# Patient Record
Sex: Female | Born: 1965 | Race: Black or African American | Hispanic: No | Smoking: Current every day smoker
Health system: Southern US, Community
[De-identification: ages and names within clinical notes are randomized; demographics above are authoritative.]

## PROBLEM LIST (undated history)

## (undated) DIAGNOSIS — Z8719 Personal history of other diseases of the digestive system: Secondary | ICD-10-CM

## (undated) DIAGNOSIS — F419 Anxiety disorder, unspecified: Secondary | ICD-10-CM

## (undated) DIAGNOSIS — M109 Gout, unspecified: Secondary | ICD-10-CM

## (undated) DIAGNOSIS — R7989 Other specified abnormal findings of blood chemistry: Secondary | ICD-10-CM

## (undated) DIAGNOSIS — Z8659 Personal history of other mental and behavioral disorders: Secondary | ICD-10-CM

## (undated) DIAGNOSIS — R768 Other specified abnormal immunological findings in serum: Secondary | ICD-10-CM

## (undated) DIAGNOSIS — K760 Fatty (change of) liver, not elsewhere classified: Secondary | ICD-10-CM

## (undated) DIAGNOSIS — D126 Benign neoplasm of colon, unspecified: Secondary | ICD-10-CM

## (undated) DIAGNOSIS — R945 Abnormal results of liver function studies: Secondary | ICD-10-CM

## (undated) DIAGNOSIS — I2510 Atherosclerotic heart disease of native coronary artery without angina pectoris: Secondary | ICD-10-CM

## (undated) DIAGNOSIS — K635 Polyp of colon: Secondary | ICD-10-CM

## (undated) DIAGNOSIS — R7689 Other specified abnormal immunological findings in serum: Secondary | ICD-10-CM

## (undated) DIAGNOSIS — I10 Essential (primary) hypertension: Secondary | ICD-10-CM

## (undated) DIAGNOSIS — R51 Headache: Secondary | ICD-10-CM

## (undated) DIAGNOSIS — E785 Hyperlipidemia, unspecified: Secondary | ICD-10-CM

## (undated) DIAGNOSIS — F32A Depression, unspecified: Secondary | ICD-10-CM

## (undated) DIAGNOSIS — Z720 Tobacco use: Secondary | ICD-10-CM

## (undated) DIAGNOSIS — F329 Major depressive disorder, single episode, unspecified: Secondary | ICD-10-CM

## (undated) DIAGNOSIS — T7840XA Allergy, unspecified, initial encounter: Secondary | ICD-10-CM

## (undated) DIAGNOSIS — K5790 Diverticulosis of intestine, part unspecified, without perforation or abscess without bleeding: Secondary | ICD-10-CM

## (undated) DIAGNOSIS — E041 Nontoxic single thyroid nodule: Secondary | ICD-10-CM

## (undated) DIAGNOSIS — D3500 Benign neoplasm of unspecified adrenal gland: Secondary | ICD-10-CM

## (undated) DIAGNOSIS — K219 Gastro-esophageal reflux disease without esophagitis: Secondary | ICD-10-CM

## (undated) HISTORY — DX: Atherosclerotic heart disease of native coronary artery without angina pectoris: I25.10

## (undated) HISTORY — DX: Nontoxic single thyroid nodule: E04.1

## (undated) HISTORY — DX: Polyp of colon: K63.5

## (undated) HISTORY — DX: Tobacco use: Z72.0

## (undated) HISTORY — DX: Hyperlipidemia, unspecified: E78.5

## (undated) HISTORY — DX: Depression, unspecified: F32.A

## (undated) HISTORY — DX: Allergy, unspecified, initial encounter: T78.40XA

## (undated) HISTORY — DX: Other specified abnormal findings of blood chemistry: R79.89

## (undated) HISTORY — DX: Gout, unspecified: M10.9

## (undated) HISTORY — DX: Diverticulosis of intestine, part unspecified, without perforation or abscess without bleeding: K57.90

## (undated) HISTORY — DX: Other specified abnormal immunological findings in serum: R76.89

## (undated) HISTORY — DX: Abnormal results of liver function studies: R94.5

## (undated) HISTORY — DX: Other specified abnormal immunological findings in serum: R76.8

## (undated) HISTORY — DX: Gastro-esophageal reflux disease without esophagitis: K21.9

## (undated) HISTORY — DX: Fatty (change of) liver, not elsewhere classified: K76.0

## (undated) HISTORY — DX: Benign neoplasm of colon, unspecified: D12.6

## (undated) HISTORY — DX: Benign neoplasm of unspecified adrenal gland: D35.00

## (undated) HISTORY — DX: Personal history of other mental and behavioral disorders: Z86.59

## (undated) HISTORY — PX: BREAST EXCISIONAL BIOPSY: SUR124

---
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
## 1985-01-19 HISTORY — PX: ANAL FISTULECTOMY: SHX1139

## 1986-01-19 HISTORY — PX: BREAST LUMPECTOMY: SHX2

## 1986-01-19 HISTORY — PX: WRIST GANGLION EXCISION: SUR520

## 1997-06-07 ENCOUNTER — Other Ambulatory Visit: Admission: RE | Admit: 1997-06-07 | Discharge: 1997-06-07 | Payer: Self-pay | Admitting: Internal Medicine

## 1998-01-01 ENCOUNTER — Other Ambulatory Visit: Admission: RE | Admit: 1998-01-01 | Discharge: 1998-01-01 | Payer: Self-pay | Admitting: Gynecology

## 1999-04-17 ENCOUNTER — Other Ambulatory Visit: Admission: RE | Admit: 1999-04-17 | Discharge: 1999-04-17 | Payer: Self-pay | Admitting: Internal Medicine

## 2000-02-25 ENCOUNTER — Other Ambulatory Visit: Admission: RE | Admit: 2000-02-25 | Discharge: 2000-02-25 | Payer: Self-pay | Admitting: Gynecology

## 2001-03-18 ENCOUNTER — Other Ambulatory Visit: Admission: RE | Admit: 2001-03-18 | Discharge: 2001-03-18 | Payer: Self-pay | Admitting: Gynecology

## 2002-03-03 ENCOUNTER — Other Ambulatory Visit: Admission: RE | Admit: 2002-03-03 | Discharge: 2002-03-03 | Payer: Self-pay | Admitting: Gynecology

## 2003-08-02 ENCOUNTER — Other Ambulatory Visit: Admission: RE | Admit: 2003-08-02 | Discharge: 2003-08-02 | Payer: Self-pay | Admitting: Gynecology

## 2005-04-22 ENCOUNTER — Other Ambulatory Visit: Admission: RE | Admit: 2005-04-22 | Discharge: 2005-04-22 | Payer: Self-pay | Admitting: Gynecology

## 2006-09-01 ENCOUNTER — Other Ambulatory Visit: Admission: RE | Admit: 2006-09-01 | Discharge: 2006-09-01 | Payer: Self-pay | Admitting: Gynecology

## 2007-03-30 ENCOUNTER — Encounter: Payer: Self-pay | Admitting: Endocrinology

## 2007-03-31 ENCOUNTER — Encounter: Admission: RE | Admit: 2007-03-31 | Discharge: 2007-03-31 | Payer: Self-pay | Admitting: Gynecology

## 2008-03-18 ENCOUNTER — Emergency Department (HOSPITAL_COMMUNITY): Admission: EM | Admit: 2008-03-18 | Discharge: 2008-03-18 | Payer: Self-pay | Admitting: Emergency Medicine

## 2008-09-21 ENCOUNTER — Encounter: Admission: RE | Admit: 2008-09-21 | Discharge: 2008-09-21 | Payer: Self-pay | Admitting: Gynecology

## 2008-12-24 ENCOUNTER — Encounter: Admission: RE | Admit: 2008-12-24 | Discharge: 2008-12-24 | Payer: Self-pay | Admitting: Endocrinology

## 2010-02-09 ENCOUNTER — Encounter: Payer: Self-pay | Admitting: Gynecology

## 2010-02-09 ENCOUNTER — Encounter: Payer: Self-pay | Admitting: Endocrinology

## 2010-02-10 ENCOUNTER — Encounter: Payer: Self-pay | Admitting: Gynecology

## 2010-05-06 LAB — POCT I-STAT, CHEM 8
BUN: 28 mg/dL — ABNORMAL HIGH (ref 6–23)
Calcium, Ion: 1.11 mmol/L — ABNORMAL LOW (ref 1.12–1.32)
Chloride: 104 mEq/L (ref 96–112)
Creatinine, Ser: 0.8 mg/dL (ref 0.4–1.2)

## 2010-05-06 LAB — CBC
HCT: 41.1 % (ref 36.0–46.0)
Hemoglobin: 13.8 g/dL (ref 12.0–15.0)
MCHC: 33.6 g/dL (ref 30.0–36.0)
MCV: 86.3 fL (ref 78.0–100.0)
RDW: 14.1 % (ref 11.5–15.5)

## 2010-05-06 LAB — POCT CARDIAC MARKERS: Troponin i, poc: <0.05 ng/mL (ref 0.00–0.09)

## 2010-05-06 LAB — DIFFERENTIAL
Basophils Absolute: 0.3 10*3/uL — ABNORMAL HIGH (ref 0.0–0.1)
Eosinophils Absolute: 0.2 10*3/uL (ref 0.0–0.7)
Eosinophils Relative: 2 % (ref 0–5)
Lymphocytes Relative: 36 % (ref 12–46)
Monocytes Absolute: 0.7 10*3/uL (ref 0.1–1.0)

## 2010-07-15 ENCOUNTER — Other Ambulatory Visit: Payer: Self-pay | Admitting: Gynecology

## 2010-07-15 DIAGNOSIS — Z1231 Encounter for screening mammogram for malignant neoplasm of breast: Secondary | ICD-10-CM

## 2010-08-27 ENCOUNTER — Ambulatory Visit
Admission: RE | Admit: 2010-08-27 | Discharge: 2010-08-27 | Disposition: A | Discharge status: Home / Self Care | Payer: 59 | Source: Ambulatory Visit | Attending: Gynecology | Admitting: Gynecology

## 2010-08-27 DIAGNOSIS — Z1231 Encounter for screening mammogram for malignant neoplasm of breast: Secondary | ICD-10-CM

## 2011-02-26 DIAGNOSIS — K118 Other diseases of salivary glands: Secondary | ICD-10-CM

## 2011-02-26 HISTORY — DX: Other diseases of salivary glands: K11.8

## 2011-03-24 ENCOUNTER — Encounter (HOSPITAL_BASED_OUTPATIENT_CLINIC_OR_DEPARTMENT_OTHER): Payer: Self-pay | Admitting: *Deleted

## 2011-03-25 ENCOUNTER — Encounter (HOSPITAL_BASED_OUTPATIENT_CLINIC_OR_DEPARTMENT_OTHER)
Admission: RE | Admit: 2011-03-25 | Discharge: 2011-03-25 | Disposition: A | Discharge status: Home / Self Care | Payer: BC Managed Care – PPO | Source: Ambulatory Visit | Attending: Otolaryngology | Admitting: Otolaryngology

## 2011-03-25 ENCOUNTER — Other Ambulatory Visit: Payer: Self-pay

## 2011-03-25 LAB — BASIC METABOLIC PANEL
CO2: 30 mEq/L (ref 19–32)
Glucose, Bld: 163 mg/dL — ABNORMAL HIGH (ref 70–99)
Potassium: 3.5 mEq/L (ref 3.5–5.1)
Sodium: 136 mEq/L (ref 135–145)

## 2011-03-25 NOTE — H&P (Signed)
  Stacy [name] is an 46 y.o. female.   Chief Complaint: Right parotid mass HPI: Right parotid mass for almost one year.  Past Medical History  Diagnosis Date  . Hypertension   . Diabetes mellitus   . Headache   . H/O hiatal hernia   . Enlarged thyroid     NO MEDS  . Anxiety     Past Surgical History  Procedure Date  . Breast surgery     LT LUMPECTOMY  . Wrist ganglion excision     LT    History reviewed. No pertinent family history. Social History:  reports that she has been smoking.  She does not have any smokeless tobacco history on file. She reports that she drinks alcohol. She reports that she does not use illicit drugs.  Allergies:  Allergies  Allergen Reactions  . Aspirin Rash    No current facility-administered medications on file as of .   Medications Prior to Admission  Medication Sig Dispense Refill  . metFORMIN (GLUCOPHAGE) 500 MG tablet Take 500 mg by mouth 2 (two) times daily with a meal.      . omeprazole (PRILOSEC) 10 MG capsule Take 10 mg by mouth daily.      . valsartan-hydrochlorothiazide (DIOVAN-HCT) 160-12.5 MG per tablet Take 1 tablet by mouth daily.        No results found for this or any previous visit (from the past 48 hour(s)). No results found.  ROS: otherwise negative  Height 5\' 3"  (1.6 m), weight 71.215 kg (157 lb).  PHYSICAL EXAM: Overall appearance:  Healthy appearing, in no distress Head:  Normocephalic, atraumatic. Ears: External auditory canals are clear; tympanic membranes are intact and the middle ears are free of any effusion. Nose: External nose is healthy in appearance. Internal nasal exam free of any lesions or obstruction. Oral Cavity:  There are no mucosal lesions or masses identified. Oral Pharynx/Hypopharynx/Larynx: no signs of any mucosal lesions or masses identified. Neuro:  No identifiable neurologic deficits. Neck: No palpable neck masses. Right parotid mass rubbery and approximately 2 cm.  Studies Reviewed:  none    Assessment/Plan Right parotid mass, recommend parotidectomy.  [name] 03/25/2011, 9:43 AM

## 2011-03-26 ENCOUNTER — Ambulatory Visit (INDEPENDENT_AMBULATORY_CARE_PROVIDER_SITE_OTHER): Payer: BC Managed Care – PPO | Admitting: Cardiology

## 2011-03-26 ENCOUNTER — Encounter: Payer: Self-pay | Admitting: Cardiology

## 2011-03-26 DIAGNOSIS — Z01818 Encounter for other preprocedural examination: Secondary | ICD-10-CM | POA: Insufficient documentation

## 2011-03-26 DIAGNOSIS — F17200 Nicotine dependence, unspecified, uncomplicated: Secondary | ICD-10-CM

## 2011-03-26 DIAGNOSIS — I10 Essential (primary) hypertension: Secondary | ICD-10-CM

## 2011-03-26 DIAGNOSIS — Z720 Tobacco use: Secondary | ICD-10-CM

## 2011-03-26 DIAGNOSIS — E785 Hyperlipidemia, unspecified: Secondary | ICD-10-CM | POA: Insufficient documentation

## 2011-03-26 NOTE — Patient Instructions (Signed)
The current medical regimen is effective;  continue present plan and medications.  Your physician has requested that you have an exercise tolerance test. For further information please visit [email or web address]. Please also follow instruction sheet, as given.   

## 2011-03-26 NOTE — Assessment & Plan Note (Signed)
I deferred her primary provider with a goal LDL less than 100 and HDL greater than 40.

## 2011-03-26 NOTE — Assessment & Plan Note (Signed)
The blood pressure is at target. No change in medications is indicated. We will continue with therapeutic lifestyle changes (TLC).  

## 2011-03-26 NOTE — Progress Notes (Signed)
Dr Ivin Booty reviewed ekg. Would like Dr Pollyann Kennedy notified and pt have cardiology clear for surg.  Spoke with Dr Lucky Rathke nurse Jasmine December.  Advised of situation Jasmine December will notify him in am.  Dr Pollyann Kennedy was not in office at this time.

## 2011-03-26 NOTE — Assessment & Plan Note (Signed)
The patient has a normal EKG. However, she does have some shortness of breath and occasional neck discomfort with emotional stress.  I will bring the patient back for a POET (Plain Old Exercise Test). This will allow me to screen for obstructive coronary disease, risk stratify and very importantly provide a prescription for exercise.  This I can do tomorrow to serve as preoperative screening as well.

## 2011-03-26 NOTE — Progress Notes (Signed)
HPI The patient presents for evaluation of an abnormal EKG. She is scheduled to have a parotidectomy. However, prior to this an EKG demonstrated poor anterior R wave progression and an old anteroseptal infarct could not be excluded. The patient does have significant cardiovascular risk factors. She has not had prior cardiovascular testing other than a distant exercise treadmill test for some atypical pain years ago. She does not describe symptoms such as chest discomfort, arm discomfort. She does not have PND or orthopnea. She does not have palpitations, presyncope or syncope.  She does get dyspneic with activities such as climbing a flight of stairs.  She also mentions that she has occasional right sided neck pain with emotional stress when this is at its peak.  She says she recovers quickly. She's not overly the and does not exercise routinely. She does smoke cigarettes.   Allergies  Allergen Reactions  . Aspirin Rash    Current Outpatient Prescriptions  Medication Sig Dispense Refill  . metFORMIN (GLUCOPHAGE) 500 MG tablet Take 500 mg by mouth 2 (two) times daily with a meal.      . omeprazole (PRILOSEC) 10 MG capsule Take 10 mg by mouth daily.      . valsartan-hydrochlorothiazide (DIOVAN-HCT) 320-25 MG per tablet Take 1 tablet by mouth daily.        Past Medical History  Diagnosis Date  . Hypertension   . Diabetes mellitus   . Headache   . H/O hiatal hernia   . Enlarged thyroid     NO MEDS  . Anxiety   . Hyperlipemia     Past Surgical History  Procedure Date  . Breast surgery     LT LUMPECTOMY  . Wrist ganglion excision     LT    Family History  Problem Relation Age of Onset  . Coronary artery disease Father 52  . Hypertension Mother   . Diabetes Sister     History   Social History  . Marital Status: Single    Spouse Name: N/A    Number of Children: 2  . Years of Education: N/A   Occupational History  .     Social History Main Topics  . Smoking status:  Current Everyday Smoker -- 1.0 packs/day for 28 years    Types: Cigarettes  . Smokeless tobacco: Not on file  . Alcohol Use: Yes     SOCIAL  . Drug Use: No  . Sexually Active: Yes    Birth Control/ Protection: Post-menopausal   Other Topics Concern  . Not on file   Social History Narrative  . No narrative on file   ROS:  Positive for reflux, varicose veins. Otherwise as stated in the HPI and negative for all other systems.  PHYSICAL EXAM BP 123/98  Pulse 101  Ht 5\' 3"  (1.6 m)  Wt 154 lb (69.854 kg)  BMI 27.28 kg/m2 GENERAL:  Well appearing HEENT:  Pupils equal round and reactive, fundi not visualized, oral mucosa unremarkable NECK:  No jugular venous distention, waveform within normal limits, carotid upstroke brisk and symmetric, no bruits, no thyromegaly LYMPHATICS:  No cervical, inguinal adenopathy LUNGS:  Clear to auscultation bilaterally BACK:  No CVA tenderness CHEST:  Unremarkable HEART:  PMI not displaced or sustained,S1 and S2 within normal limits, no S3, no S4, no clicks, no rubs, no murmurs ABD:  Flat, positive bowel sounds normal in frequency in pitch, no bruits, no rebound, no guarding, no midline pulsatile mass, no hepatomegaly, no splenomegaly EXT:  2  plus pulses throughout, no edema, no cyanosis no clubbing SKIN:  No rashes no nodules NEURO:  Cranial nerves II through XII grossly intact, motor grossly intact throughout PSYCH:  Cognitively intact, oriented to person place and time  EKG:  Sinus rhythm, rate 83, axis within normal limits, intervals within normal limits, no acute ST-T wave changes.  ASSESSMENT AND PLAN

## 2011-03-26 NOTE — Progress Notes (Signed)
Spoke with Dr Pollyann Kennedy this am.  Pt is having card visit, he will notify if case needs to be cancelled.

## 2011-03-26 NOTE — Assessment & Plan Note (Addendum)
We discussed this at great length.  I suggest Chantix for which she already has a prescription.  We discussed a specific strategy for tobacco cessation.  (Greater than three minutes discussing tobacco cessation.)

## 2011-03-27 ENCOUNTER — Ambulatory Visit (INDEPENDENT_AMBULATORY_CARE_PROVIDER_SITE_OTHER): Payer: BC Managed Care – PPO | Admitting: Cardiology

## 2011-03-27 DIAGNOSIS — R079 Chest pain, unspecified: Secondary | ICD-10-CM

## 2011-03-27 NOTE — Progress Notes (Signed)
Exercise Treadmill Test  Pre-Exercise Testing Evaluation Rhythm: sinus tachycardia  Rate: 114   PR:  .14 QRS:  .07  QT:  .31 QTc: .43     Test  Exercise Tolerance Test Ordering MD: Angelina Sheriff, MD  Interpreting MD:  Angelina Sheriff, MD  Unique Test No: 1  Treadmill:  1  Indication for ETT: chest pain - rule out ischemia  Contraindication to ETT: No   Stress Modality: exercise - treadmill  Cardiac Imaging Performed: non   Protocol: standard Bruce - maximal  Max BP:  151/88  Max MPHR (bpm):  174 85% MPR (bpm):  147  MPHR obtained (bpm):  173 % MPHR obtained:  88  Reached 85% MPHR (min:sec):  3:15 Total Exercise Time (min-sec):  7:30  Workload in METS:  9.3 Borg Scale: 17  Reason ETT Terminated:  desired heart rate attained    ST Segment Analysis At Rest: normal ST segments - no evidence of significant ST depression With Exercise: no evidence of significant ST depression  Other Information Arrhythmia:  No Angina during ETT:  absent (0) Quality of ETT:  diagnostic  ETT Interpretation:  normal - no evidence of ischemia by ST analysis  Comments: The patient had an moderate exercise tolerance.  There was no chest pain.  There was an appropriate level of dyspnea.  There were no arrhythmias, a normal heart rate response and normal BP response.  There were no ischemic ST T wave changes.  She did not have a normal heart rate recovery.   Recommendations: Negative adequate ETT.  No further testing is indicated.  Based on the above I gave the patient a prescription for exercise.  Based on ACC/AHA guidelines, the patient would be at acceptable risk for the planned procedure without further cardiovascular testing.

## 2011-03-30 ENCOUNTER — Encounter (HOSPITAL_BASED_OUTPATIENT_CLINIC_OR_DEPARTMENT_OTHER): Payer: Self-pay | Admitting: Anesthesiology

## 2011-03-30 ENCOUNTER — Encounter (HOSPITAL_BASED_OUTPATIENT_CLINIC_OR_DEPARTMENT_OTHER)
Admission: RE | Disposition: A | Discharge status: Home / Self Care | Payer: Self-pay | Source: Ambulatory Visit | Attending: Otolaryngology

## 2011-03-30 ENCOUNTER — Ambulatory Visit (HOSPITAL_BASED_OUTPATIENT_CLINIC_OR_DEPARTMENT_OTHER)
Admission: RE | Admit: 2011-03-30 | Discharge: 2011-03-31 | Disposition: A | Discharge status: Home / Self Care | Payer: BC Managed Care – PPO | Source: Ambulatory Visit | Attending: Otolaryngology | Admitting: Otolaryngology

## 2011-03-30 ENCOUNTER — Ambulatory Visit (HOSPITAL_BASED_OUTPATIENT_CLINIC_OR_DEPARTMENT_OTHER): Payer: BC Managed Care – PPO | Admitting: Anesthesiology

## 2011-03-30 ENCOUNTER — Encounter (HOSPITAL_BASED_OUTPATIENT_CLINIC_OR_DEPARTMENT_OTHER): Payer: Self-pay | Admitting: *Deleted

## 2011-03-30 DIAGNOSIS — F17200 Nicotine dependence, unspecified, uncomplicated: Secondary | ICD-10-CM | POA: Insufficient documentation

## 2011-03-30 DIAGNOSIS — K219 Gastro-esophageal reflux disease without esophagitis: Secondary | ICD-10-CM | POA: Insufficient documentation

## 2011-03-30 DIAGNOSIS — D119 Benign neoplasm of major salivary gland, unspecified: Secondary | ICD-10-CM | POA: Insufficient documentation

## 2011-03-30 DIAGNOSIS — F411 Generalized anxiety disorder: Secondary | ICD-10-CM | POA: Insufficient documentation

## 2011-03-30 DIAGNOSIS — E119 Type 2 diabetes mellitus without complications: Secondary | ICD-10-CM | POA: Insufficient documentation

## 2011-03-30 DIAGNOSIS — K118 Other diseases of salivary glands: Secondary | ICD-10-CM

## 2011-03-30 DIAGNOSIS — Z01812 Encounter for preprocedural laboratory examination: Secondary | ICD-10-CM | POA: Insufficient documentation

## 2011-03-30 DIAGNOSIS — R51 Headache: Secondary | ICD-10-CM | POA: Insufficient documentation

## 2011-03-30 DIAGNOSIS — I10 Essential (primary) hypertension: Secondary | ICD-10-CM | POA: Insufficient documentation

## 2011-03-30 HISTORY — DX: Personal history of other diseases of the digestive system: Z87.19

## 2011-03-30 HISTORY — DX: Anxiety disorder, unspecified: F41.9

## 2011-03-30 HISTORY — PX: PAROTIDECTOMY: SHX2163

## 2011-03-30 HISTORY — DX: Headache: R51

## 2011-03-30 HISTORY — DX: Essential (primary) hypertension: I10

## 2011-03-30 LAB — GLUCOSE, CAPILLARY: Glucose-Capillary: 157 mg/dL — ABNORMAL HIGH (ref 70–99)

## 2011-03-30 SURGERY — EXCISION, PAROTID GLAND
Anesthesia: General | Site: Face | Laterality: Right | Wound class: Clean

## 2011-03-30 MED ORDER — PROPOFOL 10 MG/ML IV EMUL
INTRAVENOUS | Status: DC | PRN
  Administered 2011-03-30: 50 mg via INTRAVENOUS
  Administered 2011-03-30: 200 mg via INTRAVENOUS

## 2011-03-30 MED ORDER — VALSARTAN-HYDROCHLOROTHIAZIDE 160-12.5 MG PO TABS: 1.0000 | ORAL_TABLET | Freq: Every day | ORAL | Status: DC

## 2011-03-30 MED ORDER — ALPRAZOLAM 0.25 MG PO TABS
0.2500 mg | ORAL_TABLET | Freq: Once | ORAL | Status: AC
  Administered 2011-03-30: 0.25 mg via ORAL

## 2011-03-30 MED ORDER — PROMETHAZINE HCL 25 MG RE SUPP: 25.0000 mg | Freq: Four times a day (QID) | RECTAL | Status: AC | PRN

## 2011-03-30 MED ORDER — PANTOPRAZOLE SODIUM 40 MG PO TBEC: 40.0000 mg | DELAYED_RELEASE_TABLET | Freq: Every day | ORAL | Status: DC

## 2011-03-30 MED ORDER — ACETAMINOPHEN-CODEINE #3 300-30 MG PO TABS
1.0000 | ORAL_TABLET | ORAL | Status: DC | PRN
  Administered 2011-03-30 – 2011-03-31 (×5): 2 via ORAL

## 2011-03-30 MED ORDER — GLYCOPYRROLATE 0.2 MG/ML IJ SOLN
INTRAMUSCULAR | Status: DC | PRN
  Administered 2011-03-30: 0.2 mg via INTRAVENOUS

## 2011-03-30 MED ORDER — DEXAMETHASONE SODIUM PHOSPHATE 4 MG/ML IJ SOLN
INTRAMUSCULAR | Status: DC | PRN
  Administered 2011-03-30: 10 mg via INTRAVENOUS

## 2011-03-30 MED ORDER — MIDAZOLAM HCL 5 MG/5ML IJ SOLN
INTRAMUSCULAR | Status: DC | PRN
  Administered 2011-03-30: 2 mg via INTRAVENOUS

## 2011-03-30 MED ORDER — CEFAZOLIN SODIUM 1-5 GM-% IV SOLN
1.0000 g | INTRAVENOUS | Status: AC
  Administered 2011-03-30: 1 g via INTRAVENOUS

## 2011-03-30 MED ORDER — LACTATED RINGERS IV SOLN: INTRAVENOUS | Status: DC

## 2011-03-30 MED ORDER — PROMETHAZINE HCL 25 MG RE SUPP: 25.0000 mg | Freq: Four times a day (QID) | RECTAL | Status: DC | PRN

## 2011-03-30 MED ORDER — VALSARTAN-HYDROCHLOROTHIAZIDE 320-25 MG PO TABS: 1.0000 | ORAL_TABLET | Freq: Every day | ORAL | Status: DC

## 2011-03-30 MED ORDER — HYDROCODONE-ACETAMINOPHEN 7.5-500 MG PO TABS: 1.0000 | ORAL_TABLET | Freq: Four times a day (QID) | ORAL | Status: AC | PRN

## 2011-03-30 MED ORDER — PROMETHAZINE HCL 25 MG PO TABS: 25.0000 mg | ORAL_TABLET | Freq: Four times a day (QID) | ORAL | Status: DC | PRN

## 2011-03-30 MED ORDER — LIDOCAINE-EPINEPHRINE 1 %-1:100000 IJ SOLN
INTRAMUSCULAR | Status: DC | PRN
  Administered 2011-03-30: 3 mL

## 2011-03-30 MED ORDER — LIDOCAINE HCL (CARDIAC) 20 MG/ML IV SOLN
INTRAVENOUS | Status: DC | PRN
  Administered 2011-03-30: 30 mg via INTRAVENOUS

## 2011-03-30 MED ORDER — ONDANSETRON HCL 4 MG/2ML IJ SOLN
INTRAMUSCULAR | Status: DC | PRN
  Administered 2011-03-30: 4 mg via INTRAVENOUS

## 2011-03-30 MED ORDER — METFORMIN HCL 500 MG PO TABS
500.0000 mg | ORAL_TABLET | Freq: Two times a day (BID) | ORAL | Status: DC
  Administered 2011-03-30: 500 mg via ORAL

## 2011-03-30 MED ORDER — HYDROMORPHONE HCL PF 1 MG/ML IJ SOLN
0.2500 mg | INTRAMUSCULAR | Status: DC | PRN
  Administered 2011-03-30 (×3): 0.25 mg via INTRAVENOUS

## 2011-03-30 MED ORDER — SODIUM CHLORIDE 0.9 % IV SOLN
INTRAVENOUS | Status: DC
  Administered 2011-03-30 – 2011-03-31 (×2): via INTRAVENOUS

## 2011-03-30 MED ORDER — FENTANYL CITRATE 0.05 MG/ML IJ SOLN
INTRAMUSCULAR | Status: DC | PRN
  Administered 2011-03-30 (×5): 25 ug via INTRAVENOUS
  Administered 2011-03-30: 100 ug via INTRAVENOUS
  Administered 2011-03-30 (×2): 25 ug via INTRAVENOUS

## 2011-03-30 MED ORDER — SUCCINYLCHOLINE CHLORIDE 20 MG/ML IJ SOLN
INTRAMUSCULAR | Status: DC | PRN
  Administered 2011-03-30: 100 mg via INTRAVENOUS

## 2011-03-30 MED ORDER — LACTATED RINGERS IV SOLN
INTRAVENOUS | Status: DC
  Administered 2011-03-30: 20 mL/h via INTRAVENOUS
  Administered 2011-03-30 (×3): via INTRAVENOUS

## 2011-03-30 SURGICAL SUPPLY — 65 items
ADH SKN CLS APL DERMABOND .7 (GAUZE/BANDAGES/DRESSINGS) ×1
APL SKNCLS STERI-STRIP NONHPOA (GAUZE/BANDAGES/DRESSINGS)
ATTRACTOMAT 16X20 MAGNETIC DRP (DRAPES) ×1 IMPLANT
BENZOIN TINCTURE PRP APPL 2/3 (GAUZE/BANDAGES/DRESSINGS) IMPLANT
BLADE SURG 15 STRL LF DISP TIS (BLADE) ×1 IMPLANT
BLADE SURG 15 STRL SS (BLADE) ×2
CANISTER SUCTION 1200CC (MISCELLANEOUS) ×2 IMPLANT
CLEANER CAUTERY TIP 5X5 PAD (MISCELLANEOUS) ×1 IMPLANT
CLOTH BEACON ORANGE TIMEOUT ST (SAFETY) ×2 IMPLANT
CORDS BIPOLAR (ELECTRODE) ×2 IMPLANT
COVER MAYO STAND STRL (DRAPES) ×2 IMPLANT
COVER TABLE BACK 60X90 (DRAPES) ×2 IMPLANT
DERMABOND ADVANCED (GAUZE/BANDAGES/DRESSINGS) ×1
DERMABOND ADVANCED .7 DNX12 (GAUZE/BANDAGES/DRESSINGS) IMPLANT
DRAIN JACKSON RD 7FR 3/32 (WOUND CARE) ×1 IMPLANT
DRAIN TLS ROUND 10FR (DRAIN) IMPLANT
DRAPE INCISE 23X17 IOBAN STRL (DRAPES) ×1
DRAPE INCISE 23X17 STRL (DRAPES) ×1 IMPLANT
DRAPE INCISE IOBAN 23X17 STRL (DRAPES) ×1 IMPLANT
DRAPE U-SHAPE 76X120 STRL (DRAPES) ×2 IMPLANT
ELECT COATED BLADE 2.86 ST (ELECTRODE) ×2 IMPLANT
ELECT REM PT RETURN 9FT ADLT (ELECTROSURGICAL) ×2
ELECTRODE REM PT RTRN 9FT ADLT (ELECTROSURGICAL) ×1 IMPLANT
EVACUATOR SILICONE 100CC (DRAIN) ×1 IMPLANT
GAUZE SPONGE 4X4 12PLY STRL LF (GAUZE/BANDAGES/DRESSINGS) IMPLANT
GAUZE SPONGE 4X4 16PLY XRAY LF (GAUZE/BANDAGES/DRESSINGS) ×1 IMPLANT
GLOVE BIO SURGEON STRL SZ 6.5 (GLOVE) ×2 IMPLANT
GLOVE BIO SURGEON STRL SZ7 (GLOVE) ×1 IMPLANT
GLOVE BIO SURGEON STRL SZ7.5 (GLOVE) IMPLANT
GLOVE BIOGEL PI IND STRL 8 (GLOVE) IMPLANT
GLOVE BIOGEL PI INDICATOR 8 (GLOVE)
GLOVE ECLIPSE 7.5 STRL STRAW (GLOVE) ×2 IMPLANT
GLOVE ECLIPSE 8.0 STRL XLNG CF (GLOVE) IMPLANT
GLOVE SS BIOGEL STRL SZ 7.5 (GLOVE) IMPLANT
GLOVE SUPERSENSE BIOGEL SZ 7.5 (GLOVE)
GOWN PREVENTION PLUS XLARGE (GOWN DISPOSABLE) ×3 IMPLANT
GOWN PREVENTION PLUS XXLARGE (GOWN DISPOSABLE) IMPLANT
LOCATOR NERVE 3 VOLT (DISPOSABLE) ×1 IMPLANT
NDL HYPO 25X1 1.5 SAFETY (NEEDLE) IMPLANT
NEEDLE 27GAX1X1/2 (NEEDLE) ×1 IMPLANT
NEEDLE HYPO 25X1 1.5 SAFETY (NEEDLE) IMPLANT
NS IRRIG 1000ML POUR BTL (IV SOLUTION) ×2 IMPLANT
PACK BASIN DAY SURGERY FS (CUSTOM PROCEDURE TRAY) ×2 IMPLANT
PAD CLEANER CAUTERY TIP 5X5 (MISCELLANEOUS) ×1
PENCIL FOOT CONTROL (ELECTRODE) ×2 IMPLANT
PIN SAFETY STERILE (MISCELLANEOUS) ×1 IMPLANT
SHEARS HARMONIC 9CM CVD (BLADE) ×2 IMPLANT
SHEET MEDIUM DRAPE 40X70 STRL (DRAPES) IMPLANT
SLEEVE SCD COMPRESS KNEE MED (MISCELLANEOUS) ×1 IMPLANT
STAPLER VISISTAT 35W (STAPLE) IMPLANT
STRIP CLOSURE SKIN 1/2X4 (GAUZE/BANDAGES/DRESSINGS) IMPLANT
SUCTION FRAZIER TIP 10 FR DISP (SUCTIONS) IMPLANT
SUT CHROMIC 3 0 PS 2 (SUTURE) ×2 IMPLANT
SUT ETHILON 3 0 PS 1 (SUTURE) ×2 IMPLANT
SUT ETHILON 5 0 P 3 18 (SUTURE)
SUT NYLON ETHILON 5-0 P-3 1X18 (SUTURE) IMPLANT
SUT PLAIN 5 0 P 3 18 (SUTURE) IMPLANT
SUT SILK 3 0 PS 1 (SUTURE) ×2 IMPLANT
SUT SILK 3 0 SH CR/8 (SUTURE) IMPLANT
SUT SILK 4 0 TIES 17X18 (SUTURE) ×2 IMPLANT
SYR BULB 3OZ (MISCELLANEOUS) ×2 IMPLANT
SYR CONTROL 10ML LL (SYRINGE) ×1 IMPLANT
TOWEL OR 17X24 6PK STRL BLUE (TOWEL DISPOSABLE) ×3 IMPLANT
TUBE CONNECTING 20X1/4 (TUBING) ×2 IMPLANT
WATER STERILE IRR 1000ML POUR (IV SOLUTION) ×1 IMPLANT

## 2011-03-30 NOTE — Transfer of Care (Signed)
Immediate Anesthesia Transfer of Care Note  Patient: Stacy [name]  Procedure(s) Performed: Procedure(s) (LRB): PAROTIDECTOMY (Right)  Patient Location: PACU  Anesthesia Type: General  Level of Consciousness: awake  Airway & Oxygen Therapy: Patient Spontanous Breathing and Patient connected to face mask oxygen  Post-op Assessment: Report given to PACU RN and Post -op Vital signs reviewed and stable  Post vital signs: Reviewed and stable  Complications: No apparent anesthesia complications

## 2011-03-30 NOTE — Anesthesia Procedure Notes (Addendum)
Performed by: Gar Gibbon   Procedure Name: Intubation Date/Time: 03/30/2011 9:25 AM Performed by: Gar Gibbon Pre-anesthesia Checklist: Patient identified, Emergency Drugs available, Suction available and Patient being monitored Patient Re-evaluated:Patient Re-evaluated prior to inductionOxygen Delivery Method: Circle system utilized Preoxygenation: Pre-oxygenation with 100% oxygen Intubation Type: IV induction and Cricoid Pressure applied Ventilation: Mask ventilation without difficulty Laryngoscope Size: Miller and 2 Grade View: Grade II Tube type: Oral Rae Tube size: 7.0 mm Number of attempts: 1 Placement Confirmation: ETT inserted through vocal cords under direct vision,  positive ETCO2 and breath sounds checked- equal and bilateral Secured at: 22 cm Tube secured with: Tape Dental Injury: Teeth and Oropharynx as per pre-operative assessment

## 2011-03-30 NOTE — Op Note (Signed)
OPERATIVE REPORT  DATE OF SURGERY: 03/30/2011  PATIENT:  Stacy [name],  46 y.o. female  PRE-OPERATIVE DIAGNOSIS:  Right parotid mass  POST-OPERATIVE DIAGNOSIS:  Right parotid mass  PROCEDURE:  Procedure(s): PAROTIDECTOMY  SURGEON:  Susy Frizzle, MD  ASSISTANTS: Aquilla Hacker, PA   ANESTHESIA:   general  EBL:  30 ml  DRAINS: (7 [place]) Jackson-Pratt drain(s) with closed bulb suction in the right parotid bed   LOCAL MEDICATIONS USED:  XYLOCAINE   SPECIMEN:  Source of Specimen:  right parotid  COUNTS:  YES  PROCEDURE DETAILS: Patient was taken to the operating room and placed on the operating table in the supine position. Following induction of general endotracheal anesthesia, the right face was prepped and draped in a standard fashion. A marking pen was used to outline the proposed incision in the preauricular crease with some extension around the back of the earlobe, mastoid tip and down anteriorly/obliquely towards the lower neck. The incision was injected with Xylocaine/epinephrine solution. A 15 scalpel was used to incise the skin. Attempts were made to try to identify the preauricular nerve but was unsuccessful. The anterior skin flap was raised in a superficial plane. The earlobe was dissected posteriorly and reflected posteriorly using a silk suture. The parotid tail was dissected anteriorly off of the upper sternocleidomastoid muscle, and then continued deeper dissection exposed the posterior belly of the digastric muscle. The main trunk of the facial nerve was identified just superior to the digastric. A McCabe dissector was used to follow the main trunk towards the pes anserinus using the harmonic forceps to divide the glandular tissue. The tumor was identified just superior to the main trunk. A complete facial nerve dissection was then accomplished using McCabe dissector and the harmonic forceps. All branches were identified from the upper and lower division. The specimen  was dissected free of the plane of the nerve and delivered from the wound. This was sent for pathologic evaluation. The specimen was partially transected using a scalpel to expose the tumor which was deep within the gland. The wound was irrigated with saline. Hemostasis was completed using bipolar cautery. A facial nerve stimulator was used on the main trunk and the upper and lower divisions and there was good stimulation present. The wound was closed in layers using 0 chromic in the subcuticular plane and Dermabond on the skin. Prior to closure, #7 [place] round J-P drain was left in the wound and exited to the inferior aspect of the incision, secured in place with a nylon suture. Patient was awakened, extubated and transferred to recovery in stable condition.   PATIENT DISPOSITION:  PACU - hemodynamically stable.

## 2011-03-30 NOTE — Anesthesia Preprocedure Evaluation (Addendum)
Anesthesia Evaluation  Patient identified by MRN, date of birth, ID band Patient awake    Reviewed: Allergy & Precautions, H&P , NPO status , Patient's Chart, lab work & pertinent test results  History of Anesthesia Complications Negative for: history of anesthetic complications  Airway Mallampati: I TM Distance: >3 FB Neck ROM: Full    Dental No notable dental hx. (+) Teeth Intact and Dental Advisory Given   Pulmonary Current Smoker,  breath sounds clear to auscultation  Pulmonary exam normal       Cardiovascular hypertension, Pt. on medications Rhythm:Regular Rate:Normal  Stress test last week (Hochrein): normal   Neuro/Psych  Headaches, Anxiety negative neurological ROS     GI/Hepatic Neg liver ROS, hiatal hernia, GERD-  Poorly Controlled,  Endo/Other  Diabetes mellitus- (glu 157 this am), Type 2, Oral Hypoglycemic Agents  Renal/GU negative Renal ROS     Musculoskeletal negative musculoskeletal ROS (+)   Abdominal (+) + obese,   Peds  Hematology negative hematology ROS (+)   Anesthesia Other Findings   Reproductive/Obstetrics                         Anesthesia Physical Anesthesia Plan  ASA: II  Anesthesia Plan: General   Post-op Pain Management:    Induction: Intravenous and Rapid sequence  Airway Management Planned: Oral ETT  Additional Equipment:   Intra-op Plan:   Post-operative Plan: Extubation in OR  Informed Consent: I have reviewed the patients History and Physical, chart, labs and discussed the procedure including the risks, benefits and alternatives for the proposed anesthesia with the patient or authorized representative who has indicated his/her understanding and acceptance.   Dental advisory given and Only emergency history available  Plan Discussed with: CRNA and Surgeon  Anesthesia Plan Comments: (Plan routine monitors, GETA)        Anesthesia Quick  Evaluation

## 2011-03-30 NOTE — Progress Notes (Signed)
Day of Surgery  Subjective: Doing well.  Tolerating po's.  Feels anxious.  Did not improve much with Xanax.  Blood sugar found to be in 300s.  Has taken her Glucophage dose and stopped drinking Pepsi.  Objective: Vital signs in last 24 hours: Temp:  [97.5 F (36.4 C)-98.8 F (37.1 C)] 97.8 F (36.6 C) (03/11 1956) Pulse Rate:  [84-117] 84  (03/11 1956) Resp:  [13-22] 16  (03/11 1956) BP: (108-138)/(65-98) 118/65 mmHg (03/11 1956) SpO2:  [89 %-100 %] 97 % (03/11 1615)    Intake/Output from previous day:   Intake/Output this shift: Total I/O In: 315 [P.O.:300; Other:15] Out: 465 [Urine:450; Drains:15]  General appearance: alert, cooperative and no distress Head: Right facial movements mildly weak.  Good eye closure. Neck: Right neck incision clean and intact.  No fluid collection.  Drain functioning.  Lab Results:   Basename 03/30/11 0817  WBC --  HGB 15.0  HCT --  PLT --   BMET No results found for this basename: NA:2,K:2,CL:2,CO2:2,GLUCOSE:2,BUN:2,CREATININE:2,CALCIUM:2 in the last 72 hours PT/INR No results found for this basename: LABPROT:2,INR:2 in the last 72 hours ABG No results found for this basename: PHART:2,PCO2:2,PO2:2,HCO3:2 in the last 72 hours  Studies/Results: No results found.  Anti-infectives: Anti-infectives     Start     Dose/Rate Route Frequency Ordered Stop   03/30/11 0800   ceFAZolin (ANCEF) IVPB 1 g/50 mL premix        1 g 100 mL/hr over 30 Minutes Intravenous 60 min pre-op 03/30/11 0749 03/30/11 0910          Assessment/Plan: s/p Procedure(s) (LRB): PAROTIDECTOMY (Right) Looks good from surgical standpoint.  Discussed transient weakness.  Will recheck blood sugar later this evening.  Continue drain.  LOS: 0 days    Fidencio Duddy 03/30/2011

## 2011-03-30 NOTE — Anesthesia Postprocedure Evaluation (Signed)
  Anesthesia Post-op Note  Patient: Stacy [name]  Procedure(s) Performed: Procedure(s) (LRB): PAROTIDECTOMY (Right)  Patient Location: PACU  Anesthesia Type: General  Level of Consciousness: awake, alert  and oriented  Airway and Oxygen Therapy: Patient Spontanous Breathing  Post-op Pain: none  Post-op Assessment: Post-op Vital signs reviewed, Patient's Cardiovascular Status Stable, Respiratory Function Stable, Patent Airway, No signs of Nausea or vomiting, Adequate PO intake and Pain level controlled  Post-op Vital Signs: Reviewed and stable  Complications: No apparent anesthesia complications

## 2011-03-30 NOTE — Progress Notes (Signed)
Patient returned to bed after voiding in bathroom. States that she still feels anxious/shakey. Blood glucose check 326 with repeat test of 332. When questioned patient states she had eaten ice cream and just finished drinking a large Pepsi. Family member to bring metforman now.

## 2011-03-30 NOTE — Interval H&P Note (Signed)
History and Physical Interval Note:  03/30/2011 8:54 AM  Stacy [name]  has presented today for surgery, with the diagnosis of right parotid mass  The various methods of treatment have been discussed with the patient and family. After consideration of risks, benefits and other options for treatment, the patient has consented to  Procedure(s) (LRB): PAROTIDECTOMY (Right) as a surgical intervention .  The patients' history has been reviewed, patient examined, no change in status, stable for surgery.  I have reviewed the patients' chart and labs.  Questions were answered to the patient's satisfaction.     Macio Kissoon

## 2011-03-31 MED ORDER — INSULIN ASPART 100 UNIT/ML ~~LOC~~ SOLN
1.0000 [IU] | Freq: Three times a day (TID) | SUBCUTANEOUS | Status: DC
  Administered 2011-03-30: 7 [IU] via SUBCUTANEOUS

## 2011-03-31 NOTE — Discharge Instructions (Signed)
You may get the incision wet with soap and water; but, do not use any oil or cream. Call office today to make appointment to see Dr Pollyann Kennedy in one week. [phone]   [facility]  [address] [place], [place] [address] [phone]

## 2011-03-31 NOTE — Discharge Summary (Signed)
  Physician Discharge Summary  Patient ID: Stacy [name] MRN: [id number] DOB/AGE: [birth year] 46 y.o.  Admit date: 03/30/2011 Discharge date: 03/31/2011  Admission Diagnoses:Parotid mass  Discharge Diagnoses:  Active Problems:  * No active hospital problems. *    Discharged Condition: good  Hospital Course: No complications  Consults: None  Significant Diagnostic Studies: none  Treatments: surgery: right superficial parotidectomy  Discharge Exam: Blood pressure 123/91, pulse 83, temperature 97.6 F (36.4 C), temperature source Oral, resp. rate 16, height 5\' 3"  (1.6 m), weight 71.215 kg (157 lb), SpO2 99.00%. PHYSICAL EXAM: Incision looks great, no swelling. Facial nerve with very minor weakness in all branches. JP removed.  Disposition: Final discharge disposition not confirmed  Discharge Orders    Future Orders Please Complete By Expires   Diet - low sodium heart healthy      Increase activity slowly        Medication List  As of 03/31/2011 10:08 AM   STOP taking these medications         valsartan-hydrochlorothiazide 160-12.5 MG per tablet         TAKE these medications         HYDROcodone-acetaminophen 7.5-500 MG per tablet   Commonly known as: LORTAB   Take 1 tablet by mouth every 6 (six) hours as needed for pain.      metFORMIN 500 MG tablet   Commonly known as: GLUCOPHAGE   Take 500 mg by mouth 2 (two) times daily with a meal.      omeprazole 10 MG capsule   Commonly known as: PRILOSEC   Take 10 mg by mouth daily.      promethazine 25 MG suppository   Commonly known as: PHENERGAN   Place 1 suppository (25 mg total) rectally every 6 (six) hours as needed for nausea.      valsartan-hydrochlorothiazide 320-25 MG per tablet   Commonly known as: DIOVAN-HCT   Take 1 tablet by mouth daily.           Follow-up Information    Follow up with Serena Colonel, MD. Call in 1 week.   Contact information:   [address], [address], [address] [place] [address] [phone]          Signed: Serena Colonel 03/31/2011, 10:08 AM

## 2011-04-01 ENCOUNTER — Encounter (HOSPITAL_BASED_OUTPATIENT_CLINIC_OR_DEPARTMENT_OTHER): Payer: Self-pay | Admitting: Otolaryngology

## 2011-05-20 ENCOUNTER — Ambulatory Visit: Payer: BC Managed Care – PPO | Admitting: *Deleted

## 2011-05-22 ENCOUNTER — Encounter: Payer: Self-pay | Admitting: *Deleted

## 2011-06-25 ENCOUNTER — Encounter: Payer: Self-pay | Admitting: *Deleted

## 2011-06-25 ENCOUNTER — Encounter: Payer: BC Managed Care – PPO | Attending: Family Medicine | Admitting: *Deleted

## 2011-06-25 DIAGNOSIS — E119 Type 2 diabetes mellitus without complications: Secondary | ICD-10-CM | POA: Insufficient documentation

## 2011-06-25 DIAGNOSIS — Z713 Dietary counseling and surveillance: Secondary | ICD-10-CM | POA: Insufficient documentation

## 2011-06-25 NOTE — Progress Notes (Signed)
  Medical Nutrition Therapy:  Appt start time: 0800 end time:  0900.   Assessment:  Primary concerns today: diabetes.   MEDICATIONS: see list   DIETARY INTAKE:  Usual eating pattern includes 2-3 meals and 1 snacks per day.  Everyday foods include fast food, restaurants, high fat meats.  Avoided foods include none.    24-hr recall:  B ( AM): normally skips; would get ham or sausage biscuit sometimes from fast food; occasionally eats yogurt; sometimes coffee. Drinks 2% milk  Snk ( AM): none  L ( PM): eats out country restaurant: meat and 2 vegetables with bread. Water; sometimes burger and fries or pizza Snk ( PM): skinny cow frozen treat D ( PM): mostly fried meats, 2 vegetables, bread; may have casserole. 2% milk Snk ( PM): none Beverages: water and 2% milk  Usual physical activity: none  Estimated energy needs: 1500 calories 170 g carbohydrates 112 g protein 42 g fat  Progress Towards Goal(s):  In progress.   Nutritional Diagnosis:  NB-1.1 Food and nutrition-related knowledge deficit related to carbohydrate-containing foods.  As evidenced by HgA1C of 8%.    Intervention:  Nutrition counseling provided.  Patient admits to confusion about diabetic diet.  Also has high cholesterol and high blood pressure.  Discussed carb counting, reading food labels, limiting saturated fats, and sodium.  Also discussed physical activity.  Mentioned effects of smoking on diabetes.  Handouts given during visit include: Carb Counting and Food Label handouts Meal Plan Card  Goals:  Follow Diabetes Meal Plan as instructed  Eat 3 meals and 2 snacks, every 3-5 hrs  Limit carbohydrate intake to 30-45 grams carbohydrate/meal (2-3 choices)  Limit carbohydrate intake to 15 grams carbohydrate/snack (1 carb choice)  Add lean protein foods to meals/snacks  Monitor glucose levels daily and record values and times  Try 1% milk  Limit fried meats.  Try more grilled, broiled, braised,  roasted  Aim for 30 mins of physical activity daily  Bring food record and glucose log to your next nutrition visit  Look for Calorie Brooke Dare book to use when eating out  Limit instant foods for sake of sodium  Limit saturated fats for sake of cholesterol  Monitoring/Evaluation:  Dietary intake, exercise, reading food labels, and body weight prn.  Will further discuss reductions in smoking and alcohol use.

## 2011-06-25 NOTE — Patient Instructions (Addendum)
Goals:  Follow Diabetes Meal Plan as instructed  Eat 3 meals and 2 snacks, every 3-5 hrs  Limit carbohydrate intake to 30-45 grams carbohydrate/meal (2-3 choices)  Limit carbohydrate intake to 15 grams carbohydrate/snack (1 carb choice)  Add lean protein foods to meals/snacks  Monitor glucose levels daily and record values and times  Try 1% milk  Limit fried meats.  Try more grilled, broiled, braised, roasted  Aim for 30 mins of physical activity daily  Bring food record and glucose log to your next nutrition visit  Look for Calorie Brooke Dare book to use when eating out  Limit instant foods for sake of sodium  Limit saturated fats for sake of cholesterol

## 2011-08-14 ENCOUNTER — Other Ambulatory Visit: Payer: Self-pay | Admitting: Family Medicine

## 2011-08-14 DIAGNOSIS — R222 Localized swelling, mass and lump, trunk: Secondary | ICD-10-CM

## 2011-08-18 ENCOUNTER — Other Ambulatory Visit: Payer: BC Managed Care – PPO

## 2012-03-02 ENCOUNTER — Other Ambulatory Visit: Payer: Self-pay | Admitting: Gynecology

## 2012-07-25 ENCOUNTER — Encounter: Payer: Self-pay | Admitting: Physician Assistant

## 2012-08-29 ENCOUNTER — Other Ambulatory Visit: Payer: Self-pay | Admitting: Family Medicine

## 2012-09-02 ENCOUNTER — Telehealth: Payer: Self-pay | Admitting: Physician Assistant

## 2012-09-02 ENCOUNTER — Encounter: Payer: Self-pay | Admitting: Family Medicine

## 2012-09-02 MED ORDER — VALSARTAN-HYDROCHLOROTHIAZIDE 320-25 MG PO TABS: 1.0000 | ORAL_TABLET | Freq: Every day | ORAL | Status: DC

## 2012-09-02 NOTE — Telephone Encounter (Signed)
Medication refill for one time only.  Patient needs to be seen.  Letter sent for patient to call and schedule 

## 2012-09-02 NOTE — Telephone Encounter (Signed)
Valsartan-HCTZ 320-25 mg tab 1 QD #30  (pt was seeing Dr. Modesto Charon, but she did not transfer)

## 2012-09-18 ENCOUNTER — Other Ambulatory Visit: Payer: Self-pay | Admitting: Family Medicine

## 2012-09-22 ENCOUNTER — Telehealth: Payer: Self-pay | Admitting: Physician Assistant

## 2012-09-22 NOTE — Telephone Encounter (Signed)
Atorvastatin 40 mg tab 1 QD #30

## 2012-09-23 ENCOUNTER — Encounter: Payer: Self-pay | Admitting: Family Medicine

## 2012-09-23 NOTE — Telephone Encounter (Signed)
Refill denied.  Pt has been already notified appt needed and no appt has been made.  Another letter sent must be seen

## 2012-10-13 ENCOUNTER — Ambulatory Visit (INDEPENDENT_AMBULATORY_CARE_PROVIDER_SITE_OTHER): Payer: BC Managed Care – PPO | Admitting: Physician Assistant

## 2012-10-13 ENCOUNTER — Encounter: Payer: Self-pay | Admitting: Physician Assistant

## 2012-10-13 VITALS — BP 106/70 | HR 100 | Temp 98.2°F | Resp 18 | Ht 62.0 in | Wt 149.0 lb

## 2012-10-13 DIAGNOSIS — F411 Generalized anxiety disorder: Secondary | ICD-10-CM

## 2012-10-13 DIAGNOSIS — F418 Other specified anxiety disorders: Secondary | ICD-10-CM | POA: Insufficient documentation

## 2012-10-13 DIAGNOSIS — K219 Gastro-esophageal reflux disease without esophagitis: Secondary | ICD-10-CM | POA: Insufficient documentation

## 2012-10-13 DIAGNOSIS — Z0000 Encounter for general adult medical examination without abnormal findings: Secondary | ICD-10-CM

## 2012-10-13 DIAGNOSIS — F17200 Nicotine dependence, unspecified, uncomplicated: Secondary | ICD-10-CM

## 2012-10-13 DIAGNOSIS — R52 Pain, unspecified: Secondary | ICD-10-CM

## 2012-10-13 DIAGNOSIS — M109 Gout, unspecified: Secondary | ICD-10-CM | POA: Insufficient documentation

## 2012-10-13 DIAGNOSIS — F419 Anxiety disorder, unspecified: Secondary | ICD-10-CM

## 2012-10-13 DIAGNOSIS — R220 Localized swelling, mass and lump, head: Secondary | ICD-10-CM

## 2012-10-13 DIAGNOSIS — E785 Hyperlipidemia, unspecified: Secondary | ICD-10-CM

## 2012-10-13 DIAGNOSIS — R221 Localized swelling, mass and lump, neck: Secondary | ICD-10-CM

## 2012-10-13 DIAGNOSIS — E119 Type 2 diabetes mellitus without complications: Secondary | ICD-10-CM

## 2012-10-13 DIAGNOSIS — I10 Essential (primary) hypertension: Secondary | ICD-10-CM

## 2012-10-13 DIAGNOSIS — Z720 Tobacco use: Secondary | ICD-10-CM

## 2012-10-13 MED ORDER — PIOGLITAZONE HCL 30 MG PO TABS: 30.0000 mg | ORAL_TABLET | Freq: Every day | ORAL | Status: DC

## 2012-10-13 NOTE — Progress Notes (Signed)
Patient ID: Stacy [name] MRN: [id number], DOB: 05-23-1965, 47 y.o. Date of Encounter: @DATE @  Chief Complaint:  Chief Complaint  Patient presents with  . Annual Exam    no PAP had at GYN    HPI: 47 y.o. year old AA female  presents for complete physical exam. As well she has a list of things to discuss today.  She had been seeing Dr. Lottie Dawson here at our office. Her first visit here at this office with him was July 2012. Her last visit here was July 2013.  She says that she has been seeing Dr. Nicholas Lose her gynecologist. Says that he is actually the one that first diagnosed and treated her diabetes. She just had a GYN exam with Dr. Nicholas Lose and April of this year. Has been having routine mammograms with him.  She just had labs done as part of her wellness exam at work. She brought and a copy of that report.  She reports that she uses her Xanax as needed for anxiety. Says that she only needs these approximately 3 times per month. Says that Dr. Nicholas Lose had been prescribing them. States that he is retiring so she may be calling here to get the refills.  She reports that she has had diarrhea ever since starting metformin. She says it healed when she has cut this down to taking just 1 tablet per day she still has diarrhea.  She has noticed that the area above her right clavicle is enlarged compared to the same area on the left. Wants this evaluated.  She is concerned because she has recently noticed pains in her feet ankles and knees bilaterally. Says that in just a couple weeks ago she developed pain and swelling on the dorsum of her left foot. States that she had had no trauma or injury and no reason for this to happen but for some reason had pain and swelling there. Then after within just a few days she had problems with her right ankle. As well for the last several weeks she has been noticing a deep pain in both of her knees. Says that when she has been sitting at work and stands she is noticing  significant pain there. She wants to have some test to check for things like lupus and other systemic illnesses. Does not give her written order for these that she can have these done through walk without charge.  Also is concerned because she says that she has significant reflux no matter what type of food she eats. In the past she is able to take an over-the-counter Prilosec and her symptoms would resolve. Now she is having to take 2 pills daily. As well sometimes food seems to get stuck in her esophagus. She is concerned that she has some aberrant underlying abnormality and would like to have further evaluation.  She also requests to have a chest x-ray. She is a smoker and has been having daily cough. Says that she realizes the cough may just be secondary to this making itself but would like to have chest x-ray to check things.   Past Medical History  Diagnosis Date  . Hypertension   . Headache(784.0)   . H/O hiatal hernia   . Enlarged thyroid     NO MEDS  . Hyperlipemia   . Tobacco abuse   . Anxiety   . Diabetes mellitus   . GERD (gastroesophageal reflux disease)   . Gout      Home Meds: See attached medication  section for current medication list. Any medications entered into computer today will not appear on this note's list. The medications listed below were entered prior to today. Current Outpatient Prescriptions on File Prior to Visit  Medication Sig Dispense Refill  . atorvastatin (LIPITOR) 40 MG tablet Take 40 mg by mouth at bedtime.       Marland Kitchen omeprazole (PRILOSEC) 10 MG capsule Take 10 mg by mouth daily.      . valsartan-hydrochlorothiazide (DIOVAN-HCT) 320-25 MG per tablet Take 1 tablet by mouth daily.  30 tablet  0   No current facility-administered medications on file prior to visit.    Allergies:  Allergies  Allergen Reactions  . Aspirin Nausea Only    History   Social History  . Marital Status: Single    Spouse Name: N/A    Number of Children: 2  . Years of  Education: N/A   Occupational History  .     Social History Main Topics  . Smoking status: Current Every Day Smoker -- 1.00 packs/day for 28 years    Types: Cigarettes  . Smokeless tobacco: Not on file  . Alcohol Use: Yes     Comment: SOCIAL  . Drug Use: No  . Sexual Activity: Yes    Birth Control/ Protection: Post-menopausal   Other Topics Concern  . Not on file   Social History Narrative   Married. 2 children.    Ages 47, 2 y/o ( entered 09/2012)      Works with [employer]    Family History  Problem Relation Age of Onset  . Coronary artery disease Father 66  . Hypertension Mother   . Diabetes Sister   . Cancer Sister 31    uterine cancer     Review of Systems:  See HPI for pertinent ROS. All other ROS negative.    Physical Exam: Blood pressure 106/70, pulse 100, temperature 98.2 F (36.8 C), temperature source Oral, resp. rate 18, height 5\' 2"  (1.575 m), weight 149 lb (67.586 kg)., Body mass index is 27.25 kg/(m^2). General: Well-nourished well-developed AA female. Appears in no acute distress. Head: Normocephalic, atraumatic, eyes without discharge, sclera non-icteric, nares are without discharge. Bilateral auditory canals clear, TM's are without perforation, pearly grey and translucent with reflective cone of light bilaterally. Oral cavity moist, posterior pharynx without exudate, erythema, peritonsillar abscess, or post nasal drip.  Neck: Supple. No thyromegaly. No lymphadenopathy. No carotid bruit. Just superior to her right supraclavicular area there is at approximate 1 inch area of fullness. It is not a firm mass. However compared to the same area on the left side, there is no symmetrical fullness on the left. Lungs: Clear bilaterally to auscultation without wheezes, rales, or rhonchi. Breathing is unlabored. Heart: RRR with S1 S2. No murmurs, rubs, or gallops. Abdomen: Soft, non-tender, non-distended with normoactive bowel sounds. No  hepatomegaly. No rebound/guarding. No obvious abdominal masses. Musculoskeletal:  Strength and tone normal for age. Extremities/Skin: Warm and dry. No clubbing or cyanosis. No edema. No rashes or suspicious lesions. Neuro: Alert and oriented X 3. Moves all extremities spontaneously. Gait is normal. CNII-XII grossly in tact. Psych:  Responds to questions appropriately with a normal affect.     ASSESSMENT AND PLAN:  47 y.o. year old female with  1. Visit for preventive health examination  A. Screening labs: She just recently had these performed at her work on 09/20/2012. She brought in a copy. We'll have them scanned in .  Total cholesterol 194, HDL  55, triglyceride 354, LDL 68.               CMET is normal except for glucose 143.  CBC is normal  Hemoglobin A1c 7.8.  Vitamin B12 normal at 1034  TSH and free T4 are normal.  Vitamin D normal at 30  B. breast exam mammogram pelvic exam Pap smears are all done by her GYN doctor [place].  2. Diabetes 09/20/12 hemoglobin A1c was 7.8. However patient tells me that during the several months prior to that lab she was not taking the metformin as directed. As well see history of present illness regarding diarrhea with metformin Stop metformin. Change to Actos 30 mg . Discussed need for regular office visit every 3 months to follow this up. - pioglitazone (ACTOS) 30 MG tablet; Take 1 tablet (30 mg total) by mouth daily.  Dispense: 30 tablet; Refill: 3  3. Hypertension At goal. Continue current medication. He met normal as above.  4. Hyperlipemia LDL at goal as above. Continue current medication. LFTs are normal.  5. Tobacco abuse She says her husband use Chantix and had adverse effects . She defers use of Chantix . She will continue to work on cessation on her own. - DG Chest 2 View; Future  6. GERD (gastroesophageal reflux disease) - Ambulatory referral to Gastroenterology  7. Anxiety Continue Xanax as needed. She says she only uses about 3  per month on average.  8. Palpable mass of neck This area is actually in the right supraclavicular area. Exam seems consistent with possible lymph node. Will obtain ultrasound to further differentiate. - US Soft Tissue Head/Neck; Future  9. Pain of multiple sites I gave her an order to have labs done through her work per her request. Will check arthritis panel, ANA, rheumatoid factor, uric acid. Results are to be faxed to me  She has not been having routine followup office visits here every 3 months. Discussed need for diabetic followup in 3 months.   Murray Hodgkins [place], [place], [facility] 10/13/2012 4:06 PM

## 2012-10-17 ENCOUNTER — Other Ambulatory Visit: Payer: BC Managed Care – PPO

## 2012-10-18 ENCOUNTER — Encounter: Payer: Self-pay | Admitting: Physician Assistant

## 2012-10-20 ENCOUNTER — Other Ambulatory Visit: Payer: Self-pay | Admitting: Physician Assistant

## 2012-10-20 NOTE — Telephone Encounter (Signed)
CPE 9/25.    BP med refilled x 6 mths.  One month refill Alprazolam called in.

## 2012-10-21 NOTE — Telephone Encounter (Signed)
Approved.  

## 2012-12-13 ENCOUNTER — Encounter: Payer: Self-pay | Admitting: Internal Medicine

## 2012-12-14 ENCOUNTER — Encounter: Payer: Self-pay | Admitting: Internal Medicine

## 2012-12-14 ENCOUNTER — Ambulatory Visit (INDEPENDENT_AMBULATORY_CARE_PROVIDER_SITE_OTHER)
Admission: RE | Admit: 2012-12-14 | Discharge: 2012-12-14 | Disposition: A | Discharge status: Home / Self Care | Payer: BC Managed Care – PPO | Source: Ambulatory Visit | Attending: Internal Medicine | Admitting: Internal Medicine

## 2012-12-14 ENCOUNTER — Ambulatory Visit (INDEPENDENT_AMBULATORY_CARE_PROVIDER_SITE_OTHER): Payer: BC Managed Care – PPO | Admitting: Internal Medicine

## 2012-12-14 VITALS — BP 130/76 | HR 88 | Ht 62.0 in | Wt 149.0 lb

## 2012-12-14 DIAGNOSIS — R079 Chest pain, unspecified: Secondary | ICD-10-CM

## 2012-12-14 DIAGNOSIS — Z1211 Encounter for screening for malignant neoplasm of colon: Secondary | ICD-10-CM

## 2012-12-14 DIAGNOSIS — R05 Cough: Secondary | ICD-10-CM

## 2012-12-14 DIAGNOSIS — K219 Gastro-esophageal reflux disease without esophagitis: Secondary | ICD-10-CM

## 2012-12-14 MED ORDER — PANTOPRAZOLE SODIUM 40 MG PO TBEC: 40.0000 mg | DELAYED_RELEASE_TABLET | Freq: Every day | ORAL | Status: DC

## 2012-12-14 MED ORDER — MOVIPREP 100 G PO SOLR: ORAL | Status: DC

## 2012-12-14 NOTE — Patient Instructions (Signed)
You have been scheduled for a colonoscopy/Endoscopy with propofol. Please follow written instructions given to you at your visit today.  Please pick up your prep kit at the pharmacy within the next 1-3 days. If you use inhalers (even only as needed), please bring them with you on the day of your procedure. Your physician has requested that you go to www.startemmi.com and enter the access code given to you at your visit today. This web site gives a general overview about your procedure. However, you should still follow specific instructions given to you by our office regarding your preparation for the procedure.  We have sent the following medications to your pharmacy for you to pick up at your convenience: Pantoprazole, take 1 capsule daily Discontinue taking Omeprazole.  Moviprep; you were given instructions today at your office visit.  Please go down to the basement to radiology for a Chest X-ray

## 2012-12-14 NOTE — Progress Notes (Signed)
Patient ID: Stacy [name], female   DOB: 1966-01-03, 47 y.o.   MRN: [id number] HPI: Stacy [name] is a 47 yo female with PMH of hypertension, diabetes, hyperlipidemia, tobacco use who is seen in consultation at the request of Frazier Richards, PA-C to evaluate worsening reflux and loose stools. She is here alone today. She reports her last year she's had trouble with acid reflux and indigestion which tends to be worsening. She also reports when she is drinking liquids she feels like something is "blocking" her swallowing. This seems to come and go. She also endorses occasional odynophagia to liquids. Solids seem to go down okay. She feels burning in her chest which at times radiates to her right shoulder. She also reports sour brash. She has noticed this worse with sleeping. She is also reporting a cough which is nonproductive. No nausea or vomiting. She takes omeprazole over-the-counter sporadically which does help but doesn't seem to last long. She also uses TUMS on occasion. Bowel movements have been irregular and very from loose to soft and formed. She occasionally sees black and dark stool. She noticed that her bowels became more loose with metformin. She started metformin about a year ago. No bright red blood per rectum. She occasionally has lower abdominal crampy pain preceding defecation which resolves afterwards  No family history of colorectal cancer, IBD or other GI tract malignancy  Past Medical History  Diagnosis Date  . Hypertension   . Headache(784.0)   . H/O hiatal hernia   . Enlarged thyroid     NO MEDS  . Hyperlipemia   . Tobacco abuse   . Anxiety   . Diabetes mellitus   . GERD (gastroesophageal reflux disease)   . Gout     Past Surgical History  Procedure Laterality Date  . Breast surgery      LT LUMPECTOMY  . Wrist ganglion excision      LT  . Parotidectomy  03/30/2011    Procedure: PAROTIDECTOMY;  Surgeon: Serena Colonel, MD;  Location: [facility];  Service:  ENT;  Laterality: Right;  . Anal fistulectomy  01/19/1985    Current Outpatient Prescriptions  Medication Sig Dispense Refill  . ALPRAZolam (XANAX) 0.5 MG tablet TAKE 1 TABLET EVERY DAY FOR ANXIETY(NO ALCOHOL)  30 tablet  0  . atorvastatin (LIPITOR) 40 MG tablet Take 40 mg by mouth at bedtime.       . colchicine (COLCRYS) 0.6 MG tablet Take 0.6 mg by mouth 2 (two) times daily as needed.      Marland Kitchen ibuprofen (ADVIL,MOTRIN) 200 MG tablet Take 800 mg by mouth 2 (two) times daily.      . metFORMIN (GLUCOPHAGE) 500 MG tablet Take 500 mg by mouth 2 (two) times daily with a meal.      . Multiple Vitamins-Minerals (ONE-A-DAY WOMENS VITACRAVES) CHEW Chew 1 each by mouth daily.      . valsartan-hydrochlorothiazide (DIOVAN-HCT) 320-25 MG per tablet TAKE 1 TABLET BY MOUTH DAILY.  30 tablet  6  . MOVIPREP 100 G SOLR Use per prep instruction  1 kit  0  . pantoprazole (PROTONIX) 40 MG tablet Take 1 tablet (40 mg total) by mouth daily.  90 tablet  3   No current facility-administered medications for this visit.    Allergies  Allergen Reactions  . Aspirin Nausea Only    Family History  Problem Relation Age of Onset  . Coronary artery disease Father 54  . Hypertension Mother   . Diabetes Sister   .  Cancer Sister 36    uterine cancer    History  Substance Use Topics  . Smoking status: Current Every Day Smoker -- 1.00 packs/day for 28 years    Types: Cigarettes  . Smokeless tobacco: Not on file  . Alcohol Use: Yes     Comment: SOCIAL    ROS: As per history of present illness, otherwise negative  BP 130/76  Pulse 88  Ht 5\' 2"  (1.575 m)  Wt 149 lb (67.586 kg)  BMI 27.25 kg/m2 Constitutional: Well-developed and well-nourished. No distress. HEENT: Normocephalic and atraumatic. Oropharynx is clear and moist. No oropharyngeal exudate. Conjunctivae are normal.  No scleral icterus. Neck: Neck supple. Trachea midline. Cardiovascular: Normal rate, regular rhythm and intact distal pulses. No  M/R/G Pulmonary/chest: Effort normal and breath sounds normal. No wheezing, rales or rhonchi. Abdominal: Soft, mild epigastric tenderness, no rebound or guarding, nondistended. Bowel sounds active throughout. There are no masses palpable.  Extremities: no clubbing, cyanosis, or edema Neurological: Alert and oriented to person place and time. Skin: Skin is warm and dry. No rashes noted. Psychiatric: Normal mood and affect. Behavior is normal.  Labs dated 09/20/2012 Comprehensive metabolic panel within normal limits except for glucose elevation 143 Hemoglobin 13.2, hematocrit 40.7, platelet count 261, Osmon count 10.3 A1c 7.8  ASSESSMENT/PLAN:  47 yo female with PMH of hypertension, diabetes, hyperlipidemia, tobacco use who is seen in consultation at the request of Frazier Richards, PA-C to evaluate worsening reflux and loose stools.  1. Heartburn/indigestion/odynophagia -- she does need diagnostic criteria for GERD however given her odynophagia and dyspepsia, I have recommended upper endoscopy. We discussed the test today including the risks and benefits and she is agreeable to proceed. I will start her on pantoprazole 40 mg daily. She was asked to take this 30 minutes before her first meal of the day. She can use over-the-counter Pepcid per box instructions at night for breakthrough symptoms as needed.  2.  CRC screening/change in bowel habits -- most likely metformin related. We will arrange for screening colonoscopy to be performed on the same day as her upper endoscopy. She agrees to the test after a thorough discussion of risks and benefits  3.  Smoking -- cessation encouraged. Cough likely related to smoking but possibly reflux related as well. PA and lateral chest x-ray today

## 2012-12-19 ENCOUNTER — Encounter: Payer: Self-pay | Admitting: Internal Medicine

## 2013-01-16 ENCOUNTER — Ambulatory Visit (AMBULATORY_SURGERY_CENTER): Payer: BC Managed Care – PPO | Admitting: Internal Medicine

## 2013-01-16 ENCOUNTER — Encounter: Payer: Self-pay | Admitting: Internal Medicine

## 2013-01-16 VITALS — BP 112/78 | HR 75 | Temp 98.3°F | Resp 19 | Ht 62.0 in | Wt 149.0 lb

## 2013-01-16 DIAGNOSIS — D126 Benign neoplasm of colon, unspecified: Secondary | ICD-10-CM

## 2013-01-16 DIAGNOSIS — K219 Gastro-esophageal reflux disease without esophagitis: Secondary | ICD-10-CM

## 2013-01-16 DIAGNOSIS — Z1211 Encounter for screening for malignant neoplasm of colon: Secondary | ICD-10-CM

## 2013-01-16 LAB — GLUCOSE, CAPILLARY: Glucose-Capillary: 144 mg/dL — ABNORMAL HIGH (ref 70–99)

## 2013-01-16 MED ORDER — SODIUM CHLORIDE 0.9 % IV SOLN: 500.0000 mL | INTRAVENOUS | Status: DC

## 2013-01-16 NOTE — Op Note (Signed)
[facility] [address] [place], [address]   ENDOSCOPY PROCEDURE REPORT  PATIENT: Stacy [name], Stacy [name]  MR#: [id number] BIRTHDATE: [birth year] , 47  yrs. old GENDER: Female ENDOSCOPIST: Beverley Fiedler, MD REFERRED BY:  Frazier Richards, PA-C PROCEDURE DATE:  01/16/2013 PROCEDURE:  EGD, diagnostic ASA CLASS:     Class II INDICATIONS:  history of GERD.   Odynophagia. MEDICATIONS: MAC sedation, administered by CRNA and propofol (Diprivan) 200mg  IV TOPICAL ANESTHETIC: none  DESCRIPTION OF PROCEDURE: After the risks benefits and alternatives of the procedure were thoroughly explained, informed consent was obtained.  The LB [id number] endoscope was introduced through the mouth and advanced to the second portion of the duodenum. Without limitations.  The instrument was slowly withdrawn as the mucosa was fully examined.     ESOPHAGUS: The mucosa of the esophagus appeared normal.   A 5 cm hiatal hernia was noted.   Possible, early, non-obstructing and widely patent Schatzki's ring.  STOMACH: The mucosa of the stomach appeared normal.  DUODENUM: The duodenal mucosa showed no abnormalities in the bulb and second portion of the duodenum.  Retroflexed views revealed a hiatal hernia.     The scope was then withdrawn from the patient and the procedure completed.  COMPLICATIONS: There were no complications.  ENDOSCOPIC IMPRESSION: 1.   The mucosa of the esophagus appeared normal 2.   5 cm hiatal hernia 3.   The mucosa of the stomach appeared normal 4.   The duodenal mucosa showed no abnormalities in the bulb and second portion of the duodenum  RECOMMENDATIONS: 1.  Continue taking your PPI (antiacid medicine) once daily.  It is best to be taken 20-30 minutes prior to breakfast meal. 2.  Office follow-up as needed  eSigned:  Beverley Fiedler, MD 01/16/2013 9:43 AM CC:The Patient and Frazier Richards MD

## 2013-01-16 NOTE — Op Note (Signed)
[facility] [address] [place], [address]   COLONOSCOPY PROCEDURE REPORT  PATIENT: Stacy, [name]  MR#: [id number] BIRTHDATE: [birth year] , 47  yrs. old GENDER: Female ENDOSCOPIST: Beverley Fiedler, MD REFERRED XB:JYNW Dionicio Stall, PA-C PROCEDURE DATE:  01/16/2013 PROCEDURE:   Colonoscopy with cold biopsy polypectomy First Screening Colonoscopy - Avg.  risk and is 50 yrs.  old or older Yes.  Prior Negative Screening - Now for repeat screening. N/A  History of Adenoma - Now for follow-up colonoscopy & has been > or = to 3 yrs.  N/A  Polyps Removed Today? Yes. ASA CLASS:   Class II INDICATIONS:average risk screening and Change in bowel habits. MEDICATIONS: MAC sedation, administered by CRNA and Propofol (Diprivan)  DESCRIPTION OF PROCEDURE:   After the risks benefits and alternatives of the procedure were thoroughly explained, informed consent was obtained.  A digital rectal exam revealed no rectal mass.   The LB [id number]  endoscope was introduced through the anus and advanced to the cecum, which was identified by both the appendix and ileocecal valve. No adverse events experienced. The quality of the prep was good, using MoviPrep  The instrument was then slowly withdrawn as the colon was fully examined.   COLON FINDINGS: Four diminutive (2-4 mm) sessile polyps were found in the transverse colon, sigmoid colon, and rectosigmoid colon (2). Polypectomy was performed with cold forceps.  All resections were complete and all polyp tissue was completely retrieved.   There was very mild scattered diverticulosis noted in the ascending colon and sigmoid colon.   The colon mucosa was otherwise normal. Retroflexed views revealed no abnormalities. The time to cecum=5 minutes 07 seconds.  Withdrawal time=12 minutes 14 seconds.  The scope was withdrawn and the procedure completed. COMPLICATIONS: There were no complications.  ENDOSCOPIC IMPRESSION: 1.   Four  diminutive sessile polyps were found in the transverse colon, sigmoid colon, and rectosigmoid colon; Polypectomy was performed with cold forceps 2.   There was mild diverticulosis noted in the ascending colon and sigmoid colon 3.   The colon mucosa was otherwise normal  RECOMMENDATIONS: 1.  Await pathology results 2.  High fiber diet 3.  Timing of repeat colonoscopy will be determined by pathology findings. 4.  You will receive a letter within 1-2 weeks with the results of your biopsy as well as final recommendations.  Please call my office if you have not received a letter after 3 weeks.   eSigned:  Beverley Fiedler, MD 01/16/2013 9:47 AM        cc: The Patient and Frazier Richards MD

## 2013-01-16 NOTE — Patient Instructions (Addendum)

## 2013-01-16 NOTE — Progress Notes (Signed)
Pt stable, suctioned well after EGD, episodes of snoring during Colonoscopy, no difficulties - LB

## 2013-01-16 NOTE — Progress Notes (Signed)
Called to room to assist during endoscopic procedure.  Patient ID and intended procedure confirmed with present staff. Received instructions for my participation in the procedure from the performing physician.  

## 2013-01-17 ENCOUNTER — Telehealth: Payer: Self-pay

## 2013-01-17 NOTE — Telephone Encounter (Signed)
No answer, left message to call [facility] if questions or concerns following procedure 01/16/2013 

## 2013-01-22 ENCOUNTER — Encounter: Payer: Self-pay | Admitting: Internal Medicine

## 2013-01-23 ENCOUNTER — Ambulatory Visit: Payer: BC Managed Care – PPO | Admitting: Physician Assistant

## 2013-07-29 ENCOUNTER — Other Ambulatory Visit: Payer: Self-pay | Admitting: *Deleted

## 2013-07-29 DIAGNOSIS — E785 Hyperlipidemia, unspecified: Secondary | ICD-10-CM

## 2013-07-29 DIAGNOSIS — I10 Essential (primary) hypertension: Secondary | ICD-10-CM

## 2013-07-29 DIAGNOSIS — E119 Type 2 diabetes mellitus without complications: Secondary | ICD-10-CM

## 2013-08-23 ENCOUNTER — Other Ambulatory Visit: Payer: Self-pay | Admitting: *Deleted

## 2013-08-23 ENCOUNTER — Ambulatory Visit (INDEPENDENT_AMBULATORY_CARE_PROVIDER_SITE_OTHER): Payer: BC Managed Care – PPO | Admitting: Internal Medicine

## 2013-08-23 ENCOUNTER — Encounter: Payer: Self-pay | Admitting: Internal Medicine

## 2013-08-23 ENCOUNTER — Telehealth: Payer: Self-pay | Admitting: *Deleted

## 2013-08-23 VITALS — BP 125/81 | HR 91 | Temp 98.1°F | Ht 63.0 in | Wt 147.0 lb

## 2013-08-23 DIAGNOSIS — D126 Benign neoplasm of colon, unspecified: Secondary | ICD-10-CM | POA: Diagnosis not present

## 2013-08-23 DIAGNOSIS — I10 Essential (primary) hypertension: Secondary | ICD-10-CM

## 2013-08-23 DIAGNOSIS — Z7189 Other specified counseling: Secondary | ICD-10-CM

## 2013-08-23 DIAGNOSIS — F329 Major depressive disorder, single episode, unspecified: Secondary | ICD-10-CM | POA: Insufficient documentation

## 2013-08-23 DIAGNOSIS — Z630 Problems in relationship with spouse or partner: Secondary | ICD-10-CM | POA: Insufficient documentation

## 2013-08-23 DIAGNOSIS — Z139 Encounter for screening, unspecified: Secondary | ICD-10-CM

## 2013-08-23 DIAGNOSIS — G43909 Migraine, unspecified, not intractable, without status migrainosus: Secondary | ICD-10-CM | POA: Insufficient documentation

## 2013-08-23 DIAGNOSIS — F4321 Adjustment disorder with depressed mood: Secondary | ICD-10-CM | POA: Diagnosis not present

## 2013-08-23 DIAGNOSIS — R51 Headache: Secondary | ICD-10-CM

## 2013-08-23 DIAGNOSIS — E785 Hyperlipidemia, unspecified: Secondary | ICD-10-CM

## 2013-08-23 DIAGNOSIS — K635 Polyp of colon: Secondary | ICD-10-CM

## 2013-08-23 MED ORDER — FLUOXETINE HCL 20 MG PO CAPS: 20.0000 mg | ORAL_CAPSULE | Freq: Every day | ORAL | Status: DC

## 2013-08-23 MED ORDER — EPINEPHRINE 0.3 MG/0.3ML IJ SOAJ: INTRAMUSCULAR | Status: DC

## 2013-08-23 MED ORDER — ALPRAZOLAM 1 MG PO TABS: ORAL_TABLET | ORAL | Status: DC

## 2013-08-23 NOTE — Patient Instructions (Signed)
Stacy [name]  [phone]  [id number]  [phone]  Dr. Margaretmary Lombard:   [phone]  See me in 6 weeks  30 mins

## 2013-08-23 NOTE — Telephone Encounter (Signed)
Sent request to Dr

## 2013-08-23 NOTE — Telephone Encounter (Signed)
Pt called in requesting refill

## 2013-08-23 NOTE — Telephone Encounter (Signed)
Odyssey was here today and requested her BP medication be called in, but it was not.  She needs valsartan-hydrochlorothiazide (DIOVAN-HCT) 320-25 MG per tablet. TAKE 1 TABLET BY MOUTH DAILY. Send to   [id number] - [place], [place] - [address]
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
## 2013-08-24 MED ORDER — VALSARTAN-HYDROCHLOROTHIAZIDE 320-25 MG PO TABS: ORAL_TABLET | ORAL | Status: DC

## 2013-09-28 ENCOUNTER — Telehealth: Payer: Self-pay

## 2013-09-28 ENCOUNTER — Other Ambulatory Visit: Payer: Self-pay | Admitting: Internal Medicine

## 2013-09-28 ENCOUNTER — Telehealth: Payer: Self-pay | Admitting: *Deleted

## 2013-09-28 ENCOUNTER — Other Ambulatory Visit: Payer: Self-pay | Admitting: *Deleted

## 2013-09-28 MED ORDER — METFORMIN HCL 500 MG PO TABS: 500.0000 mg | ORAL_TABLET | Freq: Two times a day (BID) | ORAL | Status: DC

## 2013-09-28 NOTE — Telephone Encounter (Signed)
Stacy [name] [phone] [facility] called and she needs a refill for METFORMIN HCL 500 MG PO TABS

## 2013-09-28 NOTE — Telephone Encounter (Signed)
I spoke with Stacy [name]. She is under a lot of stress and her sugars have been running high. Her husband recently passed away from a heart attack, this has added to her stress. She is out of her metformin. We refilled the metformin today. She is going to get her fasting labs tomorrow before her next appointment so we can see how her sugars have been.

## 2013-09-28 NOTE — Telephone Encounter (Signed)
error 

## 2013-09-28 NOTE — Telephone Encounter (Signed)
Stacy [name]  See [phone] note and call pt   Talk to me after you speak to pt

## 2013-09-28 NOTE — Telephone Encounter (Signed)
Refill request for Metformin.

## 2013-09-28 NOTE — Telephone Encounter (Signed)
Stacy [name] [phone]  [name] called and would like for you to call her back, her blood sugar this morning was 255, she recently lost her husband unexpectedly, and she has some other concerns she wants to talk to you about. She has an appointment 10/03/13

## 2013-09-30 ENCOUNTER — Other Ambulatory Visit: Payer: Self-pay | Admitting: Internal Medicine

## 2013-09-30 LAB — CBC
HEMATOCRIT: 39.1 % (ref 36.0–46.0)
HEMOGLOBIN: 13.5 g/dL (ref 12.0–15.0)
MCH: 29.2 pg (ref 26.0–34.0)
MCHC: 34.5 g/dL (ref 30.0–36.0)
MCV: 84.4 fL (ref 78.0–100.0)
Platelets: 456 10*3/uL — ABNORMAL HIGH (ref 150–400)
RBC: 4.63 MIL/uL (ref 3.87–5.11)
RDW: 13.7 % (ref 11.5–15.5)
WBC: 9.9 10*3/uL (ref 4.0–10.5)

## 2013-09-30 LAB — LIPID PANEL
Cholesterol: 251 mg/dL — ABNORMAL HIGH (ref 0–200)
HDL: 40 mg/dL (ref >39)
LDL Cholesterol: 160 mg/dL — ABNORMAL HIGH (ref 0–99)
TRIGLYCERIDES: 254 mg/dL — AB (ref <150)
Total CHOL/HDL Ratio: 6.3 Ratio
VLDL: 51 mg/dL — AB (ref 0–40)

## 2013-09-30 LAB — COMPREHENSIVE METABOLIC PANEL
ALT: 54 U/L — ABNORMAL HIGH (ref 0–35)
AST: 38 U/L — ABNORMAL HIGH (ref 0–37)
Albumin: 4.2 g/dL (ref 3.5–5.2)
Alkaline Phosphatase: 74 U/L (ref 39–117)
BILIRUBIN TOTAL: 0.4 mg/dL (ref 0.2–1.2)
BUN: 29 mg/dL — AB (ref 6–23)
CO2: 27 mEq/L (ref 19–32)
Calcium: 11.2 mg/dL — ABNORMAL HIGH (ref 8.4–10.5)
Chloride: 97 mEq/L (ref 96–112)
Creat: 1.03 mg/dL (ref 0.50–1.10)
GLUCOSE: 137 mg/dL — AB (ref 70–99)
POTASSIUM: 3.9 meq/L (ref 3.5–5.3)
SODIUM: 137 meq/L (ref 135–145)
Total Protein: 7.9 g/dL (ref 6.0–8.3)

## 2013-09-30 LAB — TSH: TSH: 0.463 u[IU]/mL (ref 0.350–4.500)

## 2013-10-02 LAB — VITAMIN D 25 HYDROXY (VIT D DEFICIENCY, FRACTURES): Vit D, 25-Hydroxy: 44 ng/mL (ref 30–89)

## 2013-10-03 ENCOUNTER — Encounter: Payer: Self-pay | Admitting: Internal Medicine

## 2013-10-03 ENCOUNTER — Ambulatory Visit
Admission: RE | Admit: 2013-10-03 | Discharge: 2013-10-03 | Disposition: A | Discharge status: Home / Self Care | Payer: BC Managed Care – PPO | Source: Ambulatory Visit | Attending: Internal Medicine | Admitting: Internal Medicine

## 2013-10-03 ENCOUNTER — Telehealth: Payer: Self-pay

## 2013-10-03 ENCOUNTER — Ambulatory Visit (INDEPENDENT_AMBULATORY_CARE_PROVIDER_SITE_OTHER): Payer: BC Managed Care – PPO | Admitting: Internal Medicine

## 2013-10-03 ENCOUNTER — Ambulatory Visit: Admission: RE | Admit: 2013-10-03 | Payer: BC Managed Care – PPO | Source: Ambulatory Visit

## 2013-10-03 ENCOUNTER — Ambulatory Visit (HOSPITAL_BASED_OUTPATIENT_CLINIC_OR_DEPARTMENT_OTHER)
Admission: RE | Admit: 2013-10-03 | Discharge: 2013-10-03 | Disposition: A | Discharge status: Home / Self Care | Payer: BC Managed Care – PPO | Source: Ambulatory Visit | Attending: Internal Medicine | Admitting: Internal Medicine

## 2013-10-03 ENCOUNTER — Other Ambulatory Visit: Payer: Self-pay | Admitting: Internal Medicine

## 2013-10-03 VITALS — BP 125/89 | HR 98 | Temp 97.9°F | Resp 16 | Ht 63.0 in | Wt 142.0 lb

## 2013-10-03 DIAGNOSIS — R059 Cough, unspecified: Secondary | ICD-10-CM

## 2013-10-03 DIAGNOSIS — I10 Essential (primary) hypertension: Secondary | ICD-10-CM

## 2013-10-03 DIAGNOSIS — Z634 Disappearance and death of family member: Secondary | ICD-10-CM

## 2013-10-03 DIAGNOSIS — R053 Chronic cough: Secondary | ICD-10-CM

## 2013-10-03 DIAGNOSIS — Z1231 Encounter for screening mammogram for malignant neoplasm of breast: Secondary | ICD-10-CM

## 2013-10-03 DIAGNOSIS — Z720 Tobacco use: Secondary | ICD-10-CM

## 2013-10-03 DIAGNOSIS — R7301 Impaired fasting glucose: Secondary | ICD-10-CM

## 2013-10-03 DIAGNOSIS — E785 Hyperlipidemia, unspecified: Secondary | ICD-10-CM

## 2013-10-03 DIAGNOSIS — R05 Cough: Secondary | ICD-10-CM | POA: Diagnosis not present

## 2013-10-03 DIAGNOSIS — F17200 Nicotine dependence, unspecified, uncomplicated: Secondary | ICD-10-CM

## 2013-10-03 DIAGNOSIS — Z7189 Other specified counseling: Secondary | ICD-10-CM

## 2013-10-03 DIAGNOSIS — Z719 Counseling, unspecified: Secondary | ICD-10-CM

## 2013-10-03 LAB — GLUCOSE, POCT (MANUAL RESULT ENTRY): POC GLUCOSE: 148 mg/dL — AB (ref 70–99)

## 2013-10-03 LAB — HEMOGLOBIN A1C
Hgb A1c MFr Bld: 7.9 % — ABNORMAL HIGH (ref <5.7)
MEAN PLASMA GLUCOSE: 180 mg/dL — AB (ref <117)

## 2013-10-03 MED ORDER — ALBUTEROL SULFATE (2.5 MG/3ML) 0.083% IN NEBU: INHALATION_SOLUTION | RESPIRATORY_TRACT | Status: DC

## 2013-10-03 MED ORDER — METFORMIN HCL ER (OSM) 500 MG PO TB24: ORAL_TABLET | ORAL | Status: DC

## 2013-10-03 MED ORDER — AZITHROMYCIN 250 MG PO TABS: ORAL_TABLET | ORAL | Status: DC

## 2013-10-03 NOTE — Patient Instructions (Addendum)
30 min appt on the first week of my return   To xray today    Will set up referral to [place] therapist   Will get labs at her next visit  Stay off lipitor and colchicine for now  Start Prozac

## 2013-10-03 NOTE — Progress Notes (Signed)
Subjective:    Patient ID: Stacy [name], female    DOB: 09-12-1965, 48 y.o.   MRN: [id number]  HPI  Most recent OV: weeks  Insomnia related to above. OK for Xanax 1 mg 1/2 to One tablet at HA and 1/2 to 1/4 during the day  HTN continue meds Check labs  Hpyerlipidemia conitne lipitor  H/o gout  GERD continue Protonic  H/o migriane h/a  Colon polyp recent colonosocopy  To lab today Schedule 3d mm See me in 6 weeks            Todays';s visit  Stacy [name] is here to follow up on many issues.  Since last visit her husband had sudden death believed due to CV reasons.  Pt returned home to fine spouse unresponsive  on floor.  This happened 8/31.  She has not been back to work since then.  Having trouble sleeping at night .  Lots of history of marital stress but pt states relaationship was improving somehwhat prior to his death.. Has "good days and bad days now"  Denies daily depression .  She has not been takin her Prozac regularly   See labs she is on Metformin 500 bid but pt states it does cause GI distress and loose stools   Elevated lfts:  She did report last visit that she "drinks to kill the pain"  She tells me she will consume up to 3 "hard Liquor " drinks on a daily basis .   She does not take her lipitor daily  Hyperlipidemia  Not at goal but has not been taking statin consistantly an d she states it makes her muscles ache  She does report chronic cough that has been going on "for months"  She is an active smoker.  She report she has used a ventolin inhaler in the past   .  Cough occasionally productive yellow sputum. No fever no SOB no chest pain.   See calcium:  I do not have prior results for comparison     Review of Systems    see HPI Objective:   Physical Exam  Physical Exam  Nursing note and vitals reviewed.   Teary off and on during interview Constitutional: She is oriented to person, place, and time. She appears well-developed and well-nourished.  HENT:  Head:  Normocephalic and atraumatic.  Cardiovascular: Normal rate and regular rhythm. Exam reveals no gallop and no friction rub.  No murmur heard.  Pulmonary/Chest: Breath sounds normal. She has few end exp wheezes bilateral bases. She has no rales.  Neurological: She is alert and oriented to person, place, and time.  Skin: Skin is warm and dry.  Psychiatric: She has a normal mood and affect. Her behavior is normal.        Assessment & Plan:  HTN  Continue meds  Hyperlipidemia   Advised to stay of lipitor for now. I would like to see lfts back to normal.    Will check at next viist  DM:  Will give RX for glumetza XR to try to lessen GI SE.  To take take one 500 mg  tablet in am for 7 days then 2 tabs ( 1000 mg daily)    Check AIC , urine micral next visit   Bereavement/situational depression/anxiety  Advised to resume Prozac  Will refer to therapist [facility]    ETOH use  Advised she needs to stop her ETOH consumption.  Advised 12 step program and to attend AA meetings.  Elevated lfts  Likely due to ETOH and/or statin  Will check next visit  Elevated calcium:  Etiology unclear at this point  Will recheck  Chronic cough  Will get cxr,  RX z-pak and  RX ventolin  MDI 2 inhaltions q8h   See me in 3 weeks or to call office sooner if not better.  Pt notitified I will be out of town  For 8 work days and she knows to call office if any worsening to be seen by covering provider or urgent care.  She voices understanding   I did fill out FMLA for dates 9/1 until 10/5.  Pt informed any extension will need to be determined by her therapist.   Will also get mm today

## 2013-10-03 NOTE — Telephone Encounter (Signed)
I called ane left message for Stacy [name] to call me back. Her FMLA paperwork states to give back to employee, so I called to see if shee wants me to mail or if she would like to come by and pick up.

## 2013-10-04 DIAGNOSIS — Z720 Tobacco use: Secondary | ICD-10-CM | POA: Insufficient documentation

## 2013-10-04 DIAGNOSIS — Z634 Disappearance and death of family member: Secondary | ICD-10-CM | POA: Insufficient documentation

## 2013-10-06 ENCOUNTER — Telehealth: Payer: Self-pay | Admitting: *Deleted

## 2013-10-06 NOTE — Progress Notes (Signed)
I spoke with Stacy [name] and let her know that her CXR was normal-eh

## 2013-10-06 NOTE — Telephone Encounter (Signed)
I spoke with Stacy [name] today. She said that her albuterol inhaler was sent in as a neb solution instead of an inhaler.

## 2013-10-08 ENCOUNTER — Other Ambulatory Visit: Payer: Self-pay | Admitting: Internal Medicine

## 2013-10-08 MED ORDER — ALBUTEROL SULFATE HFA 108 (90 BASE) MCG/ACT IN AERS: INHALATION_SPRAY | RESPIRATORY_TRACT | Status: DC

## 2013-10-09 NOTE — Progress Notes (Signed)
I left Stacy [name] a message letting her know we called in her inhaler.-er

## 2013-10-19 ENCOUNTER — Ambulatory Visit: Payer: BC Managed Care – PPO | Admitting: Internal Medicine

## 2013-10-19 DIAGNOSIS — Z09 Encounter for follow-up examination after completed treatment for conditions other than malignant neoplasm: Secondary | ICD-10-CM

## 2013-10-31 ENCOUNTER — Telehealth: Payer: Self-pay

## 2013-10-31 NOTE — Telephone Encounter (Signed)
I spoke with Stacy [name] and she was a little confused about her labs. She recently had her labs drawn last month. She has a follow up appointment with [place] in a couple weeks. I advised her to keep that appointment-eh

## 2013-10-31 NOTE — Telephone Encounter (Signed)
Stacy [name] [phone] (M)   Kalaya called to day and stated that she had not received her lab orders in the mail.She stated that she was supposed to redo her labs before her next appointment on 11/15/13. She would like for [place] to mail orders to her so she may go to the lab in [facility] where she works.

## 2013-11-15 ENCOUNTER — Ambulatory Visit: Payer: BC Managed Care – PPO | Admitting: Licensed Clinical Social Worker

## 2013-11-15 ENCOUNTER — Encounter: Payer: BC Managed Care – PPO | Admitting: Internal Medicine

## 2013-11-15 NOTE — Progress Notes (Signed)
Subjective:    Patient ID: Stacy [name], female    DOB: January 15, 1966, 48 y.o.   MRN: [id number]  HPI  10/03/2013 note HTN Continue meds  Hyperlipidemia Advised to stay of lipitor for now. I would like to see lfts back to normal. Will check at next viist  DM: Will give RX for glumetza XR to try to lessen GI SE. To take take one 500 mg tablet in am for 7 days then 2 tabs ( 1000 mg daily) Check AIC , urine micral next visit  Bereavement/situational depression/anxiety Advised to resume Prozac Will refer to therapist [facility]  ETOH use Advised she needs to stop her ETOH consumption. Advised 12 step program and to attend AA meetings.  Elevated lfts Likely due to ETOH and/or statin Will check next visit  Elevated calcium: Etiology unclear at this point Will recheck  Chronic cough Will get cxr, RX z-pak and RX ventolin MDI 2 inhaltions q8h  See me in 3 weeks or to call office sooner if not better. Pt notitified I will be out of town For 8 work days and she knows to call office if any worsening to be seen by covering provider or urgent care. She voices understanding  I did fill out FMLA for dates 9/1 until 10/5. Pt informed any extension will need to be determined by her therapist.  Will also get mm today  Today  Allergies  Allergen Reactions  . Almond Meal Swelling  . Aspirin Nausea Only   Past Medical History  Diagnosis Date  . Hypertension   . Headache(784.0)   . H/O hiatal hernia   . Hyperlipemia   . Tobacco abuse   . Anxiety   . Diabetes mellitus   . GERD (gastroesophageal reflux disease)   . Gout    Past Surgical History  Procedure Laterality Date  . Breast surgery      LT LUMPECTOMY  . Wrist ganglion excision      LT  . Parotidectomy  03/30/2011    Procedure: PAROTIDECTOMY;  Surgeon: Izora Gala, MD;  Location: [facility];  Service: ENT;  Laterality: Right;  . Anal fistulectomy  01/19/1985   History   Social History  . Marital Status: Widowed    Spouse  Name: N/A    Number of Children: 2  . Years of Education: N/A   Occupational History  .     Social History Main Topics  . Smoking status: Current Every Day Smoker -- 1.00 packs/day for 28 years    Types: Cigarettes  . Smokeless tobacco: Never Used  . Alcohol Use: 7.0 oz/week    14 drink(s) per week  . Drug Use: No  . Sexual Activity: Not Currently    Birth Control/ Protection: Post-menopausal   Other Topics Concern  . Not on file   Social History Narrative   Married. 2 children.    Ages 46, 36 y/o ( entered 09/2012)      Works with [employer]   Family History  Problem Relation Age of Onset  . Coronary artery disease Father 84  . Hypertension Mother   . Diabetes Sister   . Cancer Sister 83    uterine cancer   Patient Active Problem List   Diagnosis Date Noted  . Bereavement 10/04/2013  . Tobacco use 10/04/2013  . Situational depression 08/23/2013  . Colon polyp 08/23/2013  . Headache(784.0) 08/23/2013  . Marital stress 08/23/2013  . GERD (gastroesophageal reflux disease)   .  Anxiety   . Diabetes   . Gout   . Preoperative examination 03/26/2011  . Hypertension   . Hyperlipemia    Current Outpatient Prescriptions on File Prior to Visit  Medication Sig Dispense Refill  . albuterol (PROVENTIL HFA;VENTOLIN HFA) 108 (90 BASE) MCG/ACT inhaler Inhale 2 puffs into lungs every 8 hours prn wheezing  1 Inhaler  0  . ALPRAZolam (XANAX) 1 MG tablet Take 1/2 or one whole tablet 3 times a week at hs  20 tablet  1  . atorvastatin (LIPITOR) 40 MG tablet Take 40 mg by mouth at bedtime.       Marland Kitchen azithromycin (ZITHROMAX) 250 MG tablet Take as directed .  Give generic  6 each  0  . EPINEPHrine 0.3 mg/0.3 mL IJ SOAJ injection Insert into muscle once with any facial or neck swelling  Instruct in use  1 Device  1  . FLUoxetine (PROZAC) 20 MG capsule Take 1 capsule (20 mg total) by mouth daily.  60 capsule  1  . ibuprofen (ADVIL,MOTRIN) 200 MG tablet Take 800 mg by  mouth 2 (two) times daily.      . metformin (GLUMETZA) 500 MG (OSM) 24 hr tablet Take one tablet in am with food for 7 days then 2 tablets in am daily  60 tablet  2  . Multiple Vitamins-Minerals (ONE-A-DAY WOMENS VITACRAVES) CHEW Chew 1 each by mouth daily.      . pantoprazole (PROTONIX) 40 MG tablet Take 1 tablet (40 mg total) by mouth daily.  90 tablet  3  . valsartan-hydrochlorothiazide (DIOVAN-HCT) 320-25 MG per tablet Take one daily  30 tablet  6   No current facility-administered medications on file prior to visit.      Review of Systems See hPI    Objective:   Physical Exam  Physical Exam  Nursing note and vitals reviewed.  Constitutional: She is oriented to person, place, and time. She appears well-developed and well-nourished.  HENT:  Head: Normocephalic and atraumatic.  Cardiovascular: Normal rate and regular rhythm. Exam reveals no gallop and no friction rub.  No murmur heard.  Pulmonary/Chest: Breath sounds normal. She has no wheezes. She has no rales.  Neurological: She is alert and oriented to person, place, and time.  Skin: Skin is warm and dry.  Psychiatric: She has a normal mood and affect. Her behavior is normal.             Assessment & Plan:

## 2013-11-16 ENCOUNTER — Ambulatory Visit (INDEPENDENT_AMBULATORY_CARE_PROVIDER_SITE_OTHER): Payer: BC Managed Care – PPO | Admitting: Licensed Clinical Social Worker

## 2013-11-16 DIAGNOSIS — F321 Major depressive disorder, single episode, moderate: Secondary | ICD-10-CM

## 2013-11-22 ENCOUNTER — Encounter: Payer: Self-pay | Admitting: Internal Medicine

## 2013-11-22 ENCOUNTER — Ambulatory Visit (INDEPENDENT_AMBULATORY_CARE_PROVIDER_SITE_OTHER): Payer: BC Managed Care – PPO | Admitting: Internal Medicine

## 2013-11-22 ENCOUNTER — Ambulatory Visit (INDEPENDENT_AMBULATORY_CARE_PROVIDER_SITE_OTHER): Payer: BC Managed Care – PPO | Admitting: Licensed Clinical Social Worker

## 2013-11-22 DIAGNOSIS — E089 Diabetes mellitus due to underlying condition without complications: Secondary | ICD-10-CM

## 2013-11-22 DIAGNOSIS — E785 Hyperlipidemia, unspecified: Secondary | ICD-10-CM

## 2013-11-22 DIAGNOSIS — F321 Major depressive disorder, single episode, moderate: Secondary | ICD-10-CM

## 2013-11-22 DIAGNOSIS — Z720 Tobacco use: Secondary | ICD-10-CM

## 2013-11-22 DIAGNOSIS — I10 Essential (primary) hypertension: Secondary | ICD-10-CM

## 2013-11-22 DIAGNOSIS — F1029 Alcohol dependence with unspecified alcohol-induced disorder: Secondary | ICD-10-CM

## 2013-11-22 DIAGNOSIS — F1020 Alcohol dependence, uncomplicated: Secondary | ICD-10-CM | POA: Insufficient documentation

## 2013-11-22 LAB — COMPREHENSIVE METABOLIC PANEL
ALK PHOS: 79 U/L (ref 39–117)
ALT: 44 U/L — AB (ref 0–35)
AST: 52 U/L — AB (ref 0–37)
Albumin: 4 g/dL (ref 3.5–5.2)
BILIRUBIN TOTAL: 0.6 mg/dL (ref 0.2–1.2)
BUN: 33 mg/dL — AB (ref 6–23)
CO2: 26 mEq/L (ref 19–32)
CREATININE: 1.31 mg/dL — AB (ref 0.50–1.10)
Calcium: 10.2 mg/dL (ref 8.4–10.5)
Chloride: 98 mEq/L (ref 96–112)
Glucose, Bld: 136 mg/dL — ABNORMAL HIGH (ref 70–99)
Potassium: 4.2 mEq/L (ref 3.5–5.3)
Sodium: 136 mEq/L (ref 135–145)
Total Protein: 7.7 g/dL (ref 6.0–8.3)

## 2013-11-22 MED ORDER — METFORMIN HCL ER (MOD) 500 MG PO TB24: ORAL_TABLET | ORAL | Status: DC

## 2013-11-22 NOTE — Progress Notes (Signed)
Subjective:    Patient ID: Stacy [name], female    DOB: 05/21/1965, 48 y.o.   MRN: [id number]  HPI 9/15 HTN Continue meds  Hyperlipidemia Advised to stay of lipitor for now. I would like to see lfts back to normal. Will check at next viist  DM: Will give RX for glumetza XR to try to lessen GI SE. To take take one 500 mg tablet in am for 7 days then 2 tabs ( 1000 mg daily) Check AIC , urine micral next visit   Bereavement/situational depression/anxiety Advised to resume Prozac Will refer to therapist [facility]   ETOH use Advised she needs to stop her ETOH consumption. Advised 12 step program and to attend AA meetings.   Elevated lfts Likely due to ETOH and/or statin Will check next visit  Elevated calcium: Etiology unclear at this point Will recheck  Chronic cough Will get cxr, RX z-pak and RX ventolin MDI 2 inhaltions q8h   See me in 3 weeks or to call office sooner if not better. Pt notitified I will be out of town For 8 work days and she knows to call office if any worsening to be seen by covering provider or urgent care. She voices understanding   I did fill out FMLA for dates 9/1 until 10/5. Pt informed any extension will need to be determined by her therapist.   Will also get mm today  Today  Shailyn reports FBS this am was 230.  She [facility] not [facility]  She declines  Nutrition/diabetes education despite my counsel to do so  She reports insomnia  Melatonin not working   Review of Systems See HPI    Objective:   Physical Exam Physical Exam  Nursing note and vitals reviewed.  Constitutional: She is oriented to person, place, and time. She appears well-developed and well-nourished.  HENT:  Head: Normocephalic and atraumatic.  Cardiovascular: Normal rate and regular rhythm. Exam reveals no gallop and no friction rub.  No murmur heard.  Pulmonary/Chest: Breath sounds normal. She has no wheezes. She has no  rales.  Neurological: She is alert and oriented to person, place, and time.  Skin: Skin is warm and dry.  Psychiatric: She has a normal mood and affect. Her behavior is normal.              Assessment & Plan:  DM:  She is only on one 500 mg Metformin daily.  Did not get Glumetza.  Given RX for glumetza 500 mg one tablet daily for 7 days then two tabs daily.  Check fasting and bedtime glucoses and bring next visit  Advised to see nutritionist but pt declines.    Elevated calcium  Will recheck  today with PTH  Elevated lfts   Will recheck today   ETOH dependence:  She tells me she has cut down to "one drink a day"  She is seeing [employer].  Advised to try AA.  I do not think she has stopped her relationship with alcohol   Insomnia  Ok for Benadryl one or 2 at night.   I counseled  pt as long as she is still drinking I will not add a second addictive medication   Tobacco use  Advised cessation .  Pt not ready  This is contributing to her chronic cough   Hyperlipidemia  Will get diabetes under control and hesitant to put on statin as long as she is still drinking  See  me in December

## 2013-11-23 LAB — PTH, INTACT AND CALCIUM
Calcium: 10.2 mg/dL (ref 8.4–10.5)
PTH: 66 pg/mL — ABNORMAL HIGH (ref 14–64)

## 2013-11-27 ENCOUNTER — Telehealth: Payer: Self-pay | Admitting: Internal Medicine

## 2013-11-27 NOTE — Telephone Encounter (Signed)
Left message for pt to call regarding lab results.

## 2013-11-28 ENCOUNTER — Telehealth: Payer: Self-pay | Admitting: Internal Medicine

## 2013-11-28 ENCOUNTER — Telehealth: Payer: Self-pay

## 2013-11-28 NOTE — Telephone Encounter (Signed)
Lorna Few returned your call, she said you could call her at [phone]

## 2013-11-28 NOTE — Telephone Encounter (Signed)
Called pt again regarding lab results  Left message

## 2013-11-29 ENCOUNTER — Telehealth: Payer: Self-pay | Admitting: Internal Medicine

## 2013-11-29 NOTE — Telephone Encounter (Signed)
I was given a message by my staff with pts mobile number and told she would answer as pt is off work today   [employer] [phone]  And pt unavailable for the 3rd time.    Advised to call office and to be available on her phone

## 2013-11-29 NOTE — Telephone Encounter (Signed)
Spoke with pt and informed of labs.    Minimally elevated PTH,  Will need to recheck. CA level normal on recheck   lfts still abnormal.  Advised to stop ETOH  She will see me in December   Pt voices understanding

## 2013-12-08 ENCOUNTER — Ambulatory Visit: Payer: BC Managed Care – PPO | Admitting: Licensed Clinical Social Worker

## 2013-12-10 ENCOUNTER — Other Ambulatory Visit: Payer: Self-pay | Admitting: Internal Medicine

## 2013-12-11 ENCOUNTER — Telehealth: Payer: Self-pay | Admitting: *Deleted

## 2013-12-11 NOTE — Telephone Encounter (Signed)
Refill request

## 2013-12-11 NOTE — Telephone Encounter (Signed)
I phoned in [facility] Xanax.-eh

## 2014-01-04 ENCOUNTER — Ambulatory Visit: Payer: BC Managed Care – PPO | Admitting: Internal Medicine

## 2014-01-09 ENCOUNTER — Encounter: Payer: Self-pay | Admitting: Cardiology

## 2014-01-15 ENCOUNTER — Other Ambulatory Visit: Payer: Self-pay | Admitting: Internal Medicine

## 2014-02-01 ENCOUNTER — Encounter: Payer: Self-pay | Admitting: Internal Medicine

## 2014-02-01 ENCOUNTER — Ambulatory Visit (INDEPENDENT_AMBULATORY_CARE_PROVIDER_SITE_OTHER): Payer: BLUE CROSS/BLUE SHIELD | Admitting: Internal Medicine

## 2014-02-01 ENCOUNTER — Encounter (INDEPENDENT_AMBULATORY_CARE_PROVIDER_SITE_OTHER): Payer: Self-pay

## 2014-02-01 ENCOUNTER — Other Ambulatory Visit: Payer: Self-pay | Admitting: Internal Medicine

## 2014-02-01 VITALS — BP 114/77 | HR 116 | Resp 16 | Ht 63.0 in | Wt 139.0 lb

## 2014-02-01 DIAGNOSIS — I10 Essential (primary) hypertension: Secondary | ICD-10-CM

## 2014-02-01 DIAGNOSIS — E119 Type 2 diabetes mellitus without complications: Secondary | ICD-10-CM

## 2014-02-01 DIAGNOSIS — Z23 Encounter for immunization: Secondary | ICD-10-CM | POA: Diagnosis not present

## 2014-02-01 DIAGNOSIS — R748 Abnormal levels of other serum enzymes: Secondary | ICD-10-CM | POA: Diagnosis not present

## 2014-02-01 LAB — COMPLETE METABOLIC PANEL WITH GFR
ALBUMIN: 3.9 g/dL (ref 3.5–5.2)
ALT: 57 U/L — ABNORMAL HIGH (ref 0–35)
AST: 61 U/L — ABNORMAL HIGH (ref 0–37)
Alkaline Phosphatase: 76 U/L (ref 39–117)
BUN: 20 mg/dL (ref 6–23)
CALCIUM: 9.7 mg/dL (ref 8.4–10.5)
CO2: 29 meq/L (ref 19–32)
Chloride: 102 mEq/L (ref 96–112)
Creat: 0.99 mg/dL (ref 0.50–1.10)
GFR, Est African American: 77 mL/min
GFR, Est Non African American: 67 mL/min
Glucose, Bld: 150 mg/dL — ABNORMAL HIGH (ref 70–99)
Potassium: 3.9 mEq/L (ref 3.5–5.3)
SODIUM: 140 meq/L (ref 135–145)
Total Bilirubin: 0.6 mg/dL (ref 0.2–1.2)
Total Protein: 7.6 g/dL (ref 6.0–8.3)

## 2014-02-01 LAB — HEPATIC FUNCTION PANEL
ALT: 56 U/L — AB (ref 0–35)
AST: 60 U/L — ABNORMAL HIGH (ref 0–37)
Albumin: 3.9 g/dL (ref 3.5–5.2)
Alkaline Phosphatase: 77 U/L (ref 39–117)
BILIRUBIN DIRECT: 0.1 mg/dL (ref 0.0–0.3)
Indirect Bilirubin: 0.6 mg/dL (ref 0.2–1.2)
TOTAL PROTEIN: 7.5 g/dL (ref 6.0–8.3)
Total Bilirubin: 0.7 mg/dL (ref 0.2–1.2)

## 2014-02-01 LAB — HEMOGLOBIN A1C
HEMOGLOBIN A1C: 7.4 % — AB (ref <5.7)
MEAN PLASMA GLUCOSE: 166 mg/dL — AB (ref <117)

## 2014-02-01 MED ORDER — METFORMIN HCL ER (MOD) 500 MG PO TB24: ORAL_TABLET | ORAL | Status: DC

## 2014-02-01 NOTE — Progress Notes (Addendum)
Subjective:    Patient ID: Stacy [name], female    DOB: 1965/11/06, 49 y.o.   MRN: [id number]  HPI  11/2013 note DM: She is only on one 500 mg Metformin daily. Did not get Glumetza. Given RX for glumetza 500 mg one tablet daily for 7 days then two tabs daily. Check fasting and bedtime glucoses and bring next visit  Advised to see nutritionist but pt declines.   Elevated calcium Will recheck today with PTH  Elevated lfts Will recheck today   ETOH dependence: She tells me she has cut down to "one drink a day" She is seeing [employer]. Advised to try AA. I do not think she has stopped her relationship with alcohol   Insomnia Ok for Benadryl one or 2 at night. I counseled pt as long as she is still drinking I will not add a second addictive medication   Tobacco use Advised cessation . Pt not ready This is contributing to her chronic cough   Hyperlipidemia Will get diabetes under control and hesitant to put on statin as long as she is still drinking  See me in December      Stacy [name] is here for follow up  Reports very stressful holiday with loss of husband.    Coping oK  DM  Glucoses at home  160-170 on Glumetza 1000 mg in am.  NO paresthesias  Had eye exam.     Elevated calcium    PTH minimal elevation and most recent calcium normal.    ETOH dependence  She has stopped seeing counselor  Allergies  Allergen Reactions  . Almond Meal Swelling  . Aspirin Nausea Only   Past Medical History  Diagnosis Date  . Hypertension   . Headache(784.0)   . H/O hiatal hernia   . Hyperlipemia   . Tobacco abuse   . Anxiety   . Diabetes mellitus   . GERD (gastroesophageal reflux disease)   . Gout    Past Surgical History  Procedure Laterality Date  . Breast surgery      LT LUMPECTOMY  . Wrist ganglion excision      LT  . Parotidectomy  03/30/2011    Procedure: PAROTIDECTOMY;  Surgeon: Izora Gala, MD;  Location: [place];  Service: ENT;   Laterality: Right;  . Anal fistulectomy  01/19/1985   History   Social History  . Marital Status: Widowed    Spouse Name: N/A    Number of Children: 2  . Years of Education: N/A   Occupational History  .     Social History Main Topics  . Smoking status: Current Every Day Smoker -- 1.00 packs/day for 28 years    Types: Cigarettes  . Smokeless tobacco: Never Used  . Alcohol Use: 7.0 oz/week    14 drink(s) per week  . Drug Use: No  . Sexual Activity: Not Currently    Birth Control/ Protection: Post-menopausal   Other Topics Concern  . Not on file   Social History Narrative   Married. 2 children.    Ages 69, 23 y/o ( entered 09/2012)      Works with [employer]   Family History  Problem Relation Age of Onset  . Coronary artery disease Father 55  . Hypertension Mother   . Diabetes Sister   . Cancer Sister 78    uterine cancer   Patient Active Problem List   Diagnosis Date Noted  . EtOH dependence 11/22/2013  . Bereavement  10/04/2013  . Tobacco use 10/04/2013  . Situational depression 08/23/2013  . Colon polyp 08/23/2013  . Headache(784.0) 08/23/2013  . Marital stress 08/23/2013  . GERD (gastroesophageal reflux disease)   . Anxiety   . Diabetes   . Gout   . Preoperative examination 03/26/2011  . Hypertension   . Hyperlipemia    Current Outpatient Prescriptions on File Prior to Visit  Medication Sig Dispense Refill  . albuterol (PROVENTIL HFA;VENTOLIN HFA) 108 (90 BASE) MCG/ACT inhaler Inhale 2 puffs into lungs every 8 hours prn wheezing 1 Inhaler 0  . ALPRAZolam (XANAX) 1 MG tablet TAKE 1/2 TO 1 TABLET BY MOUTH 3 TIMES A WEEK AT BEDTIME 20 tablet 1  . EPINEPHrine 0.3 mg/0.3 mL IJ SOAJ injection Insert into muscle once with any facial or neck swelling  Instruct in use 1 Device 1  . FLUoxetine (PROZAC) 20 MG capsule Take 1 capsule (20 mg total) by mouth daily. 60 capsule 1  . ibuprofen (ADVIL,MOTRIN) 200 MG tablet Take 800 mg by mouth 2 (two)  times daily.    . metFORMIN (GLUMETZA) 500 MG (MOD) 24 hr tablet Give GLumetza or equivalent. One in am for 7 days then 2 in am with food 60 tablet 3  . Multiple Vitamins-Minerals (ONE-A-DAY WOMENS VITACRAVES) CHEW Chew 1 each by mouth daily.    . pantoprazole (PROTONIX) 40 MG tablet TAKE 1 TABLET EVERY DAY 90 tablet 1  . valsartan-hydrochlorothiazide (DIOVAN-HCT) 320-25 MG per tablet Take one daily 30 tablet 6   No current facility-administered medications on file prior to visit.      Review of Systems See HPI    Objective:   Physical Exam Physical Exam  Nursing note and vitals reviewed.  Constitutional: She is oriented to person, place, and time. She appears well-developed and well-nourished.  HENT:  Head: Normocephalic and atraumatic.  Cardiovascular: Normal rate and regular rhythm. Exam reveals no gallop and no friction rub.  No murmur heard.  Pulmonary/Chest: Breath sounds normal. She has no wheezes. She has no rales.  Neurological: She is alert and oriented to person, place, and time.  Skin: Skin is warm and dry.  Foot exam   Normal microfilament no rashes bil pp strong Psychiatric: She has a normal mood and affect. Her behavior is normal.          Assessment & Plan:  DM  Will increase GLumetza to 500 mg  2 in am and one at dinner    Micral today AIC today  UTD with eye exam  Nl foot exam  See me 3 months  Elevataed lfts  Will check ana and hepatitis panel today .    ETOH dependence  Gave number to [employer] counselor  Elevated calcium will check today   See me next month for CPE   Elevated Calcium will check today   1/20  Spoke with pt and informed of all labs .  She is Hep C AB positive  She is to see me next week will quantify RNA and will need referral to Hepatitis clinic

## 2014-02-02 LAB — MICROALBUMIN / CREATININE URINE RATIO
Creatinine, Urine: 207.4 mg/dL
MICROALB UR: 0.7 mg/dL (ref <2.0)
Microalb Creat Ratio: 3.4 mg/g (ref 0.0–30.0)

## 2014-02-02 LAB — ANTI-NUCLEAR AB-TITER (ANA TITER): ANA Titer 1: NEGATIVE

## 2014-02-02 LAB — ANA: ANA: POSITIVE — AB

## 2014-02-05 LAB — HEPATITIS PANEL, ACUTE
HCV AB: REACTIVE — AB
Hep A IgM: NONREACTIVE
Hep B C IgM: NONREACTIVE
Hepatitis B Surface Ag: NEGATIVE

## 2014-02-05 NOTE — Progress Notes (Signed)
I called and added the hepatitis panel. -eh

## 2014-02-06 LAB — HEPATITIS C RNA QUANTITATIVE

## 2014-02-07 ENCOUNTER — Telehealth: Payer: Self-pay | Admitting: Internal Medicine

## 2014-02-07 NOTE — Telephone Encounter (Signed)
Left message on VM at work for pt to call regarding labs  Attempted to leave message on personal mobile but mailbox full

## 2014-02-11 NOTE — Progress Notes (Signed)
Subjective:    Patient ID: Stacy [name], female    DOB: 04-Jul-1965, 49 y.o.   MRN: [id number]  HPI Valta is here to follow up on newly diagnosed Hepatitis C Ab positive found in work up of elevated transaminases  She has reported ETOH use in past and has been trying to reduce her Etoh consumption.  She tells me she has not had any ETOH in the last 48 hours    Allergies  Allergen Reactions  . Almond Meal Swelling  . Aspirin Nausea Only   Past Medical History  Diagnosis Date  . Hypertension   . Headache(784.0)   . H/O hiatal hernia   . Hyperlipemia   . Tobacco abuse   . Anxiety   . Diabetes mellitus   . GERD (gastroesophageal reflux disease)   . Gout    Past Surgical History  Procedure Laterality Date  . Breast surgery      LT LUMPECTOMY  . Wrist ganglion excision      LT  . Parotidectomy  03/30/2011    Procedure: PAROTIDECTOMY;  Surgeon: Izora Gala, MD;  Location: [place];  Service: ENT;  Laterality: Right;  . Anal fistulectomy  01/19/1985   History   Social History  . Marital Status: Widowed    Spouse Name: N/A    Number of Children: 2  . Years of Education: N/A   Occupational History  .     Social History Main Topics  . Smoking status: Current Every Day Smoker -- 1.00 packs/day for 28 years    Types: Cigarettes  . Smokeless tobacco: Never Used  . Alcohol Use: 7.0 oz/week    14 drink(s) per week  . Drug Use: No  . Sexual Activity: Not Currently    Birth Control/ Protection: Post-menopausal   Other Topics Concern  . Not on file   Social History Narrative   Married. 2 children.    Ages 51, 32 y/o ( entered 09/2012)      Works with [employer]   Family History  Problem Relation Age of Onset  . Coronary artery disease Father 58  . Hypertension Mother   . Diabetes Sister   . Cancer Sister 4    uterine cancer   Patient Active Problem List   Diagnosis Date Noted  . EtOH dependence 11/22/2013  .  Bereavement 10/04/2013  . Tobacco use 10/04/2013  . Situational depression 08/23/2013  . Colon polyp 08/23/2013  . Headache(784.0) 08/23/2013  . Marital stress 08/23/2013  . GERD (gastroesophageal reflux disease)   . Anxiety   . Diabetes   . Gout   . Preoperative examination 03/26/2011  . Hypertension   . Hyperlipemia    Current Outpatient Prescriptions on File Prior to Visit  Medication Sig Dispense Refill  . albuterol (PROVENTIL HFA;VENTOLIN HFA) 108 (90 BASE) MCG/ACT inhaler Inhale 2 puffs into lungs every 8 hours prn wheezing 1 Inhaler 0  . ALPRAZolam (XANAX) 1 MG tablet TAKE 1/2 TO 1 TABLET BY MOUTH 3 TIMES A WEEK AT BEDTIME 20 tablet 1  . EPINEPHrine 0.3 mg/0.3 mL IJ SOAJ injection Insert into muscle once with any facial or neck swelling  Instruct in use 1 Device 1  . FLUoxetine (PROZAC) 20 MG capsule Take 1 capsule (20 mg total) by mouth daily. 60 capsule 1  . ibuprofen (ADVIL,MOTRIN) 200 MG tablet Take 800 mg by mouth 2 (two) times daily.    . metFORMIN (GLUMETZA) 500 MG (MOD) 24 hr tablet Take  two tablets in am and one at dinner 270 tablet 1  . Multiple Vitamins-Minerals (ONE-A-DAY WOMENS VITACRAVES) CHEW Chew 1 each by mouth daily.    . pantoprazole (PROTONIX) 40 MG tablet TAKE 1 TABLET EVERY DAY 90 tablet 1  . valsartan-hydrochlorothiazide (DIOVAN-HCT) 320-25 MG per tablet Take one daily 30 tablet 6   No current facility-administered medications on file prior to visit.        Review of Systems See HPI    Objective:   Physical Exam Physical Exam  Nursing note and vitals reviewed.  Constitutional: She is oriented to person, place, and time. She appears well-developed and well-nourished.  HENT:  Head: Normocephalic and atraumatic.  Cardiovascular: Normal rate and regular rhythm. Exam reveals no gallop and no friction rub.  No murmur heard.  Pulmonary/Chest: Breath sounds normal. She has no wheezes. She has no rales.  ABD:  BS pos  No HSM nontender  nondistended Neurological: She is alert and oriented to person, place, and time.  Skin: Skin is warm and dry.  Psychiatric: She has a normal mood and affect. Her behavior is normal.      Assessment & Plan:  Hep C pos.  Will check quantitative RNA and INR today  Refer to Hepatitis C clinic  ETOH use  Advised again cessation  Elevated transaminases  See above

## 2014-02-12 ENCOUNTER — Ambulatory Visit (INDEPENDENT_AMBULATORY_CARE_PROVIDER_SITE_OTHER): Payer: BLUE CROSS/BLUE SHIELD | Admitting: Internal Medicine

## 2014-02-12 ENCOUNTER — Encounter: Payer: Self-pay | Admitting: Internal Medicine

## 2014-02-12 VITALS — BP 135/91 | HR 90 | Resp 16 | Ht 63.0 in | Wt 143.0 lb

## 2014-02-12 DIAGNOSIS — R748 Abnormal levels of other serum enzymes: Secondary | ICD-10-CM

## 2014-02-12 DIAGNOSIS — F419 Anxiety disorder, unspecified: Secondary | ICD-10-CM | POA: Diagnosis not present

## 2014-02-12 DIAGNOSIS — R894 Abnormal immunological findings in specimens from other organs, systems and tissues: Secondary | ICD-10-CM

## 2014-02-12 DIAGNOSIS — F1029 Alcohol dependence with unspecified alcohol-induced disorder: Secondary | ICD-10-CM | POA: Diagnosis not present

## 2014-02-12 DIAGNOSIS — R7689 Other specified abnormal immunological findings in serum: Secondary | ICD-10-CM

## 2014-02-12 DIAGNOSIS — R768 Other specified abnormal immunological findings in serum: Secondary | ICD-10-CM | POA: Insufficient documentation

## 2014-02-12 LAB — HEPATIC FUNCTION PANEL
ALT: 61 U/L — ABNORMAL HIGH (ref 0–35)
AST: 93 U/L — ABNORMAL HIGH (ref 0–37)
Albumin: 3.9 g/dL (ref 3.5–5.2)
Alkaline Phosphatase: 73 U/L (ref 39–117)
Bilirubin, Direct: 0.1 mg/dL (ref 0.0–0.3)
Indirect Bilirubin: 0.4 mg/dL (ref 0.2–1.2)
Total Bilirubin: 0.5 mg/dL (ref 0.2–1.2)
Total Protein: 7.2 g/dL (ref 6.0–8.3)

## 2014-02-12 MED ORDER — ALPRAZOLAM 1 MG PO TABS: ORAL_TABLET | ORAL | Status: DC

## 2014-02-12 MED ORDER — GLUCOSE BLOOD VI STRP: ORAL_STRIP | Status: DC

## 2014-02-12 NOTE — Patient Instructions (Signed)
Will refer to hepatitis clinic

## 2014-02-13 LAB — HEPATITIS C RNA QUANTITATIVE: HCV Quantitative: NOT DETECTED IU/mL (ref <15)

## 2014-02-13 LAB — PROTIME-INR
INR: 0.95 (ref <1.50)
Prothrombin Time: 12.7 seconds (ref 11.6–15.2)

## 2014-02-26 ENCOUNTER — Ambulatory Visit (HOSPITAL_BASED_OUTPATIENT_CLINIC_OR_DEPARTMENT_OTHER)
Admission: RE | Admit: 2014-02-26 | Discharge: 2014-02-26 | Disposition: A | Discharge status: Home / Self Care | Payer: BLUE CROSS/BLUE SHIELD | Source: Ambulatory Visit | Attending: Internal Medicine | Admitting: Internal Medicine

## 2014-02-26 ENCOUNTER — Encounter (INDEPENDENT_AMBULATORY_CARE_PROVIDER_SITE_OTHER): Payer: BLUE CROSS/BLUE SHIELD | Admitting: Internal Medicine

## 2014-02-26 ENCOUNTER — Encounter: Payer: Self-pay | Admitting: Internal Medicine

## 2014-02-26 VITALS — BP 120/77 | HR 101 | Resp 16 | Ht 63.0 in | Wt 142.0 lb

## 2014-02-26 DIAGNOSIS — E0969 Drug or chemical induced diabetes mellitus with other specified complication: Secondary | ICD-10-CM

## 2014-02-26 DIAGNOSIS — Z01419 Encounter for gynecological examination (general) (routine) without abnormal findings: Secondary | ICD-10-CM

## 2014-02-26 DIAGNOSIS — Z1211 Encounter for screening for malignant neoplasm of colon: Secondary | ICD-10-CM

## 2014-02-26 DIAGNOSIS — E041 Nontoxic single thyroid nodule: Secondary | ICD-10-CM

## 2014-02-26 DIAGNOSIS — Z124 Encounter for screening for malignant neoplasm of cervix: Secondary | ICD-10-CM | POA: Diagnosis not present

## 2014-02-26 DIAGNOSIS — Z1151 Encounter for screening for human papillomavirus (HPV): Secondary | ICD-10-CM | POA: Diagnosis not present

## 2014-02-26 DIAGNOSIS — Z0000 Encounter for general adult medical examination without abnormal findings: Secondary | ICD-10-CM

## 2014-02-26 DIAGNOSIS — R768 Other specified abnormal immunological findings in serum: Secondary | ICD-10-CM

## 2014-02-26 DIAGNOSIS — I10 Essential (primary) hypertension: Secondary | ICD-10-CM

## 2014-02-26 DIAGNOSIS — K635 Polyp of colon: Secondary | ICD-10-CM

## 2014-02-26 DIAGNOSIS — E785 Hyperlipidemia, unspecified: Secondary | ICD-10-CM

## 2014-02-26 DIAGNOSIS — R319 Hematuria, unspecified: Secondary | ICD-10-CM

## 2014-02-26 LAB — POCT URINALYSIS DIPSTICK
Bilirubin, UA: NEGATIVE
Glucose, UA: NEGATIVE
Ketones, UA: NEGATIVE
LEUKOCYTES UA: NEGATIVE
Nitrite, UA: NEGATIVE
PH UA: 6.5
Protein, UA: NEGATIVE
Spec Grav, UA: 1.01
UROBILINOGEN UA: NEGATIVE

## 2014-02-26 LAB — HEMOCCULT GUIAC POC 1CARD (OFFICE): FECAL OCCULT BLD: NEGATIVE

## 2014-02-26 NOTE — Progress Notes (Signed)
Subjective:    Patient ID: Stacy [name], female    DOB: 1965-11-28, 49 y.o.   MRN: [id number]  HPI 02/12/2014 note Hep C pos. Will check quantitative RNA and INR today Refer to Hepatitis C clinic  ETOH use Advised again cessation  Elevated transaminases See above      TODAY  Malkie is here for CPE  HM:  Pap today  .  Pt is a 15 pack-year smoker.     Declines vaccines today wishes to  check with insurance.  UTD with mm , colonoscopy   Hep C AB positive   She has upcoming appt with [facility] hepatitis [facility]  See labs  Quantitative neg  She tells me she has received that HepA/B vaccines years ago  She has a 4 mm left thyroid nodule that she has seen 2 different endocrinologist  Dr. Chalmers Cater and Dr. Posey Pronto at [facility]   ETOH use  Pt states "she is trying ot cut back"    Sister has cervical CA    Allergies  Allergen Reactions  . Almond Meal Swelling  . Aspirin Nausea Only   Past Medical History  Diagnosis Date  . Hypertension   . Headache(784.0)   . H/O hiatal hernia   . Hyperlipemia   . Tobacco abuse   . Anxiety   . Diabetes mellitus   . GERD (gastroesophageal reflux disease)   . Gout   . Hepatitis C    Past Surgical History  Procedure Laterality Date  . Breast surgery      LT LUMPECTOMY  . Wrist ganglion excision      LT  . Parotidectomy  03/30/2011    Procedure: PAROTIDECTOMY;  Surgeon: Izora Gala, MD;  Location: [place];  Service: ENT;  Laterality: Right;  . Anal fistulectomy  01/19/1985   History   Social History  . Marital Status: Widowed    Spouse Name: N/A    Number of Children: 2  . Years of Education: N/A   Occupational History  .     Social History Main Topics  . Smoking status: Current Every Day Smoker -- 1.00 packs/day for 28 years    Types: Cigarettes  . Smokeless tobacco: Never Used  . Alcohol Use: 7.0 oz/week    14 drink(s) per week  . Drug Use: No  . Sexual Activity: Not Currently    Birth Control/ Protection:  Post-menopausal   Other Topics Concern  . Not on file   Social History Narrative   Married. 2 children.    Ages 43, 67 y/o ( entered 09/2012)      Works with [employer]   Family History  Problem Relation Age of Onset  . Coronary artery disease Father 59  . Hypertension Mother   . Diabetes Sister   . Cancer Sister 50    uterine cancer   Patient Active Problem List   Diagnosis Date Noted  . Hepatitis C antibody test positive 02/12/2014  . EtOH dependence 11/22/2013  . Bereavement 10/04/2013  . Tobacco use 10/04/2013  . Situational depression 08/23/2013  . Colon polyp 08/23/2013  . Headache(784.0) 08/23/2013  . Marital stress 08/23/2013  . GERD (gastroesophageal reflux disease)   . Anxiety   . Diabetes   . Gout   . Preoperative examination 03/26/2011  . Hypertension   . Hyperlipemia    Current Outpatient Prescriptions on File Prior to Visit  Medication Sig Dispense Refill  . albuterol (PROVENTIL HFA;VENTOLIN HFA) 108 (90 BASE)  MCG/ACT inhaler Inhale 2 puffs into lungs every 8 hours prn wheezing 1 Inhaler 0  . ALPRAZolam (XANAX) 1 MG tablet TAKE 1/2 TO 1 TABLET BY MOUTH 3 TIMES A WEEK AT BEDTIME 20 tablet 0  . EPINEPHrine 0.3 mg/0.3 mL IJ SOAJ injection Insert into muscle once with any facial or neck swelling  Instruct in use 1 Device 1  . FLUoxetine (PROZAC) 20 MG capsule Take 1 capsule (20 mg total) by mouth daily. 60 capsule 1  . glucose blood test strip Use as instructed 100 each 12  . ibuprofen (ADVIL,MOTRIN) 200 MG tablet Take 800 mg by mouth 2 (two) times daily.    . metFORMIN (GLUMETZA) 500 MG (MOD) 24 hr tablet Take two tablets in am and one at dinner 270 tablet 1  . Multiple Vitamins-Minerals (ONE-A-DAY WOMENS VITACRAVES) CHEW Chew 1 each by mouth daily.    . pantoprazole (PROTONIX) 40 MG tablet TAKE 1 TABLET EVERY DAY 90 tablet 1  . valsartan-hydrochlorothiazide (DIOVAN-HCT) 320-25 MG per tablet Take one daily 30 tablet 6   No current  facility-administered medications on file prior to visit.      Review of Systems  Respiratory: Negative for cough, choking, chest tightness, shortness of breath and wheezing.   Cardiovascular: Negative for chest pain, palpitations and leg swelling.  All other systems reviewed and are negative.      Objective:   Physical Exam Physical Exam  Physical Exam  Nursing note and vitals reviewed.  Constitutional: She is oriented to person, place, and time. She appears well-developed and well-nourished.  HENT:  Head: Normocephalic and atraumatic.  Right Ear: Tympanic membrane and ear canal normal. No drainage. Tympanic membrane is not injected and not erythematous.  Left Ear: Tympanic membrane and ear canal normal. No drainage. Tympanic membrane is not injected and not erythematous.  Nose: Nose normal. Right sinus exhibits no maxillary sinus tenderness and no frontal sinus tenderness. Left sinus exhibits no maxillary sinus tenderness and no frontal sinus tenderness.  Mouth/Throat: Oropharynx is clear and moist. No oral lesions. No oropharyngeal exudate.  Eyes: Conjunctivae and EOM are normal. Pupils are equal, round, and reactive to light.  Neck: Normal range of motion. Neck supple. No JVD present. Carotid bruit is not present. No mass and no thyromegaly present.  Cardiovascular: Normal rate, regular rhythm, S1 normal, S2 normal and intact distal pulses. Exam reveals no gallop and no friction rub.  No murmur heard.  Pulses:  Carotid pulses are 2+ on the right side, and 2+ on the left side.  Dorsalis pedis pulses are 2+ on the right side, and 2+ on the left side.  No carotid bruit. No LE edema  Pulmonary/Chest: Breath sounds normal. She has no wheezes. She has no rales. She exhibits no tenderness.  Breast no discrete mass no nipple discharge no axillary adenopathy bilaterally  Abdominal: Soft. Bowel sounds are normal. She exhibits no distension and no mass. There is no hepatosplenomegaly.  There is no tenderness. There is no CVA tenderness.  Rectal no mass guaiac neg.    Musculoskeletal: Normal range of motion.  No active synovitis to joints.  Lymphadenopathy:  She has no cervical adenopathy.  She has no axillary adenopathy.  Right: No inguinal and no supraclavicular adenopathy present.  Left: No inguinal and no supraclavicular adenopathy present.  Neurological: She is alert and oriented to person, place, and time. She has normal strength and normal reflexes. She displays no tremor. No cranial nerve deficit or sensory deficit. Coordination and gait normal.  Skin: Skin is warm and dry. No rash noted. No cyanosis. Nails show no clubbing.  Psychiatric: She has a normal mood and affect. Her speech is normal and behavior is normal. Cognition and memory are normal.           Assessment & Plan:   HM:  Pap today ,  Declines vaccines financial ,  Colonoscopy done 2014  UTD with mm  15 pack year smoker   HTN  well controlled   Hyperlipidemia  Pending any meds due to liver disease  Hepatitis C AB posititve  Hep clinic appt pending  MNG  Will get repeat u/S here and pt advised to make follow up appt with Dr. Posey Pronto  Phone nunmber provided to pt   ETOH use  She has seen counselors about this and again advised cessation   15 pack year smoker  Educated lung CA screening guidelines  Advised cessation   GERD  Situational depression       Assessment & Plan:

## 2014-02-27 ENCOUNTER — Encounter: Payer: Self-pay | Admitting: Internal Medicine

## 2014-02-27 DIAGNOSIS — E041 Nontoxic single thyroid nodule: Secondary | ICD-10-CM

## 2014-02-27 HISTORY — DX: Nontoxic single thyroid nodule: E04.1

## 2014-02-27 LAB — URINE CULTURE

## 2014-02-27 LAB — URINALYSIS, MICROSCOPIC ONLY
Casts: NONE SEEN
Squamous Epithelial / LPF: NONE SEEN

## 2014-02-28 ENCOUNTER — Telehealth: Payer: Self-pay | Admitting: Internal Medicine

## 2014-02-28 DIAGNOSIS — R945 Abnormal results of liver function studies: Principal | ICD-10-CM

## 2014-02-28 DIAGNOSIS — R7989 Other specified abnormal findings of blood chemistry: Secondary | ICD-10-CM

## 2014-02-28 LAB — CYTOLOGY - PAP

## 2014-02-28 NOTE — Telephone Encounter (Signed)
Spoke with Stacy [name] and given thyroid u/S report.  Slight increase in size and now partially solid area in nodule.  I advised Stacy [name] to be sure to follow up with her endocrinologist Dr. Jeannett Senior.  Will fax results to his office  Stacy [name] states seh will call in am for appt with Dr. Posey Pronto  Will also schedule liver ultrasound  She has appt with hepatitis clinic on 3/4 Stacy [name] is aware

## 2014-02-28 NOTE — Telephone Encounter (Signed)
Left message at work number to call regarding Ultrasound results

## 2014-03-01 ENCOUNTER — Telehealth: Payer: Self-pay | Admitting: *Deleted

## 2014-03-01 NOTE — Telephone Encounter (Signed)
Stacy [name] is aware that she has an appointment for a Abd U/S on 03/07/14 @ 10:30

## 2014-03-07 ENCOUNTER — Ambulatory Visit (HOSPITAL_BASED_OUTPATIENT_CLINIC_OR_DEPARTMENT_OTHER)
Admission: RE | Admit: 2014-03-07 | Discharge: 2014-03-07 | Disposition: A | Discharge status: Home / Self Care | Payer: BLUE CROSS/BLUE SHIELD | Source: Ambulatory Visit | Attending: Internal Medicine | Admitting: Internal Medicine

## 2014-03-07 ENCOUNTER — Telehealth: Payer: Self-pay | Admitting: Internal Medicine

## 2014-03-07 ENCOUNTER — Other Ambulatory Visit: Payer: Self-pay | Admitting: Internal Medicine

## 2014-03-07 DIAGNOSIS — R7989 Other specified abnormal findings of blood chemistry: Secondary | ICD-10-CM | POA: Diagnosis present

## 2014-03-07 DIAGNOSIS — R945 Abnormal results of liver function studies: Secondary | ICD-10-CM

## 2014-03-07 DIAGNOSIS — K769 Liver disease, unspecified: Secondary | ICD-10-CM | POA: Diagnosis not present

## 2014-03-07 DIAGNOSIS — R9389 Abnormal findings on diagnostic imaging of other specified body structures: Secondary | ICD-10-CM

## 2014-03-07 NOTE — Telephone Encounter (Signed)
Spoke with pt and informed of liver ultrasound    Will set up Mri with gadolinium

## 2014-03-07 NOTE — Telephone Encounter (Signed)
I left a detailed message with the info for the MRI date and time-eh

## 2014-03-09 ENCOUNTER — Ambulatory Visit (HOSPITAL_COMMUNITY)
Admission: RE | Admit: 2014-03-09 | Discharge: 2014-03-09 | Disposition: A | Discharge status: Home / Self Care | Payer: BLUE CROSS/BLUE SHIELD | Source: Ambulatory Visit | Attending: Internal Medicine | Admitting: Internal Medicine

## 2014-03-09 DIAGNOSIS — K760 Fatty (change of) liver, not elsewhere classified: Secondary | ICD-10-CM | POA: Insufficient documentation

## 2014-03-09 DIAGNOSIS — D3501 Benign neoplasm of right adrenal gland: Secondary | ICD-10-CM | POA: Diagnosis not present

## 2014-03-09 DIAGNOSIS — K769 Liver disease, unspecified: Secondary | ICD-10-CM | POA: Insufficient documentation

## 2014-03-09 DIAGNOSIS — R7989 Other specified abnormal findings of blood chemistry: Secondary | ICD-10-CM | POA: Diagnosis not present

## 2014-03-09 DIAGNOSIS — R9389 Abnormal findings on diagnostic imaging of other specified body structures: Secondary | ICD-10-CM

## 2014-03-09 MED ORDER — GADOXETATE DISODIUM 0.25 MMOL/ML IV SOLN
6.0000 mL | Freq: Once | INTRAVENOUS | Status: AC | PRN
  Administered 2014-03-09: 10 mL via INTRAVENOUS

## 2014-03-12 ENCOUNTER — Telehealth: Payer: Self-pay | Admitting: Internal Medicine

## 2014-03-12 NOTE — Telephone Encounter (Signed)
Left message on mobile and at work for pt to call regarding MRI results

## 2014-03-13 ENCOUNTER — Telehealth: Payer: Self-pay | Admitting: Internal Medicine

## 2014-03-13 ENCOUNTER — Encounter: Payer: Self-pay | Admitting: *Deleted

## 2014-03-13 DIAGNOSIS — E278 Other specified disorders of adrenal gland: Secondary | ICD-10-CM | POA: Insufficient documentation

## 2014-03-13 NOTE — Telephone Encounter (Signed)
I have faxed over the paperwork for the referral to Dr. Posey Pronto. They will contact Stacy [name] with an appointment

## 2014-03-13 NOTE — Telephone Encounter (Signed)
Spoke with Stacy [name] and informed of MRI results  She tells me she was told from a prior CT scan that she did have a growth on her adrenal gland.  She believes she was seen by Dr. Posey Pronto 2-3 years ago for this  Will refer back to Dr. Posey Pronto and I will fax MRI report to his office

## 2014-04-06 ENCOUNTER — Telehealth: Payer: Self-pay | Admitting: Internal Medicine

## 2014-04-06 NOTE — Telephone Encounter (Signed)
[facility]   Call Dr. Serita Grit office of [facility] endocrinology and see if this pt has an upcoming appt with him.  If she has seen him, I need office note  Also call pt and tell her I want to follow up with her in my office - give her a 30 min appt in 2 weeks

## 2014-04-09 ENCOUNTER — Other Ambulatory Visit: Payer: Self-pay | Admitting: Internal Medicine

## 2014-04-09 NOTE — Telephone Encounter (Signed)
Refill request

## 2014-04-11 ENCOUNTER — Other Ambulatory Visit: Payer: Self-pay | Admitting: *Deleted

## 2014-04-11 MED ORDER — ALPRAZOLAM 1 MG PO TABS: ORAL_TABLET | ORAL | Status: DC

## 2014-04-11 NOTE — Telephone Encounter (Signed)
RX called in .

## 2014-04-11 NOTE — Telephone Encounter (Signed)
I spoke with Stacy [name]. She is going to contacts Dr. Ena Dawley office and set up an appointment. I also made her a follow up Appointment with [place] next week

## 2014-04-11 NOTE — Telephone Encounter (Signed)
Refill request

## 2014-04-16 ENCOUNTER — Encounter: Payer: Self-pay | Admitting: *Deleted

## 2014-04-17 ENCOUNTER — Encounter: Payer: BLUE CROSS/BLUE SHIELD | Admitting: Internal Medicine

## 2014-04-17 NOTE — Progress Notes (Signed)
Subjective:    Patient ID: Stacy [name], female    DOB: [birth year], 49 y.o.   MRN: [id number]  HPI 02/12/2014 note Assessment & Plan:  Hep C pos. Will check quantitative RNA and INR today Refer to Hepatitis C clinic  ETOH use Advised again cessation  Elevated transaminases See above        Stacy [name] is here for follow up.  She has been to hepatology clinic and fortunatedly with a neg quantitative RNA she will not need treatment for Hep C.  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 Almond Meal Swelling  . Aspirin Nausea Only   Past Medical History  Diagnosis Date  . Hypertension   . Headache(784.0)   . H/O hiatal hernia   . Hyperlipemia   . Tobacco abuse   . Anxiety   . Diabetes mellitus   . GERD (gastroesophageal reflux disease)   . Gout   . Hepatitis C   . Thyroid nodule  managed Dr. Posey Pronto 02/27/2014   Past Surgical History  Procedure Laterality Date  . Breast surgery      LT LUMPECTOMY  . Wrist ganglion excision      LT  . Parotidectomy  03/30/2011    Procedure: PAROTIDECTOMY;  Surgeon: Izora Gala, MD;  Location: [place];  Service: ENT;  Laterality: Right;  . Anal fistulectomy  01/19/1985   History   Social History  . Marital Status: Widowed    Spouse Name: N/A  . Number of Children: 2  . Years of Education: N/A   Occupational History  .     Social History Main Topics  . Smoking status: Current Every Day Smoker -- 1.00 packs/day for 28 years    Types: Cigarettes  . Smokeless tobacco: Never Used  . Alcohol Use: 7.0 oz/week    14 drink(s) per week  . Drug Use: No  . Sexual Activity: Not Currently    Birth Control/ Protection: Post-menopausal   Other Topics Concern  . Not on file   Social History Narrative   Married. 2 children.    Ages 51, 67 y/o ( entered 09/2012)      Works with [employer]   Family History  Problem Relation Age of Onset  . Coronary  artery disease Father 28  . Hypertension Mother   . Diabetes Sister   . Cancer Sister 63    uterine cancer   Patient Active Problem List   Diagnosis Date Noted  . Adrenal mass, right Dr Erskine Squibb 03/13/2014  . Thyroid nodule  managed Dr. Posey Pronto 02/27/2014  . Hepatitis C antibody test positive 02/12/2014  . EtOH dependence 11/22/2013  . Bereavement 10/04/2013  . Tobacco use 10/04/2013  . Situational depression 08/23/2013  . Colon polyp 08/23/2013  . Headache(784.0) 08/23/2013  . Marital stress 08/23/2013  . GERD (gastroesophageal reflux disease)   . Anxiety   . Diabetes   . Gout   . Preoperative examination 03/26/2011  . Hypertension   . Hyperlipemia    Current Outpatient Prescriptions on File Prior to Visit  Medication Sig Dispense Refill  . albuterol (PROVENTIL HFA;VENTOLIN HFA) 108 (90 BASE) MCG/ACT inhaler Inhale 2 puffs into lungs every 8 hours prn wheezing 1 Inhaler 0  . ALPRAZolam (XANAX) 1 MG tablet TAKE 1/2 TO 1 TABLET BY MOUTH 3 TIMES A WEEK AT BEDTIME 20 tablet 0  . EPINEPHrine 0.3 mg/0.3 mL IJ SOAJ injection Insert into muscle once  with any facial or neck swelling  Instruct in use 1 Device 1  . FLUoxetine (PROZAC) 20 MG capsule TAKE 1 CAPSULE (20 MG TOTAL) BY MOUTH DAILY. 60 capsule 1  . glucose blood test strip Use as instructed 100 each 12  . ibuprofen (ADVIL,MOTRIN) 200 MG tablet Take 800 mg by mouth 2 (two) times daily.    . metFORMIN (GLUMETZA) 500 MG (MOD) 24 hr tablet Take two tablets in am and one at dinner 270 tablet 1  . Multiple Vitamins-Minerals (ONE-A-DAY WOMENS VITACRAVES) CHEW Chew 1 each by mouth daily.    . pantoprazole (PROTONIX) 40 MG tablet TAKE 1 TABLET EVERY DAY 90 tablet 1  . valsartan-hydrochlorothiazide (DIOVAN-HCT) 320-25 MG per tablet Take one daily 30 tablet 6   No current facility-administered medications on file prior to visit.       Review of Systems See HPI    Objective:   Physical Exam  Physical Exam  Nursing note  and vitals reviewed.  Constitutional: She is oriented to person, place, and time. She appears well-developed and well-nourished.  HENT:  Head: Normocephalic and atraumatic.  Cardiovascular: Normal rate and regular rhythm. Exam reveals no gallop and no friction rub.  No murmur heard.  Pulmonary/Chest: Breath sounds normal. She has no wheezes. She has no rales.  Neurological: She is alert and oriented to person, place, and time.  Skin: Skin is warm and dry.  Psychiatric: She has a normal mood and affect. Her behavior is normal.             Assessment & Plan:

## 2014-05-13 NOTE — Progress Notes (Signed)
Subjective:    Patient ID: Stacy [name], female    DOB: 06-29-1965, 49 y.o.   MRN: [id number]  HPI  Prior note 01/2014   Assessment & Plan:  Hep C pos. Will check quantitative RNA and INR today Refer to Hepatitis C clinic  ETOH use Advised again cessation  Elevated transaminases See above        TODAY   [name] is here for follow up  She has seen Dr.  Claudia Pollock   At Hepatology clinic  See scanned note  Fortunately her Hep C RNA is negative  ETOH dependence  She is trying to cut back  Very tearful about this  .   She is ready to see an addiction specialist   HTN  Well controlled  DM  On Glumetza  Allergies  Allergen Reactions  . Almond Meal Swelling  . Aspirin Nausea Only   Past Medical History  Diagnosis Date  . Hypertension   . Headache(784.0)   . H/O hiatal hernia   . Hyperlipemia   . Tobacco abuse   . Anxiety   . Diabetes mellitus   . GERD (gastroesophageal reflux disease)   . Gout   . Hepatitis C   . Thyroid nodule  managed Dr. Posey Pronto 02/27/2014   Past Surgical History  Procedure Laterality Date  . Breast surgery      LT LUMPECTOMY  . Wrist ganglion excision      LT  . Parotidectomy  03/30/2011    Procedure: PAROTIDECTOMY;  Surgeon: Izora Gala, MD;  Location: [facility];  Service: ENT;  Laterality: Right;  . Anal fistulectomy  01/19/1985   History   Social History  . Marital Status: Widowed    Spouse Name: N/A  . Number of Children: 2  . Years of Education: N/A   Occupational History  .     Social History Main Topics  . Smoking status: Current Every Day Smoker -- 1.00 packs/day for 28 years    Types: Cigarettes  . Smokeless tobacco: Never Used  . Alcohol Use: 7.0 oz/week    14 drink(s) per week  . Drug Use: No  . Sexual Activity: Not Currently    Birth Control/ Protection: Post-menopausal   Other Topics Concern  . Not on file   Social History Narrative   Married. 2 children.    Ages 43, 78 y/o ( entered 09/2012)     Works with [employer]   Family History  Problem Relation Age of Onset  . Coronary artery disease Father 33  . Hypertension Mother   . Diabetes Sister   . Cancer Sister 33    uterine cancer   Patient Active Problem List   Diagnosis Date Noted  . Adrenal mass, right Dr Erskine Squibb 03/13/2014  . Thyroid nodule  managed Dr. Posey Pronto 02/27/2014  . Hepatitis C antibody test positive 02/12/2014  . EtOH dependence 11/22/2013  . Bereavement 10/04/2013  . Tobacco use 10/04/2013  . Situational depression 08/23/2013  . Colon polyp 08/23/2013  . Headache(784.0) 08/23/2013  . Marital stress 08/23/2013  . GERD (gastroesophageal reflux disease)   . Anxiety   . Diabetes   . Gout   . Preoperative examination 03/26/2011  . Hypertension   . Hyperlipemia    Current Outpatient Prescriptions on File Prior to Visit  Medication Sig Dispense Refill  . albuterol (PROVENTIL HFA;VENTOLIN HFA) 108 (90 BASE) MCG/ACT inhaler Inhale 2 puffs into lungs every 8 hours prn wheezing 1 Inhaler 0  .  ALPRAZolam (XANAX) 1 MG tablet TAKE 1/2 TO 1 TABLET BY MOUTH 3 TIMES A WEEK AT BEDTIME 20 tablet 0  . EPINEPHrine 0.3 mg/0.3 mL IJ SOAJ injection Insert into muscle once with any facial or neck swelling  Instruct in use 1 Device 1  . FLUoxetine (PROZAC) 20 MG capsule TAKE 1 CAPSULE (20 MG TOTAL) BY MOUTH DAILY. 60 capsule 1  . glucose blood test strip Use as instructed 100 each 12  . ibuprofen (ADVIL,MOTRIN) 200 MG tablet Take 800 mg by mouth 2 (two) times daily.    . metFORMIN (GLUMETZA) 500 MG (MOD) 24 hr tablet Take two tablets in am and one at dinner 270 tablet 1  . Multiple Vitamins-Minerals (ONE-A-DAY WOMENS VITACRAVES) CHEW Chew 1 each by mouth daily.    . pantoprazole (PROTONIX) 40 MG tablet TAKE 1 TABLET EVERY DAY 90 tablet 1  . valsartan-hydrochlorothiazide (DIOVAN-HCT) 320-25 MG per tablet Take one daily 30 tablet 6   No current facility-administered medications on file prior to  visit.      Review of Systems    see HPI Objective:   Physical Exam Physical Exam  Nursing note and vitals reviewed.  Constitutional: She is oriented to person, place, and time. She appears well-developed and well-nourished.  HENT:  Head: Normocephalic and atraumatic.  Cardiovascular: Normal rate and regular rhythm. Exam reveals no gallop and no friction rub.  No murmur heard.  Pulmonary/Chest: Breath sounds normal. She has no wheezes. She has no rales.  Neurological: She is alert and oriented to person, place, and time.  Skin: Skin is warm and dry.  Psychiatric: She has a normal mood and affect. Her behavior is normal.              Assessment & Plan:  ETOH dependence  Gave number to [employer] addiction counselor and advised to start with her and 12 step program.    Hepatitis C Ab pos Quant RNA neg   Managed by Dr. Claudia Pollock  Hepatology clinic   No active disease   HTN  Continue meds  Elevated lfts  Alcoholic hepatitis   See above   DM  Continue [place] of metformin due to GI SE    Pt has received letter of my departure and advised to make appt this week with new PCP of choice

## 2014-05-14 ENCOUNTER — Ambulatory Visit (INDEPENDENT_AMBULATORY_CARE_PROVIDER_SITE_OTHER): Payer: BLUE CROSS/BLUE SHIELD | Admitting: Internal Medicine

## 2014-05-14 ENCOUNTER — Encounter: Payer: Self-pay | Admitting: Internal Medicine

## 2014-05-14 ENCOUNTER — Other Ambulatory Visit: Payer: Self-pay | Admitting: Internal Medicine

## 2014-05-14 VITALS — BP 126/89 | HR 97 | Resp 16 | Ht 63.0 in | Wt 140.0 lb

## 2014-05-14 DIAGNOSIS — F1029 Alcohol dependence with unspecified alcohol-induced disorder: Secondary | ICD-10-CM | POA: Diagnosis not present

## 2014-05-14 DIAGNOSIS — R945 Abnormal results of liver function studies: Principal | ICD-10-CM

## 2014-05-14 DIAGNOSIS — R7689 Other specified abnormal immunological findings in serum: Secondary | ICD-10-CM

## 2014-05-14 DIAGNOSIS — I10 Essential (primary) hypertension: Secondary | ICD-10-CM

## 2014-05-14 DIAGNOSIS — R7989 Other specified abnormal findings of blood chemistry: Secondary | ICD-10-CM | POA: Diagnosis not present

## 2014-05-14 DIAGNOSIS — R894 Abnormal immunological findings in specimens from other organs, systems and tissues: Secondary | ICD-10-CM

## 2014-05-14 DIAGNOSIS — R768 Other specified abnormal immunological findings in serum: Secondary | ICD-10-CM

## 2014-05-14 DIAGNOSIS — E089 Diabetes mellitus due to underlying condition without complications: Secondary | ICD-10-CM

## 2014-05-14 MED ORDER — FLUOXETINE HCL 20 MG PO CAPS: ORAL_CAPSULE | ORAL | Status: DC

## 2014-05-14 MED ORDER — VALSARTAN-HYDROCHLOROTHIAZIDE 320-25 MG PO TABS: 1.0000 | ORAL_TABLET | Freq: Every day | ORAL | Status: DC

## 2014-05-14 MED ORDER — METFORMIN HCL ER (MOD) 500 MG PO TB24: ORAL_TABLET | ORAL | Status: DC

## 2014-05-14 MED ORDER — PANTOPRAZOLE SODIUM 40 MG PO TBEC: 40.0000 mg | DELAYED_RELEASE_TABLET | Freq: Every day | ORAL | Status: DC

## 2014-05-25 ENCOUNTER — Other Ambulatory Visit: Payer: Self-pay | Admitting: Internal Medicine

## 2014-05-28 NOTE — Telephone Encounter (Signed)
Refill request

## 2014-05-28 NOTE — Telephone Encounter (Signed)
RX called in .

## 2014-10-29 ENCOUNTER — Encounter (HOSPITAL_COMMUNITY): Payer: Self-pay | Admitting: Emergency Medicine

## 2014-10-29 ENCOUNTER — Emergency Department (HOSPITAL_COMMUNITY): Payer: BLUE CROSS/BLUE SHIELD

## 2014-10-29 ENCOUNTER — Emergency Department (HOSPITAL_COMMUNITY)
Admission: EM | Admit: 2014-10-29 | Discharge: 2014-10-29 | Disposition: A | Discharge status: Home / Self Care | Payer: BLUE CROSS/BLUE SHIELD | Attending: Emergency Medicine | Admitting: Emergency Medicine

## 2014-10-29 DIAGNOSIS — M109 Gout, unspecified: Secondary | ICD-10-CM | POA: Insufficient documentation

## 2014-10-29 DIAGNOSIS — R1013 Epigastric pain: Secondary | ICD-10-CM | POA: Diagnosis not present

## 2014-10-29 DIAGNOSIS — K219 Gastro-esophageal reflux disease without esophagitis: Secondary | ICD-10-CM | POA: Insufficient documentation

## 2014-10-29 DIAGNOSIS — E119 Type 2 diabetes mellitus without complications: Secondary | ICD-10-CM | POA: Insufficient documentation

## 2014-10-29 DIAGNOSIS — E785 Hyperlipidemia, unspecified: Secondary | ICD-10-CM | POA: Insufficient documentation

## 2014-10-29 DIAGNOSIS — F419 Anxiety disorder, unspecified: Secondary | ICD-10-CM | POA: Diagnosis not present

## 2014-10-29 DIAGNOSIS — I10 Essential (primary) hypertension: Secondary | ICD-10-CM | POA: Insufficient documentation

## 2014-10-29 DIAGNOSIS — R079 Chest pain, unspecified: Secondary | ICD-10-CM | POA: Diagnosis present

## 2014-10-29 DIAGNOSIS — Z79899 Other long term (current) drug therapy: Secondary | ICD-10-CM | POA: Diagnosis not present

## 2014-10-29 DIAGNOSIS — Z8619 Personal history of other infectious and parasitic diseases: Secondary | ICD-10-CM | POA: Insufficient documentation

## 2014-10-29 DIAGNOSIS — Z720 Tobacco use: Secondary | ICD-10-CM | POA: Insufficient documentation

## 2014-10-29 LAB — HEPATIC FUNCTION PANEL
ALK PHOS: 90 U/L (ref 38–126)
ALT: 124 U/L — AB (ref 14–54)
AST: 123 U/L — ABNORMAL HIGH (ref 15–41)
Albumin: 3.6 g/dL (ref 3.5–5.0)
BILIRUBIN INDIRECT: 0.5 mg/dL (ref 0.3–0.9)
Bilirubin, Direct: 0.1 mg/dL (ref 0.1–0.5)
TOTAL PROTEIN: 7.6 g/dL (ref 6.5–8.1)
Total Bilirubin: 0.6 mg/dL (ref 0.3–1.2)

## 2014-10-29 LAB — CBC
HCT: 39.9 % (ref 36.0–46.0)
Hemoglobin: 13.5 g/dL (ref 12.0–15.0)
MCH: 29.3 pg (ref 26.0–34.0)
MCHC: 33.8 g/dL (ref 30.0–36.0)
MCV: 86.6 fL (ref 78.0–100.0)
Platelets: 316 10*3/uL (ref 150–400)
RBC: 4.61 MIL/uL (ref 3.87–5.11)
RDW: 14.8 % (ref 11.5–15.5)
WBC: 10.2 10*3/uL (ref 4.0–10.5)

## 2014-10-29 LAB — LIPASE, BLOOD: Lipase: 24 U/L (ref 22–51)

## 2014-10-29 LAB — BASIC METABOLIC PANEL
Anion gap: 15 (ref 5–15)
BUN: 10 mg/dL (ref 6–20)
CHLORIDE: 95 mmol/L — AB (ref 101–111)
CO2: 29 mmol/L (ref 22–32)
Calcium: 10.5 mg/dL — ABNORMAL HIGH (ref 8.9–10.3)
Creatinine, Ser: 0.93 mg/dL (ref 0.44–1.00)
GFR calc Af Amer: >60 mL/min (ref >60)
GFR calc non Af Amer: >60 mL/min (ref >60)
Glucose, Bld: 256 mg/dL — ABNORMAL HIGH (ref 65–99)
Potassium: 3.6 mmol/L (ref 3.5–5.1)
Sodium: 139 mmol/L (ref 135–145)

## 2014-10-29 LAB — I-STAT TROPONIN, ED: Troponin i, poc: 0 ng/mL (ref 0.00–0.08)

## 2014-10-29 MED ORDER — SUCRALFATE 1 G PO TABS
1.0000 g | ORAL_TABLET | Freq: Once | ORAL | Status: AC
  Administered 2014-10-29: 1 g via ORAL
  Filled 2014-10-29: qty 1

## 2014-10-29 MED ORDER — GI COCKTAIL ~~LOC~~
30.0000 mL | Freq: Once | ORAL | Status: AC
  Administered 2014-10-29: 30 mL via ORAL
  Filled 2014-10-29: qty 30

## 2014-10-29 MED ORDER — SUCRALFATE 1 G PO TABS: 1.0000 g | ORAL_TABLET | Freq: Three times a day (TID) | ORAL | Status: DC

## 2014-10-29 NOTE — ED Notes (Signed)
Pt sts CP and epigastric pain starting this am; pt denies SOB but sts some nausea and belching

## 2014-10-29 NOTE — ED Provider Notes (Signed)
CSN: [id number]     Arrival date & time 10/29/14  1217 History   First MD Initiated Contact with Patient 10/29/14 1336     Chief Complaint  Patient presents with  . Chest Pain     (Consider location/radiation/quality/duration/timing/severity/associated sxs/prior Treatment) HPI Stacy [name] is a 49 y.o. female with history of hiatal hernia, hypertension, elevated LFTs, diabetes, GERD, presents to emergency department complaining of epigastric abdominal pain. Patient reports she is under a lot of stress. She lost her husband one year ago to MI. She states she is a daily drinker. Patient reports epigastric abdominal pain upon waking up this morning. She denies any shortness of breath, but admits to nausea and belching. She denies any pleuritic symptoms. She denies any diarrhea. Pain radiates around upper abdomen to the back. Denies any dizziness or lightheadedness. Patient does have history of GERD and is currently taking Protonix. Denies any dark or tarry stools. Denies bright red blood in her stool. Denies any urinary symptoms.  Past Medical History  Diagnosis Date  . Hypertension   . Headache(784.0)   . H/O hiatal hernia   . Hyperlipemia   . Tobacco abuse   . Anxiety   . Diabetes mellitus   . GERD (gastroesophageal reflux disease)   . Gout   . Hepatitis C   . Thyroid nodule  managed Dr. Posey Pronto 02/27/2014   Past Surgical History  Procedure Laterality Date  . Breast surgery      LT LUMPECTOMY  . Wrist ganglion excision      LT  . Parotidectomy  03/30/2011    Procedure: PAROTIDECTOMY;  Surgeon: Izora Gala, MD;  Location: [facility];  Service: ENT;  Laterality: Right;  . Anal fistulectomy  01/19/1985   Family History  Problem Relation Age of Onset  . Coronary artery disease Father 70  . Hypertension Mother   . Diabetes Sister   . Cancer Sister 4    uterine cancer   Social History  Substance Use Topics  . Smoking status: Current Every Day Smoker -- 1.00  packs/day for 28 years    Types: Cigarettes  . Smokeless tobacco: Never Used  . Alcohol Use: 7.0 oz/week    14 drink(s) per week   OB History    No data available     Review of Systems  Constitutional: Negative for fever and chills.  Respiratory: Negative for cough, chest tightness and shortness of breath.   Cardiovascular: Negative for chest pain, palpitations and leg swelling.  Gastrointestinal: Positive for nausea and abdominal pain. Negative for vomiting, diarrhea, constipation and blood in stool.  Genitourinary: Negative for dysuria, flank pain and pelvic pain.  Musculoskeletal: Negative for myalgias, arthralgias, neck pain and neck stiffness.  Skin: Negative for rash.  Neurological: Negative for dizziness, weakness and headaches.  All other systems reviewed and are negative.     Allergies  Almond meal and Aspirin  Home Medications   Prior to Admission medications   Medication Sig Start Date End Date Taking? Authorizing Provider  amitriptyline (ELAVIL) 25 MG tablet Take 25 mg by mouth at bedtime. 10/15/14  Yes Historical Provider, MD  EPINEPHrine 0.3 mg/0.3 mL IJ SOAJ injection Insert into muscle once with any facial or neck swelling  Instruct in use 08/23/13  Yes Lanice Shirts, MD  FLUoxetine (PROZAC) 20 MG capsule TAKE 1 CAPSULE (20 MG TOTAL) BY MOUTH DAILY. Patient taking differently: Take 20 mg by mouth daily.  05/14/14  Yes Lanice Shirts, MD  glucose  blood test strip Use as instructed 02/12/14  Yes Lanice Shirts, MD  ibuprofen (ADVIL,MOTRIN) 200 MG tablet Take 800 mg by mouth every 6 (six) hours as needed for moderate pain.    Yes Historical Provider, MD  metFORMIN (GLUMETZA) 500 MG (MOD) 24 hr tablet Take two tablets in am and one at dinner Patient taking differently: Take 500 mg by mouth 2 (two) times daily.  05/14/14  Yes Lanice Shirts, MD  Multiple Vitamins-Minerals (ONE-A-DAY WOMENS VITACRAVES) CHEW Chew 1 each by mouth daily.   Yes  Historical Provider, MD  pantoprazole (PROTONIX) 40 MG tablet Take 1 tablet (40 mg total) by mouth daily. 05/14/14  Yes Lanice Shirts, MD  valsartan-hydrochlorothiazide (DIOVAN-HCT) 320-25 MG per tablet Take 1 tablet by mouth daily. 05/14/14  Yes Lanice Shirts, MD  albuterol (PROVENTIL HFA;VENTOLIN HFA) 108 (90 BASE) MCG/ACT inhaler Inhale 2 puffs into lungs every 8 hours prn wheezing Patient not taking: Reported on 10/29/2014 10/08/13   Lanice Shirts, MD  ALPRAZolam Duanne Moron) 1 MG tablet TAKE 1/2 TO 1 TABLET 3 TIMES A WEEK AT BEDTIME Patient not taking: Reported on 10/29/2014 05/28/14   Lanice Shirts, MD   BP 130/74 mmHg  Pulse 98  Temp(Src) 97.7 F (36.5 C) (Oral)  Resp 21  SpO2 100% Physical Exam  Constitutional: She is oriented to person, place, and time. She appears well-developed and well-nourished. No distress.  HENT:  Head: Normocephalic.  Eyes: Conjunctivae are normal.  Neck: Neck supple.  Cardiovascular: Normal rate, regular rhythm and normal heart sounds.   Pulmonary/Chest: Effort normal and breath sounds normal. No respiratory distress. She has no wheezes. She has no rales.  Abdominal: Soft. Bowel sounds are normal. She exhibits no distension. There is tenderness. There is no rebound.  Epigastric and right upper quadrant tenderness  Musculoskeletal: She exhibits no edema.  Neurological: She is alert and oriented to person, place, and time.  Skin: Skin is warm and dry.  Psychiatric: She has a normal mood and affect. Her behavior is normal.  Nursing note and vitals reviewed.   ED Course  Procedures (including critical care time) Labs Review Labs Reviewed  BASIC METABOLIC PANEL - Abnormal; Notable for the following:    Chloride 95 (*)    Glucose, Bld 256 (*)    Calcium 10.5 (*)    All other components within normal limits  HEPATIC FUNCTION PANEL - Abnormal; Notable for the following:    AST 123 (*)    ALT 124 (*)    All other components within  normal limits  CBC  LIPASE, BLOOD  I-STAT TROPOININ, ED    Imaging Review Dg Chest 2 View  10/29/2014   CLINICAL DATA:  One day history of chest pain with nausea.  EXAM: CHEST  2 VIEW  COMPARISON:  October 03, 2013  FINDINGS: Lungs are clear. The heart size and pulmonary vascularity are normal. No adenopathy. There is a small hiatal hernia. No pneumothorax. No bone lesions.  IMPRESSION: Small hiatal hernia.  No edema or consolidation.   Electronically Signed   By: Lowella Grip III M.D.   On: 10/29/2014 13:18   I have personally reviewed and evaluated these images and lab results as part of my medical decision-making.   EKG Interpretation   Date/Time:  Monday October 29 2014 12:28:22 EDT Ventricular Rate:  111 PR Interval:  130 QRS Duration: 80 QT Interval:  344 QTC Calculation: 467 R Axis:   96 Text Interpretation:  Sinus tachycardia Rightward  axis Borderline ECG no  significant change since 2013 Confirmed by GOLDSTON  MD, SCOTT ([id number]) on  10/29/2014 1:37:55 PM      MDM   Final diagnoses:  None     patient with epigastric abdominal pain, nausea, belching. Although it says patient is having chest pain, she denies it, but states she is concerned she may be having a heart attack. Will get labs, chest x-ray, liver enzymes, lipase, troponin.  4:54 PM Ultrasound was obtained due to elevated LFTs and epigastric and right upper quadrant abdominal pain. Her troponin and chest x-ray, EKG all unremarkable. Doubt ACS. Patient had negative stress test 3 years ago. Her symptoms are atypical. She has multiple risk factors for gastritis and PUD, including taking ibuprofen, daily drinker, currently every day smoker, and under a lot of stress currently.  If ultrasound is negative, patient will be started on Carafate at home. Patient signed out at shift change.    Jeannett Senior, PA-C 10/29/14 [place], MD 10/30/14 [phone]

## 2014-10-29 NOTE — Discharge Instructions (Signed)
Abdominal Pain, Adult Follow-up with your primary care physician. Return for any chest pain or shortness of breath. Many things can cause abdominal pain. Usually, abdominal pain is not caused by a disease and will improve without treatment. It can often be observed and treated at home. Your health care provider will do a physical exam and possibly order blood tests and X-rays to help determine the seriousness of your pain. However, in many cases, more time must pass before a clear cause of the pain can be found. Before that point, your health care provider may not know if you need more testing or further treatment. HOME CARE INSTRUCTIONS Monitor your abdominal pain for any changes. The following actions may help to alleviate any discomfort you are experiencing:  Only take over-the-counter or prescription medicines as directed by your health care provider.  Do not take laxatives unless directed to do so by your health care provider.  Try a clear liquid diet (broth, tea, or water) as directed by your health care provider. Slowly move to a bland diet as tolerated. SEEK MEDICAL CARE IF:  You have unexplained abdominal pain.  You have abdominal pain associated with nausea or diarrhea.  You have pain when you urinate or have a bowel movement.  You experience abdominal pain that wakes you in the night.  You have abdominal pain that is worsened or improved by eating food.  You have abdominal pain that is worsened with eating fatty foods.  You have a fever. SEEK IMMEDIATE MEDICAL CARE IF:  Your pain does not go away within 2 hours.  You keep throwing up (vomiting).  Your pain is felt only in portions of the abdomen, such as the right side or the left lower portion of the abdomen.  You pass bloody or black tarry stools. MAKE SURE YOU:  Understand these instructions.  Will watch your condition.  Will get help right away if you are not doing well or get worse.   This information is not  intended to replace advice given to you by your health care provider. Make sure you discuss any questions you have with your health care provider.   Document Released: 10/15/2004 Document Revised: 09/26/2014 Document Reviewed: 09/14/2012 Elsevier Interactive Patient Education [employer].

## 2014-10-29 NOTE — ED Provider Notes (Signed)
  Physical Exam  BP 130/74 mmHg  Pulse 98  Temp(Src) 97.7 F (36.5 C) (Oral)  Resp 21  SpO2 100%  Physical Exam Abdomen: Epigastric tenderness to palpation. Worse with movement. ED Course  Procedures  Patient was signed out to me by Jeannett Senior, PA-C.  Pain is not presumed to be ACS or cardiac in nature.  It is most likely epigastric pain and is worse with movement. The plan is to order an US of the gallbladder and if normal, dc on carafate.  She is well-appearing and in no acute distress. Her vitals remained stable. Her labs are not concerning, although she has elevated LFTs, this is chronic. I discussed findings with the patient as well as follow-up. I gave her return precautions and she verbally agrees with the plan. Filed Vitals:   10/29/14 1437  BP: 130/74  Pulse: 98  Temp:   Resp: [address], PA-C 10/29/14 1721  Carmin Muskrat, MD 10/29/14 [phone]

## 2014-12-27 LAB — HM DIABETES EYE EXAM

## 2015-01-01 ENCOUNTER — Ambulatory Visit (INDEPENDENT_AMBULATORY_CARE_PROVIDER_SITE_OTHER): Payer: BLUE CROSS/BLUE SHIELD | Admitting: Family Medicine

## 2015-01-01 ENCOUNTER — Encounter: Payer: Self-pay | Admitting: *Deleted

## 2015-01-01 ENCOUNTER — Encounter: Payer: Self-pay | Admitting: Family Medicine

## 2015-01-01 VITALS — BP 122/70 | HR 72 | Temp 97.7°F | Resp 16 | Ht 63.0 in | Wt 148.0 lb

## 2015-01-01 DIAGNOSIS — Z720 Tobacco use: Secondary | ICD-10-CM

## 2015-01-01 DIAGNOSIS — K760 Fatty (change of) liver, not elsewhere classified: Secondary | ICD-10-CM

## 2015-01-01 DIAGNOSIS — E785 Hyperlipidemia, unspecified: Secondary | ICD-10-CM | POA: Diagnosis not present

## 2015-01-01 DIAGNOSIS — E089 Diabetes mellitus due to underlying condition without complications: Secondary | ICD-10-CM | POA: Diagnosis not present

## 2015-01-01 DIAGNOSIS — K7010 Alcoholic hepatitis without ascites: Secondary | ICD-10-CM | POA: Insufficient documentation

## 2015-01-01 DIAGNOSIS — G4700 Insomnia, unspecified: Secondary | ICD-10-CM

## 2015-01-01 DIAGNOSIS — F419 Anxiety disorder, unspecified: Secondary | ICD-10-CM | POA: Diagnosis not present

## 2015-01-01 DIAGNOSIS — Z634 Disappearance and death of family member: Secondary | ICD-10-CM | POA: Diagnosis not present

## 2015-01-01 DIAGNOSIS — I10 Essential (primary) hypertension: Secondary | ICD-10-CM

## 2015-01-01 DIAGNOSIS — F1029 Alcohol dependence with unspecified alcohol-induced disorder: Secondary | ICD-10-CM

## 2015-01-01 DIAGNOSIS — F4321 Adjustment disorder with depressed mood: Secondary | ICD-10-CM

## 2015-01-01 DIAGNOSIS — E041 Nontoxic single thyroid nodule: Secondary | ICD-10-CM | POA: Diagnosis not present

## 2015-01-01 LAB — CBC WITH DIFFERENTIAL/PLATELET
BASOS ABS: 0.1 10*3/uL (ref 0.0–0.1)
Basophils Relative: 1 % (ref 0–1)
EOS PCT: 2 % (ref 0–5)
Eosinophils Absolute: 0.2 10*3/uL (ref 0.0–0.7)
HEMATOCRIT: 44.9 % (ref 36.0–46.0)
HEMOGLOBIN: 14.5 g/dL (ref 12.0–15.0)
LYMPHS ABS: 2.8 10*3/uL (ref 0.7–4.0)
LYMPHS PCT: 29 % (ref 12–46)
MCH: 29.4 pg (ref 26.0–34.0)
MCHC: 32.3 g/dL (ref 30.0–36.0)
MCV: 91.1 fL (ref 78.0–100.0)
MPV: 12.1 fL (ref 8.6–12.4)
Monocytes Absolute: 0.5 10*3/uL (ref 0.1–1.0)
Monocytes Relative: 5 % (ref 3–12)
Neutro Abs: 6 10*3/uL (ref 1.7–7.7)
Neutrophils Relative %: 63 % (ref 43–77)
Platelets: 292 10*3/uL (ref 150–400)
RBC: 4.93 MIL/uL (ref 3.87–5.11)
RDW: 13.1 % (ref 11.5–15.5)
WBC: 9.6 10*3/uL (ref 4.0–10.5)

## 2015-01-01 MED ORDER — FLUOXETINE HCL 20 MG PO CAPS: ORAL_CAPSULE | ORAL | Status: DC

## 2015-01-01 MED ORDER — TRAZODONE HCL 50 MG PO TABS: 25.0000 mg | ORAL_TABLET | Freq: Every evening | ORAL | Status: DC | PRN

## 2015-01-01 MED ORDER — VALSARTAN-HYDROCHLOROTHIAZIDE 320-25 MG PO TABS: 1.0000 | ORAL_TABLET | Freq: Every day | ORAL | Status: DC

## 2015-01-01 MED ORDER — PANTOPRAZOLE SODIUM 40 MG PO TBEC: 40.0000 mg | DELAYED_RELEASE_TABLET | Freq: Every day | ORAL | Status: DC

## 2015-01-01 NOTE — Patient Instructions (Addendum)
Get Flu shot and pneumonia vaccine 23 Start prozac in morning Take trazodone at bedtime for sleep  Continue all other medications for now Referral to therapy [place] of neck to be scheduled  F/U 3 months

## 2015-01-01 NOTE — Assessment & Plan Note (Signed)
Discuss her heavy alcohol dependence which she is using whiskey at night. I think that she needs intensive therapy. Unfortunately her alcohol is also causing hepatitis and GERD has underlying fatty liver disease.

## 2015-01-01 NOTE — Progress Notes (Signed)
Patient ID: Stacy [name], female   DOB: 10/06/1965, 49 y.o.   MRN: [id number]   Subjective:    Patient ID: Stacy [name], female    DOB: 10/03/1965, 49 y.o.   MRN: [id number]  Patient presents for [facility]  patient here to establish care. She has a significant past medical history. She's history of diabetes mellitus diagnosed 3 years ago she's currently on extended release metformin if she does not tolerate the short acting very well. The last A1c I have on file was 7.4%. She states however that her blood sugars have been higher and that she was seen by her ophthalmologist recently who showed that she has diabetic retinopathy. She was also diagnosed with hypertension about 5 years ago.  She is history of hyperlipidemia along with fatty liver she was never started on cholesterol medication however she was noted to have extremely high liver function tests. She had a reactive hepatitis panel at one point she was seen by hepatology from [facility] but she had a negative HCV RNA they thought that this was actually due to her heavy alcohol use. Her husband passed away suddenly last year from a massive heart attack and she found him in the home. Since then she's been medicating with alcohol. She was given fluoxetine which worked well she was also given Xanax and amitriptyline at bedtime but these 2 medications did not help and she ran out of them. Her primary care provider left practice about 8 months ago and she has not had any follow-up since then.  She is history of thyromegaly and a thyroid nodule. She was seen by endocrinology in the past she was sent to an endocrinologist which she had one visit with a few months ago but did not have a follow-up. She is overdue for an ultrasound because of enlarging nodule. She is also a smoker.  Preventative screening was evaluated she is overdue for Pneumovax and flu shot. Her Pap smears up-to-date her mammogram per report is  up-to-date.    Review Of Systems:  GEN- denies fatigue, fever, weight loss,weakness, recent illness HEENT- denies eye drainage, change in vision, nasal discharge, CVS- denies chest pain, palpitations RESP- denies SOB, cough, wheeze ABD- denies N/V, change in stools, abd pain GU- denies dysuria, hematuria, dribbling, incontinence MSK- denies joint pain, muscle aches, injury Neuro- denies headache, dizziness, syncope, seizure activity       Objective:    BP 122/70 mmHg  Pulse 72  Temp(Src) 97.7 F (36.5 C) (Oral)  Resp 16  Ht 5\' 3"  (1.6 m)  Wt 148 lb (67.132 kg)  BMI 26.22 kg/m2 GEN- NAD, alert and oriented x3 HEENT- PERRL, EOMI, non injected sclera, pink conjunctiva, MMM, oropharynx clear Neck- Supple,+ thyromegaly CVS- RRR, no murmur RESP-CTAB ABD-NABS,soft,NT,ND Psych- depressed affect discussing husband, not anxious appearing, no SI EXT- No edema Pulses- Radial, DP- 2+        Assessment & Plan:      Problem List Items Addressed This Visit    None      Note: This dictation was prepared with Dragon dictation along with smaller phrase technology. Any transcriptional errors that result from this process are unintentional.

## 2015-01-01 NOTE — Assessment & Plan Note (Signed)
Obtain ultrasound of thyroid will also check thyroid function test.

## 2015-01-01 NOTE — Assessment & Plan Note (Signed)
Diabetes has not been well controlled. She also has evidence of diabetic retinopathy. She is currently on metformin 500 mg extended release twice a day. Recheck A1c today as well as renal function goals A1c less than 7%. She is also on an ARB but she is not on a statin drug. She will give flu shot and Pneumovax 23 at work

## 2015-01-01 NOTE — Assessment & Plan Note (Signed)
Blood pressure is well controlled medication medication 

## 2015-01-01 NOTE — Assessment & Plan Note (Signed)
Depression along with her bereavement. She also has insomnia and has been self-medicating with alcohol. When he get her set up with another therapist. She will restart her fluoxetine in the morning and take trazodone at night.

## 2015-01-02 ENCOUNTER — Other Ambulatory Visit: Payer: Self-pay | Admitting: *Deleted

## 2015-01-02 ENCOUNTER — Encounter: Payer: Self-pay | Admitting: Family Medicine

## 2015-01-02 ENCOUNTER — Ambulatory Visit (INDEPENDENT_AMBULATORY_CARE_PROVIDER_SITE_OTHER): Payer: BLUE CROSS/BLUE SHIELD | Admitting: Family Medicine

## 2015-01-02 VITALS — BP 132/72 | HR 82 | Temp 98.3°F | Resp 16 | Ht 63.0 in | Wt 148.0 lb

## 2015-01-02 DIAGNOSIS — IMO0001 Reserved for inherently not codable concepts without codable children: Secondary | ICD-10-CM

## 2015-01-02 DIAGNOSIS — E113299 Type 2 diabetes mellitus with mild nonproliferative diabetic retinopathy without macular edema, unspecified eye: Secondary | ICD-10-CM | POA: Diagnosis not present

## 2015-01-02 DIAGNOSIS — E1165 Type 2 diabetes mellitus with hyperglycemia: Secondary | ICD-10-CM | POA: Diagnosis not present

## 2015-01-02 LAB — COMPREHENSIVE METABOLIC PANEL
ALBUMIN: 3.9 g/dL (ref 3.6–5.1)
ALK PHOS: 107 U/L (ref 33–130)
ALT: 68 U/L — AB (ref 6–29)
AST: 51 U/L — ABNORMAL HIGH (ref 10–35)
BUN: 21 mg/dL (ref 7–25)
CALCIUM: 10.5 mg/dL — AB (ref 8.6–10.4)
CO2: 24 mmol/L (ref 20–31)
Chloride: 84 mmol/L — ABNORMAL LOW (ref 98–110)
Creat: 1.18 mg/dL — ABNORMAL HIGH (ref 0.50–1.05)
GLUCOSE: 616 mg/dL — AB (ref 70–99)
POTASSIUM: 4.2 mmol/L (ref 3.5–5.3)
Sodium: 127 mmol/L — ABNORMAL LOW (ref 135–146)
TOTAL PROTEIN: 7.7 g/dL (ref 6.1–8.1)
Total Bilirubin: 0.4 mg/dL (ref 0.2–1.2)

## 2015-01-02 LAB — LIPID PANEL
CHOLESTEROL: 341 mg/dL — AB (ref 125–200)
HDL: 32 mg/dL — ABNORMAL LOW (ref >=46)
Total CHOL/HDL Ratio: 10.7 Ratio — ABNORMAL HIGH (ref <=5.0)
Triglycerides: 1659 mg/dL — ABNORMAL HIGH (ref <150)

## 2015-01-02 LAB — MICROALBUMIN / CREATININE URINE RATIO
CREATININE, URINE: 34 mg/dL (ref 20–320)
MICROALB UR: 0.2 mg/dL
MICROALB/CREAT RATIO: 6 ug/mg{creat} (ref <30)

## 2015-01-02 LAB — T4, FREE: FREE T4: 1.24 ng/dL (ref 0.80–1.80)

## 2015-01-02 LAB — HEMOGLOBIN A1C
HEMOGLOBIN A1C: 12.4 % — AB (ref <5.7)
MEAN PLASMA GLUCOSE: 309 mg/dL — AB (ref <117)

## 2015-01-02 LAB — T3, FREE: T3 FREE: 2.7 pg/mL (ref 2.3–4.2)

## 2015-01-02 LAB — TSH: TSH: 0.492 u[IU]/mL (ref 0.350–4.500)

## 2015-01-02 MED ORDER — GLUCOSE BLOOD VI STRP: ORAL_STRIP | Status: DC

## 2015-01-02 MED ORDER — METFORMIN HCL ER (MOD) 1000 MG PO TB24: 1000.0000 mg | ORAL_TABLET | Freq: Every day | ORAL | Status: DC

## 2015-01-02 MED ORDER — INSULIN DEGLUDEC 100 UNIT/ML ~~LOC~~ SOPN: 40.0000 [IU] | PEN_INJECTOR | Freq: Every day | SUBCUTANEOUS | Status: DC

## 2015-01-02 NOTE — Progress Notes (Signed)
Patient ID: Stacy [name], female   DOB: 1965-08-13, 49 y.o.   MRN: [id number]   Subjective:    Patient ID: Stacy [name], female    DOB: 04-Jul-1965, 49 y.o.   MRN: [id number]  Patient presents for F/U Lab Rresults  here for interim follow-up. She was seen yesterday to establish care her blood glucoses resulted around 2:00 this morning at 616. Her A1c returned at 12.4%. She was not have any symptoms with the exception of change in vision which she had for the past few months and she started seeing her eye doctor for. She had not been taking her metformin on a regular basis. Triglycerides were over 1000 she had mild hyponatremia secondary to the hyperglycemia as well renal function was fairly well-preserved.   Review Of Systems:  GEN- denies fatigue, fever, weight loss,weakness, recent illness HEENT- denies eye drainage, +change in vision, nasal discharge, CVS- denies chest pain, palpitations RESP- denies SOB, cough, wheeze ABD- denies N/V, change in stools, abd pain GU- denies dysuria, hematuria, dribbling, incontinence MSK- denies joint pain, muscle aches, injury Neuro- denies headache, dizziness, syncope, seizure activity       Objective:    BP 132/72 mmHg  Pulse 82  Temp(Src) 98.3 F (36.8 C) (Oral)  Resp 16  Ht 5\' 3"  (1.6 m)  Wt 148 lb (67.132 kg)  BMI 26.22 kg/m2 GEN- NAD, alert and oriented x3  2+        Assessment & Plan:      Problem List Items Addressed This Visit    Diabetic retinopathy ([place]) - Primary   Relevant Medications   metFORMIN (GLUMETZA) 1000 MG (MOD) 24 hr tablet   Insulin Degludec (TRESIBA FLEXTOUCH) 100 UNIT/ML SOPN   Diabetes mellitus type II, uncontrolled (HCC)    Severely uncontrolled diabetes in the setting of noncompliance with medication as well as alcohol abuse. She is going to check her blood sugars 3 times a day. I will restart her on the extended release metformin as this is the only one she tolerates. I'll start her on Tresiba 10  units we've given her her first dose here today in the office. She is instructed on how to use insulin. She will increase her water intake. We will check on her with regards her blood sugar in the next 48 hours to make sure it is indeed coming down. She will follow-up in the office in 2 weeks with her glucometer      Relevant Medications   metFORMIN (GLUMETZA) 1000 MG (MOD) 24 hr tablet   Insulin Degludec (TRESIBA FLEXTOUCH) 100 UNIT/ML SOPN      Note: This dictation was prepared with Dragon dictation along with smaller phrase technology. Any transcriptional errors that result from this process are unintentional.

## 2015-01-02 NOTE — Assessment & Plan Note (Signed)
Severely uncontrolled diabetes in the setting of noncompliance with medication as well as alcohol abuse. She is going to check her blood sugars 3 times a day. I will restart her on the extended release metformin as this is the only one she tolerates. I'll start her on Tresiba 10 units we've given her her first dose here today in the office. She is instructed on how to use insulin. She will increase her water intake. We will check on her with regards her blood sugar in the next 48 hours to make sure it is indeed coming down. She will follow-up in the office in 2 weeks with her glucometer

## 2015-01-02 NOTE — Patient Instructions (Signed)
Inject 10 units at bedtime Check blood sugar three times a day  Plenty of water  Take Metformin once a day with breakfast- extended release F/U 2 weeks- bring meter

## 2015-01-03 ENCOUNTER — Other Ambulatory Visit: Payer: Self-pay | Admitting: *Deleted

## 2015-01-03 ENCOUNTER — Telehealth: Payer: Self-pay | Admitting: *Deleted

## 2015-01-03 MED ORDER — LANCETS MISC: Status: DC

## 2015-01-03 NOTE — Telephone Encounter (Signed)
Pt is scheduled for [place] at [facility] on Dec 21 at 1:15pm, lmtrc to pt for ap pt information

## 2015-01-03 NOTE — Telephone Encounter (Signed)
Received request from pharmacy for PA on [place] (metformin)  PA submitted.   Dx: E11.65- uncontrolled DM

## 2015-01-03 NOTE — Telephone Encounter (Signed)
Received determination.   PA approved.   Pharmacy made aware.

## 2015-01-04 NOTE — Telephone Encounter (Signed)
Pt aware of appt.

## 2015-01-07 ENCOUNTER — Telehealth: Payer: Self-pay | Admitting: Family Medicine

## 2015-01-07 NOTE — Telephone Encounter (Signed)
LMTRC

## 2015-01-07 NOTE — Telephone Encounter (Signed)
Call pt, on Tresiba 10 units  And MEtformin  What are blood sugars running?

## 2015-01-08 NOTE — Telephone Encounter (Signed)
Increase Tresiba to 15 units, continue Metformin

## 2015-01-08 NOTE — Telephone Encounter (Signed)
Pt called back and states that her BS has been running up and down states she didn't start taking it until Dec 17 d/t issues with her monitor and medication  Dec 17 breakfast 373              And when she took it at bedtime it read "H"  Dec 18 Breakfast [place]  Dec 19 breakfast 428 States did not take at lunch or bedtime  Dec 20 breakfast 415 Has been in meeting today and has not taking for lunch yet

## 2015-01-08 NOTE — Telephone Encounter (Signed)
[place] on both work and mobile number

## 2015-01-08 NOTE — Telephone Encounter (Signed)
Pt aware to increase Tresiba to 15u

## 2015-01-09 ENCOUNTER — Ambulatory Visit (HOSPITAL_COMMUNITY)
Admission: RE | Admit: 2015-01-09 | Discharge: 2015-01-09 | Disposition: A | Discharge status: Home / Self Care | Payer: BLUE CROSS/BLUE SHIELD | Source: Ambulatory Visit | Attending: Family Medicine | Admitting: Family Medicine

## 2015-01-09 DIAGNOSIS — E041 Nontoxic single thyroid nodule: Secondary | ICD-10-CM | POA: Insufficient documentation

## 2015-01-09 DIAGNOSIS — E010 Iodine-deficiency related diffuse (endemic) goiter: Secondary | ICD-10-CM | POA: Diagnosis present

## 2015-01-22 ENCOUNTER — Ambulatory Visit: Payer: BLUE CROSS/BLUE SHIELD | Admitting: Family Medicine

## 2015-01-29 ENCOUNTER — Encounter: Payer: Self-pay | Admitting: Family Medicine

## 2015-01-29 ENCOUNTER — Ambulatory Visit (INDEPENDENT_AMBULATORY_CARE_PROVIDER_SITE_OTHER): Payer: BLUE CROSS/BLUE SHIELD | Admitting: Family Medicine

## 2015-01-29 VITALS — BP 128/78 | HR 72 | Temp 97.7°F | Resp 16 | Ht 63.0 in | Wt 144.0 lb

## 2015-01-29 DIAGNOSIS — E1165 Type 2 diabetes mellitus with hyperglycemia: Secondary | ICD-10-CM

## 2015-01-29 DIAGNOSIS — IMO0001 Reserved for inherently not codable concepts without codable children: Secondary | ICD-10-CM

## 2015-01-29 MED ORDER — DAPAGLIFLOZIN PROPANEDIOL 5 MG PO TABS: 5.0000 mg | ORAL_TABLET | Freq: Every day | ORAL | Status: DC

## 2015-01-29 NOTE — Assessment & Plan Note (Signed)
Uncontrolled with diagnosed diabetic retinopathy CBG are slowly coming down I think she had snow ball effect by being off her meds and then with the wine consumption.  Will add Farxiga 5mg  once a day to MTF 1000mg   Continue Tresiba 15 units  F/U via phone in 2 weeks with CBG readings  Hopefully we will be able to tirate off the insulin and stay on oral meds Her weight looks good Recommend waiting another month before getting eye glasses adjusted again

## 2015-01-29 NOTE — Patient Instructions (Signed)
Start [place] once a day  Continue metformin  Continue Tresiba 15units  Call in 2 weeks with blood sugar readings Call for eye appt in Feb  F/U March - 2nd week  for Physical

## 2015-01-29 NOTE — Progress Notes (Signed)
Patient ID: Stacy [name], female   DOB: [birth year], 50 y.o.   MRN: [id number]    Subjective:    Patient ID: Stacy [name], female    DOB: [birth year], 50 y.o.   MRN: [id number]  Patient presents for F/U  Pt here for intermin f/u on diabetes mellitus, uncontrolled A1C  12.4% started on Metformin 1000mg  XR and Tresiba 10 units. CBG fasting were still 300-400 so titrated to 15 units about 10 days ago, since then fasting CBG 193-280, before lunch 200-300. 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 She is working on meal planning, often skips meals or has nabs because of her busy day. NO SE with medications Mild cough past few days, no fever, no congestion     Review Of Systems:  GEN- denies fatigue, fever, weight loss,weakness, recent illness HEENT- denies eye drainage, change in vision, nasal discharge, CVS- denies chest pain, palpitations RESP- denies SOB, +cough, wheeze ABD- denies N/V, change in stools, abd pain GU- denies dysuria, hematuria, dribbling, incontinence MSK- denies joint pain, muscle aches, injury Neuro- denies headache, dizziness, syncope, seizure activity       Objective:    BP 128/78 mmHg  Pulse 72  Temp(Src) 97.7 F (36.5 C) (Oral)  Resp 16  Ht 5\' 3"  (1.6 m)  Wt 144 lb (65.318 kg)  BMI 25.51 kg/m2 GEN- NAD, alert and oriented x3 HEENT- PERRL, EOMI, non injected sclera, pink conjunctiva, MMM, oropharynx clear Neck- Supple, no thyromegaly CVS- RRR, no murmur RESP-CTAB Pulses- Radial 2+        Assessment & Plan:      Problem List Items Addressed This Visit    Diabetes mellitus type II, uncontrolled ([place]) - Primary    Uncontrolled with diagnosed diabetic retinopathy CBG are slowly coming down I think she had snow ball  effect by being off her meds and then with the wine consumption.  Will add Farxiga 5mg  once a day to MTF 1000mg   Continue Tresiba 15 units  F/U via phone in 2 weeks with CBG readings  Hopefully we will be able to tirate off the insulin and stay on oral meds Her weight looks good Recommend waiting another month before getting eye glasses adjusted again  Common Cold- otc COUGH meds      Relevant Medications   dapagliflozin propanediol (FARXIGA) 5 MG TABS tablet      Note: This dictation was prepared with Dragon dictation along with smaller phrase technology. Any transcriptional errors that result from this process are unintentional.

## 2015-01-30 ENCOUNTER — Other Ambulatory Visit: Payer: Self-pay | Admitting: Family Medicine

## 2015-01-30 DIAGNOSIS — Z1231 Encounter for screening mammogram for malignant neoplasm of breast: Secondary | ICD-10-CM

## 2015-02-05 ENCOUNTER — Encounter: Payer: Self-pay | Admitting: *Deleted

## 2015-02-22 ENCOUNTER — Telehealth: Payer: Self-pay | Admitting: Family Medicine

## 2015-02-22 NOTE — Telephone Encounter (Signed)
-----   Message from Barry, LPN sent at [id number]  9:48 AM EST ----- Regarding: RE: F/U CBG readings Call placed to patient.   Reports Fasting CBG readings are as follows: 1/12  213 1/16  231 1/17  230 1/18  195 1/19  192 1/20  165 1/21  278 1/22  156 1/23  188 1/24  258 1/26  185 1/27  176 1/28  204 1/29  182 1/31  210 2/1  189  2/2  236 2/3  185  Patient states that she does test her FSBS every day, but she forgets to write it down at times.   MD to be made aware.  ----- Message -----    From: Alycia Rossetti, MD    Sent: 02/20/2015   4:35 PM      To: Eden Lathe Six, LPN Subject: FW: F/U CBG readings                           Check and see what CBG readings are  ----- Message -----    From: Alycia Rossetti, MD    Sent: 02/18/2015      To: Alycia Rossetti, MD Subject: F/U CBG readings

## 2015-02-22 NOTE — Telephone Encounter (Signed)
CBG are improving but still a little elevated fasting  Increase tresiba to 18 units for now. Continue MTF and the [place]

## 2015-02-22 NOTE — Telephone Encounter (Signed)
Call placed to patient. LMTRC.  

## 2015-03-11 ENCOUNTER — Other Ambulatory Visit: Payer: Self-pay | Admitting: Family Medicine

## 2015-03-11 ENCOUNTER — Encounter: Payer: Self-pay | Admitting: *Deleted

## 2015-03-11 DIAGNOSIS — Z1231 Encounter for screening mammogram for malignant neoplasm of breast: Secondary | ICD-10-CM

## 2015-03-20 ENCOUNTER — Ambulatory Visit
Admission: RE | Admit: 2015-03-20 | Discharge: 2015-03-20 | Disposition: A | Discharge status: Home / Self Care | Payer: BLUE CROSS/BLUE SHIELD | Source: Ambulatory Visit | Attending: Family Medicine | Admitting: Family Medicine

## 2015-03-20 ENCOUNTER — Telehealth: Payer: Self-pay | Admitting: Family Medicine

## 2015-03-20 DIAGNOSIS — Z1231 Encounter for screening mammogram for malignant neoplasm of breast: Secondary | ICD-10-CM

## 2015-03-20 MED ORDER — DAPAGLIFLOZIN PROPANEDIOL 5 MG PO TABS: 5.0000 mg | ORAL_TABLET | Freq: Every day | ORAL | Status: DC

## 2015-03-20 NOTE — Telephone Encounter (Signed)
Pt requests a refill of Farxiga 5 mg [facility] [address]  Pt's phone [phone]

## 2015-03-20 NOTE — Telephone Encounter (Signed)
Prescription sent to pharmacy.

## 2015-03-22 ENCOUNTER — Ambulatory Visit: Payer: BLUE CROSS/BLUE SHIELD

## 2015-04-02 ENCOUNTER — Ambulatory Visit: Payer: BLUE CROSS/BLUE SHIELD | Admitting: Family Medicine

## 2015-04-02 ENCOUNTER — Encounter: Payer: BLUE CROSS/BLUE SHIELD | Admitting: Family Medicine

## 2015-05-01 ENCOUNTER — Encounter: Payer: Self-pay | Admitting: Family Medicine

## 2015-05-01 ENCOUNTER — Ambulatory Visit (INDEPENDENT_AMBULATORY_CARE_PROVIDER_SITE_OTHER): Payer: BLUE CROSS/BLUE SHIELD | Admitting: Family Medicine

## 2015-05-01 VITALS — BP 128/74 | HR 70 | Temp 98.4°F | Resp 12 | Ht 63.0 in | Wt 140.0 lb

## 2015-05-01 DIAGNOSIS — E785 Hyperlipidemia, unspecified: Secondary | ICD-10-CM | POA: Diagnosis not present

## 2015-05-01 DIAGNOSIS — E113299 Type 2 diabetes mellitus with mild nonproliferative diabetic retinopathy without macular edema, unspecified eye: Secondary | ICD-10-CM | POA: Diagnosis not present

## 2015-05-01 DIAGNOSIS — IMO0001 Reserved for inherently not codable concepts without codable children: Secondary | ICD-10-CM

## 2015-05-01 DIAGNOSIS — Z0000 Encounter for general adult medical examination without abnormal findings: Secondary | ICD-10-CM | POA: Diagnosis not present

## 2015-05-01 DIAGNOSIS — E1165 Type 2 diabetes mellitus with hyperglycemia: Secondary | ICD-10-CM | POA: Diagnosis not present

## 2015-05-01 DIAGNOSIS — I10 Essential (primary) hypertension: Secondary | ICD-10-CM

## 2015-05-01 DIAGNOSIS — Z794 Long term (current) use of insulin: Secondary | ICD-10-CM | POA: Diagnosis not present

## 2015-05-01 LAB — LIPID PANEL
CHOL/HDL RATIO: 5.1 ratio — AB (ref <=5.0)
Cholesterol: 216 mg/dL — ABNORMAL HIGH (ref 125–200)
HDL: 42 mg/dL — ABNORMAL LOW (ref >=46)
LDL Cholesterol: 115 mg/dL (ref <130)
Triglycerides: 293 mg/dL — ABNORMAL HIGH (ref <150)
VLDL: 59 mg/dL — AB (ref <30)

## 2015-05-01 LAB — COMPREHENSIVE METABOLIC PANEL
ALBUMIN: 3.9 g/dL (ref 3.6–5.1)
ALT: 42 U/L — ABNORMAL HIGH (ref 6–29)
AST: 33 U/L (ref 10–35)
Alkaline Phosphatase: 77 U/L (ref 33–130)
BILIRUBIN TOTAL: 0.3 mg/dL (ref 0.2–1.2)
BUN: 14 mg/dL (ref 7–25)
CO2: 27 mmol/L (ref 20–31)
CREATININE: 0.84 mg/dL (ref 0.50–1.05)
Calcium: 9.4 mg/dL (ref 8.6–10.4)
Chloride: 104 mmol/L (ref 98–110)
GLUCOSE: 123 mg/dL — AB (ref 70–99)
Potassium: 3.4 mmol/L — ABNORMAL LOW (ref 3.5–5.3)
SODIUM: 141 mmol/L (ref 135–146)
Total Protein: 7.2 g/dL (ref 6.1–8.1)

## 2015-05-01 LAB — CBC WITH DIFFERENTIAL/PLATELET
Basophils Absolute: 65 cells/uL (ref 0–200)
Basophils Relative: 1 %
EOS PCT: 3 %
Eosinophils Absolute: 195 cells/uL (ref 15–500)
HCT: 40.4 % (ref 35.0–45.0)
HEMOGLOBIN: 13.5 g/dL (ref 12.0–15.0)
LYMPHS ABS: 2665 {cells}/uL (ref 850–3900)
Lymphocytes Relative: 41 %
MCH: 29.9 pg (ref 27.0–33.0)
MCHC: 33.4 g/dL (ref 32.0–36.0)
MCV: 89.4 fL (ref 80.0–100.0)
MPV: 10.1 fL (ref 7.5–12.5)
Monocytes Absolute: 325 cells/uL (ref 200–950)
Monocytes Relative: 5 %
NEUTROS PCT: 50 %
Neutro Abs: 3250 cells/uL (ref 1500–7800)
Platelets: 235 10*3/uL (ref 140–400)
RBC: 4.52 MIL/uL (ref 3.80–5.10)
RDW: 15.3 % — AB (ref 11.0–15.0)
WBC: 6.5 10*3/uL (ref 3.8–10.8)

## 2015-05-01 NOTE — Assessment & Plan Note (Signed)
Blood pressure is controlled. We have a long discussion on proper diet nutrition. 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 Discussed that she can use protein shakes for supplement in the morning she is on the go. She needs also pack healthy meals take with her to work on meal planning on the weekends to help her.

## 2015-05-01 NOTE — Assessment & Plan Note (Signed)
Recheck A1c. She is currently on ARB She is not on statin drug once again her blood sugars down first.

## 2015-05-01 NOTE — Patient Instructions (Addendum)
[facility]  We will call with lab results Pneumovax 23 at [facility] department  Continue current meds for now F/U 3 months

## 2015-05-01 NOTE — Progress Notes (Signed)
Patient ID: Stacy [name], female   DOB: 03/29/1965, 50 y.o.   MRN: [id number]    Subjective:    Patient ID: Stacy [name], female    DOB: June 21, 1965, 50 y.o.   MRN: [id number]  Patient presents for CPE Patient here for complete physical exam. Mammogram Pap smear colonoscopy are up-to-date. due for pneumonia vaccine. TDAP UTD Since her last visit she returns her ophthalmologist for diabetic retinopathy he had improved significantly. Her vision is much better. She is still having difficulty with meal planning and adjusting to having the diabetes. Her last A1c was at 12.4% when she was started on her insulin therapy. She is currently on Tarceva 15 units at bedtime metformin and Farxiga  Sugars continue to fluctuate 180-93 she often skips meals and then will eat late at night 60 she will drink milk or eat popcorn and she is still eating on the go.    Review Of Systems:  GEN- denies fatigue, fever, weight loss,weakness, recent illness HEENT- denies eye drainage, change in vision, nasal discharge, CVS- denies chest pain, palpitations RESP- denies SOB, cough, wheeze ABD- denies N/V, change in stools, abd pain GU- denies dysuria, hematuria, dribbling, incontinence MSK- denies joint pain, muscle aches, injury Neuro- denies headache, dizziness, syncope, seizure activity       Objective:    BP 128/74 mmHg  Pulse 70  Temp(Src) 98.4 F (36.9 C) (Oral)  Resp 12  Ht 5\' 3"  (1.6 m)  Wt 140 lb (63.504 kg)  BMI 24.81 kg/m2 GEN- NAD, alert and oriented x3 HEENT- PERRL, EOMI, non injected sclera, pink conjunctiva, MMM, oropharynx clear Neck- Supple, no thyromegaly CVS- RRR, no murmur RESP-CTAB ABD-NABS,soft,NT,ND EXT- No edema Pulses- Radial, DP- 2+        Assessment & Plan:      Problem List Items Addressed This Visit    Hypertension    Blood pressure is controlled. We have a long discussion on proper diet nutrition. 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 Discussed that she can use protein shakes for supplement in the morning she is on the go. She needs also pack healthy meals take with her to work on meal planning on the weekends to help her.      Hyperlipemia   Relevant Orders   Lipid panel   Diabetic retinopathy ([place])   Diabetes mellitus type II, uncontrolled ([place]) - Primary    Recheck A1c. She is currently on ARB She is not on statin drug once again her blood sugars down first.      Relevant Orders   CBC with Differential/Platelet   Comprehensive metabolic panel   Hemoglobin A1c   Lipid panel    Other Visit Diagnoses    Routine general medical examination at a health care facility        CPE done, get Pneumovax23 at work       Note: This dictation was prepared with Dragon dictation along with smaller phrase technology. Any transcriptional errors that result from this process are unintentional.

## 2015-05-02 LAB — HEMOGLOBIN A1C
Hgb A1c MFr Bld: 8.4 % — ABNORMAL HIGH (ref <5.7)
Mean Plasma Glucose: 194 mg/dL

## 2015-05-06 ENCOUNTER — Encounter: Payer: Self-pay | Admitting: *Deleted

## 2015-05-06 ENCOUNTER — Other Ambulatory Visit: Payer: Self-pay | Admitting: *Deleted

## 2015-05-06 MED ORDER — INSULIN DEGLUDEC 100 UNIT/ML ~~LOC~~ SOPN: 20.0000 [IU] | PEN_INJECTOR | Freq: Every day | SUBCUTANEOUS | Status: DC

## 2015-05-06 MED ORDER — ATORVASTATIN CALCIUM 20 MG PO TABS: 20.0000 mg | ORAL_TABLET | Freq: Every day | ORAL | Status: DC

## 2015-05-24 ENCOUNTER — Other Ambulatory Visit: Payer: Self-pay | Admitting: Family Medicine

## 2015-05-24 NOTE — Telephone Encounter (Signed)
Refill appropriate and filled per protocol. 

## 2015-06-29 ENCOUNTER — Other Ambulatory Visit: Payer: Self-pay | Admitting: Family Medicine

## 2015-07-01 NOTE — Telephone Encounter (Signed)
Refill appropriate and filled per protocol. 

## 2015-07-31 ENCOUNTER — Ambulatory Visit: Payer: BLUE CROSS/BLUE SHIELD | Admitting: Family Medicine

## 2015-08-05 ENCOUNTER — Ambulatory Visit (INDEPENDENT_AMBULATORY_CARE_PROVIDER_SITE_OTHER): Payer: BLUE CROSS/BLUE SHIELD | Admitting: Family Medicine

## 2015-08-05 ENCOUNTER — Encounter: Payer: Self-pay | Admitting: Family Medicine

## 2015-08-05 VITALS — BP 118/68 | HR 82 | Temp 98.7°F | Resp 14 | Ht 63.0 in | Wt 139.0 lb

## 2015-08-05 DIAGNOSIS — Z794 Long term (current) use of insulin: Secondary | ICD-10-CM | POA: Diagnosis not present

## 2015-08-05 DIAGNOSIS — IMO0001 Reserved for inherently not codable concepts without codable children: Secondary | ICD-10-CM

## 2015-08-05 DIAGNOSIS — F418 Other specified anxiety disorders: Secondary | ICD-10-CM

## 2015-08-05 DIAGNOSIS — E785 Hyperlipidemia, unspecified: Secondary | ICD-10-CM | POA: Diagnosis not present

## 2015-08-05 DIAGNOSIS — E1165 Type 2 diabetes mellitus with hyperglycemia: Secondary | ICD-10-CM | POA: Diagnosis not present

## 2015-08-05 DIAGNOSIS — I10 Essential (primary) hypertension: Secondary | ICD-10-CM

## 2015-08-05 MED ORDER — SUMATRIPTAN SUCCINATE 100 MG PO TABS: 100.0000 mg | ORAL_TABLET | ORAL | Status: DC | PRN

## 2015-08-05 NOTE — Assessment & Plan Note (Signed)
Continue prozac Given number for counseling pt to call

## 2015-08-05 NOTE — Patient Instructions (Addendum)
F/U 3 months Get the cholesterol done fasitng at work  [employer]- [phone]

## 2015-08-05 NOTE — Assessment & Plan Note (Signed)
Well controlled, no change to meds, non fasting labs today Cholesterol to be done fasting at her job

## 2015-08-05 NOTE — Assessment & Plan Note (Signed)
Goal A1C less than 7% Check labs today, adjust Tyler Aas as needed  On statin drug, ACEI

## 2015-08-05 NOTE — Progress Notes (Signed)
Patient ID: Stacy [name], female   DOB: [birth year], 50 y.o.   MRN: [id number]   Subjective:    Patient ID: Stacy [name], female    DOB: September 15, 1965, 50 y.o.   MRN: [id number]  Patient presents for 3 month F/U Patient here for follow-up on diabetes mellitus. Her last A1c was 8.4% she is currently on for Farxiga, metformin and Tresiba 20  Units She was started on Lipitor for elevated cholesterol triglycerides initially were 1600 at diagnosis her last visit they were down to 93 total cholesterol but also improved over 100 points LDL is 1:15 Her medications were reviewed She states her blood sugars have been okay but the past 2 weeks they have been in the 200s prior to that there about 160s fasting. 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 Period she denies any hypoglycemia symptoms. She continues to have some stress at work and goes 2 times where she feels really down with regards to the passing of her husband. She would like to proceed with some therapy as we have previously discussed. She wants to try restoration place    Review Of Systems:  GEN- denies fatigue, fever, weight loss,weakness, recent illness HEENT- denies eye drainage, change in vision, nasal discharge, CVS- denies chest pain, palpitations RESP- denies SOB, cough, wheeze ABD- denies N/V, change in stools, abd pain GU- denies dysuria, hematuria, dribbling, incontinence MSK- denies joint pain, muscle aches, injury Neuro- denies headache, dizziness, syncope, seizure activity       Objective:    BP 118/68 mmHg  Pulse 82  Temp(Src) 98.7 F (37.1 C) (Oral)  Resp 14  Ht 5\' 3"  (1.6 m)  Wt 139 lb (63.05 kg)  BMI 24.63 kg/m2 GEN- NAD, alert and oriented x3 HEENT- PERRL, EOMI, non injected sclera, pink conjunctiva, MMM, oropharynx clear Neck- Supple, no  thyromegaly CVS- RRR, no murmur RESP-CTAB Psych- normal affect and mood EXT- No edema Pulses- Radial, DP- 2+        Assessment & Plan:      Problem List Items Addressed This Visit    Hypertension    Well controlled, no change to meds, non fasting labs today Cholesterol to be done fasting at her job      Relevant Orders   CBC with Differential/Platelet   Comprehensive metabolic panel   Hyperlipemia   Relevant Orders   Lipid panel   Diabetes mellitus type II, uncontrolled ([place]) - Primary    Goal A1C less than 7% Check labs today, adjust Tyler Aas as needed  On statin drug, ACEI      Relevant Orders   CBC with Differential/Platelet   Comprehensive metabolic panel   Hemoglobin A1c   [facility] DIABETES FOOT EXAM (Completed)   Depression with anxiety    Continue prozac Given number for counseling pt to call          Note: This dictation was prepared with Dragon dictation along with smaller phrase technology. Any transcriptional errors that result from this process are unintentional.

## 2015-08-06 LAB — CBC WITH DIFFERENTIAL/PLATELET
Basophils Absolute: 0 cells/uL (ref 0–200)
Basophils Relative: 0 %
EOS PCT: 2 %
Eosinophils Absolute: 186 cells/uL (ref 15–500)
HCT: 40.9 % (ref 35.0–45.0)
HEMOGLOBIN: 13.9 g/dL (ref 12.0–15.0)
LYMPHS ABS: 2325 {cells}/uL (ref 850–3900)
Lymphocytes Relative: 25 %
MCH: 30.4 pg (ref 27.0–33.0)
MCHC: 34 g/dL (ref 32.0–36.0)
MCV: 89.5 fL (ref 80.0–100.0)
MONOS PCT: 6 %
MPV: 10 fL (ref 7.5–12.5)
Monocytes Absolute: 558 cells/uL (ref 200–950)
NEUTROS ABS: 6231 {cells}/uL (ref 1500–7800)
NEUTROS PCT: 67 %
PLATELETS: 356 10*3/uL (ref 140–400)
RBC: 4.57 MIL/uL (ref 3.80–5.10)
RDW: 14.4 % (ref 11.0–15.0)
WBC: 9.3 10*3/uL (ref 3.8–10.8)

## 2015-08-06 LAB — COMPREHENSIVE METABOLIC PANEL
ALBUMIN: 3.9 g/dL (ref 3.6–5.1)
ALT: 29 U/L (ref 6–29)
AST: 34 U/L (ref 10–35)
Alkaline Phosphatase: 77 U/L (ref 33–130)
BILIRUBIN TOTAL: 0.4 mg/dL (ref 0.2–1.2)
BUN: 27 mg/dL — ABNORMAL HIGH (ref 7–25)
CO2: 28 mmol/L (ref 20–31)
Calcium: 10.4 mg/dL (ref 8.6–10.4)
Chloride: 96 mmol/L — ABNORMAL LOW (ref 98–110)
Creat: 1.28 mg/dL — ABNORMAL HIGH (ref 0.50–1.05)
GLUCOSE: 177 mg/dL — AB (ref 70–99)
Potassium: 3.8 mmol/L (ref 3.5–5.3)
SODIUM: 137 mmol/L (ref 135–146)
Total Protein: 7.5 g/dL (ref 6.1–8.1)

## 2015-08-06 LAB — HEMOGLOBIN A1C
Hgb A1c MFr Bld: 7.6 % — ABNORMAL HIGH (ref <5.7)
MEAN PLASMA GLUCOSE: 171 mg/dL

## 2015-08-08 ENCOUNTER — Other Ambulatory Visit: Payer: Self-pay | Admitting: Family Medicine

## 2015-08-08 ENCOUNTER — Encounter: Payer: Self-pay | Admitting: Family Medicine

## 2015-09-30 ENCOUNTER — Telehealth: Payer: Self-pay | Admitting: *Deleted

## 2015-09-30 NOTE — Telephone Encounter (Signed)
Received fax from patient with recent lab results.   MD reviewed and recommendations are as follows: Renal function improved.  LFT slightly elevated D/T fatty liver disease Lipid panel not noted Keep appointment in Oct.   Call placed to patient and patient made aware.   States that she will check with lab to verify lipid panel was drawn and send results.

## 2015-10-09 ENCOUNTER — Other Ambulatory Visit: Payer: Self-pay | Admitting: Family Medicine

## 2015-10-17 ENCOUNTER — Telehealth: Payer: Self-pay | Admitting: *Deleted

## 2015-10-17 MED ORDER — GLUMETZA 1000 MG PO TB24: ORAL_TABLET | ORAL | 3 refills | Status: DC

## 2015-10-17 MED ORDER — TRAZODONE HCL 50 MG PO TABS: ORAL_TABLET | ORAL | 3 refills | Status: DC

## 2015-10-17 NOTE — Telephone Encounter (Signed)
Received fax requesting refill on trazodone and Glumetza.   Refill appropriate and filled per protocol.

## 2015-11-06 ENCOUNTER — Encounter: Payer: Self-pay | Admitting: Family Medicine

## 2015-11-06 ENCOUNTER — Ambulatory Visit (INDEPENDENT_AMBULATORY_CARE_PROVIDER_SITE_OTHER): Payer: BLUE CROSS/BLUE SHIELD | Admitting: Family Medicine

## 2015-11-06 VITALS — BP 128/64 | HR 72 | Temp 98.4°F | Resp 14 | Ht 63.0 in | Wt 142.0 lb

## 2015-11-06 DIAGNOSIS — R946 Abnormal results of thyroid function studies: Secondary | ICD-10-CM

## 2015-11-06 DIAGNOSIS — K760 Fatty (change of) liver, not elsewhere classified: Secondary | ICD-10-CM | POA: Diagnosis not present

## 2015-11-06 DIAGNOSIS — E119 Type 2 diabetes mellitus without complications: Secondary | ICD-10-CM

## 2015-11-06 DIAGNOSIS — Z794 Long term (current) use of insulin: Secondary | ICD-10-CM | POA: Diagnosis not present

## 2015-11-06 DIAGNOSIS — I10 Essential (primary) hypertension: Secondary | ICD-10-CM | POA: Diagnosis not present

## 2015-11-06 DIAGNOSIS — E7800 Pure hypercholesterolemia, unspecified: Secondary | ICD-10-CM

## 2015-11-06 DIAGNOSIS — J069 Acute upper respiratory infection, unspecified: Secondary | ICD-10-CM | POA: Diagnosis not present

## 2015-11-06 DIAGNOSIS — E113293 Type 2 diabetes mellitus with mild nonproliferative diabetic retinopathy without macular edema, bilateral: Secondary | ICD-10-CM | POA: Diagnosis not present

## 2015-11-06 DIAGNOSIS — R7989 Other specified abnormal findings of blood chemistry: Secondary | ICD-10-CM

## 2015-11-06 DIAGNOSIS — R221 Localized swelling, mass and lump, neck: Secondary | ICD-10-CM | POA: Diagnosis not present

## 2015-11-06 DIAGNOSIS — E041 Nontoxic single thyroid nodule: Secondary | ICD-10-CM

## 2015-11-06 NOTE — Assessment & Plan Note (Signed)
Diabetes is significantly improved from an acute per the same dose of medication.

## 2015-11-06 NOTE — Assessment & Plan Note (Signed)
Known thyroid nodule mildly abnormal TSH we'll repeat this along with T3 and T4 I will also get a CT scan of her neck especially with this mass adjacent I do not think it is connected to the thyroid. Differentials include  enlarged lymph node versus fatting tumor or other type of subcutaneous cyst

## 2015-11-06 NOTE — Progress Notes (Signed)
Subjective:    Patient ID: Stacy [name], female    DOB: 07/30/1965, 50 y.o.   MRN: [id number]  Patient presents for 3 month F/u (is not fasting)  Pt here for f/u on DM- last A1C  7.6% improved from 8.4%. She in on Tresiba 22units , farxiga and [place] CBG Range States they've been good she did not bring her meter with her today. She did have repeat labs done on Monday at her job for A1c returned at 7% she had mild elevation in LFT in Sept, she had hepatic steatosis on [place] in Oct 2016 she also continues to drink alcohol that she has improved on the quantity. Her repeat liver function test were still elevated with an AST of 53 ALT of 51 her renal function was preserved. Her cholesterol panel was also drawn on Monday her total cholesterol 245 triglycerides 255 LDL 144.  She has history of goiter with thyroid nodule on the left side this was being followed by endocrinology. She also has a small mass that is then on her right neck for many years but seems so grown in the past couple months it was as large as the size of an orange it only causes pain when she turned her neck. I reviewed her previous imaging there was no mention of this the ultrasound of her neck only showed the thyroid with benign nodules and cysts as well as some lymphadenopathy which looked reactive. Her TSH was mildly low at 0.429 her other labs over the past couple of years have all been around 0.45  She also had mildly low vitamin D at 26.9  She has had some sinus drainage mild sore throat cough with mild production. This is been the past couple days no fever or [place] blood cell count was mildly elevated at 11.3 she is requesting a chest x-ray as she is a smoker. She was prescribed Allegra by her nurse practitioner at her job.  He was also worried about a benign adrenal adenoma she did have an MRI of the liver and ultrasound last year which show stability and benign nature this was an incidental finding   Review Of Systems:  GEN- denies fatigue, fever, weight loss,weakness, recent illness HEENT- denies eye drainage, change in vision,+ nasal discharge, CVS- denies chest pain, palpitations RESP- denies SOB, +cough, wheeze ABD- denies N/V, change in stools, abd pain GU- denies dysuria, hematuria, dribbling, incontinence MSK- denies joint pain, muscle aches, injury Neuro- denies headache, dizziness, syncope, seizure activity       Objective:    BP 128/64 (BP Location: Left Arm, Patient Position: Sitting, Cuff Size: Normal)   Pulse 72   Temp 98.4 F (36.9 C) (Oral)   Resp 14   Ht 5\' 3"  (1.6 m)   Wt 142 lb (64.4 kg)   BMI 25.15 kg/m  GEN- NAD, alert and oriented x3 HEENT- PERRL, EOMI, non injected sclera, pink conjunctiva, MMM, oropharynx clear, TM clear bilat, no effusion, no sinus tenderness, nares clear rhinorrhea  Neck- Supple, mild thyromegaly, golf ball size swelling right side of neck/supraclavivular region, NT  CVS- RRR, no murmur RESP-CTAB EXT- No edema Pulses- Radial, DP- 2+        Assessment & Plan:      Problem List Items Addressed This Visit    Thyroid nodule  managed Dr. Posey Pronto    Known thyroid nodule mildly abnormal TSH we'll repeat this along with T3 and T4 I will also get a CT scan of her neck  especially with this mass adjacent I do not think it is connected to the thyroid. Differentials include  enlarged lymph node versus fatting tumor or other type of subcutaneous cyst      Relevant Orders   TSH   T3, free   T4, free   Hypertension    Well controlled, no changes      Relevant Orders   CBC with Differential/Platelet   Hyperlipemia   Fatty liver - Primary    Hyperlipidemia also with alcohol use and known fatty liver. Discussed complete alcohol cessation. She is already statin drug will add Fish oil back twice a day We'll check an AFP      Relevant Orders   AFP tumor marker   Diabetic retinopathy ([place])   Diabetes mellitus, type II, insulin dependent ([place])     Diabetes is significantly improved from an acute per the same dose of medication.       Other Visit Diagnoses    Mass of right side of neck       Relevant Orders   CT SOFT TISSUE NECK W CONTRAST   Acute URI       Supportive care over the counter cough syrup Coricidin, CXR as she is a smoker, but likely will be clear, I think this is viral   Relevant Orders   DG Chest 2 View   Abnormal TSH          Note: This dictation was prepared with Dragon dictation along with smaller phrase technology. Any transcriptional errors that result from this process are unintentional.

## 2015-11-06 NOTE — Assessment & Plan Note (Signed)
Well controlled, no changes 

## 2015-11-06 NOTE — Assessment & Plan Note (Addendum)
Hyperlipidemia also with alcohol use and known fatty liver. Discussed complete alcohol cessation. She is already statin drug will add Fish oil back twice a day We'll check an AFP

## 2015-11-06 NOTE — Patient Instructions (Signed)
Take [place] for cough Get the chest xray CT of neck to be scheduled We will call with labs Continue your insulin and diabetes medications Get pneumonia vaccine 23 at work and flu shot  F/U 3 months

## 2015-11-07 ENCOUNTER — Encounter: Payer: Self-pay | Admitting: Family Medicine

## 2015-11-07 LAB — CBC WITH DIFFERENTIAL/PLATELET
BASOS PCT: 0 %
Basophils Absolute: 0 cells/uL (ref 0–200)
EOS ABS: 184 {cells}/uL (ref 15–500)
Eosinophils Relative: 2 %
HEMATOCRIT: 40.7 % (ref 35.0–45.0)
HEMOGLOBIN: 13.9 g/dL (ref 12.0–15.0)
LYMPHS ABS: 2576 {cells}/uL (ref 850–3900)
Lymphocytes Relative: 28 %
MCH: 31.1 pg (ref 27.0–33.0)
MCHC: 34.2 g/dL (ref 32.0–36.0)
MCV: 91.1 fL (ref 80.0–100.0)
MONO ABS: 368 {cells}/uL (ref 200–950)
MONOS PCT: 4 %
MPV: 10.3 fL (ref 7.5–12.5)
NEUTROS ABS: 6072 {cells}/uL (ref 1500–7800)
Neutrophils Relative %: 66 %
PLATELETS: 316 10*3/uL (ref 140–400)
RBC: 4.47 MIL/uL (ref 3.80–5.10)
RDW: 14.8 % (ref 11.0–15.0)
WBC: 9.2 10*3/uL (ref 3.8–10.8)

## 2015-11-07 LAB — TSH: TSH: 0.57 mIU/L

## 2015-11-07 LAB — AFP TUMOR MARKER: AFP TUMOR MARKER: 6.7 ng/mL — AB (ref <6.1)

## 2015-11-07 LAB — T4, FREE: FREE T4: 0.9 ng/dL (ref 0.8–1.8)

## 2015-11-07 LAB — T3, FREE: T3, Free: 2.3 pg/mL (ref 2.3–4.2)

## 2015-11-11 ENCOUNTER — Other Ambulatory Visit: Payer: Self-pay | Admitting: Family Medicine

## 2015-11-11 ENCOUNTER — Other Ambulatory Visit: Payer: Self-pay | Admitting: *Deleted

## 2015-11-11 DIAGNOSIS — R7989 Other specified abnormal findings of blood chemistry: Secondary | ICD-10-CM

## 2015-11-11 DIAGNOSIS — F1010 Alcohol abuse, uncomplicated: Secondary | ICD-10-CM

## 2015-11-11 DIAGNOSIS — R772 Abnormality of alphafetoprotein: Secondary | ICD-10-CM

## 2015-11-11 DIAGNOSIS — R945 Abnormal results of liver function studies: Secondary | ICD-10-CM

## 2015-11-12 ENCOUNTER — Other Ambulatory Visit: Payer: BLUE CROSS/BLUE SHIELD

## 2015-11-13 ENCOUNTER — Encounter: Payer: Self-pay | Admitting: Family Medicine

## 2015-11-19 ENCOUNTER — Ambulatory Visit
Admission: RE | Admit: 2015-11-19 | Discharge: 2015-11-19 | Disposition: A | Discharge status: Home / Self Care | Payer: BLUE CROSS/BLUE SHIELD | Source: Ambulatory Visit | Attending: Family Medicine | Admitting: Family Medicine

## 2015-11-19 DIAGNOSIS — R221 Localized swelling, mass and lump, neck: Secondary | ICD-10-CM

## 2015-11-19 DIAGNOSIS — J069 Acute upper respiratory infection, unspecified: Secondary | ICD-10-CM

## 2015-11-19 MED ORDER — IOPAMIDOL (ISOVUE-300) INJECTION 61%
75.0000 mL | Freq: Once | INTRAVENOUS | Status: AC | PRN
  Administered 2015-11-19: 75 mL via INTRAVENOUS

## 2015-11-20 ENCOUNTER — Encounter: Payer: Self-pay | Admitting: Family Medicine

## 2015-11-21 ENCOUNTER — Encounter: Payer: Self-pay | Admitting: Family Medicine

## 2015-11-25 ENCOUNTER — Encounter: Payer: Self-pay | Admitting: Family Medicine

## 2015-11-27 ENCOUNTER — Telehealth: Payer: Self-pay | Admitting: *Deleted

## 2015-11-27 NOTE — Telephone Encounter (Signed)
Received e-mail from patient requesting FMLA forms.   Call placed to patient for more information.   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   Forms routed to provider.

## 2015-12-02 ENCOUNTER — Encounter: Payer: Self-pay | Admitting: *Deleted

## 2015-12-02 ENCOUNTER — Ambulatory Visit
Admission: RE | Admit: 2015-12-02 | Discharge: 2015-12-02 | Disposition: A | Discharge status: Home / Self Care | Payer: BLUE CROSS/BLUE SHIELD | Source: Ambulatory Visit | Attending: Family Medicine | Admitting: Family Medicine

## 2015-12-02 DIAGNOSIS — R772 Abnormality of alphafetoprotein: Secondary | ICD-10-CM

## 2015-12-02 DIAGNOSIS — R7989 Other specified abnormal findings of blood chemistry: Secondary | ICD-10-CM

## 2015-12-02 DIAGNOSIS — R945 Abnormal results of liver function studies: Secondary | ICD-10-CM

## 2015-12-02 DIAGNOSIS — F1010 Alcohol abuse, uncomplicated: Secondary | ICD-10-CM

## 2015-12-02 MED ORDER — GADOBENATE DIMEGLUMINE 529 MG/ML IV SOLN
12.0000 mL | Freq: Once | INTRAVENOUS | Status: AC | PRN
  Administered 2015-12-02: 12 mL via INTRAVENOUS

## 2015-12-02 NOTE — Telephone Encounter (Signed)
Received completed FMLA forms from provider.   Simple form fee charged per provider. Patient contacted to pay fee.   Will fax after payment made.

## 2015-12-05 ENCOUNTER — Encounter: Payer: Self-pay | Admitting: Family Medicine

## 2015-12-16 ENCOUNTER — Encounter: Payer: Self-pay | Admitting: Family Medicine

## 2015-12-23 ENCOUNTER — Ambulatory Visit: Payer: Self-pay | Admitting: Family Medicine

## 2015-12-23 ENCOUNTER — Encounter: Payer: Self-pay | Admitting: Family Medicine

## 2015-12-23 ENCOUNTER — Ambulatory Visit (INDEPENDENT_AMBULATORY_CARE_PROVIDER_SITE_OTHER): Payer: BLUE CROSS/BLUE SHIELD | Admitting: Family Medicine

## 2015-12-23 VITALS — BP 130/74 | HR 80 | Temp 98.3°F | Resp 14 | Ht 63.0 in | Wt 139.0 lb

## 2015-12-23 DIAGNOSIS — N3001 Acute cystitis with hematuria: Secondary | ICD-10-CM | POA: Diagnosis not present

## 2015-12-23 DIAGNOSIS — S8991XA Unspecified injury of right lower leg, initial encounter: Secondary | ICD-10-CM | POA: Diagnosis not present

## 2015-12-23 LAB — URINALYSIS, MICROSCOPIC ONLY
Bacteria, UA: NONE SEEN [HPF]
CASTS: NONE SEEN [LPF]
CRYSTALS: NONE SEEN [HPF]
Yeast: NONE SEEN [HPF]

## 2015-12-23 LAB — URINALYSIS, ROUTINE W REFLEX MICROSCOPIC
Bilirubin Urine: NEGATIVE
Ketones, ur: NEGATIVE
LEUKOCYTES UA: NEGATIVE
NITRITE: NEGATIVE
PH: 5.5 (ref 5.0–8.0)
Protein, ur: NEGATIVE
SPECIFIC GRAVITY, URINE: 1.01 (ref 1.001–1.035)

## 2015-12-23 NOTE — Patient Instructions (Addendum)
Change Cipro to 500mg  twice a day  ICE your knee , use the topical or naprosyn  Farxiga causes the glucose in your urine  Increase water  Go to Dr. Lorin Mercy if knee not improved

## 2015-12-23 NOTE — Progress Notes (Signed)
   Subjective:    Patient ID: Stacy [name], female    DOB: 23-Nov-1965, 50 y.o.   MRN: [id number]  Patient presents for Dysuria (x5 days- [facility] and [facility]- has been placed on cipro x 7 days)  Patient here with urinary frequency dysuria pain in her right flank radiating into her groin which started 4 days ago. She was seen at the [facility] her urinalysis showed leukocytes as well as glucose though she is on [place] so this is normal. She also had mild ketones and blood in her urine. She was started on Cipro 250 mg twice a day. She still having some urinary pressure but she is actually have the medication for 2 days. She denies any fever no nausea or vomiting. Of note she did have MRI of her abdomen 2 weeks ago there were no renal stones noted.  She had an A1c done at work today her A1c result 7.3%  This morning she was in her dressing room and she turned her knee awkwardly since then she's had swelling and pain in her right knee. She did not fall, her orthopedic is Dr. Lorin Mercy   Review Of Systems:  GEN- denies fatigue, fever, weight loss,weakness, recent illness HEENT- denies eye drainage, change in vision, nasal discharge, CVS- denies chest pain, palpitations RESP- denies SOB, cough, wheeze ABD- denies N/V, change in stools, abd pain GU- denies dysuria, hematuria, dribbling, incontinence MSK- + joint pain, muscle aches, injury Neuro- denies headache, dizziness, syncope, seizure activity       Objective:    BP 130/74 (BP Location: Left Arm, Patient Position: Sitting, Cuff Size: Normal)   Pulse 80   Temp 98.3 F (36.8 C) (Oral)   Resp 14   Ht 5\' 3"  (1.6 m)   Wt 139 lb (63 kg)   SpO2 98%   BMI 24.62 kg/m  GEN- NAD, alert and oriented x3 HEENT- PERRL, EOMI, non injected sclera, pink conjunctiva, MMM, oropharynx clear CVS- RRR, no murmur RESP-CTAB ABD-NABS,soft,mild TTP suprapubic region, mild Right CVA tenderness  MSK- Right  knee- +swelling lateral aspect, TTP over lateral meniscal region, fair ROM decrased due to pain, ligmanets in tact , antalgic gait  Pulses- Radial  2+        Assessment & Plan:      Problem List Items Addressed This Visit    None    Visit Diagnoses    Acute cystitis with hematuria    -  Primary   Doubt stone, with recent imaging unless she had extremely small one that did not show up, No fever, N/V, so if pyelonepheritis mild case, increase cipro to 500mg  BID , culture    DM- improved glucose due to medications   Relevant Orders   Urinalysis, Routine w reflex microscopic (not at [facility]) (Completed)   Urine culture   Injury of right knee, initial encounter       Just occured a few hours ago, ICE, Elevate topical NSAID or naprosyn at bedtime, possible meniscal tear based on description, if not better in 1 week to see ortho      Note: This dictation was prepared with Dragon dictation along with smaller phrase technology. Any transcriptional errors that result from this process are unintentional.

## 2015-12-25 LAB — URINE CULTURE: Organism ID, Bacteria: NO GROWTH

## 2016-01-07 ENCOUNTER — Encounter: Payer: Self-pay | Admitting: Family Medicine

## 2016-01-10 IMAGING — MG MM SCREENING BREAST TOMO BILATERAL
8 series · 8 of 24 positions shown · non-contrast
Comparison: Previous exam(s).

CLINICAL DATA: Screening.

EXAM:
DIGITAL SCREENING BILATERAL MAMMOGRAM WITH 3D TOMO WITH CAD
DIGITAL BREAST TOMOSYNTHESIS
Digital breast tomosynthesis images are acquired in two projections.
These images are reviewed in combination with the digital mammogram,
confirming the findings below.

[R CC]
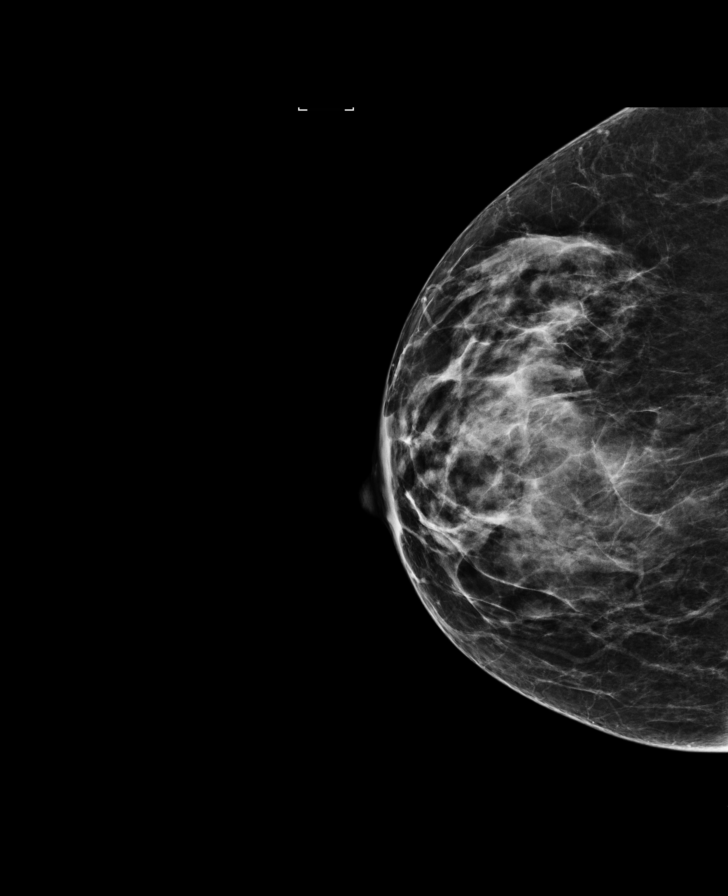

[L CC]
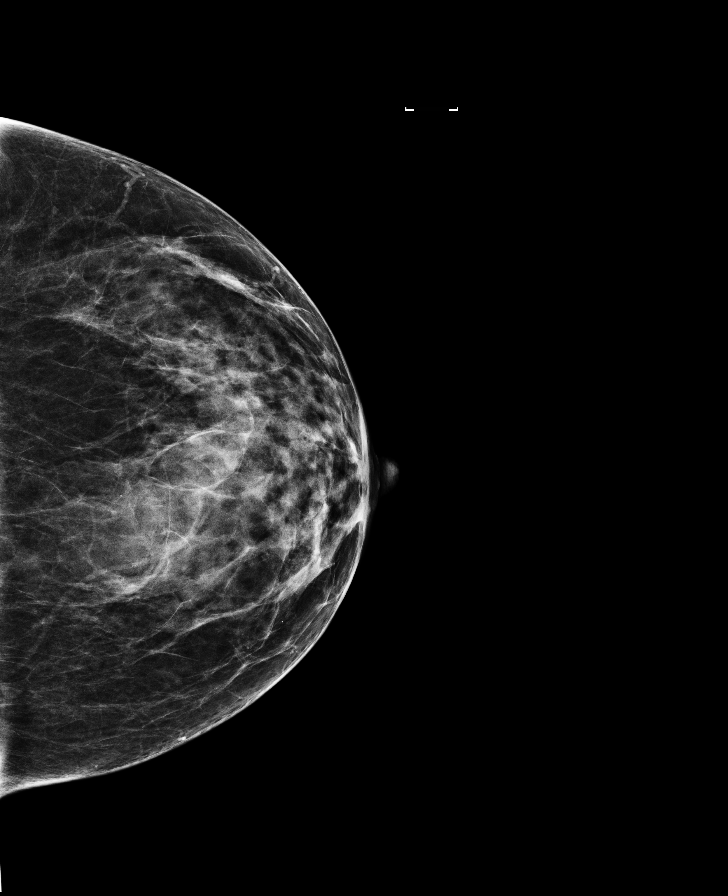

[R MLO]
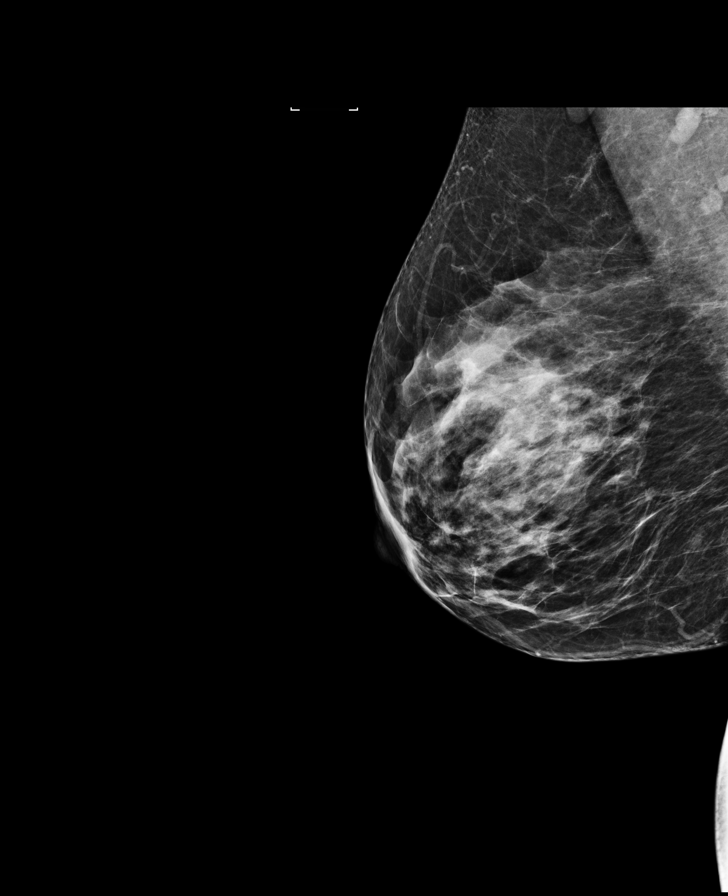

[L MLO]
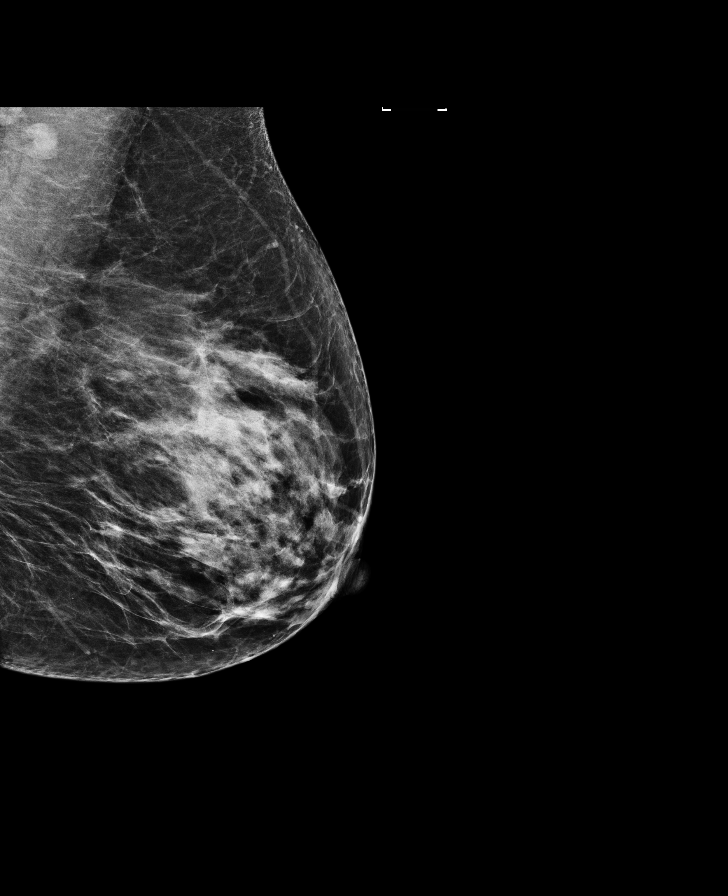

[R MLO tomo · tomo slice 31/61.0]
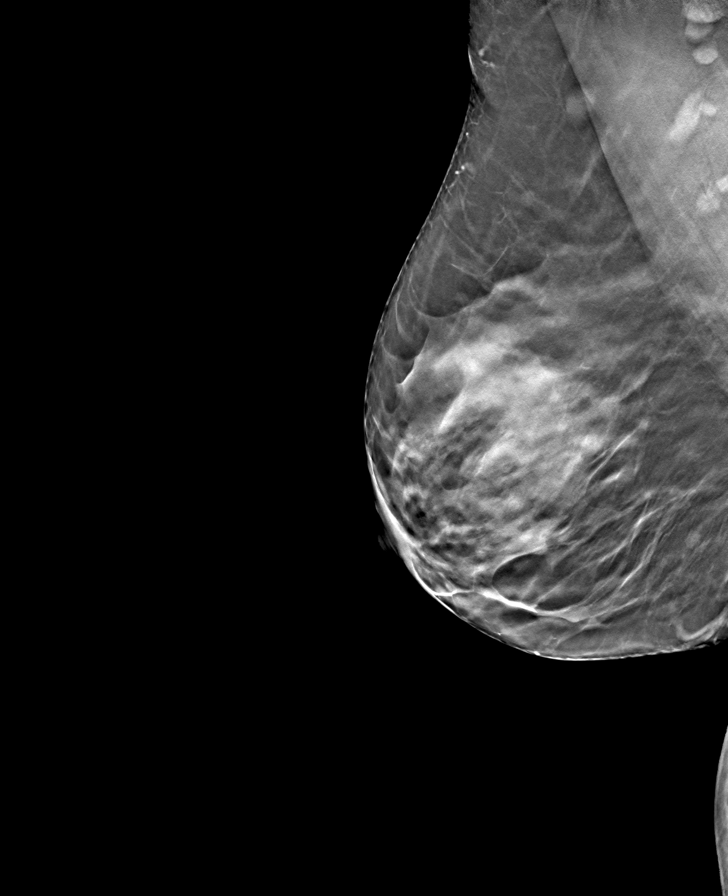

[L CC tomo · tomo slice 29/56.0]
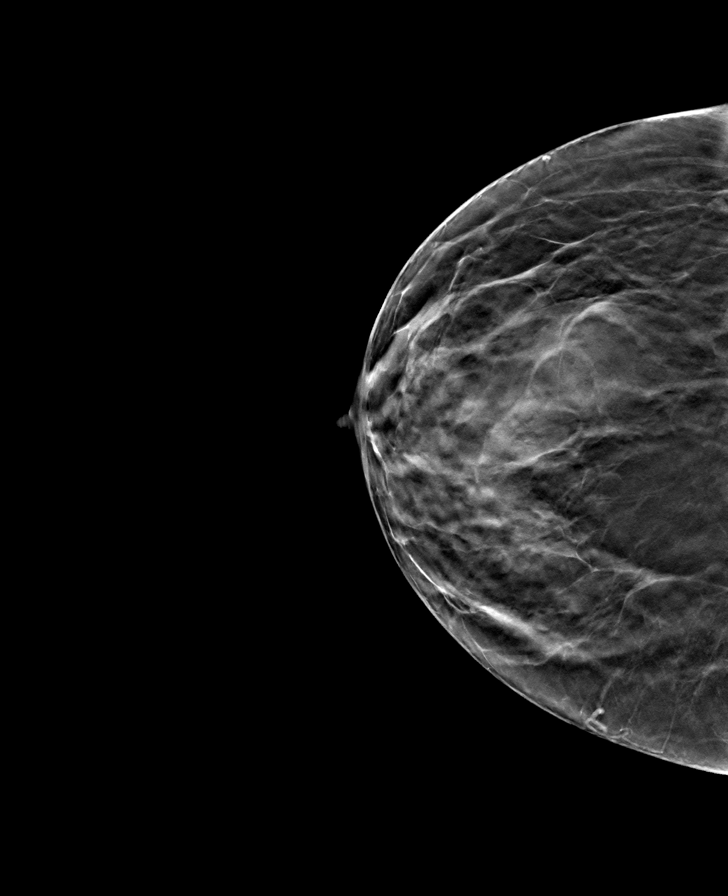

[R CC tomo · tomo slice 27/53.0]
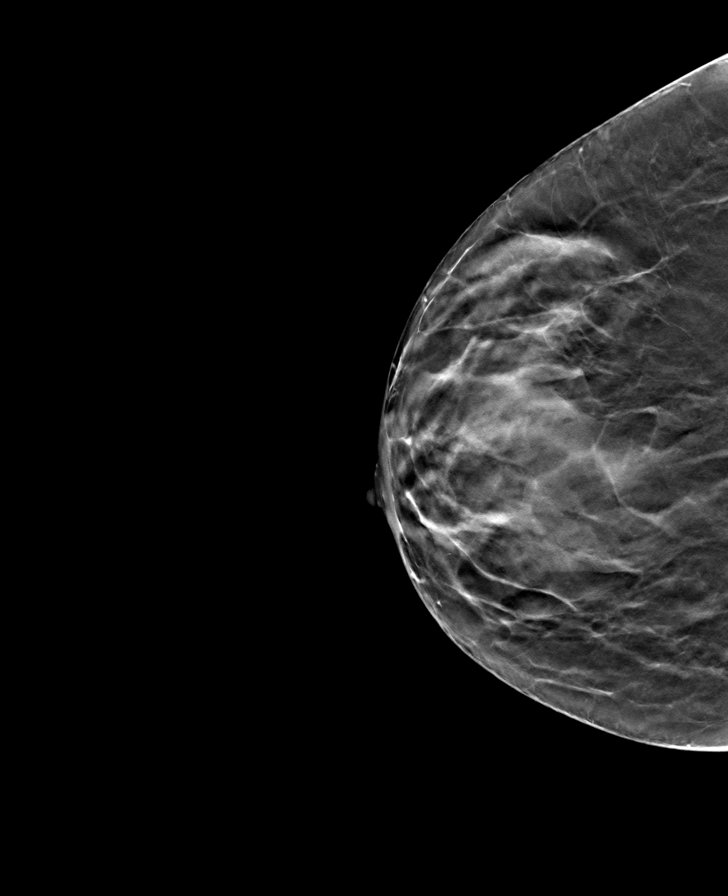

[L MLO tomo · tomo slice 33/64.0]
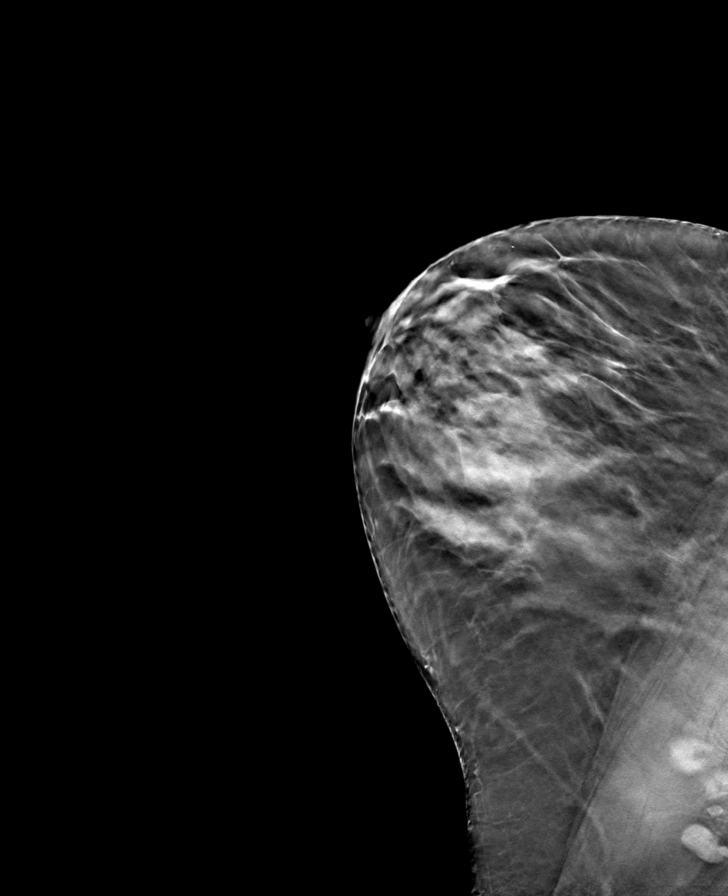

[8 of 24 positions shown; findings below may reference images not displayed]

ACR Breast Density Category c: The breast tissue is heterogeneously
dense, which may obscure small masses.
FINDINGS: There are no findings suspicious for malignancy. Images were
processed with CAD.
IMPRESSION: No mammographic evidence of malignancy. A result letter of this
screening mammogram will be mailed directly to the patient.

RECOMMENDATION:
Screening mammogram in one year. (Code:[id number])

BI-RADS CATEGORY  1: Negative.

## 2016-02-11 ENCOUNTER — Other Ambulatory Visit: Payer: Self-pay | Admitting: Family Medicine

## 2016-02-18 ENCOUNTER — Other Ambulatory Visit: Payer: Self-pay | Admitting: Family Medicine

## 2016-02-22 LAB — BASIC METABOLIC PANEL: GLUCOSE: 117 mg/dL

## 2016-05-01 ENCOUNTER — Other Ambulatory Visit: Payer: Self-pay | Admitting: *Deleted

## 2016-05-01 MED ORDER — DAPAGLIFLOZIN PROPANEDIOL 5 MG PO TABS: 5.0000 mg | ORAL_TABLET | Freq: Every day | ORAL | 2 refills | Status: DC

## 2016-06-10 LAB — HM DIABETES EYE EXAM

## 2016-06-12 ENCOUNTER — Encounter: Payer: Self-pay | Admitting: Family Medicine

## 2016-06-17 ENCOUNTER — Other Ambulatory Visit: Payer: Self-pay | Admitting: *Deleted

## 2016-06-17 MED ORDER — METFORMIN HCL ER (MOD) 1000 MG PO TB24: ORAL_TABLET | ORAL | 3 refills | Status: DC

## 2016-06-17 NOTE — Telephone Encounter (Signed)
Received fax from pharmacy.   States that Name Stacy [name] (MTF osmotic release) is not available. Requested to change to generic.   MD made aware and agreed to change.   Prescription sent to pharmacy.

## 2016-07-14 ENCOUNTER — Other Ambulatory Visit: Payer: Self-pay | Admitting: Family Medicine

## 2016-08-03 ENCOUNTER — Encounter: Payer: Self-pay | Admitting: Family Medicine

## 2016-08-03 MED ORDER — METFORMIN HCL ER (MOD) 1000 MG PO TB24: ORAL_TABLET | ORAL | 3 refills | Status: DC

## 2016-08-05 ENCOUNTER — Encounter: Payer: Self-pay | Admitting: Family Medicine

## 2016-08-06 ENCOUNTER — Encounter: Payer: Self-pay | Admitting: Family Medicine

## 2016-08-06 ENCOUNTER — Telehealth: Payer: Self-pay | Admitting: *Deleted

## 2016-08-06 NOTE — Telephone Encounter (Signed)
Received request from pharmacy for PA on [place] (MOD).  PA submitted.   Dx: E11.65- DM.  Patient noted to be non-compliant with BID dosing. Patient has increased GI upset with generic Fortamet QD (OSM).

## 2016-08-06 NOTE — Telephone Encounter (Signed)
Patient has new insurance and requires prior authorization for [employer]. PA has been submitted.   Patient has not had medication for a few days. MD please advise.

## 2016-08-06 NOTE — Telephone Encounter (Signed)
[facility] is reviewing your PA request. Typically an electronic response will be received within 72 hours. To check for an update later, open this request from your dashboard. 

## 2016-08-13 ENCOUNTER — Telehealth: Payer: Self-pay | Admitting: *Deleted

## 2016-08-13 NOTE — Telephone Encounter (Signed)
Received PA determination.   Glumetza denied.   Appeal faxed.   Per your denial, Metformin Modified Release ER requires one of the following: Trial of Metformin ER (Glucophage XR) >12 weeks AND documented history of inadequate response or intolerance, AND trial of metformin immediate release >12weeks AND documented history of inadequate response or intolerance.   The patient has tried and failed Metformin ER (Glucophage). She has increased GI upset that was unable to be resolved with attempts to minimize the adverse effects with dose reduction. The patient has tried and failed Metformin IR as evidenced by elevated A1C. The patient has been on Metformin Modified Release ER (Glumetza) for >5 years with no adverse reaction and control of DM as evidenced by A1C.

## 2016-08-13 NOTE — Telephone Encounter (Signed)
Received request from pharmacy for PA on Tresiba.  PA submitted.   Dx: E11.65- DM.  

## 2016-08-13 NOTE — Telephone Encounter (Signed)
[facility] is reviewing your PA request. Typically an electronic response will be received within 72 hours. To check for an update later, open this request from your dashboard. 

## 2016-08-14 MED ORDER — INSULIN DEGLUDEC 100 UNIT/ML ~~LOC~~ SOPN: 20.0000 [IU] | PEN_INJECTOR | Freq: Every day | SUBCUTANEOUS | 2 refills | Status: DC

## 2016-08-14 NOTE — Telephone Encounter (Signed)
Received PA determination.   PA approved 08/13/2016- 08/13/2017.  PA- [id number].  Pharmacy made aware.

## 2016-08-17 ENCOUNTER — Ambulatory Visit: Payer: Self-pay | Admitting: Family Medicine

## 2016-08-18 NOTE — Telephone Encounter (Signed)
Received determination.   Appeal approved 08/18/2016- 08/18/2017.  Pharmacy made aware.

## 2016-08-21 ENCOUNTER — Encounter: Payer: Self-pay | Admitting: Family Medicine

## 2016-09-02 ENCOUNTER — Ambulatory Visit: Payer: Self-pay | Admitting: Family Medicine

## 2016-09-02 NOTE — Progress Notes (Deleted)
   Subjective:    Patient ID: Stacy [name], female    DOB: [birth year], 51 y.o.   MRN: [id number]  Patient presents for No chief complaint on file.  Patient had a follow-up chronic medical problems specifically diabetes mellitus she is not followed up since October for her diabetes. States with her jaw she had to cancel a few appointments. She did get fasting labs done a few weeks ago at her job. She's currently on [place] , Tresiba  20 units      A1C from  7/20 7.5%  Hyperlipidemia- Lipitor , mild chronic elevated LFT  AST 52/ ALT 45 has fatty liver and alcoholic hepatitis history, plt normal  , Cholesterol TC 234, TG 250 , LDL 140 , her previous cholesterol from October 2017 is about the same her total cholesterol was 255 at that time triglycerides She did have elevated AFP in November of last year she had MRI of the liver done which does not show any evidence of any cancer or tumor but fatty liver.    HTN- valsartan HCTZ , BP have been  , needs change due to recall    Review Of Systems:  GEN- denies fatigue, fever, weight loss,weakness, recent illness HEENT- denies eye drainage, change in vision, nasal discharge, CVS- denies chest pain, palpitations RESP- denies SOB, cough, wheeze ABD- denies N/V, change in stools, abd pain GU- denies dysuria, hematuria, dribbling, incontinence MSK- denies joint pain, muscle aches, injury Neuro- denies headache, dizziness, syncope, seizure activity       Objective:    There were no vitals taken for this visit. GEN- NAD, alert and oriented x3 HEENT- PERRL, EOMI, non injected sclera, pink conjunctiva, MMM, oropharynx clear Neck- Supple, no thyromegaly CVS- RRR, no murmur RESP-CTAB ABD-NABS,soft,NT,ND EXT- No edema Pulses- Radial, DP- 2+        Assessment & Plan:      Problem List Items Addressed This Visit    None      Note: This dictation was prepared with Dragon dictation along with smaller phrase technology. Any  transcriptional errors that result from this process are unintentional.

## 2016-09-11 ENCOUNTER — Telehealth: Payer: Self-pay | Admitting: *Deleted

## 2016-09-11 NOTE — Telephone Encounter (Signed)
Received request from pharmacy for PA on Farxiga  PA submitted.   Dx: E11.65- DM 

## 2016-09-11 NOTE — Telephone Encounter (Signed)
[facility] is reviewing your PA request. Typically an electronic response will be received within 72 hours.

## 2016-09-14 NOTE — Telephone Encounter (Signed)
Received PA determination.   Stacy [name] is not covered by insurance plan.   MD please advise.

## 2016-09-15 NOTE — Telephone Encounter (Signed)
Pt needs OV, we also need updated insurance info on file Try Jardiance 25mg  daily

## 2016-09-15 NOTE — Telephone Encounter (Signed)
Call placed to patient. LMTRC.  

## 2016-09-16 MED ORDER — EMPAGLIFLOZIN 25 MG PO TABS: 25.0000 mg | ORAL_TABLET | Freq: Every day | ORAL | 0 refills | Status: DC

## 2016-09-16 NOTE — Telephone Encounter (Signed)
Call placed to patient. Vayas.   Prescription sent to pharmacy.

## 2016-11-28 LAB — HEMOGLOBIN A1C: HEMOGLOBIN A1C: 7.9

## 2016-12-03 ENCOUNTER — Other Ambulatory Visit: Payer: Self-pay | Admitting: Family Medicine

## 2016-12-04 ENCOUNTER — Other Ambulatory Visit: Payer: Self-pay | Admitting: Family Medicine

## 2016-12-04 NOTE — Telephone Encounter (Signed)
Medication refill for one time only.  Patient needs to be seen.  Letter sent for patient to call and schedule 

## 2016-12-25 ENCOUNTER — Other Ambulatory Visit: Payer: Self-pay | Admitting: Family Medicine

## 2017-01-25 ENCOUNTER — Other Ambulatory Visit: Payer: Self-pay

## 2017-01-25 ENCOUNTER — Ambulatory Visit (INDEPENDENT_AMBULATORY_CARE_PROVIDER_SITE_OTHER): Payer: Commercial Managed Care - PPO | Admitting: Family Medicine

## 2017-01-25 ENCOUNTER — Encounter: Payer: Self-pay | Admitting: Family Medicine

## 2017-01-25 VITALS — BP 132/78 | HR 84 | Temp 98.2°F | Resp 14 | Ht 63.0 in | Wt 140.0 lb

## 2017-01-25 DIAGNOSIS — I10 Essential (primary) hypertension: Secondary | ICD-10-CM

## 2017-01-25 DIAGNOSIS — Z794 Long term (current) use of insulin: Secondary | ICD-10-CM

## 2017-01-25 DIAGNOSIS — E119 Type 2 diabetes mellitus without complications: Secondary | ICD-10-CM | POA: Diagnosis not present

## 2017-01-25 DIAGNOSIS — K760 Fatty (change of) liver, not elsewhere classified: Secondary | ICD-10-CM | POA: Diagnosis not present

## 2017-01-25 DIAGNOSIS — K219 Gastro-esophageal reflux disease without esophagitis: Secondary | ICD-10-CM

## 2017-01-25 DIAGNOSIS — E7800 Pure hypercholesterolemia, unspecified: Secondary | ICD-10-CM | POA: Diagnosis not present

## 2017-01-25 DIAGNOSIS — R194 Change in bowel habit: Secondary | ICD-10-CM

## 2017-01-25 DIAGNOSIS — F418 Other specified anxiety disorders: Secondary | ICD-10-CM | POA: Diagnosis not present

## 2017-01-25 DIAGNOSIS — Z720 Tobacco use: Secondary | ICD-10-CM

## 2017-01-25 DIAGNOSIS — J209 Acute bronchitis, unspecified: Secondary | ICD-10-CM

## 2017-01-25 MED ORDER — ATORVASTATIN CALCIUM 20 MG PO TABS: 20.0000 mg | ORAL_TABLET | Freq: Every day | ORAL | 3 refills | Status: DC

## 2017-01-25 MED ORDER — METFORMIN HCL ER (MOD) 1000 MG PO TB24: ORAL_TABLET | ORAL | 3 refills | Status: DC

## 2017-01-25 MED ORDER — PREDNISONE 10 MG PO TABS: ORAL_TABLET | ORAL | 0 refills | Status: DC

## 2017-01-25 MED ORDER — EPINEPHRINE 0.3 MG/0.3ML IJ SOAJ: INTRAMUSCULAR | 1 refills | Status: AC

## 2017-01-25 MED ORDER — INSULIN DEGLUDEC 100 UNIT/ML ~~LOC~~ SOPN: 20.0000 [IU] | PEN_INJECTOR | Freq: Every day | SUBCUTANEOUS | 2 refills | Status: DC

## 2017-01-25 MED ORDER — ALBUTEROL SULFATE HFA 108 (90 BASE) MCG/ACT IN AERS
2.0000 | INHALATION_SPRAY | RESPIRATORY_TRACT | 0 refills | Status: DC | PRN
Start: 2017-01-25 — End: 2019-08-02

## 2017-01-25 MED ORDER — PANTOPRAZOLE SODIUM 40 MG PO TBEC: DELAYED_RELEASE_TABLET | ORAL | 1 refills | Status: DC

## 2017-01-25 MED ORDER — DAPAGLIFLOZIN PROPANEDIOL 5 MG PO TABS: 5.0000 mg | ORAL_TABLET | Freq: Every day | ORAL | 3 refills | Status: DC

## 2017-01-25 MED ORDER — VALSARTAN-HYDROCHLOROTHIAZIDE 320-25 MG PO TABS: 1.0000 | ORAL_TABLET | Freq: Every day | ORAL | 0 refills | Status: DC

## 2017-01-25 MED ORDER — AZITHROMYCIN 250 MG PO TABS: ORAL_TABLET | ORAL | 0 refills | Status: DC

## 2017-01-25 MED ORDER — FLUOXETINE HCL 40 MG PO CAPS: ORAL_CAPSULE | ORAL | 3 refills | Status: DC

## 2017-01-25 NOTE — Patient Instructions (Addendum)
We will call with lab results CXR - at [facility], STEROIDS, USE INHALER  F/u 3 MONTHS

## 2017-01-25 NOTE — Assessment & Plan Note (Signed)
She had lipids checked at work as she is nonfasting today.  She is on statin drug.  We will also to recheck her liver function as well as her AFP in the setting of her fatty liver disease   Regards to the change in her stools with eating we will check to make sure it is no sign of any pancreatitis we will check amylase and lipase first.

## 2017-01-25 NOTE — Assessment & Plan Note (Signed)
Uncontrolled diabetes mellitus.  Recheck her A1c.  Discussed the importance of following up regularly here in the office.  Goal is an A1c less than 7%.  Continue metformin , [place]

## 2017-01-25 NOTE — Assessment & Plan Note (Addendum)
Persistent smoker in the setting of her ongoing bronchitis which will not completely resolved.  We will start her on prednisone albuterol and give her azithromycin.  Chest x-ray to be done.  There are changes concerning for COPD will obtain pulmonary function test  Advised her that she can go up a unit on her insulin if her blood sugars are greater than 200 on the steroids

## 2017-01-25 NOTE — Assessment & Plan Note (Addendum)
She has had some increased stressors states that her mother was diagnosed with  Guyon -Barre syndrome she is now on a ventilator was previously fairly healthy.  She has had some difficulty dealing with that.  She would like to increase her Prozac we will increase to 40 mg daily.

## 2017-01-25 NOTE — Progress Notes (Signed)
Subjective:    Patient ID: Stacy [name], female    DOB: 08-27-1965, 52 y.o.   MRN: [id number]  Patient presents for Follow-up (is not fasting) and Cough (x weeks- productive cough with cleaar mucus- has had fever with cough but has resoved)   Pt here to f/u chronic medical problems, last seen 1 year ago.   Bronchitis on and off since Oct, has had cough with production, continues to smoke 8-10 cig a day. GTaking OTC medications. No antibipotics, has been wheezing    DM- uncontrolled last A1C done at work 7.9% in Nov 2018, CBG fasting run 130's  Injecting 22 units Tresiba, Farxiga , Taking Glumetaza      Hyperlipidemia- last Cholesterol panel June 2018  TC 234 TG 250 LDL 140, als ohas FLD with chronic LFT elevation.  She also states that she has noted some oily stools in the past few months.  Denies any blood in the stool.   HTN- taking valsartan HCTZ   Migraines- uses imitrex as needed   [place]- stress- taking proozac  20mg  mother has been recently sick diagnosed with Guyon Barr syndrome  GERD- taking Protonix   Flu shot done  Mammogram appointment scheduled   Review Of Systems:  GEN- denies fatigue, fever, weight loss,weakness, recent illness HEENT- denies eye drainage, change in vision, nasal discharge, CVS- denies chest pain, palpitations RESP- denies SOB, +cough, +wheeze ABD- denies N/V, change in stools, abd pain GU- denies dysuria, hematuria, dribbling, incontinence MSK- denies joint pain, muscle aches, injury Neuro- denies headache, dizziness, syncope, seizure activity       Objective:    BP 132/78   Pulse 84   Temp 98.2 F (36.8 C) (Oral)   Resp 14   Ht 5\' 3"  (1.6 m)   Wt 140 lb (63.5 kg)   SpO2 97%   BMI 24.80 kg/m  GEN- NAD, alert and oriented x3 HEENT- PERRL, EOMI, non injected sclera, pink conjunctiva, MMM, oropharynx clear Neck- Supple, no thyromegaly, no LAD CVS- RRR, no murmur RESP- bilat wheeze, upper aiway congestion, normal WOB,  bronchitc cough  ABD-NABS,soft,NT,ND EXT- No edema Pulses- Radial  2+        Assessment & Plan:      Problem List Items Addressed This Visit      Unprioritized   GERD (gastroesophageal reflux disease)   Relevant Medications   pantoprazole (PROTONIX) 40 MG tablet   Hypertension   Relevant Medications   valsartan-hydrochlorothiazide (DIOVAN-HCT) 320-25 MG tablet   EPINEPHrine 0.3 mg/0.3 mL IJ SOAJ injection   atorvastatin (LIPITOR) 20 MG tablet   Tobacco use    Persistent smoker in the setting of her ongoing bronchitis which will not completely resolved.  We will start her on prednisone albuterol and give her azithromycin.  Chest x-ray to be done.  There are changes concerning for COPD will obtain pulmonary function test  Advised her that she can go up a unit on her insulin if her blood sugars are greater than 200 on the steroids      Hyperlipemia    She had lipids checked at work as she is nonfasting today.  She is on statin drug.  We will also to recheck her liver function as well as her AFP in the setting of her fatty liver disease   Regards to the change in her stools with eating we will check to make sure it is no sign of any pancreatitis we will check amylase and lipase first.  Relevant Medications   valsartan-hydrochlorothiazide (DIOVAN-HCT) 320-25 MG tablet   EPINEPHrine 0.3 mg/0.3 mL IJ SOAJ injection   atorvastatin (LIPITOR) 20 MG tablet   Diabetes mellitus, type II, insulin dependent ([place]) - Primary    Uncontrolled diabetes mellitus.  Recheck her A1c.  Discussed the importance of following up regularly here in the office.  Goal is an A1c less than 7%.  Continue metformin , Farxiga and Tresiba      Relevant Medications   dapagliflozin propanediol (FARXIGA) 5 MG TABS tablet   valsartan-hydrochlorothiazide (DIOVAN-HCT) 320-25 MG tablet   metFORMIN (GLUMETZA) 1000 MG (MOD) 24 hr tablet   atorvastatin (LIPITOR) 20 MG tablet   insulin degludec (TRESIBA  FLEXTOUCH) 100 UNIT/ML SOPN FlexTouch Pen   Other Relevant Orders   CBC with Differential/Platelet   Comprehensive metabolic panel   Hemoglobin A1c   Depression with anxiety    She has had some increased stressors states that her mother was diagnosed with  Guyon -Barre syndrome she is now on a ventilator was previously fairly healthy.  She has had some difficulty dealing with that.  She would like to increase her Prozac we will increase to 40 mg daily.      Relevant Medications   FLUoxetine (PROZAC) 40 MG capsule    Other Visit Diagnoses    Acute bronchitis, unspecified organism       Relevant Orders   DG Chest 2 View   NAFLD (nonalcoholic fatty liver disease)       Relevant Orders   AFP tumor marker   Bowel habit changes       Relevant Orders   Lipase   Amylase      Note: This dictation was prepared with Dragon dictation along with smaller phrase technology. Any transcriptional errors that result from this process are unintentional.

## 2017-01-26 LAB — CBC WITH DIFFERENTIAL/PLATELET
BASOS PCT: 0.5 %
Basophils Absolute: 46 cells/uL (ref 0–200)
Eosinophils Absolute: 164 cells/uL (ref 15–500)
Eosinophils Relative: 1.8 %
HCT: 37.2 % (ref 35.0–45.0)
Hemoglobin: 13.3 g/dL (ref 11.7–15.5)
Lymphs Abs: 2994 cells/uL (ref 850–3900)
MCH: 30.4 pg (ref 27.0–33.0)
MCHC: 35.8 g/dL (ref 32.0–36.0)
MCV: 84.9 fL (ref 80.0–100.0)
MONOS PCT: 4.9 %
MPV: 10.3 fL (ref 7.5–12.5)
Neutro Abs: 5451 cells/uL (ref 1500–7800)
Neutrophils Relative %: 59.9 %
PLATELETS: 309 10*3/uL (ref 140–400)
RBC: 4.38 10*6/uL (ref 3.80–5.10)
RDW: 14.4 % (ref 11.0–15.0)
TOTAL LYMPHOCYTE: 32.9 %
WBC mixed population: 446 cells/uL (ref 200–950)
WBC: 9.1 10*3/uL (ref 3.8–10.8)

## 2017-01-26 LAB — COMPREHENSIVE METABOLIC PANEL
AG Ratio: 1.2 (calc) (ref 1.0–2.5)
ALBUMIN MSPROF: 3.8 g/dL (ref 3.6–5.1)
ALT: 53 U/L — ABNORMAL HIGH (ref 6–29)
AST: 77 U/L — AB (ref 10–35)
Alkaline phosphatase (APISO): 89 U/L (ref 33–130)
BUN: 13 mg/dL (ref 7–25)
CHLORIDE: 100 mmol/L (ref 98–110)
CO2: 30 mmol/L (ref 20–32)
CREATININE: 0.7 mg/dL (ref 0.50–1.05)
Calcium: 9.3 mg/dL (ref 8.6–10.4)
GLOBULIN: 3.3 g/dL (ref 1.9–3.7)
GLUCOSE: 133 mg/dL — AB (ref 65–99)
Potassium: 3.8 mmol/L (ref 3.5–5.3)
Sodium: 139 mmol/L (ref 135–146)
TOTAL PROTEIN: 7.1 g/dL (ref 6.1–8.1)
Total Bilirubin: 0.4 mg/dL (ref 0.2–1.2)

## 2017-01-26 LAB — AMYLASE: AMYLASE: 76 U/L (ref 21–101)

## 2017-01-26 LAB — HEMOGLOBIN A1C
Hgb A1c MFr Bld: 7.6 % of total Hgb — ABNORMAL HIGH (ref <5.7)
MEAN PLASMA GLUCOSE: 171 (calc)
eAG (mmol/L): 9.5 (calc)

## 2017-01-26 LAB — AFP TUMOR MARKER: AFP-Tumor Marker: 6.7 ng/mL — ABNORMAL HIGH

## 2017-01-26 LAB — LIPASE: LIPASE: 33 U/L (ref 7–60)

## 2017-01-27 ENCOUNTER — Other Ambulatory Visit: Payer: Self-pay | Admitting: *Deleted

## 2017-01-27 ENCOUNTER — Encounter: Payer: Self-pay | Admitting: Family Medicine

## 2017-01-27 DIAGNOSIS — K760 Fatty (change of) liver, not elsewhere classified: Secondary | ICD-10-CM

## 2017-01-27 DIAGNOSIS — R772 Abnormality of alphafetoprotein: Secondary | ICD-10-CM

## 2017-01-27 MED ORDER — INSULIN DEGLUDEC 100 UNIT/ML ~~LOC~~ SOPN: 24.0000 [IU] | PEN_INJECTOR | Freq: Every day | SUBCUTANEOUS | 2 refills | Status: DC

## 2017-02-03 ENCOUNTER — Other Ambulatory Visit: Payer: Self-pay | Admitting: *Deleted

## 2017-02-03 DIAGNOSIS — R945 Abnormal results of liver function studies: Secondary | ICD-10-CM

## 2017-02-03 DIAGNOSIS — K760 Fatty (change of) liver, not elsewhere classified: Secondary | ICD-10-CM

## 2017-02-03 DIAGNOSIS — R772 Abnormality of alphafetoprotein: Secondary | ICD-10-CM

## 2017-02-03 DIAGNOSIS — R7989 Other specified abnormal findings of blood chemistry: Secondary | ICD-10-CM

## 2017-02-04 ENCOUNTER — Telehealth: Payer: Self-pay | Admitting: *Deleted

## 2017-02-04 NOTE — Telephone Encounter (Signed)
[facility] is reviewing your PA request. Typically an electronic response will be received within 72 hours. To check for an update later, open this request from your dashboard.    You may close this dialog and return to your dashboard to perform other tasks.

## 2017-02-04 NOTE — Telephone Encounter (Signed)
Received request from pharmacy for [place] on Farxiga..   PA submitted.   Dx: E11.65- DM.  Patient has tried and failed: Metformin Jardiance

## 2017-02-05 ENCOUNTER — Ambulatory Visit (HOSPITAL_COMMUNITY)
Admission: RE | Admit: 2017-02-05 | Discharge: 2017-02-05 | Disposition: A | Discharge status: Home / Self Care | Payer: Commercial Managed Care - PPO | Source: Ambulatory Visit | Attending: Family Medicine | Admitting: Family Medicine

## 2017-02-05 DIAGNOSIS — F17200 Nicotine dependence, unspecified, uncomplicated: Secondary | ICD-10-CM | POA: Insufficient documentation

## 2017-02-05 DIAGNOSIS — J209 Acute bronchitis, unspecified: Secondary | ICD-10-CM

## 2017-02-11 NOTE — Telephone Encounter (Signed)
Received PA determination.   Stacy [name] is not covered by plan. Formulary alternatives are as follows: [name]  MD please advise.

## 2017-02-12 ENCOUNTER — Encounter: Payer: Self-pay | Admitting: Family Medicine

## 2017-02-12 MED ORDER — EMPAGLIFLOZIN 25 MG PO TABS: 25.0000 mg | ORAL_TABLET | Freq: Every day | ORAL | 3 refills | Status: DC

## 2017-02-12 NOTE — Telephone Encounter (Signed)
I dont recall her being on Jardiance, you can check with her but I dont see anything in her notes Jardiance 25mg  once a day

## 2017-02-12 NOTE — Telephone Encounter (Signed)
Medication was noted under history.   Patient reports that she thinks she may have tried medication but is not positive. Patient is agreeable to medication change.   Prescription sent to pharmacy.

## 2017-02-18 ENCOUNTER — Encounter: Payer: Self-pay | Admitting: *Deleted

## 2017-02-19 ENCOUNTER — Other Ambulatory Visit: Payer: Self-pay | Admitting: *Deleted

## 2017-02-19 MED ORDER — FLUOXETINE HCL 40 MG PO CAPS: ORAL_CAPSULE | ORAL | 3 refills | Status: DC

## 2017-02-19 MED ORDER — METFORMIN HCL ER (MOD) 1000 MG PO TB24: ORAL_TABLET | ORAL | 3 refills | Status: DC

## 2017-02-22 ENCOUNTER — Telehealth: Payer: Self-pay | Admitting: *Deleted

## 2017-02-22 ENCOUNTER — Other Ambulatory Visit: Payer: Self-pay | Admitting: Family Medicine

## 2017-02-22 MED ORDER — FLUOXETINE HCL 40 MG PO CAPS: ORAL_CAPSULE | ORAL | 3 refills | Status: DC

## 2017-02-22 MED ORDER — METFORMIN HCL ER (OSM) 1000 MG PO TB24: 1000.0000 mg | ORAL_TABLET | Freq: Every day | ORAL | 3 refills | Status: DC

## 2017-02-22 NOTE — Telephone Encounter (Signed)
Received request from pharmacy for PA on Metformin ER MOD (Glumetza).   PA submitted.   Dx: E11.65- DM.  The patient has tried and failed Metformin ER (Glucophage). She has increased GI upset that was unable to be resolved with attempts to minimize the adverse effects with dose reduction. The patient has tried and failed Metformin IR as evidenced by elevated A1C. The patient has been on Metformin Modified Release ER (Glumetza) for >5 years with no adverse reaction and control of DM as evidenced by A1C.

## 2017-02-22 NOTE — Telephone Encounter (Signed)
Try sending in Metformin 1000mg  ER not specifically Glumetza If this does not work, will need to take Metformin 850 mg BID Immediate release

## 2017-02-22 NOTE — Telephone Encounter (Signed)
Received immediate determination.   PA denied.   The requested medication is not covered by the plan.  MD please advise.

## 2017-02-22 NOTE — Telephone Encounter (Signed)
Metformin ER is either MOD (Glumetza) or OSM (Fortamet).   Prescription sent to pharmacy.

## 2017-02-24 ENCOUNTER — Encounter: Payer: Self-pay | Admitting: Internal Medicine

## 2017-02-24 ENCOUNTER — Ambulatory Visit (INDEPENDENT_AMBULATORY_CARE_PROVIDER_SITE_OTHER): Payer: Commercial Managed Care - PPO | Admitting: Internal Medicine

## 2017-02-24 ENCOUNTER — Other Ambulatory Visit (INDEPENDENT_AMBULATORY_CARE_PROVIDER_SITE_OTHER): Payer: Commercial Managed Care - PPO

## 2017-02-24 VITALS — BP 136/82 | HR 100 | Ht 63.0 in | Wt 140.0 lb

## 2017-02-24 DIAGNOSIS — Z7289 Other problems related to lifestyle: Secondary | ICD-10-CM

## 2017-02-24 DIAGNOSIS — R945 Abnormal results of liver function studies: Secondary | ICD-10-CM

## 2017-02-24 DIAGNOSIS — K760 Fatty (change of) liver, not elsewhere classified: Secondary | ICD-10-CM

## 2017-02-24 DIAGNOSIS — Z86010 Personal history of colonic polyps: Secondary | ICD-10-CM | POA: Diagnosis not present

## 2017-02-24 DIAGNOSIS — R7989 Other specified abnormal findings of blood chemistry: Secondary | ICD-10-CM

## 2017-02-24 DIAGNOSIS — Z789 Other specified health status: Secondary | ICD-10-CM

## 2017-02-24 DIAGNOSIS — F1090 Alcohol use, unspecified, uncomplicated: Secondary | ICD-10-CM

## 2017-02-24 DIAGNOSIS — R195 Other fecal abnormalities: Secondary | ICD-10-CM | POA: Diagnosis not present

## 2017-02-24 LAB — PROTIME-INR
INR: 1 ratio (ref 0.8–1.0)
Prothrombin Time: 10.5 s (ref 9.6–13.1)

## 2017-02-24 LAB — HEPATIC FUNCTION PANEL
ALK PHOS: 104 U/L (ref 39–117)
ALT: 48 U/L — AB (ref 0–35)
AST: 46 U/L — ABNORMAL HIGH (ref 0–37)
Albumin: 4 g/dL (ref 3.5–5.2)
Bilirubin, Direct: 0.2 mg/dL (ref 0.0–0.3)
TOTAL PROTEIN: 7.8 g/dL (ref 6.0–8.3)
Total Bilirubin: 0.8 mg/dL (ref 0.2–1.2)

## 2017-02-24 NOTE — Progress Notes (Signed)
Patient ID: Stacy [name], female   DOB: 05-20-1965, 52 y.o.   MRN: [id number] HPI: Stacy [name] is a 52 year old female with a past medical history of adenomatous colon polyps, GERD with large hiatal hernia, colonic diverticulosis, hypertension, hyperlipidemia, diabetes who is seen in consultation at the request of Dr. Buelah Manis to evaluate elevated liver enzymes.  She also has a history of a positive hepatitis C antibody with negative RNA and a history of alcohol use/abuse.  She is here alone today.  I saw her a little over 4 years ago to evaluate reflux and loose stools.  This led to an upper endoscopy and colonoscopy performed in December 2014.  Upper endoscopy showed a 5 cm hiatal hernia and was otherwise no colonoscopy showed 4 small polyps in the transverse, sigmoid and rectosigmoid colon.  1 of these was tubular adenoma.  There was mild diverticulosis in the ascending and sigmoid colon.  She reports that her liver enzymes have remain elevated and she was told that she has a fatty liver.  She has had a previous MRI of the abdomen confirming fatty liver disease.  She admits that she is drinking "more than I should" and has been doing so since the death of her husband in 64.  Her husband died unexpectedly from heart disease.  She is also recently had stress with her mother who developed Guillain-Barr syndrome after a viral infection.  She is now in a skilled nursing facility with a trach and ventilator.  This is in [place] and also caused discontent with her siblings.  She is drinking liquor, usually [employer], 2 drinks each night.  She states it takes her about 1 week to go through 1/5 of liquor.  She states she does this in the evenings as an "escape" and also to help with her sleep.  She states that she does have feelings of loneliness and depressed mood.  She denies SI and HI.  When she is out socially or around family she does not drink alcohol.  She was previously seeing a therapist which she  found to be a very positive and helpful relationship but has not done so recently due to financial constraints.  Dr. Buelah Manis increased her Prozac from 20-40 mg daily about 2-3 weeks ago.  To this point she is not been able to tell much difference.  She denies jaundice, itching, ascites and lower extremity edema.  No bleeding.  No confusion.  No abdominal pain.  She does have some lower back pain which she describes as "annoying".  Notices most when she is sitting and while driving.  No recent trauma though she does have a history of L5/S1 disc disease but states this pain feels somewhat different.  Bowel movements remain fairly frequent as often as 3 times per day and can be loose.  Denies blood in her stool and melena.  She does take long-acting metformin and will be transitioning to Jardiance because the long-acting metformin was not covered by her insurance.  Family history of cirrhosis felt secondary to heart disease in her maternal aunt.  She was seen at [facility] liver care and 2016 for her elevated liver enzymes.  At that point her enzymes were felt secondary to alcohol.  Her hepatitis C antibody was positive but followed up with a negative RNA.   Past Medical History:  Diagnosis Date  . Adrenal adenoma    as per MRI 03/09/14  . Anxiety   . Diabetes mellitus   . Diverticulosis   .  Elevated LFTs    elevated since 09/2013  . Fatty liver    moderate- as per 03/06/17 MRI liver  . GERD (gastroesophageal reflux disease)   . Gout   . H/O hiatal hernia   . Headache(784.0)   . History of depression   . Hyperlipemia   . Hyperplastic colon polyp   . Hypertension   . Positive hepatitis C antibody test    RNA NEGATIVE  . Thyroid nodule  managed Dr. Posey Pronto 02/27/2014  . Tobacco abuse   . Tubular adenoma of colon     Past Surgical History:  Procedure Laterality Date  . ANAL FISTULECTOMY  01/19/1985  . BREAST LUMPECTOMY Left 1988  . PAROTIDECTOMY  03/30/2011   Procedure: PAROTIDECTOMY;  Surgeon:  Izora Gala, MD;  Location: [place];  Service: ENT;  Laterality: Right;  . WRIST GANGLION EXCISION Left 1988    Outpatient Medications Prior to Visit  Medication Sig Dispense Refill  . albuterol (PROVENTIL HFA;VENTOLIN HFA) 108 (90 Base) MCG/ACT inhaler Inhale 2 puffs into the lungs every 4 (four) hours as needed for wheezing or shortness of breath. 1 Inhaler 0  . atorvastatin (LIPITOR) 20 MG tablet Take 1 tablet (20 mg total) by mouth daily. 90 tablet 3  . empagliflozin (JARDIANCE) 25 MG TABS tablet Take 25 mg by mouth daily. 90 tablet 3  . EPINEPHrine 0.3 mg/0.3 mL IJ SOAJ injection Insert into muscle once with any facial or neck swelling  Instruct in use 1 Device 1  . FLUoxetine (PROZAC) 40 MG capsule TAKE 1 CAPSULE (20 MG TOTAL) BY MOUTH DAILY. 30 capsule 3  . glucose blood test strip Use as instructed to monitor FSBS 3x daily. Dx: E11.65 200 each 12  . insulin degludec (TRESIBA FLEXTOUCH) 100 UNIT/ML SOPN FlexTouch Pen Inject 0.24 mLs (24 Units total) into the skin at bedtime. 15 pen 2  . Lancets MISC Use as instructed to monitor FSBS 3x daily. Dx: E11.65 100 each 11  . metformin (FORTAMET) 1000 MG (OSM) 24 hr tablet Take 1 tablet (1,000 mg total) by mouth daily with breakfast. 90 tablet 3  . Multiple Vitamins-Minerals (ONE-A-DAY WOMENS VITACRAVES) CHEW Chew 1 each by mouth daily.    . pantoprazole (PROTONIX) 40 MG tablet TAKE 1 TABLET (40 MG TOTAL) BY MOUTH DAILY. 90 tablet 1  . SUMAtriptan (IMITREX) 100 MG tablet Take 1 tablet (100 mg total) by mouth every 2 (two) hours as needed for migraine. May repeat in 2 hours if headache persists or recurs. 9 tablet 0  . valsartan-hydrochlorothiazide (DIOVAN-HCT) 320-25 MG tablet Take 1 tablet by mouth daily. 30 tablet 0  . azithromycin (ZITHROMAX) 250 MG tablet Take 2 tablets x 1 day, then 1 tab daily for 4 days 6 tablet 0  . predniSONE (DELTASONE) 10 MG tablet Take 40mg  x 2 days,20mg  x 2 days, 10mg  x 2 days 14 tablet 0   No  facility-administered medications prior to visit.     Allergies  Allergen Reactions  . Almond Meal Anaphylaxis and Swelling  . Aspirin Nausea Only    Family History  Problem Relation Age of Onset  . Coronary artery disease Father 78  . Hypertension Mother   . Diabetes Sister   . Cancer Sister 48       uterine cancer    Social History   Tobacco Use  . Smoking status: Current Every Day Smoker    Packs/day: 1.00    Years: 28.00    Pack years: 28.00  Types: Cigarettes  . Smokeless tobacco: Never Used  Substance Use Topics  . Alcohol use: Yes    Alcohol/week: 7.0 oz    Types: 14 Standard drinks or equivalent per week  . Drug use: No    ROS: As per history of present illness, otherwise negative  BP 136/82   Pulse 100   Ht 5\' 3"  (1.6 m)   Wt 140 lb (63.5 kg)   BMI 24.80 kg/m  Constitutional: Well-developed and well-nourished. No distress. HEENT: Normocephalic and atraumatic. Oropharynx is clear and moist. Conjunctivae are normal.  No scleral icterus. Neck: Neck supple. Trachea midline. Cardiovascular: Normal rate, regular rhythm and intact distal pulses. No M/R/G Pulmonary/chest: Effort normal and breath sounds normal. No wheezing, rales or rhonchi. Abdominal: Soft, nontender, nondistended. Bowel sounds active throughout. There are no masses palpable. No hepatosplenomegaly. Extremities: no clubbing, cyanosis, or edema Neurological: Alert and oriented to person place and time. Skin: Skin is warm and dry.  Psychiatric: Normal mood and affect. Behavior is normal.  RELEVANT LABS AND IMAGING: CBC    Component Value Date/Time   WBC 9.1 01/25/2017 1441   RBC 4.38 01/25/2017 1441   HGB 13.3 01/25/2017 1441   HCT 37.2 01/25/2017 1441   PLT 309 01/25/2017 1441   MCV 84.9 01/25/2017 1441   MCH 30.4 01/25/2017 1441   MCHC 35.8 01/25/2017 1441   RDW 14.4 01/25/2017 1441   LYMPHSABS 2,994 01/25/2017 1441   MONOABS 368 11/06/2015 1501   EOSABS 164 01/25/2017 1441    BASOSABS 46 01/25/2017 1441    CMP     Component Value Date/Time   NA 139 01/25/2017 1441   K 3.8 01/25/2017 1441   CL 100 01/25/2017 1441   CO2 30 01/25/2017 1441   GLUCOSE 133 (H) 01/25/2017 1441   BUN 13 01/25/2017 1441   CREATININE 0.70 01/25/2017 1441   CALCIUM 9.3 01/25/2017 1441   PROT 7.1 01/25/2017 1441   ALBUMIN 3.9 08/05/2015 1430   AST 77 (H) 01/25/2017 1441   ALT 53 (H) 01/25/2017 1441   ALKPHOS 77 08/05/2015 1430   BILITOT 0.4 01/25/2017 1441   GFRNONAA >60 10/29/2014 1300   GFRNONAA 67 02/01/2014 1401   GFRAA >60 10/29/2014 1300   GFRAA 77 02/01/2014 1401   CLINICAL DATA:  Elevated liver function studies. History of alcohol abuse.   EXAM: MRI ABDOMEN WITHOUT AND WITH CONTRAST   TECHNIQUE: Multiplanar multisequence MR imaging of the abdomen was performed both before and after the administration of intravenous contrast.   CONTRAST:  41mL MULTIHANCE GADOBENATE DIMEGLUMINE 529 MG/ML IV SOLN   COMPARISON:  MRI 03/09/2014   FINDINGS: Lower chest: The lung bases are grossly clear. No obvious pulmonary lesions. No pleural or pericardial effusion. Moderate-sized hiatal hernia.   Hepatobiliary: Hepatomegaly. The liver measures 19 x 18 x 15 cm. Geographic fatty infiltration of the liver. No focal hepatic lesions or intrahepatic biliary dilatation. I do not see any definite morphologic findings for cirrhosis. No early arterial phase enhancing lesions to suggest dysplastic nodules or HCC. The portal and hepatic veins are normal. The gallbladder is normal. No common bile duct dilatation.   Pancreas:  No mass, inflammation or ductal dilatation.   Spleen:  Normal size.  No focal lesions.   Adrenals/Urinary Tract: Stable right adrenal gland lesion which demonstrates significant signal loss on the out of phase gradient echo T1 weighted sequences consistent with benign adenoma. The left adrenal gland is normal. Both kidneys are normal. Stable small renal cysts.  No hydronephrosis.  Stomach/Bowel: The stomach, duodenum, visualized small bowel and visualize colon are unremarkable.   Vascular/Lymphatic: The aorta and branch vessels are normal. The major venous structures are patent. No mesenteric or retroperitoneal mass or adenopathy.   Other:  No ascites or abdominal wall hernia.   Musculoskeletal: No significant bony findings.   IMPRESSION: 1. Hepatomegaly. 2. Geographic fatty infiltration of the liver. 3. No worrisome hepatic lesions or definite morphologic findings of cirrhosis. No intra or extrahepatic biliary dilatation. 4. No acute abdominal findings, mass lesions or adenopathy. 5. Moderate to large hiatal hernia.     Electronically Signed   By: Marijo Sanes M.D.   On: 12/02/2015 09:55  ASSESSMENT/PLAN: 52 year old female with a past medical history of adenomatous colon polyps, GERD with large hiatal hernia, colonic diverticulosis, hypertension, hyperlipidemia, diabetes who is seen in consultation at the request of Dr. Buelah Manis to evaluate elevated liver enzymes.  1. Elevated LFTs/fatty liver/alcohol abuse/depression--her fatty liver is most likely secondary to alcohol use/abuse but she also has risk factors for nonalcoholic fatty liver disease (specifically diabetes, hypertension and hyperlipidemia).  We had a long discussion today regarding her alcohol use and abuse and clearly she is using this for her depression and loneliness.  She does have great insight as to this reasoning and is open to reducing her alcohol intake.  This will likely only be successful if she establishes a better outlet for her depression and insomnia.  Prozac was recently increased and likely not enough time to see the benefit of the higher dose.  I also encouraged her to contact her mental health therapist and reestablish regular follow-up.  She is open to this. --Recommended she reduce her alcohol gradually with a goal of no alcohol going forward.  This should  help her fatty liver disease and liver enzyme elevation. --No overt evidence of cirrhosis or portal hypertension --I am checking an ultrasound with elastography to evaluate level of fibrosis --I am also going to rule out autoimmune liver disease with additional lab testing  2.  Loose stools --a long-standing issue for her and likely somewhat related to her alcohol intake.  Reducing alcohol as above should help with this symptom.  I am adding Benefiber 1 tablespoon daily to try to help bulk the stools.  She will be due repeat colonoscopy later this year for polyp surveillance.  We can assess this condition more at follow-up  3.  History of adenomatous colon polyp --  surveillance colonoscopy recommended later this year  4.  GERD with hiatal hernia --on daily pantoprazole with no significant heartburn.  No dysphagia or odynophagia.  We will continue current dose of this medication.    AV:[id number], [place], [place] [id number]

## 2017-02-24 NOTE — Patient Instructions (Addendum)
Your physician has requested that you go to the basement for lab work before leaving today.  Please purchase the following medications over the counter and take as directed: Benefiber 1 tablespoon daily.  Please reduce and eventually discontinue all together alcohol.  Call as soon as possible to get back in with your therapist.  You have been scheduled for an abdominal ultrasound at [facility] (1st floor of hospital) on Monday 03/01/17 at 9:00 am. Please arrive 15 minutes prior to your appointment for registration. Make certain not to have anything to eat or drink after midnight on the night before your appointment. Should you need to reschedule your appointment, please contact radiology at [phone]. This test typically takes about 30 minutes to perform.  Please follow up with Dr Hilarie Fredrickson on Wednesday, 05/19/17 at 2:00 pm.  If you are age 73 or older, your body mass index should be between 23-30. Your Body mass index is 24.8 kg/m. If this is out of the aforementioned range listed, please consider follow up with your Primary Care Provider.  If you are age 7 or younger, your body mass index should be between 19-25. Your Body mass index is 24.8 kg/m. If this is out of the aformentioned range listed, please consider follow up with your Primary Care Provider.

## 2017-02-25 MED ORDER — METFORMIN HCL 850 MG PO TABS: 850.0000 mg | ORAL_TABLET | Freq: Two times a day (BID) | ORAL | 3 refills | Status: DC

## 2017-02-25 NOTE — Telephone Encounter (Signed)
Received PA request for Metformin ER OSM.  Per prior note from MD, medication changed to Metformin 850 mg BID Immediate release.

## 2017-02-25 NOTE — Addendum Note (Signed)
Addended by: Sheral Flow on: 02/25/2017 01:02 PM   Modules accepted: Orders

## 2017-02-27 LAB — ANA: Anti Nuclear Antibody(ANA): POSITIVE — AB

## 2017-02-27 LAB — HCV RNA,QUANTITATIVE REAL TIME PCR
HCV QUANT LOG: NOT DETECTED {Log_IU}/mL
HCV RNA, PCR, QN: <15 IU/mL

## 2017-02-27 LAB — CERULOPLASMIN: Ceruloplasmin: 28 mg/dL (ref 18–53)

## 2017-02-27 LAB — HEPATITIS C ANTIBODY
Hepatitis C Ab: REACTIVE — AB
SIGNAL TO CUT-OFF: 1.93 — ABNORMAL HIGH (ref <1.00)

## 2017-02-27 LAB — ANTI-NUCLEAR AB-TITER (ANA TITER): ANA Titer 1: 1:80 {titer} — ABNORMAL HIGH

## 2017-02-27 LAB — HEPATITIS B CORE ANTIBODY, TOTAL: HEP B C TOTAL AB: NONREACTIVE

## 2017-02-27 LAB — ANTI-SMOOTH MUSCLE ANTIBODY, IGG: Actin (Smooth Muscle) Antibody (IGG): <20 U (ref <20)

## 2017-02-27 LAB — HEPATITIS B SURFACE ANTIGEN: HEP B S AG: NONREACTIVE

## 2017-02-27 LAB — HEPATITIS A ANTIBODY, TOTAL: HEPATITIS A AB,TOTAL: BORDERLINE — AB

## 2017-02-27 LAB — HEPATITIS B SURFACE ANTIBODY,QUALITATIVE: Hep B S Ab: REACTIVE — AB

## 2017-02-27 LAB — MITOCHONDRIAL ANTIBODIES

## 2017-02-27 LAB — IGG: IGG (IMMUNOGLOBIN G), SERUM: 1476 mg/dL (ref 694–1618)

## 2017-03-01 ENCOUNTER — Ambulatory Visit (HOSPITAL_COMMUNITY)
Admission: RE | Admit: 2017-03-01 | Discharge: 2017-03-01 | Disposition: A | Discharge status: Home / Self Care | Payer: Commercial Managed Care - PPO | Source: Ambulatory Visit | Attending: Internal Medicine | Admitting: Internal Medicine

## 2017-03-01 DIAGNOSIS — Z789 Other specified health status: Secondary | ICD-10-CM | POA: Diagnosis present

## 2017-03-01 DIAGNOSIS — K760 Fatty (change of) liver, not elsewhere classified: Secondary | ICD-10-CM | POA: Diagnosis present

## 2017-03-01 DIAGNOSIS — D3501 Benign neoplasm of right adrenal gland: Secondary | ICD-10-CM | POA: Insufficient documentation

## 2017-03-01 DIAGNOSIS — N281 Cyst of kidney, acquired: Secondary | ICD-10-CM | POA: Insufficient documentation

## 2017-03-01 DIAGNOSIS — R945 Abnormal results of liver function studies: Secondary | ICD-10-CM | POA: Diagnosis not present

## 2017-03-01 DIAGNOSIS — R7989 Other specified abnormal findings of blood chemistry: Secondary | ICD-10-CM

## 2017-03-01 DIAGNOSIS — Z7289 Other problems related to lifestyle: Secondary | ICD-10-CM

## 2017-03-11 ENCOUNTER — Telehealth: Payer: Self-pay

## 2017-03-11 NOTE — Telephone Encounter (Signed)
Call was placed to patient to discuss lab results patient had done at [place]

## 2017-03-15 ENCOUNTER — Other Ambulatory Visit: Payer: Self-pay | Admitting: *Deleted

## 2017-03-15 MED ORDER — FLUOXETINE HCL 40 MG PO CAPS: ORAL_CAPSULE | ORAL | 3 refills | Status: DC

## 2017-03-19 NOTE — Telephone Encounter (Signed)
Lab results sent via my chart

## 2017-03-31 ENCOUNTER — Other Ambulatory Visit: Payer: Self-pay | Admitting: Family Medicine

## 2017-03-31 DIAGNOSIS — Z1231 Encounter for screening mammogram for malignant neoplasm of breast: Secondary | ICD-10-CM

## 2017-04-06 ENCOUNTER — Ambulatory Visit
Admission: RE | Admit: 2017-04-06 | Discharge: 2017-04-06 | Disposition: A | Discharge status: Home / Self Care | Payer: Commercial Managed Care - PPO | Source: Ambulatory Visit | Attending: Family Medicine | Admitting: Family Medicine

## 2017-04-06 ENCOUNTER — Encounter: Payer: Self-pay | Admitting: Endocrinology

## 2017-04-06 DIAGNOSIS — Z1231 Encounter for screening mammogram for malignant neoplasm of breast: Secondary | ICD-10-CM

## 2017-04-14 ENCOUNTER — Ambulatory Visit (INDEPENDENT_AMBULATORY_CARE_PROVIDER_SITE_OTHER): Payer: Commercial Managed Care - PPO

## 2017-04-14 ENCOUNTER — Ambulatory Visit (INDEPENDENT_AMBULATORY_CARE_PROVIDER_SITE_OTHER): Payer: Self-pay

## 2017-04-14 ENCOUNTER — Ambulatory Visit (INDEPENDENT_AMBULATORY_CARE_PROVIDER_SITE_OTHER): Payer: Commercial Managed Care - PPO | Admitting: Orthopaedic Surgery

## 2017-04-14 ENCOUNTER — Encounter (INDEPENDENT_AMBULATORY_CARE_PROVIDER_SITE_OTHER): Payer: Self-pay | Admitting: Orthopaedic Surgery

## 2017-04-14 VITALS — BP 164/104 | HR 83

## 2017-04-14 DIAGNOSIS — I10 Essential (primary) hypertension: Secondary | ICD-10-CM | POA: Diagnosis not present

## 2017-04-14 DIAGNOSIS — M25561 Pain in right knee: Secondary | ICD-10-CM

## 2017-04-14 DIAGNOSIS — G8929 Other chronic pain: Secondary | ICD-10-CM

## 2017-04-14 DIAGNOSIS — M25562 Pain in left knee: Secondary | ICD-10-CM

## 2017-04-14 LAB — URIC ACID: Uric Acid, Serum: 9.9 mg/dL — ABNORMAL HIGH (ref 2.5–7.0)

## 2017-04-14 NOTE — Progress Notes (Deleted)
   Procedure Note  Patient: Stacy [name]             Date of Birth: 1965-02-23           MRN: [id number]             Visit Date: 04/14/2017  Procedures: Visit Diagnoses: Chronic pain of both knees - Plan: XR KNEE 3 VIEW LEFT, XR KNEE 3 VIEW RIGHT, XR Knee 1-2 Views Left, XR Knee 1-2 Views Right  No procedures performed

## 2017-04-16 ENCOUNTER — Telehealth (INDEPENDENT_AMBULATORY_CARE_PROVIDER_SITE_OTHER): Payer: Self-pay | Admitting: Radiology

## 2017-04-16 ENCOUNTER — Ambulatory Visit: Payer: Commercial Managed Care - PPO | Admitting: Internal Medicine

## 2017-04-16 MED ORDER — ALLOPURINOL 100 MG PO TABS: ORAL_TABLET | ORAL | 0 refills | Status: DC

## 2017-04-16 NOTE — Telephone Encounter (Signed)
Dr. Lorin Mercy has been calling patient to discuss lab work. Verbal order to call in Allopurinol 100mg  1 po qd #30 to start on 04/28/2017 given. Rx entered and sent to patient's pharmacy.

## 2017-04-19 ENCOUNTER — Other Ambulatory Visit: Payer: Self-pay | Admitting: Family Medicine

## 2017-04-25 NOTE — Progress Notes (Signed)
Office Visit Note   Patient: Stacy [name]           Date of Birth: 18-Jul-1965           MRN: [id number] Visit Date: 04/14/2017              Requested by: Alycia Rossetti, MD [address] [place],  [id number] PCP: Alycia Rossetti, MD   Assessment & Plan: Visit Diagnoses:  1. Chronic pain of both knees   2. Essential hypertension     Plan: We will obtain a uric acid.  She likely is having some subclinical gout needs to take her Naprosyn regularly.  If uric acid is elevated she would be a good candidate to start on allopurinol.  Follow-Up Instructions: No follow-ups on file.   Orders:  Orders Placed This Encounter  Procedures  . XR KNEE 3 VIEW LEFT  . XR KNEE 3 VIEW RIGHT  . XR Knee 1-2 Views Left  . XR Knee 1-2 Views Right  . Uric acid   Meds ordered this encounter  Medications  . allopurinol (ZYLOPRIM) 100 MG tablet    Sig: Take one tablet by mouth daily.  Start on 04/28/2017.    Dispense:  30 tablet    Refill:  0      Procedures: No procedures performed   Clinical Data: No additional findings.   Subjective: Chief Complaint  Patient presents with  . Right Knee - Pain  . Left Knee - Pain    HPI 52 year old female seen with right greater than left knee pain.  Pain does not radiate she has some cracking soreness stiffness.  She does have a history of gout and has taken colchicine and also Naprosyn but not taking the Naprosyn regularly.  She has not had previous injections has not used a brace.  Her symptoms have been off and on for 2 years is gradually getting worse.  While working she does some sitting and standing.  Pain bothers her worse when she gets home.  Blood pressure was elevated today which was discussed 164/104.  Review of Systems positive for depression related to loss of her spouse with unexpected sudden death.  History of gout colonic polyps, type 2 diabetes, alcohol dependence.  Hyperlipidemia hypertension.  Otherwise negative as it  pertains to HPI.   Objective: Vital Signs: BP (!) 164/104   Pulse 83   Physical Exam  Constitutional: She is oriented to person, place, and time. She appears well-developed.  HENT:  Head: Normocephalic.  Right Ear: External ear normal.  Left Ear: External ear normal.  Eyes: Pupils are equal, round, and reactive to light.  Neck: No tracheal deviation present. No thyromegaly present.  Cardiovascular: Normal rate.  Pulmonary/Chest: Effort normal.  Abdominal: Soft.  Neurological: She is alert and oriented to person, place, and time.  Skin: Skin is warm and dry.  Psychiatric: She has a normal mood and affect. Her behavior is normal.    Ortho Exam patient has intact distal pulses.  No pain with hip range of motion.  Slight knee effusion noted right and left with some tenderness medial lateral joint line.  ACL PCL exam is normal.  No erythema of the knees.  Good quad strength.  Comfort with patellofemoral loading.  Specialty Comments:  No specialty comments available.  Imaging: Xr Knee 3 View Left  Result Date: 04/25/2017 Standing AP both knees and lateral left knee obtained and reviewed.  Acute changes.  Trace effusion noted.  Impression: Negative for acute changes.  Xr Knee 3 View Right  Result Date: 04/25/2017 Standing AP both knees lateral right knee obtained and reviewed.  This shows trace effusion negative for acute changes.  Minimal joint narrowing. Impression: Negative for acute changes.  Slight knee effusion noted.    PMFS History: Patient Active Problem List   Diagnosis Date Noted  . Insomnia 01/01/2015  . Alcoholic hepatitis 62/83/1517  . Fatty liver 01/01/2015  . Adrenal mass, right Dr Erskine Squibb 03/13/2014  . Thyroid nodule  managed Dr. Posey Pronto 02/27/2014  . EtOH dependence ([place]) 11/22/2013  . Bereavement 10/04/2013  . Tobacco use 10/04/2013  . Situational depression 08/23/2013  . Colon polyp 08/23/2013  . Migraines 08/23/2013  . GERD (gastroesophageal  reflux disease)   . Depression with anxiety   . Diabetes mellitus, type II, insulin dependent ([place])   . Gout   . Hypertension   . Hyperlipemia    Past Medical History:  Diagnosis Date  . Adrenal adenoma    as per MRI 03/09/14  . Anxiety   . Diabetes mellitus   . Diverticulosis   . Elevated LFTs    elevated since 09/2013  . Fatty liver    moderate- as per 03/06/17 MRI liver  . GERD (gastroesophageal reflux disease)   . Gout   . H/O hiatal hernia   . Headache(784.0)   . History of depression   . Hyperlipemia   . Hyperplastic colon polyp   . Hypertension   . Positive hepatitis C antibody test    RNA NEGATIVE  . Thyroid nodule  managed Dr. Posey Pronto 02/27/2014  . Tobacco abuse   . Tubular adenoma of colon     Family History  Problem Relation Age of Onset  . Coronary artery disease Father 69  . Hypertension Mother   . Diabetes Sister   . Cancer Sister 54       uterine cancer  . Breast cancer Neg Hx     Past Surgical History:  Procedure Laterality Date  . ANAL FISTULECTOMY  01/19/1985  . BREAST EXCISIONAL BIOPSY Left   . BREAST LUMPECTOMY Left 1988  . PAROTIDECTOMY  03/30/2011   Procedure: PAROTIDECTOMY;  Surgeon: Izora Gala, MD;  Location: [facility];  Service: ENT;  Laterality: Right;  . WRIST GANGLION EXCISION Left 1988   Social History   Occupational History    Employer: [employer]  Tobacco Use  . Smoking status: Current Every Day Smoker    Packs/day: 1.00    Years: 28.00    Pack years: 28.00    Types: Cigarettes  . Smokeless tobacco: Never Used  Substance and Sexual Activity  . Alcohol use: Yes    Alcohol/week: 7.0 oz    Types: 14 Standard drinks or equivalent per week  . Drug use: No  . Sexual activity: Not Currently    Birth control/protection: Post-menopausal

## 2017-04-26 ENCOUNTER — Encounter: Payer: Self-pay | Admitting: Family Medicine

## 2017-04-26 ENCOUNTER — Ambulatory Visit (INDEPENDENT_AMBULATORY_CARE_PROVIDER_SITE_OTHER): Payer: Commercial Managed Care - PPO | Admitting: Family Medicine

## 2017-04-26 ENCOUNTER — Other Ambulatory Visit: Payer: Self-pay

## 2017-04-26 VITALS — BP 138/72 | HR 90 | Temp 98.5°F | Resp 16 | Ht 63.0 in | Wt 144.0 lb

## 2017-04-26 DIAGNOSIS — I10 Essential (primary) hypertension: Secondary | ICD-10-CM

## 2017-04-26 DIAGNOSIS — Z720 Tobacco use: Secondary | ICD-10-CM

## 2017-04-26 DIAGNOSIS — L700 Acne vulgaris: Secondary | ICD-10-CM

## 2017-04-26 DIAGNOSIS — E119 Type 2 diabetes mellitus without complications: Secondary | ICD-10-CM | POA: Diagnosis not present

## 2017-04-26 DIAGNOSIS — K760 Fatty (change of) liver, not elsewhere classified: Secondary | ICD-10-CM

## 2017-04-26 DIAGNOSIS — E7800 Pure hypercholesterolemia, unspecified: Secondary | ICD-10-CM

## 2017-04-26 DIAGNOSIS — Z794 Long term (current) use of insulin: Secondary | ICD-10-CM

## 2017-04-26 DIAGNOSIS — F418 Other specified anxiety disorders: Secondary | ICD-10-CM | POA: Diagnosis not present

## 2017-04-26 MED ORDER — BUPROPION HCL 100 MG PO TABS: 100.0000 mg | ORAL_TABLET | Freq: Two times a day (BID) | ORAL | 3 refills | Status: DC

## 2017-04-26 MED ORDER — MUPIROCIN CALCIUM 2 % EX CREA: 1.0000 "application " | TOPICAL_CREAM | Freq: Two times a day (BID) | CUTANEOUS | 0 refills | Status: DC

## 2017-04-26 NOTE — Patient Instructions (Addendum)
Wellbutrin twice a day for smoking and mood Continue current medication Use antibiotic oiintment F/U 3 months

## 2017-04-26 NOTE — Progress Notes (Signed)
   Subjective:    Patient ID: Stacy [name], female    DOB: 21-Jul-1965, 52 y.o.   MRN: [id number]  Patient presents for Follow-up (is not fasting)  Pt here to f/u chronic medical problems DM- had labs done at [facility] last A1C 7.2% 3 weeks ago, taking Metformin and Tresiba 24 units, has not started jardiance yet CBG are good per report   Alcohol abuse- she is contemplating rehab, has been [facility] for information  FLD- mixed alcoholic and Hyperlipidemia/DM- followed by GI , has appt next month   Has nodules that pop on her ears, draining at times, left one is very uncomfortable, has had for years  Seen by Orthopedics- give allopurinol, uric acid was high with bilat knee pain, has not stated yet  HTN- taking valsartan HCTZ  Depression with anxiety- taking prozac but does not feel much different on the 40mg , does not sleep well, often wakes up, also smoking more since her mother became sick back in Jan, up to 1ppd now.   Review Of Systems:  GEN- denies fatigue, fever, weight loss,weakness, recent illness HEENT- denies eye drainage, change in vision, nasal discharge, CVS- denies chest pain, palpitations RESP- denies SOB, cough, wheeze ABD- denies N/V, change in stools, abd pain GU- denies dysuria, hematuria, dribbling, incontinence MSK- denies joint pain, muscle aches, injury Neuro- denies headache, dizziness, syncope, seizure activity       Objective:    BP 138/72   Pulse 90   Temp 98.5 F (36.9 C) (Oral)   Resp 16   Ht 5\' 3"  (1.6 m)   Wt 144 lb (65.3 kg)   SpO2 97%   BMI 25.51 kg/m  GEN- NAD, alert and oriented x3 HEENT- PERRL, EOMI, non injected sclera, pink conjunctiva, MMM, oropharynx clear Neck- Supple, no thyromegaly CVS- RRR, no murmur RESP-CTAB ABD-NABS,soft,NT,ND Psych- normal affect and mood  Skin- bilat ears on pinna Musick heads, left open with scab, mild erythema, TTP EXT- No edema Pulses- Radial, DP- 2+        Assessment & Plan:       Problem List Items Addressed This Visit      Unprioritized   Fatty liver   Tobacco use    Add wellbutrin       Hypertension    Controlled no changes       Hyperlipemia    TG still elevated, but LDL has improved Has FLD/ mixed with alcoholic hepatitis changes       Diabetes mellitus, type II, insulin dependent (HCC) - Primary    A1C has improved, no changes to [place] , metformin Kidney function is stable      Depression with anxiety    Add wellbutrin to her current prozac  Discussed rehab, I think this was beneficial for her       Relevant Medications   buPROPion (WELLBUTRIN) 100 MG tablet    Other Visit Diagnoses    [place]       clean, apply antibacterial to open one in ear mild erythema on pinna       Note: This dictation was prepared with Dragon dictation along with smaller phrase technology. Any transcriptional errors that result from this process are unintentional.

## 2017-04-27 ENCOUNTER — Encounter: Payer: Self-pay | Admitting: Family Medicine

## 2017-04-27 NOTE — Assessment & Plan Note (Signed)
Controlled no changes 

## 2017-04-27 NOTE — Assessment & Plan Note (Signed)
TG still elevated, but LDL has improved Has FLD/ mixed with alcoholic hepatitis changes

## 2017-04-27 NOTE — Assessment & Plan Note (Signed)
A1C has improved, no changes to [place] , metformin Kidney function is stable

## 2017-04-27 NOTE — Assessment & Plan Note (Signed)
Add wellbutrin to her current prozac  Discussed rehab, I think this was beneficial for her

## 2017-04-27 NOTE — Assessment & Plan Note (Signed)
Add wellbutrin

## 2017-05-19 ENCOUNTER — Other Ambulatory Visit: Payer: Self-pay | Admitting: Family Medicine

## 2017-05-19 ENCOUNTER — Other Ambulatory Visit (INDEPENDENT_AMBULATORY_CARE_PROVIDER_SITE_OTHER): Payer: Commercial Managed Care - PPO

## 2017-05-19 ENCOUNTER — Other Ambulatory Visit (INDEPENDENT_AMBULATORY_CARE_PROVIDER_SITE_OTHER): Payer: Self-pay | Admitting: Orthopaedic Surgery

## 2017-05-19 ENCOUNTER — Ambulatory Visit (INDEPENDENT_AMBULATORY_CARE_PROVIDER_SITE_OTHER): Payer: Commercial Managed Care - PPO | Admitting: Internal Medicine

## 2017-05-19 ENCOUNTER — Encounter: Payer: Self-pay | Admitting: Internal Medicine

## 2017-05-19 VITALS — BP 124/76 | HR 80 | Ht 63.0 in | Wt 142.0 lb

## 2017-05-19 DIAGNOSIS — K760 Fatty (change of) liver, not elsewhere classified: Secondary | ICD-10-CM

## 2017-05-19 DIAGNOSIS — G4700 Insomnia, unspecified: Secondary | ICD-10-CM | POA: Diagnosis not present

## 2017-05-19 DIAGNOSIS — K219 Gastro-esophageal reflux disease without esophagitis: Secondary | ICD-10-CM | POA: Diagnosis not present

## 2017-05-19 DIAGNOSIS — Z86010 Personal history of colonic polyps: Secondary | ICD-10-CM

## 2017-05-19 DIAGNOSIS — F1010 Alcohol abuse, uncomplicated: Secondary | ICD-10-CM

## 2017-05-19 DIAGNOSIS — R7989 Other specified abnormal findings of blood chemistry: Secondary | ICD-10-CM

## 2017-05-19 DIAGNOSIS — R600 Localized edema: Secondary | ICD-10-CM | POA: Diagnosis not present

## 2017-05-19 DIAGNOSIS — R945 Abnormal results of liver function studies: Secondary | ICD-10-CM

## 2017-05-19 LAB — CBC WITH DIFFERENTIAL/PLATELET
BASOS ABS: 0.1 10*3/uL (ref 0.0–0.1)
BASOS PCT: 0.8 % (ref 0.0–3.0)
EOS ABS: 0.2 10*3/uL (ref 0.0–0.7)
Eosinophils Relative: 2.1 % (ref 0.0–5.0)
HCT: 38.8 % (ref 36.0–46.0)
HEMOGLOBIN: 13.3 g/dL (ref 12.0–15.0)
Lymphocytes Relative: 37.1 % (ref 12.0–46.0)
Lymphs Abs: 3 10*3/uL (ref 0.7–4.0)
MCHC: 34.4 g/dL (ref 30.0–36.0)
MCV: 88.5 fl (ref 78.0–100.0)
MONO ABS: 0.5 10*3/uL (ref 0.1–1.0)
Monocytes Relative: 5.6 % (ref 3.0–12.0)
Neutro Abs: 4.4 10*3/uL (ref 1.4–7.7)
Neutrophils Relative %: 54.4 % (ref 43.0–77.0)
Platelets: 347 10*3/uL (ref 150.0–400.0)
RBC: 4.39 Mil/uL (ref 3.87–5.11)
RDW: 13.8 % (ref 11.5–15.5)
WBC: 8.1 10*3/uL (ref 4.0–10.5)

## 2017-05-19 LAB — COMPREHENSIVE METABOLIC PANEL
ALBUMIN: 3.8 g/dL (ref 3.5–5.2)
ALT: 26 U/L (ref 0–35)
AST: 21 U/L (ref 0–37)
Alkaline Phosphatase: 89 U/L (ref 39–117)
BILIRUBIN TOTAL: 0.2 mg/dL (ref 0.2–1.2)
BUN: 14 mg/dL (ref 6–23)
CO2: 31 mEq/L (ref 19–32)
CREATININE: 0.76 mg/dL (ref 0.40–1.20)
Calcium: 9.5 mg/dL (ref 8.4–10.5)
Chloride: 99 mEq/L (ref 96–112)
GFR: 102.51 mL/min (ref >60.00)
Glucose, Bld: 167 mg/dL — ABNORMAL HIGH (ref 70–99)
Potassium: 3.2 mEq/L — ABNORMAL LOW (ref 3.5–5.1)
SODIUM: 139 meq/L (ref 135–145)
TOTAL PROTEIN: 7.3 g/dL (ref 6.0–8.3)

## 2017-05-19 LAB — PROTIME-INR
INR: 1 ratio (ref 0.8–1.0)
Prothrombin Time: 12 s (ref 9.6–13.1)

## 2017-05-19 MED ORDER — ZOLPIDEM TARTRATE 10 MG PO TABS: 10.0000 mg | ORAL_TABLET | Freq: Every evening | ORAL | 0 refills | Status: DC | PRN

## 2017-05-19 MED ORDER — FUROSEMIDE 20 MG PO TABS: 20.0000 mg | ORAL_TABLET | Freq: Every day | ORAL | 0 refills | Status: DC

## 2017-05-19 NOTE — Telephone Encounter (Signed)
Ok for refill? 

## 2017-05-19 NOTE — Patient Instructions (Signed)
Your provider has requested that you go to the basement level for lab work before leaving today. Press "B" on the elevator. The lab is located at the first door on the left as you exit the elevator.  We have sent the following medications to your pharmacy for you to pick up at your convenience:  Ambien, Lasix  Take the Lasix for a few days and if you are on it for longer than a week, come back by the lab to have some blood drawn.  Continue to take your PPI and benefiber.  Please follow up in 3 months

## 2017-05-20 ENCOUNTER — Other Ambulatory Visit: Payer: Self-pay

## 2017-05-20 ENCOUNTER — Encounter: Payer: Self-pay | Admitting: Internal Medicine

## 2017-05-20 MED ORDER — POTASSIUM CHLORIDE CRYS ER 20 MEQ PO TBCR: 40.0000 meq | EXTENDED_RELEASE_TABLET | Freq: Every day | ORAL | 0 refills | Status: DC

## 2017-05-20 NOTE — Progress Notes (Signed)
Subjective:    Patient ID: Stacy [name], female    DOB: [birth year], 52 y.o.   MRN: [id number]  HPI Stacy [name] is a 52 year old female with a history of adenomatous polyps, GERD with large hiatal hernia, colonic diverticulosis, elevated liver enzymes with alcohol abuse, hypertension and hyperlipidemia, diabetes who is here for follow-up.  She was last seen on 02/24/2017 to evaluate elevated liver enzymes.  At that time she admitted to drinking alcohol heavily.  We did an ultrasound with elastography which showed fatty liver but also F2 plus F3 fibrosis.  We recommended that she reduce her alcohol intake and follow-up which she is doing today.  She reports that she is cut back on her alcohol significantly.  She is now drinking on average 1 drink every 2 days.  She actually stopped by [facility] to ask them about alcohol rehabilitation.  It sounds like they require a 30-day inpatient stay before qualifying for outpatient treatment.  They did give her a list of AA meetings locally but she has not gone to a meeting yet.    Physically she is feeling well but she is having trouble with lower extremity feet and ankle edema late in the day.  Edema is gone by morning.  She is also having issues with insomnia.  This is been an ongoing issue for her through the years.  She has tried melatonin with no relief. Benadryl actually seems to be stimulating to her rather than sedating.. She used Ambien in the past with success but over 10 years ago.  She is using Benefiber which is been very helpful for her loose stools.  She is now having stool once per day which can still be loose but no diarrhea.  No blood in her stool or melena.  No jaundice or itching.  No increasing abdominal girth or swelling.  Review of Systems As per HPI, otherwise negative  Current Medications, Allergies, Past Medical History, Past Surgical History, Family History and Social History were reviewed in [employer] record.     Objective:   Physical Exam BP 124/76   Pulse 80   Ht 5\' 3"  (1.6 m)   Wt 142 lb (64.4 kg)   BMI 25.15 kg/m  Constitutional: Well-developed and well-nourished. No distress. HEENT: Normocephalic and atraumatic. Oropharynx is clear and moist. Conjunctivae are normal.  No scleral icterus. Neck: Neck supple. Trachea midline. Cardiovascular: Normal rate, regular rhythm and intact distal pulses. No M/R/G Pulmonary/chest: Effort normal and breath sounds normal. No wheezing, rales or rhonchi. Abdominal: Soft, nontender, nondistended. Bowel sounds active throughout. There are no masses palpable. No hepatosplenomegaly. Extremities: no clubbing, cyanosis, 1+ pitting edema top of b/l feet and on lower shins b/l Neurological: Alert and oriented to person place and time.  No asterixis Skin: Skin is warm and dry. Psychiatric: Normal mood and affect. Behavior is normal.  ULTRASOUND ABDOMEN   ULTRASOUND HEPATIC ELASTOGRAPHY   TECHNIQUE: Sonography of the upper abdomen was performed. In addition, ultrasound elastography evaluation of the liver was performed. A region of interest was placed within the right lobe of the liver. Following application of a compressive sonographic pulse, shear waves were detected in the adjacent hepatic tissue and the shear wave velocity was calculated. Multiple assessments were performed at the selected site. Median shear wave velocity is correlated to a Metavir fibrosis score.   COMPARISON:  12/02/2015   FINDINGS: ULTRASOUND ABDOMEN   Gallbladder: No gallstones or wall thickening visualized. No sonographic Murphy sign noted  by sonographer.   Common bile duct: Diameter: 3 mm   Liver: No focal lesion identified. Coarse echogenic liver with poor sonic penetration compatible with diffuse hepatic steatosis. Portal vein is patent on color Doppler imaging with normal direction of blood flow towards the liver.   IVC: No abnormality  visualized.   Pancreas: Visualized portion unremarkable.   Spleen: Size and appearance within normal limits.   Right Kidney: Length: 11.2 cm. Echogenicity within normal limits. No mass or hydronephrosis visualized. 1.0 cm cyst observed in the right mid kidney, as shown on prior MRI.   Left Kidney: Length: 11.2 cm. Echogenicity within normal limits. No mass or hydronephrosis visualized.   Abdominal aorta: No aneurysm visualized.   Other findings: Right adrenal mass 3.5 by 2.3 by 3.2 cm, previously demonstrating significant signal dropout on out of phase images on the prior MRI indicating adrenal adenoma. On prior MRI this measured 3.5 by 2.6 by 3.3 cm.   ULTRASOUND HEPATIC ELASTOGRAPHY   Device: Siemens Helix VTQ   Patient position: Left lateral decubitus   Transducer 6C1   Number of measurements: 10   Hepatic segment:  8   Median velocity:   1.66 m/sec   IQR: 0.10   IQR/Median velocity ratio: 0.06   Corresponding Metavir fibrosis score:  F2 + some F3   Risk of fibrosis: Moderate   Limitations of exam: None   Please note that abnormal shear wave velocities may also be identified in clinical settings other than with hepatic fibrosis, such as: acute hepatitis, elevated right heart and central venous pressures including use of beta blockers, veno-occlusive disease (Budd-Chiari), infiltrative processes such as mastocytosis/amyloidosis/infiltrative tumor, extrahepatic cholestasis, in the post-prandial state, and liver transplantation. Correlation with patient history, laboratory data, and clinical condition recommended.   IMPRESSION: ULTRASOUND ABDOMEN:   1. Stable right adrenal adenoma.  Stable right mid kidney cyst. 2. Coarse echogenic liver with poor sonic penetration compatible with diffuse hepatic steatosis.   ULTRASOUND HEPATIC ELASTOGRAPHY:   Median hepatic shear wave velocity is calculated at 1.66 m/sec.   Corresponding Metavir fibrosis score is F2 +  some F3.   Risk of fibrosis is moderate.   Follow-up: Additional testing appropriate.     Electronically Signed   By: Van Clines M.D.   On: 03/01/2017 10:44      Assessment & Plan:  52 year old female with a history of adenomatous polyps, GERD with large hiatal hernia, colonic diverticulosis, elevated liver enzymes with alcohol abuse, hypertension and hyperlipidemia, diabetes who is here for follow-up.  1. Elevated LFTs, fatty liver, ETOH abuse --we had a long discussion today regarding her alcohol abuse and I congratulated her on being able to reduce her alcohol intake significantly.  She is also taken the initiative to seek out alcohol abuse counseling.  She plans to attend an [facility] meeting soon.  We discussed the results of her ultrasound and elastography.  She does have some mild to moderate liver fibrosis which will progress if she continues to drink.   I encouraged her to stop alcohol entirely and she will work to this end.  She may have some element of metabolic fatty liver disease but until she is off alcohol we cannot know how much alcohol is contributing to her fatty liver disease.  I am going to repeat CBC, CMP and INR today.  2.  Lower extremity edema --new.  Lasix 20 mg daily for 3 days, may need longer but needs to address this with primary care.  Low-sodium diet recommended.  I do not think her lower extremity edema is related to liver disease or portal hypertension.  3.  Loose stools --improved with Benefiber and likely alcohol reduction.  4.  History of colon polyps --due surveillance colonoscopy later this year  5.  GERD --well-controlled on pantoprazole.  Continue current therapy  6.  Insomnia --we will try her on Ambien 10 mg nightly as needed.  She is advised to discuss this issue with primary care for longer-term management.

## 2017-05-28 ENCOUNTER — Ambulatory Visit (INDEPENDENT_AMBULATORY_CARE_PROVIDER_SITE_OTHER): Payer: Commercial Managed Care - PPO | Admitting: Orthopaedic Surgery

## 2017-05-28 ENCOUNTER — Encounter (INDEPENDENT_AMBULATORY_CARE_PROVIDER_SITE_OTHER): Payer: Self-pay | Admitting: Orthopaedic Surgery

## 2017-05-28 VITALS — BP 139/95 | HR 98 | Ht 63.0 in | Wt 142.0 lb

## 2017-05-28 DIAGNOSIS — M10062 Idiopathic gout, left knee: Secondary | ICD-10-CM | POA: Diagnosis not present

## 2017-05-28 MED ORDER — LIDOCAINE HCL 1 % IJ SOLN
0.5000 mL | INTRAMUSCULAR | Status: AC | PRN
  Administered 2017-05-28: .5 mL

## 2017-05-28 MED ORDER — COLCHICINE 0.6 MG PO TABS: 0.6000 mg | ORAL_TABLET | Freq: Every day | ORAL | 3 refills | Status: DC

## 2017-05-28 MED ORDER — NAPROXEN 500 MG PO TABS: 500.0000 mg | ORAL_TABLET | Freq: Two times a day (BID) | ORAL | 3 refills | Status: DC

## 2017-05-28 NOTE — Progress Notes (Signed)
Office Visit Note   Patient: Stacy [name]           Date of Birth: [birth year]           MRN: [id number] Visit Date: 05/28/2017              Requested by: Lanice Shirts, MD [address] [place], [place] [address] PCP: Lanice Shirts, MD   Assessment & Plan: Visit Diagnoses:  1. Idiopathic gout of left knee, unspecified chronicity     Plan: Knee aspiration yellow slightly cloudy consistent with gout history.  Naprosyn 500 mg p.o. twice daily colchicine 1 to 2 days.  Continue allopurinol 100 mg daily for 1 to 2 weeks then increase to 200 mg daily.  Recheck 4 weeks.  Follow-Up Instructions: No follow-ups on file.   Orders:  No orders of the defined types were placed in this encounter.  Meds ordered this encounter  Medications  . naproxen (NAPROSYN) 500 MG tablet    Sig: Take 1 tablet (500 mg total) by mouth 2 (two) times daily with a meal.    Dispense:  60 tablet    Refill:  3  . colchicine 0.6 MG tablet    Sig: Take 1 tablet (0.6 mg total) by mouth daily.    Dispense:  30 tablet    Refill:  3      Procedures: Large Joint Inj: L knee on 05/28/2017 2:53 PM Indications: joint swelling and pain Details: 22 G 1.5 in needle, superolateral approach  Arthrogram: No  Medications: 0.5 mL lidocaine 1 % Aspirate: 35 mL cloudy Outcome: tolerated well, no immediate complications Procedure, treatment alternatives, risks and benefits explained, specific risks discussed. Consent was given by the patient. Immediately prior to procedure a time out was called to verify the correct patient, procedure, equipment, support staff and site/side marked as required. Patient was prepped and draped in the usual sterile fashion.       Clinical Data: No additional findings.   Subjective: Chief Complaint  Patient presents with  . Left Knee - Pain, Edema    HPI 52 year old female returns with acute swelling left knee history of gout last uric acid 9.9 with  significant pain swelling stiffness difficulty walking.  She requested knee aspiration.  40 cc yellow cloudy fluid aspirated.  She has diabetes and cortisone tends to make her sugars be difficult to control.  She is currently on allopurinol 100 mg daily but was only taking 1 500 mg Naprosyn daily instead of twice daily.  She has some colchicine she just started taking and requested a refill of both Naprosyn and colchicine.  Review of Systems reviewed updated and unchanged.  Positive for diabetes depression hyperlipidemia    Objective: Vital Signs: BP (!) 139/95 (BP Location: Right Arm, Patient Position: Sitting, Cuff Size: Normal)   Pulse 98   Ht 5\' 3"  (1.6 m)   Wt 142 lb (64.4 kg)   BMI 25.15 kg/m   Physical Exam  Constitutional: She is oriented to person, place, and time. She appears well-developed.  HENT:  Head: Normocephalic.  Right Ear: External ear normal.  Left Ear: External ear normal.  Eyes: Pupils are equal, round, and reactive to light.  Neck: No tracheal deviation present. No thyromegaly present.  Cardiovascular: Normal rate.  Pulmonary/Chest: Effort normal.  Abdominal: Soft.  Neurological: She is alert and oriented to person, place, and time.  Skin: Skin is warm and dry.  Psychiatric: She has a normal mood and  affect. Her behavior is normal.    Ortho Exam  4 plus effusion left knee.  No other joint synovitis ankles knees elbows wrists fingers.  Specialty Comments:  No specialty comments available.  Imaging: No results found.   PMFS History: Patient Active Problem List   Diagnosis Date Noted  . Insomnia 01/01/2015  . Fatty liver 01/01/2015  . Adrenal mass, right Dr Erskine Squibb 03/13/2014  . Thyroid nodule  managed Dr. Posey Pronto 02/27/2014  . EtOH dependence ([place]) 11/22/2013  . Bereavement 10/04/2013  . Tobacco use 10/04/2013  . Situational depression 08/23/2013  . Colon polyp 08/23/2013  . Migraines 08/23/2013  . GERD (gastroesophageal reflux disease)    . Depression with anxiety   . Diabetes mellitus, type II, insulin dependent ([place])   . Gout   . Hypertension   . Hyperlipemia    Past Medical History:  Diagnosis Date  . Adrenal adenoma    as per MRI 03/09/14  . Anxiety   . Diabetes mellitus   . Diverticulosis   . Elevated LFTs    elevated since 09/2013  . Fatty liver    moderate- as per 03/06/17 MRI liver  . GERD (gastroesophageal reflux disease)   . Gout   . H/O hiatal hernia   . Headache(784.0)   . History of depression   . Hyperlipemia   . Hyperplastic colon polyp   . Hypertension   . Positive hepatitis C antibody test    RNA NEGATIVE  . Thyroid nodule  managed Dr. Posey Pronto 02/27/2014  . Tobacco abuse   . Tubular adenoma of colon     Family History  Problem Relation Age of Onset  . Coronary artery disease Father 30  . Hypertension Mother   . Diabetes Sister   . Cancer Sister 27       uterine cancer  . Breast cancer Neg Hx     Past Surgical History:  Procedure Laterality Date  . ANAL FISTULECTOMY  01/19/1985  . BREAST EXCISIONAL BIOPSY Left   . BREAST LUMPECTOMY Left 1988  . PAROTIDECTOMY  03/30/2011   Procedure: PAROTIDECTOMY;  Surgeon: Izora Gala, MD;  Location: [facility];  Service: ENT;  Laterality: Right;  . WRIST GANGLION EXCISION Left 1988   Social History   Occupational History    Employer: [employer]  Tobacco Use  . Smoking status: Current Every Day Smoker    Packs/day: 1.00    Years: 28.00    Pack years: 28.00    Types: Cigarettes  . Smokeless tobacco: Never Used  Substance and Sexual Activity  . Alcohol use: Yes    Alcohol/week: 7.0 oz    Types: 14 Standard drinks or equivalent per week  . Drug use: No  . Sexual activity: Not Currently    Birth control/protection: Post-menopausal

## 2017-06-03 ENCOUNTER — Other Ambulatory Visit: Payer: Self-pay | Admitting: Internal Medicine

## 2017-06-04 ENCOUNTER — Other Ambulatory Visit: Payer: Self-pay | Admitting: Internal Medicine

## 2017-06-04 ENCOUNTER — Ambulatory Visit: Payer: Commercial Managed Care - PPO | Admitting: Endocrinology

## 2017-06-10 ENCOUNTER — Other Ambulatory Visit: Payer: Self-pay | Admitting: Internal Medicine

## 2017-06-29 ENCOUNTER — Ambulatory Visit (INDEPENDENT_AMBULATORY_CARE_PROVIDER_SITE_OTHER): Payer: Commercial Managed Care - PPO | Admitting: Orthopaedic Surgery

## 2017-07-06 ENCOUNTER — Encounter: Payer: Self-pay | Admitting: Family Medicine

## 2017-07-06 DIAGNOSIS — R772 Abnormality of alphafetoprotein: Secondary | ICD-10-CM

## 2017-07-06 DIAGNOSIS — K760 Fatty (change of) liver, not elsewhere classified: Secondary | ICD-10-CM

## 2017-07-06 MED ORDER — ZOLPIDEM TARTRATE 10 MG PO TABS: 10.0000 mg | ORAL_TABLET | Freq: Every evening | ORAL | 2 refills | Status: DC | PRN

## 2017-07-06 MED ORDER — ATORVASTATIN CALCIUM 20 MG PO TABS: 20.0000 mg | ORAL_TABLET | Freq: Every day | ORAL | 3 refills | Status: DC

## 2017-07-06 MED ORDER — VALSARTAN-HYDROCHLOROTHIAZIDE 320-25 MG PO TABS: 1.0000 | ORAL_TABLET | Freq: Every day | ORAL | 0 refills | Status: DC

## 2017-07-06 MED ORDER — PANTOPRAZOLE SODIUM 40 MG PO TBEC: DELAYED_RELEASE_TABLET | ORAL | 1 refills | Status: DC

## 2017-07-06 MED ORDER — INSULIN DEGLUDEC 100 UNIT/ML ~~LOC~~ SOPN
24.0000 [IU] | PEN_INJECTOR | Freq: Every day | SUBCUTANEOUS | 2 refills | Status: DC
Start: 2017-07-06 — End: 2017-09-06

## 2017-07-06 MED ORDER — FLUOXETINE HCL 40 MG PO CAPS: ORAL_CAPSULE | ORAL | 3 refills | Status: DC

## 2017-07-06 MED ORDER — EMPAGLIFLOZIN 25 MG PO TABS: 25.0000 mg | ORAL_TABLET | Freq: Every day | ORAL | 3 refills | Status: DC

## 2017-07-06 NOTE — Telephone Encounter (Signed)
Ok to refill??  Last office visit 04/26/2017.  Last refill 05/19/2017 from Dr. Hilarie Fredrickson.

## 2017-07-09 ENCOUNTER — Encounter: Payer: Self-pay | Admitting: Family Medicine

## 2017-07-13 ENCOUNTER — Encounter: Payer: Self-pay | Admitting: Family Medicine

## 2017-07-13 NOTE — Telephone Encounter (Signed)
Call placed to patient. States that she woke up this morning and felt "awful." Reports that fasting BS noted at 491 this AM. She then took her morning medications and is pushing fluids. States that she has been taking Jardiance 25mg  and Tresiba 24U.   Reports that she was confused about medication changes due to insurance issues. Patient has not been taking Metformin 850mg  BID as directed since Metformin 1000mg  was not covered. Re-educated on medications.   Patient is to re-check her blood sugar and call with results.

## 2017-07-13 NOTE — Telephone Encounter (Signed)
We may need to get her meter checked because none of her meds should drop her sugar that fast, so I suspect she is near goal for her morning sugars.  Can she check her meter at the pharmacy - or do a side by side with her meter and the meter at work, and lmk if we need to get her a new meter Rx'd.  Or if her sugars truly are elevated (fasting blood sugar routinely >180 in the morning) then we need to titrate up her basal insuline dose.

## 2017-07-13 NOTE — Telephone Encounter (Signed)
Patient re-checked her FSBS and noted the results to be 103.  MD please advise.

## 2017-07-15 MED ORDER — METFORMIN HCL 850 MG PO TABS: 850.0000 mg | ORAL_TABLET | Freq: Two times a day (BID) | ORAL | 3 refills | Status: DC

## 2017-08-02 ENCOUNTER — Telehealth: Payer: Self-pay | Admitting: *Deleted

## 2017-08-02 NOTE — Telephone Encounter (Signed)
[facility] is reviewing your PA request. Typically an electronic response will be received within 72 hours.

## 2017-08-02 NOTE — Telephone Encounter (Signed)
Received request from pharmacy for [place] on Tresiba.  PA submitted.   Dx: E11.65- DM.

## 2017-08-03 ENCOUNTER — Other Ambulatory Visit: Payer: Self-pay | Admitting: *Deleted

## 2017-08-03 ENCOUNTER — Encounter: Payer: Self-pay | Admitting: *Deleted

## 2017-08-03 MED ORDER — FLUOXETINE HCL 40 MG PO CAPS: ORAL_CAPSULE | ORAL | 3 refills | Status: DC

## 2017-08-03 NOTE — Telephone Encounter (Signed)
Patient aware per MyChart.   

## 2017-08-03 NOTE — Telephone Encounter (Signed)
Received PA determination: Request Reference Number: [id number]. TRESIBA FLEX INJ 100UNIT is denied due to Plan Exclusion.  MD please advise.

## 2017-08-03 NOTE — Telephone Encounter (Signed)
Call pt see if she is actually now with a Dr. Pershing Cox at [facility] as her new PCP?  Based on referrals, this doctor referred her to endocrinology, so she has appt on 7/19 If that is the case, if she has enough insulin, I am not changing her medications and the endocrinologist can change it since the insurance doesn't cover Tyler Aas

## 2017-08-06 ENCOUNTER — Ambulatory Visit (INDEPENDENT_AMBULATORY_CARE_PROVIDER_SITE_OTHER): Payer: Commercial Managed Care - PPO | Admitting: Endocrinology

## 2017-08-06 ENCOUNTER — Encounter: Payer: Self-pay | Admitting: Endocrinology

## 2017-08-06 VITALS — BP 106/68 | HR 84 | Ht 63.0 in | Wt 136.4 lb

## 2017-08-06 DIAGNOSIS — E119 Type 2 diabetes mellitus without complications: Secondary | ICD-10-CM | POA: Diagnosis not present

## 2017-08-06 DIAGNOSIS — I10 Essential (primary) hypertension: Secondary | ICD-10-CM | POA: Diagnosis not present

## 2017-08-06 DIAGNOSIS — E1165 Type 2 diabetes mellitus with hyperglycemia: Secondary | ICD-10-CM

## 2017-08-06 DIAGNOSIS — Z794 Long term (current) use of insulin: Secondary | ICD-10-CM | POA: Diagnosis not present

## 2017-08-06 LAB — POCT GLYCOSYLATED HEMOGLOBIN (HGB A1C): Hemoglobin A1C: 7.3 % — AB (ref 4.0–5.6)

## 2017-08-06 LAB — GLUCOSE, POCT (MANUAL RESULT ENTRY): POC GLUCOSE: 90 mg/dL (ref 70–99)

## 2017-08-06 MED ORDER — INSULIN GLARGINE 300 UNIT/ML ~~LOC~~ SOPN: 26.0000 [IU] | PEN_INJECTOR | Freq: Every day | SUBCUTANEOUS | 1 refills | Status: DC

## 2017-08-06 MED ORDER — FREESTYLE LIBRE 14 DAY SENSOR MISC: 1.0000 [IU] | 4 refills | Status: DC

## 2017-08-06 MED ORDER — FREESTYLE LIBRE 14 DAY READER DEVI: 1.0000 | Freq: Once | 0 refills | Status: AC

## 2017-08-06 NOTE — Patient Instructions (Addendum)
Check blood sugars on waking up  3/7 days  Also check blood sugars about 2 hours after a meal and do this after different meals by rotation  Recommended blood sugar levels on waking up is 90-130 and about 2 hours after meal is 130-160  Please bring your blood sugar monitor to each visit, thank you Accu-Chek guide me otc  No Pepsi  Exercise

## 2017-08-06 NOTE — Progress Notes (Addendum)
Patient ID: Stacy [name], female   DOB: [birth year], 52 y.o.   MRN: [id number]          Reason for Appointment: Consultation for Type 2 Diabetes  Referring physician: Emi Belfast     History of Present Illness:          Date of diagnosis of type 2 diabetes mellitus: 2014        Background history:    She apparently was asymptomatic at the time of diagnosis She was started on metformin but no details of initial management or A1c are available She thinks about 3 years ago she was started on Farxiga also with improved control and no side effects with this She has mostly been followed by a PCP but may have seen an endocrinologist also About 1-1/2-2 years ago she was also given basal insulin with Tyler Aas presumably for higher sugars but not clear what her A1c was at that time  Recent history:   INSULIN regimen is:  [place] U-100 24 units daily     Non-insulin hypoglycemic drugs the patient is taking are: Jardiance 25 mg daily, metformin 850 mg twice daily  Current management, blood sugar patterns and problems identified:  She does not have a functioning glucose meter and does not know which brand she was using  She think most of her blood sugars in the mornings have been about 130 but she does not monitor after meals  She has questions about her diet and meal planning since she likes to eat fruit  She also drinks a can of regular soft drink once a day  Sometimes will have fast food breakfast  Her largest meal is at lunchtime and this is away from home frequently, sometimes fast food or pizza  She tends to have milk with every meal and also likes to have potato chips for snacks  She thinks she has difficulty gaining weight which she wants to is sometimes afraid to eat because of her sugar going up or not knowing what effect it will have  She also has concern about possible side effect of extremity amputation with Jardiance          Side effects from medications have  been: Diarrhea from metformin  Compliance with the medical regimen:  Fair Hypoglycemia: none   Glucose monitoring:  done?  1 times a day         Glucometer: none      Blood Glucose readings by recall   PREMEAL Breakfast Lunch Dinner Bedtime  Overall   Glucose range: 130      Median:        POST-MEAL PC Breakfast PC Lunch PC Dinner  Glucose range:  ? ?  Median:      Self-care: The diet that the patient has been following is: tries to limit fried food.       Typical meal intake: Breakfast is occasionally fast food or Glucerna, sometimes none               Dietician visit, most recent: None               Exercise: none, does some walking at work although low programmed exercise  Weight history:  Wt Readings from Last 3 Encounters:  08/06/17 136 lb 6.4 oz (61.9 kg)  05/28/17 142 lb (64.4 kg)  05/19/17 142 lb (64.4 kg)    Glycemic control:   Lab Results  Component Value Date   HGBA1C 7.3 (A) 08/06/2017   HGBA1C  7.6 (H) 01/25/2017   HGBA1C 7.9 11/28/2016   Lab Results  Component Value Date   MICROALBUR 0.2 01/01/2015   LDLCALC 115 05/01/2015   CREATININE 0.76 05/19/2017   Lab Results  Component Value Date   MICRALBCREAT 6 01/01/2015    No results found for: FRUCTOSAMINE    Allergies as of 08/06/2017      Reactions   Almond Meal Anaphylaxis, Swelling   Aspirin Nausea Only      Medication List        Accurate as of 08/06/17  2:31 PM. Always use your most recent med list.          albuterol 108 (90 Base) MCG/ACT inhaler Commonly known as:  PROVENTIL HFA;VENTOLIN HFA Inhale 2 puffs into the lungs every 4 (four) hours as needed for wheezing or shortness of breath.   allopurinol 100 MG tablet Commonly known as:  ZYLOPRIM TAKE ONE TABLET BY MOUTH DAILY. START ON 04/28/2017.   atorvastatin 20 MG tablet Commonly known as:  LIPITOR Take 1 tablet (20 mg total) by mouth daily.   buPROPion 100 MG tablet Commonly known as:  WELLBUTRIN TAKE 1 TABLET BY  MOUTH TWICE A DAY   colchicine 0.6 MG tablet Take 1 tablet (0.6 mg total) by mouth daily.   empagliflozin 25 MG Tabs tablet Commonly known as:  JARDIANCE Take 25 mg by mouth daily.   EPINEPHrine 0.3 mg/0.3 mL Soaj injection Commonly known as:  EPI-PEN Insert into muscle once with any facial or neck swelling  Instruct in use   FLUoxetine 40 MG capsule Commonly known as:  PROZAC TAKE 1 CAPSULE BY MOUTH DAILY.   FREESTYLE LIBRE 14 DAY READER Devi 1 Device by Does not apply route once for 1 dose.   FREESTYLE LIBRE 14 DAY SENSOR Misc 1 Units by Does not apply route every 14 (fourteen) days.   glucose blood test strip Use as instructed to monitor FSBS 3x daily. Dx: E11.65   insulin degludec 100 UNIT/ML Sopn FlexTouch Pen Commonly known as:  TRESIBA FLEXTOUCH Inject 0.24 mLs (24 Units total) into the skin at bedtime.   Insulin Glargine 300 UNIT/ML Sopn Commonly known as:  TOUJEO SOLOSTAR Inject 26 Units into the skin daily.   Lancets Misc Use as instructed to monitor FSBS 3x daily. Dx: E11.65   metFORMIN 850 MG tablet Commonly known as:  GLUCOPHAGE Take 1 tablet (850 mg total) by mouth 2 (two) times daily with a meal.   ONE-A-DAY WOMENS VITACRAVES Chew Chew 1 each by mouth daily.   pantoprazole 40 MG tablet Commonly known as:  PROTONIX TAKE 1 TABLET (40 MG TOTAL) BY MOUTH DAILY.   SUMAtriptan 100 MG tablet Commonly known as:  IMITREX Take 1 tablet (100 mg total) by mouth every 2 (two) hours as needed for migraine. May repeat in 2 hours if headache persists or recurs.   valsartan-hydrochlorothiazide 320-25 MG tablet Commonly known as:  DIOVAN-HCT Take 1 tablet by mouth daily.   zolpidem 10 MG tablet Commonly known as:  AMBIEN Take 1 tablet (10 mg total) by mouth at bedtime as needed for sleep.       Allergies:  Allergies  Allergen Reactions  . Almond Meal Anaphylaxis and Swelling  . Aspirin Nausea Only    Past Medical History:  Diagnosis Date  . Adrenal  adenoma    as per MRI 03/09/14  . Anxiety   . Diabetes mellitus   . Diverticulosis   . Elevated LFTs    elevated since 09/2013  .  Fatty liver    moderate- as per 03/06/17 MRI liver  . GERD (gastroesophageal reflux disease)   . Gout   . H/O hiatal hernia   . Headache(784.0)   . History of depression   . Hyperlipemia   . Hyperplastic colon polyp   . Hypertension   . Positive hepatitis C antibody test    RNA NEGATIVE  . Thyroid nodule  managed Dr. Posey Pronto 02/27/2014  . Tobacco abuse   . Tubular adenoma of colon     Past Surgical History:  Procedure Laterality Date  . ANAL FISTULECTOMY  01/19/1985  . BREAST EXCISIONAL BIOPSY Left   . BREAST LUMPECTOMY Left 1988  . PAROTIDECTOMY  03/30/2011   Procedure: PAROTIDECTOMY;  Surgeon: Izora Gala, MD;  Location: [facility];  Service: ENT;  Laterality: Right;  . WRIST GANGLION EXCISION Left 1988    Family History  Problem Relation Age of Onset  . Coronary artery disease Father 33  . Hypertension Mother   . Diabetes Sister   . Cancer Sister 28       uterine cancer  . Breast cancer Neg Hx     Social History:  reports that she has been smoking cigarettes.  She has a 28.00 pack-year smoking history. She has never used smokeless tobacco. She reports that she drinks about 8.4 oz of alcohol per week. She reports that she does not use drugs.   Review of Systems  Constitutional: Positive for reduced appetite.  HENT: Negative for trouble swallowing.        Has history of migraines  Eyes: Negative for blurred vision.  Respiratory: Negative for shortness of breath.   Cardiovascular: Negative for chest pain and leg swelling.  Gastrointestinal: Negative for diarrhea.  Genitourinary: Negative for frequency.  Musculoskeletal: Positive for joint pain.       She has pain in her lower extremity joints especially in the morning  Neurological: Negative for numbness and tingling.  Psychiatric/Behavioral: Positive for insomnia.        Depression controlled with Prozac   Has had a right adrenal adenoma diagnosed initially in 02/2014 and stable on further imaging subsequently  Lipid history: Currently managed by PCP with atorvastatin 20 mg Most recent LDL 44 done in 03/2017    Lab Results  Component Value Date   CHOL 216 (H) 05/01/2015   HDL 42 (L) 05/01/2015   LDLCALC 115 05/01/2015   TRIG 293 (H) 05/01/2015   CHOLHDL 5.1 (H) 05/01/2015           Hypertension: This has been present for several years, medications include valsartan HCT  BP Readings from Last 3 Encounters:  08/06/17 106/68  05/28/17 (!) 139/95  05/19/17 124/76    Most recent eye exam was in 2018  Most recent foot exam:7/19    LABS:  Office Visit on 08/06/2017  Component Date Value Ref Range Status  . Hemoglobin A1C 08/06/2017 7.3* 4.0 - 5.6 % Final  . POC Glucose 08/06/2017 90  70 - 99 mg/dl Final    Physical Examination:  BP 106/68 (BP Location: Left Arm, Patient Position: Sitting, Cuff Size: Normal)   Pulse 84   Ht 5\' 3"  (1.6 m)   Wt 136 lb 6.4 oz (61.9 kg)   SpO2 96%   BMI 24.16 kg/m   GENERAL:  She is averagely built and nourished  HEENT:         Eye exam shows normal external appearance.  Fundus exam shows no retinopathy.  Oral exam shows  normal mucosa .  NECK:   There is no lymphadenopathy  Thyroid is mildly enlarged especially in the isthmus, smooth and no nodules felt.  Carotids are normal to palpation and no bruit heard  LUNGS:         Chest is symmetrical. Lungs are clear to auscultation.Marland Kitchen   HEART:         Heart sounds:  S1 and S2 are normal. No murmur or click heard., no S3 or S4.   ABDOMEN:   There is no distention present. Liver and spleen are not palpable. No other mass or tenderness present.    NEUROLOGICAL:   Ankle jerks are absent bilaterally.    Diabetic Foot Exam - Simple   Simple Foot Form Diabetic Foot exam was performed with the following findings:  Yes 08/06/2017  2:31 PM  Visual Inspection No  deformities, no ulcerations, no other skin breakdown bilaterally:  Yes Sensation Testing Intact to touch and monofilament testing bilaterally:  Yes Pulse Check Posterior Tibialis and Dorsalis pulse intact bilaterally:  Yes Comments            Vibration sense is mild to moderately reduced in distal first toes. MUSCULOSKELETAL:  There is no swelling or deformity of the peripheral joints.     EXTREMITIES:     There is no edema.  SKIN:       No rash or lesions of concern.        ASSESSMENT:  Diabetes type 2, on basal insulin, metformin and Jardiance  See history of present illness for detailed discussion of current diabetes management, blood sugar patterns and problems identified  Currently patient has persistently high A1c over 7% She has been on basal insulin for some time but since her fasting glucose is usually around 130 at home by history she likely has postprandial hyperglycemia This is partly related to inconsistent diet, use of regular soft drinks, likely relative insulin deficiency at mealtimes She does not do any formal exercise She does need more diabetes education especially with meal planning and knowledge about blood sugar patterns, differences between basal and bolus insulin Discussed likelihood of progression of her diabetes over time  At this time she does not have a glucose monitor and not clear if she has postprandial hyperglycemia  History of arthralgia: Discussed that this is likely unrelated to her diabetes  Complications of diabetes: None evident but needs evaluation of urine microalbumin  History of hypertension, hyperlipidemia, depression, alcohol abuse, abnormal liver functions and hyperuricemia followed by PCP  PLAN:    Restart monitoring blood sugar  She can either use the freestyle libre system or Accu-Chek guide me meter which ever her insurance covers better, prescription sent for freestyle libre  Discussed use of the freestyle glucose sensor and  how this will be used and would help especially with evaluating postprandial patterns at various times  Discussed checking blood sugar by rotation at different times of the day especially after meals  She needs consultation with the dietitian for meal planning and answering her questions about various food items  Encourage her to start walking for exercise  She has consistent postprandial hyperglycemia would consider adding mealtime insulin at her main meal  Reassured her about the safety of Jardiance and no evidence for extremity amputations in the medication studies  Since she has relative intolerance to regular metformin she may be also  benefit from using Synjardy XR on her next prescription  Also since her insurance apparently is not going to cover her  TRESIBA she will likely need to switch to Toujeo  Discussed differences between [place] and likelihood of needing 2 to 4 units more of Toujeo, this would be adjusted on a weekly basis based on her fasting blood sugars  Discussed blood sugar targets both fasting and postprandial  Check fructosamine, urine microalbumin on the next visit    Patient Instructions  Check blood sugars on waking up  3/7 days  Also check blood sugars about 2 hours after a meal and do this after different meals by rotation  Recommended blood sugar levels on waking up is 90-130 and about 2 hours after meal is 130-160  Please bring your blood sugar monitor to each visit, thank you Accu-Chek guide me otc  No Pepsi  Exercise   Counseling time on subjects discussed in assessment and plan sections is over 50% of today's 60 minute visit   Consultation note has been sent to the referring physician  Elayne Snare 08/06/2017, 2:31 PM   Note: This office note was prepared with Dragon voice recognition system technology. Any transcriptional errors that result from this process are unintentional.

## 2017-08-23 ENCOUNTER — Emergency Department (HOSPITAL_COMMUNITY): Payer: Commercial Managed Care - PPO

## 2017-08-23 ENCOUNTER — Encounter (HOSPITAL_COMMUNITY): Payer: Self-pay | Admitting: Emergency Medicine

## 2017-08-23 ENCOUNTER — Other Ambulatory Visit: Payer: Self-pay

## 2017-08-23 ENCOUNTER — Emergency Department (HOSPITAL_COMMUNITY)
Admission: EM | Admit: 2017-08-23 | Discharge: 2017-08-23 | Disposition: A | Discharge status: Home / Self Care | Payer: Commercial Managed Care - PPO | Attending: Emergency Medicine | Admitting: Emergency Medicine

## 2017-08-23 DIAGNOSIS — Z794 Long term (current) use of insulin: Secondary | ICD-10-CM | POA: Insufficient documentation

## 2017-08-23 DIAGNOSIS — R05 Cough: Secondary | ICD-10-CM | POA: Diagnosis present

## 2017-08-23 DIAGNOSIS — F17210 Nicotine dependence, cigarettes, uncomplicated: Secondary | ICD-10-CM | POA: Insufficient documentation

## 2017-08-23 DIAGNOSIS — I10 Essential (primary) hypertension: Secondary | ICD-10-CM | POA: Insufficient documentation

## 2017-08-23 DIAGNOSIS — E119 Type 2 diabetes mellitus without complications: Secondary | ICD-10-CM | POA: Insufficient documentation

## 2017-08-23 DIAGNOSIS — Z79899 Other long term (current) drug therapy: Secondary | ICD-10-CM | POA: Insufficient documentation

## 2017-08-23 DIAGNOSIS — J40 Bronchitis, not specified as acute or chronic: Secondary | ICD-10-CM | POA: Insufficient documentation

## 2017-08-23 MED ORDER — PREDNISONE 20 MG PO TABS
60.0000 mg | ORAL_TABLET | Freq: Once | ORAL | Status: DC
  Filled 2017-08-23: qty 3

## 2017-08-23 MED ORDER — ALBUTEROL SULFATE (2.5 MG/3ML) 0.083% IN NEBU
5.0000 mg | INHALATION_SOLUTION | Freq: Once | RESPIRATORY_TRACT | Status: AC
  Administered 2017-08-23: 5 mg via RESPIRATORY_TRACT
  Filled 2017-08-23: qty 6

## 2017-08-23 MED ORDER — ACETAMINOPHEN 325 MG PO TABS
650.0000 mg | ORAL_TABLET | Freq: Once | ORAL | Status: AC
  Administered 2017-08-23: 650 mg via ORAL
  Filled 2017-08-23: qty 2

## 2017-08-23 MED ORDER — AZITHROMYCIN 250 MG PO TABS
500.0000 mg | ORAL_TABLET | Freq: Once | ORAL | Status: AC
  Administered 2017-08-23: 500 mg via ORAL
  Filled 2017-08-23: qty 2

## 2017-08-23 MED ORDER — BENZONATATE 100 MG PO CAPS: 100.0000 mg | ORAL_CAPSULE | Freq: Three times a day (TID) | ORAL | 0 refills | Status: DC

## 2017-08-23 MED ORDER — IPRATROPIUM BROMIDE 0.02 % IN SOLN
0.5000 mg | Freq: Once | RESPIRATORY_TRACT | Status: AC
  Administered 2017-08-23: 0.5 mg via RESPIRATORY_TRACT
  Filled 2017-08-23: qty 2.5

## 2017-08-23 MED ORDER — BENZONATATE 100 MG PO CAPS
100.0000 mg | ORAL_CAPSULE | Freq: Once | ORAL | Status: AC
  Administered 2017-08-23: 100 mg via ORAL
  Filled 2017-08-23: qty 1

## 2017-08-23 MED ORDER — PREDNISONE 20 MG PO TABS: ORAL_TABLET | ORAL | 0 refills | Status: DC

## 2017-08-23 MED ORDER — ALBUTEROL SULFATE HFA 108 (90 BASE) MCG/ACT IN AERS
2.0000 | INHALATION_SPRAY | RESPIRATORY_TRACT | Status: DC | PRN
  Administered 2017-08-23: 2 via RESPIRATORY_TRACT
  Filled 2017-08-23: qty 6.7

## 2017-08-23 MED ORDER — AZITHROMYCIN 250 MG PO TABS: ORAL_TABLET | ORAL | 0 refills | Status: DC

## 2017-08-23 NOTE — ED Triage Notes (Signed)
Pt. Stated, I think I have the flu. Im having nasal congestion, cough and might be a sinus infection Im having facial pain.

## 2017-08-23 NOTE — ED Provider Notes (Signed)
[facility] EMERGENCY DEPARTMENT Provider Note   CSN: [id number] Arrival date & time: 08/23/17  4098     History   Chief Complaint Chief Complaint  Patient presents with  . Nasal Congestion  . Facial Pain  . Cough    HPI Stacy [name] is a 52 y.o. female.  The history is provided by the patient. No language interpreter was used.  Cough      52 year old female presenting with cold symptoms.  For the past 3 days she has had subjective fever, chills, cough productive with [place] phlegm, wheezing, headache, sinus pain, nasal discharge.  Symptoms persistent not improved with over-the-counter medication.  No associated nausea vomiting diarrhea, ear pain, sore throat.  She has had a flu shot.  She denies any recent travel.  She denies any history of asthma but states that she did use an inhaler in the past to help with her bronchitis.     Past Medical History:  Diagnosis Date  . Adrenal adenoma    as per MRI 03/09/14  . Anxiety   . Diabetes mellitus   . Diverticulosis   . Elevated LFTs    elevated since 09/2013  . Fatty liver    moderate- as per 03/06/17 MRI liver  . GERD (gastroesophageal reflux disease)   . Gout   . H/O hiatal hernia   . Headache(784.0)   . History of depression   . Hyperlipemia   . Hyperplastic colon polyp   . Hypertension   . Positive hepatitis C antibody test    RNA NEGATIVE  . Thyroid nodule  managed Dr. Posey Pronto 02/27/2014  . Tobacco abuse   . Tubular adenoma of colon     Patient Active Problem List   Diagnosis Date Noted  . Insomnia 01/01/2015  . Fatty liver 01/01/2015  . Adrenal mass, right Dr Erskine Squibb 03/13/2014  . Thyroid nodule  managed Dr. Posey Pronto 02/27/2014  . EtOH dependence ([employer]) 11/22/2013  . Bereavement 10/04/2013  . Tobacco use 10/04/2013  . Situational depression 08/23/2013  . Colon polyp 08/23/2013  . Migraines 08/23/2013  . GERD (gastroesophageal reflux disease)   . Depression with anxiety   .  Diabetes mellitus, type II, insulin dependent ([place])   . Gout   . Hypertension   . Hyperlipemia     Past Surgical History:  Procedure Laterality Date  . ANAL FISTULECTOMY  01/19/1985  . BREAST EXCISIONAL BIOPSY Left   . BREAST LUMPECTOMY Left 1988  . PAROTIDECTOMY  03/30/2011   Procedure: PAROTIDECTOMY;  Surgeon: Izora Gala, MD;  Location: [facility];  Service: ENT;  Laterality: Right;  . WRIST GANGLION EXCISION Left 1988     OB History   None      Home Medications    Prior to Admission medications   Medication Sig Start Date End Date Taking? Authorizing Provider  albuterol (PROVENTIL HFA;VENTOLIN HFA) 108 (90 Base) MCG/ACT inhaler Inhale 2 puffs into the lungs every 4 (four) hours as needed for wheezing or shortness of breath. 01/25/17   Alycia Rossetti, MD  allopurinol (ZYLOPRIM) 100 MG tablet TAKE ONE TABLET BY MOUTH DAILY. START ON 04/28/2017. 05/19/17   Marybelle Killings, MD  atorvastatin (LIPITOR) 20 MG tablet Take 1 tablet (20 mg total) by mouth daily. 07/06/17   Alycia Rossetti, MD  buPROPion [facility]) 100 MG tablet TAKE 1 TABLET BY MOUTH TWICE A DAY 05/19/17   Alycia Rossetti, MD  colchicine 0.6 MG tablet Take 1  tablet (0.6 mg total) by mouth daily. 05/28/17   Marybelle Killings, MD  Continuous Blood Gluc Sensor (FREESTYLE LIBRE 14 DAY SENSOR) MISC 1 Units by Does not apply route every 14 (fourteen) days. 08/06/17   Elayne Snare, MD  empagliflozin (JARDIANCE) 25 MG TABS tablet Take 25 mg by mouth daily. 07/06/17   Alycia Rossetti, MD  EPINEPHrine 0.3 mg/0.3 mL IJ SOAJ injection Insert into muscle once with any facial or neck swelling  Instruct in use 01/25/17   [place], Modena Nunnery, MD  FLUoxetine (PROZAC) 40 MG capsule TAKE 1 CAPSULE BY MOUTH DAILY. 08/03/17   [place], Modena Nunnery, MD  glucose blood test strip Use as instructed to monitor FSBS 3x daily. Dx: E11.65 01/02/15   Alycia Rossetti, MD  insulin degludec (TRESIBA FLEXTOUCH) 100 UNIT/ML SOPN FlexTouch Pen Inject 0.24  mLs (24 Units total) into the skin at bedtime. 07/06/17   [place], Modena Nunnery, MD  Insulin Glargine (TOUJEO SOLOSTAR) 300 UNIT/ML SOPN Inject 26 Units into the skin daily. 08/06/17   Elayne Snare, MD  Lancets MISC Use as instructed to monitor FSBS 3x daily. Dx: E11.65 01/03/15   Alycia Rossetti, MD  metFORMIN (GLUCOPHAGE) 850 MG tablet Take 1 tablet (850 mg total) by mouth 2 (two) times daily with a meal. 07/15/17   [place], Modena Nunnery, MD  Multiple Vitamins-Minerals (ONE-A-DAY WOMENS VITACRAVES) CHEW Chew 1 each by mouth daily.    [provider]  pantoprazole (PROTONIX) 40 MG tablet TAKE 1 TABLET (40 MG TOTAL) BY MOUTH DAILY. 07/06/17   [place], Modena Nunnery, MD  SUMAtriptan (IMITREX) 100 MG tablet Take 1 tablet (100 mg total) by mouth every 2 (two) hours as needed for migraine. May repeat in 2 hours if headache persists or recurs. 08/05/15   [place], Modena Nunnery, MD  valsartan-hydrochlorothiazide (DIOVAN-HCT) 320-25 MG tablet Take 1 tablet by mouth daily. 07/06/17   [place], Modena Nunnery, MD  zolpidem (AMBIEN) 10 MG tablet Take 1 tablet (10 mg total) by mouth at bedtime as needed for sleep. 07/06/17 08/05/17  Alycia Rossetti, MD    Family History Family History  Problem Relation Age of Onset  . Coronary artery disease Father 98  . Hypertension Mother   . Diabetes Sister   . Cancer Sister 64       uterine cancer  . Breast cancer Neg Hx     Social History Social History   Tobacco Use  . Smoking status: Current Every Day Smoker    Packs/day: 1.00    Years: 28.00    Pack years: 28.00    Types: Cigarettes  . Smokeless tobacco: Never Used  Substance Use Topics  . Alcohol use: Yes    Alcohol/week: 8.4 oz    Types: 14 Standard drinks or equivalent per week  . Drug use: No     Allergies   Almond meal and Aspirin   Review of Systems Review of Systems  Respiratory: Positive for cough.   All other systems reviewed and are negative.    Physical Exam Updated Vital Signs BP (!) 166/96  (BP Location: Right Arm)   Pulse (!) 101   Temp 99.7 F (37.6 C) (Oral)   Resp 16   Ht 5\' 3"  (1.6 m)   Wt 62.6 kg (138 lb)   SpO2 96%   BMI 24.45 kg/m   Physical Exam  Constitutional: She is oriented to person, place, and time. She appears well-developed and well-nourished. No distress.  HENT:  Head: Atraumatic.  Ears:  TMs normal bilaterally Nose: Normal nares Throat: Uvula midline no tonsillar enlargement or exudates, no trismus Tenderness to maxillary sinus on percussion.  Eyes: Conjunctivae are normal.  Neck: Neck supple.  Cardiovascular: Normal rate and regular rhythm.  Pulmonary/Chest: She has wheezes (Decreased breath sounds with both inspiratory and expiratory wheezes no rales or rhonchi).  Abdominal: Soft. She exhibits no distension. There is no tenderness.  Neurological: She is alert and oriented to person, place, and time.  Skin: No rash noted.  Psychiatric: She has a normal mood and affect.  Nursing note and vitals reviewed.    ED Treatments / Results  Labs (all labs ordered are listed, but only abnormal results are displayed) Labs Reviewed - No data to display  EKG None  Radiology Dg Chest 2 View  Result Date: 08/23/2017 CLINICAL DATA:  Flu-like symptoms, history of tobacco use EXAM: CHEST - 2 VIEW COMPARISON:  10/06/2017 FINDINGS: Cardiac shadows within normal limits. Small hiatal hernia is again noted. The lungs are well aerated bilaterally. No focal infiltrate or sizable effusion is seen. No bony abnormality is noted. IMPRESSION: No active cardiopulmonary disease. Electronically Signed   By: Inez Catalina M.D.   On: 08/23/2017 09:18    Procedures Procedures (including critical care time)  Medications Ordered in ED Medications  albuterol (PROVENTIL HFA;VENTOLIN HFA) 108 (90 Base) MCG/ACT inhaler 2 puff (2 puffs Inhalation Given 08/23/17 1049)  predniSONE (DELTASONE) tablet 60 mg (60 mg Oral Not Given 08/23/17 1049)  albuterol (PROVENTIL) (2.5 MG/3ML) 0.083%  nebulizer solution 5 mg (5 mg Nebulization Given 08/23/17 0933)  ipratropium (ATROVENT) nebulizer solution 0.5 mg (0.5 mg Nebulization Given 08/23/17 0933)  benzonatate (TESSALON) capsule 100 mg (100 mg Oral Given 08/23/17 0933)  acetaminophen (TYLENOL) tablet 650 mg (650 mg Oral Given 08/23/17 1049)  azithromycin (ZITHROMAX) tablet 500 mg (500 mg Oral Given 08/23/17 1049)     Initial Impression / Assessment and Plan / ED Course  I have reviewed the triage vital signs and the nursing notes.  Pertinent labs & imaging results that were available during my care of the patient were reviewed by me and considered in my medical decision making (see chart for details).     BP (!) 148/79   Pulse 99   Temp 99.7 F (37.6 C) (Oral)   Resp 18   Ht 5\' 3"  (1.6 m)   Wt 62.6 kg (138 lb)   SpO2 96%   BMI 24.45 kg/m    Final Clinical Impressions(s) / ED Diagnoses   Final diagnoses:  Bronchitis    ED Discharge Orders        Ordered    predniSONE (DELTASONE) 20 MG tablet     08/23/17 1111    benzonatate (TESSALON) 100 MG capsule  Every 8 hours     08/23/17 1111    azithromycin (ZITHROMAX Z-PAK) 250 MG tablet     08/23/17 1111     9:05 AM Patient here with cold symptoms.  She also has decreased breath sounds with inspiratory and expiratory wheezes suggestive of potential bronchitis.  Chest x-ray ordered to rule out pneumonia.  We will give her a DuoNeb breathing treatment as well as cough medication.  9:22 AM CXR unremarkable.  Patient received breathing treatment, felt a bit better.  Ambulate without hypoxia.  She does voice concern for potential infection and states that her mom has horrible complication with pneumonia causing her to be on ventilator.  Given his symptoms of both sinus and respiratory problem, patient will be given  a Z-Pak to cover for potential atypical pneumonia.  Patient will be treated for bronchitis with steroid, cough medication and albuterol inhaler.  Return precautions  discussed.  Patient has history of diabetes, will need to monitor blood sugar closely while being on prednisone.   Domenic Moras, PA-C 08/23/17 1114    Quintella Reichert, MD 08/24/17 [phone]

## 2017-08-23 NOTE — ED Notes (Signed)
Pt. Ambulated. PO stayed between 93-96. No complaint of dizziness or trouble breathing.

## 2017-08-26 ENCOUNTER — Encounter: Payer: Commercial Managed Care - PPO | Admitting: Dietician

## 2017-09-05 ENCOUNTER — Other Ambulatory Visit: Payer: Self-pay | Admitting: Family Medicine

## 2017-09-10 LAB — HM PAP SMEAR

## 2017-09-28 ENCOUNTER — Ambulatory Visit: Payer: Commercial Managed Care - PPO | Admitting: Endocrinology

## 2017-09-29 ENCOUNTER — Other Ambulatory Visit: Payer: Self-pay | Admitting: Family Medicine

## 2017-10-12 ENCOUNTER — Other Ambulatory Visit: Payer: Self-pay

## 2017-10-12 MED ORDER — FREESTYLE LIBRE 14 DAY SENSOR MISC: 1.0000 [IU] | 4 refills | Status: DC

## 2017-11-01 ENCOUNTER — Telehealth: Payer: Self-pay | Admitting: Endocrinology

## 2017-11-01 NOTE — Telephone Encounter (Signed)
Dr. Emi Belfast at [facility] is requesting a call regarding patient. Can reach her at [phone]

## 2017-11-02 LAB — HEMOGLOBIN A1C: HEMOGLOBIN A1C: 6.7 % — AB (ref 4.0–5.6)

## 2017-11-04 NOTE — Telephone Encounter (Signed)
Called Dr. Frederich Balding she states she spoke with Dr. [name] already and we need to try to work her in soon. I do not see a note per this conversation she discussed. I have left a message for her to call back to do this for her

## 2017-11-25 ENCOUNTER — Telehealth: Payer: Self-pay | Admitting: Endocrinology

## 2017-11-25 NOTE — Telephone Encounter (Signed)
Pt due for surveillance colon in Dec 2019 This can be scheduled now and once scheduled the recall can be removed Thanks JMP

## 2017-11-25 NOTE — Telephone Encounter (Signed)
Patient also requests Free Style Libre 14 day sensor.   She is out of both insulin injector and sensor.

## 2017-11-25 NOTE — Telephone Encounter (Signed)
Per [facility] "Caller states that she needs a prescription for her insulin injector. She doesn't know the of the medication." Additional "Please call into [facility]. Phone number [phone].'

## 2017-11-26 ENCOUNTER — Other Ambulatory Visit: Payer: Self-pay

## 2017-11-26 MED ORDER — INSULIN DEGLUDEC 100 UNIT/ML ~~LOC~~ SOPN: PEN_INJECTOR | SUBCUTANEOUS | 2 refills | Status: DC

## 2017-11-26 MED ORDER — FREESTYLE LIBRE 14 DAY SENSOR MISC: 1.0000 [IU] | 4 refills | Status: DC

## 2017-11-26 NOTE — Telephone Encounter (Signed)
Sent Tyler Aas and sensors

## 2017-12-04 ENCOUNTER — Telehealth: Payer: Self-pay | Admitting: *Deleted

## 2017-12-04 NOTE — Telephone Encounter (Signed)
Received prior authorization request for [place].   Advised pharmacy to send to Dr. Elayne Snare, endocrinology.

## 2017-12-10 ENCOUNTER — Other Ambulatory Visit (INDEPENDENT_AMBULATORY_CARE_PROVIDER_SITE_OTHER): Payer: Commercial Managed Care - PPO

## 2017-12-10 DIAGNOSIS — E119 Type 2 diabetes mellitus without complications: Secondary | ICD-10-CM | POA: Diagnosis not present

## 2017-12-10 DIAGNOSIS — Z794 Long term (current) use of insulin: Secondary | ICD-10-CM

## 2017-12-10 LAB — MICROALBUMIN / CREATININE URINE RATIO
Creatinine,U: 381.4 mg/dL
MICROALB UR: 24.8 mg/dL — AB (ref 0.0–1.9)
MICROALB/CREAT RATIO: 6.5 mg/g (ref 0.0–30.0)

## 2017-12-10 LAB — BASIC METABOLIC PANEL
BUN: 16 mg/dL (ref 6–23)
CALCIUM: 10 mg/dL (ref 8.4–10.5)
CO2: 31 mEq/L (ref 19–32)
Chloride: 100 mEq/L (ref 96–112)
Creatinine, Ser: 0.72 mg/dL (ref 0.40–1.20)
GFR: 108.88 mL/min (ref >60.00)
Glucose, Bld: 143 mg/dL — ABNORMAL HIGH (ref 70–99)
Potassium: 3 mEq/L — ABNORMAL LOW (ref 3.5–5.1)
SODIUM: 140 meq/L (ref 135–145)

## 2017-12-10 NOTE — Addendum Note (Signed)
Addended by: Kaylyn Lim I on: 12/10/2017 02:04 PM   Modules accepted: Orders

## 2017-12-11 LAB — FRUCTOSAMINE: Fructosamine: 239 umol/L (ref 0–285)

## 2017-12-11 LAB — BASIC METABOLIC PANEL
BUN/Creatinine Ratio: 22 (ref 9–23)
BUN: 15 mg/dL (ref 6–24)
CO2: 26 mmol/L (ref 20–29)
CREATININE: 0.68 mg/dL (ref 0.57–1.00)
Calcium: 9.9 mg/dL (ref 8.7–10.2)
Chloride: 97 mmol/L (ref 96–106)
GFR, EST AFRICAN AMERICAN: 115 mL/min/{1.73_m2} (ref >59)
GFR, EST NON AFRICAN AMERICAN: 100 mL/min/{1.73_m2} (ref >59)
Glucose: 134 mg/dL — ABNORMAL HIGH (ref 65–99)
POTASSIUM: 3.4 mmol/L — AB (ref 3.5–5.2)
SODIUM: 140 mmol/L (ref 134–144)

## 2017-12-14 ENCOUNTER — Ambulatory Visit (INDEPENDENT_AMBULATORY_CARE_PROVIDER_SITE_OTHER): Payer: Commercial Managed Care - PPO | Admitting: Endocrinology

## 2017-12-14 ENCOUNTER — Encounter: Payer: Self-pay | Admitting: Endocrinology

## 2017-12-14 VITALS — BP 150/88 | HR 88 | Ht 63.0 in | Wt 138.8 lb

## 2017-12-14 DIAGNOSIS — E876 Hypokalemia: Secondary | ICD-10-CM | POA: Diagnosis not present

## 2017-12-14 DIAGNOSIS — I10 Essential (primary) hypertension: Secondary | ICD-10-CM

## 2017-12-14 DIAGNOSIS — Z794 Long term (current) use of insulin: Secondary | ICD-10-CM

## 2017-12-14 DIAGNOSIS — E119 Type 2 diabetes mellitus without complications: Secondary | ICD-10-CM

## 2017-12-14 LAB — GLUCOSE, POCT (MANUAL RESULT ENTRY): POC GLUCOSE: 137 mg/dL — AB (ref 70–99)

## 2017-12-14 MED ORDER — SPIRONOLACTONE 25 MG PO TABS: 25.0000 mg | ORAL_TABLET | Freq: Every day | ORAL | 0 refills | Status: DC

## 2017-12-14 NOTE — Patient Instructions (Signed)
Check blood sugars on waking up 3 days a week  Also check blood sugars about 2 hours after meals and do this after different meals by rotation  Recommended blood sugar levels on waking up are 90-130 and about 2 hours after meal is 130-160  Please bring your blood sugar monitor to each visit, thank you   

## 2017-12-14 NOTE — Progress Notes (Signed)
Patient ID: Stacy [name], female   DOB: 01-Feb-1965, 52 y.o.   MRN: [id number]          Reason for Appointment: for Type 2 Diabetes  Referring physician: Emi Belfast     History of Present Illness:          Date of diagnosis of type 2 diabetes mellitus: 2014        Background history:    She apparently was asymptomatic at the time of diagnosis She was started on metformin but no details of initial management or A1c are available She thinks about 3 years ago she was started on Farxiga also with improved control and no side effects with this She has mostly been followed by a PCP but may have seen an endocrinologist also About 1-1/2-2 years ago she was also given basal insulin with Tyler Aas presumably for higher sugars but not clear what her A1c was at that time  Recent history:   INSULIN regimen is:  None, previously on Tresiba  Non-insulin hypoglycemic drugs the patient is taking are: Jardiance 25 mg daily, metformin 850 mg twice daily  Current management, blood sugar patterns and problems identified:  She did not follow-up after initial consultation in July  Since she ran out of her insulin at least a month ago he did not refill it because of cost, was recommended Toujeo but she thinks this was too expensive also  Surprisingly her blood sugars are still not going up without insulin over the last month  However did not bring any device for downloading her glucose records  She did try to get the freestyle libre but stopped using it because of the cost  Also on her last visit she was given the Accu-Chek meter but she is not using this consistently also  On her last visit was told to improve her diet with cutting back on soft drinks, fast food, milk and high calorie foods  She thinks she is having some support for diabetes management at work  Also she tries to walk twice a day at work about 15 minutes  Currently no side effects from [place] or metformin  Also  recent renal function is normal        Side effects from medications have been: Diarrhea from metformin  Compliance with the medical regimen:  Fair Hypoglycemia: none   Glucose monitoring:  done  1 times a day         Glucometer: none      Blood Glucose readings by recall   PREMEAL Breakfast Lunch Dinner Bedtime  Overall   Glucose range: 118-124      Median:        POST-MEAL PC Breakfast PC Lunch PC Dinner  Glucose range:  130 140  Median:      Self-care: The diet that the patient has been following is: tries to limit fried food.       Typical meal intake: Breakfast is occasionally fast food or Glucerna, sometimes none               Dietician visit, most recent: None               Exercise: none, does some walking at work although low programmed exercise  Weight history:  Wt Readings from Last 3 Encounters:  12/14/17 138 lb 12.8 oz (63 kg)  08/23/17 138 lb (62.6 kg)  08/06/17 136 lb 6.4 oz (61.9 kg)    Glycemic control:   Lab Results  Component  Value Date   HGBA1C 7.3 (A) 08/06/2017   HGBA1C 7.6 (H) 01/25/2017   HGBA1C 7.9 11/28/2016   Lab Results  Component Value Date   MICROALBUR 24.8 (H) 12/10/2017   LDLCALC 115 05/01/2015   CREATININE 0.72 12/10/2017   Lab Results  Component Value Date   MICRALBCREAT 6.5 12/10/2017    Lab Results  Component Value Date   FRUCTOSAMINE 239 12/10/2017      Allergies as of 12/14/2017      Reactions   Almond Meal Anaphylaxis, Swelling   Aspirin Nausea Only      Medication List        Accurate as of 12/14/17  9:32 AM. Always use your most recent med list.          albuterol 108 (90 Base) MCG/ACT inhaler Commonly known as:  PROVENTIL HFA;VENTOLIN HFA Inhale 2 puffs into the lungs every 4 (four) hours as needed for wheezing or shortness of breath.   allopurinol 100 MG tablet Commonly known as:  ZYLOPRIM TAKE ONE TABLET BY MOUTH DAILY. START ON 04/28/2017.   colchicine 0.6 MG tablet Take 1 tablet (0.6 mg  total) by mouth daily.   empagliflozin 25 MG Tabs tablet Commonly known as:  JARDIANCE Take 25 mg by mouth daily.   EPINEPHrine 0.3 mg/0.3 mL Soaj injection Commonly known as:  EPI-PEN Insert into muscle once with any facial or neck swelling  Instruct in use   FLUoxetine 40 MG capsule Commonly known as:  PROZAC TAKE 1 CAPSULE BY MOUTH DAILY.   FREESTYLE LIBRE 14 DAY SENSOR Misc 1 Units by Does not apply route every 14 (fourteen) days.   glucose blood test strip Use as instructed to monitor FSBS 3x daily. Dx: E11.65   Lancets Misc Use as instructed to monitor FSBS 3x daily. Dx: E11.65   metFORMIN 850 MG tablet Commonly known as:  GLUCOPHAGE Take 1 tablet (850 mg total) by mouth 2 (two) times daily with a meal.   ONE-A-DAY WOMENS VITACRAVES Chew Chew 1 each by mouth daily.   pantoprazole 40 MG tablet Commonly known as:  PROTONIX TAKE 1 TABLET (40 MG TOTAL) BY MOUTH DAILY.   SUMAtriptan 100 MG tablet Commonly known as:  IMITREX Take 1 tablet (100 mg total) by mouth every 2 (two) hours as needed for migraine. May repeat in 2 hours if headache persists or recurs.   valsartan-hydrochlorothiazide 320-25 MG tablet Commonly known as:  DIOVAN-HCT TAKE 1 TABLET BY MOUTH EVERY DAY   zolpidem 10 MG tablet Commonly known as:  AMBIEN Take 1 tablet (10 mg total) by mouth at bedtime as needed for sleep.       Allergies:  Allergies  Allergen Reactions  . Almond Meal Anaphylaxis and Swelling  . Aspirin Nausea Only    Past Medical History:  Diagnosis Date  . Adrenal adenoma    as per MRI 03/09/14  . Anxiety   . Diabetes mellitus   . Diverticulosis   . Elevated LFTs    elevated since 09/2013  . Fatty liver    moderate- as per 03/06/17 MRI liver  . GERD (gastroesophageal reflux disease)   . Gout   . H/O hiatal hernia   . Headache(784.0)   . History of depression   . Hyperlipemia   . Hyperplastic colon polyp   . Hypertension   . Positive hepatitis C antibody test     RNA NEGATIVE  . Thyroid nodule  managed Dr. Posey Pronto 02/27/2014  . Tobacco abuse   . Tubular  adenoma of colon     Past Surgical History:  Procedure Laterality Date  . ANAL FISTULECTOMY  01/19/1985  . BREAST EXCISIONAL BIOPSY Left   . BREAST LUMPECTOMY Left 1988  . PAROTIDECTOMY  03/30/2011   Procedure: PAROTIDECTOMY;  Surgeon: Izora Gala, MD;  Location: [facility];  Service: ENT;  Laterality: Right;  . WRIST GANGLION EXCISION Left 1988    Family History  Problem Relation Age of Onset  . Coronary artery disease Father 69  . Hypertension Mother   . Diabetes Sister   . Cancer Sister 101       uterine cancer  . Breast cancer Neg Hx     Social History:  reports that she has been smoking cigarettes. She has a 28.00 pack-year smoking history. She has never used smokeless tobacco. She reports that she drinks about 14.0 standard drinks of alcohol per week. She reports that she does not use drugs.   Review of Systems  Has had a right adrenal adenoma diagnosed initially in 02/2014 and stable on further imaging subsequently  Lipid history: Managed by PCP with atorvastatin 20 mg Most recent LDL 44 done in 03/2017    Lab Results  Component Value Date   CHOL 216 (H) 05/01/2015   HDL 42 (L) 05/01/2015   LDLCALC 115 05/01/2015   TRIG 293 (H) 05/01/2015   CHOLHDL 5.1 (H) 05/01/2015           Hypertension: This has been present for several years, medications include valsartan HCT Has been managed by PCP Does not have an upcoming visit until February  Also now her potassium is low not clear if this has been present before  BP Readings from Last 3 Encounters:  12/14/17 (!) 150/88  08/23/17 (!) 148/86  08/06/17 106/68   Lab Results  Component Value Date   K 3.0 (L) 12/10/2017    Most recent eye exam was in 2018  Most recent foot exam:7/19    LABS:  Office Visit on 12/14/2017  Component Date Value Ref Range Status  . POC Glucose 12/14/2017 137* 70 - 99 mg/dl  Final  Lab on 12/10/2017  Component Date Value Ref Range Status  . Microalb, Ur 12/10/2017 24.8* 0.0 - 1.9 mg/dL Final  . Creatinine,U 12/10/2017 381.4  mg/dL Final  . Microalb Creat Ratio 12/10/2017 6.5  0.0 - 30.0 mg/g Final  . Fructosamine 12/10/2017 239  0 - 285 umol/L Final   Comment: Published reference interval for apparently healthy subjects between age 22 and 43 is 58 - 285 umol/L and in a poorly controlled diabetic population is 228 - 563 umol/L with a mean of 396 umol/L.   Marland Kitchen Sodium 12/10/2017 140  135 - 145 mEq/L Final  . Potassium 12/10/2017 3.0* 3.5 - 5.1 mEq/L Final  . Chloride 12/10/2017 100  96 - 112 mEq/L Final  . CO2 12/10/2017 31  19 - 32 mEq/L Final  . Glucose, Bld 12/10/2017 143* 70 - 99 mg/dL Final  . BUN 12/10/2017 16  6 - 23 mg/dL Final  . Creatinine, Ser 12/10/2017 0.72  0.40 - 1.20 mg/dL Final  . Calcium 12/10/2017 10.0  8.4 - 10.5 mg/dL Final  . GFR 12/10/2017 108.88  >60.00 mL/min Final  . Glucose 12/10/2017 134* 65 - 99 mg/dL Final  . BUN 12/10/2017 15  6 - 24 mg/dL Final  . Creatinine, Ser 12/10/2017 0.68  0.57 - 1.00 mg/dL Final  . GFR calc non Af Amer 12/10/2017 100  >59 mL/min/1.73 Final  .  GFR calc Af Amer 12/10/2017 115  >59 mL/min/1.73 Final  . BUN/Creatinine Ratio 12/10/2017 22  9 - 23 Final  . Sodium 12/10/2017 140  134 - 144 mmol/L Final  . Potassium 12/10/2017 3.4* 3.5 - 5.2 mmol/L Final  . Chloride 12/10/2017 97  96 - 106 mmol/L Final  . CO2 12/10/2017 26  20 - 29 mmol/L Final  . Calcium 12/10/2017 9.9  8.7 - 10.2 mg/dL Final    Physical Examination:  BP (!) 150/88 (BP Location: Left Arm, Patient Position: Sitting, Cuff Size: Normal)   Pulse 88   Ht 5\' 3"  (1.6 m)   Wt 138 lb 12.8 oz (63 kg)   SpO2 98%   BMI 24.59 kg/m        ASSESSMENT:  Diabetes type 2, on basal insulin, metformin and Jardiance  See history of present illness for detailed discussion of current diabetes management, blood sugar patterns and problems  identified  Currently patient has an A1c of 6.7 done at work although previously had persistently high A1c over 7%  Even though she has run out of insulin her fasting blood sugars are not any higher and overall control is better This likely to be from overall improvement in her diet compared to the last time and walking regularly She appears to be doing fairly well with only metformin and Jardiance However currently not monitoring blood sugar much   History of hypertension, adrenal adenoma, hyperlipidemia, depression, alcohol abuse, abnormal liver functions and hyperuricemia  HYPERTENSION: Blood pressure is high and she needs to follow-up with her PCP Since she has a low potassium from HCTZ will add spironolactone 25 mg daily and have her see her PCP within the next month  Urine microalbumin is normal  PLAN:    Restart monitoring blood sugar regularly with the Accu-Chek or freestyle libre which ever she can afford  Discussed blood sugar monitoring especially after meals  She will try to be consistent with diet and continue walking for exercise  Follow-up A1c on the next visit   If her potassium and blood pressure continue to be a problem may consider evaluation for her adrenal adenoma, currently however only on 2 blood pressure medicines and her blood pressure has not been consistently high in the past  There are no Patient Instructions on file for this visit.  Total visit time for evaluation and management of multiple problems and counseling =25 minutes   Elayne Snare 12/14/2017, 9:32 AM   Note: This office note was prepared with Dragon voice recognition system technology. Any transcriptional errors that result from this process are unintentional.

## 2017-12-28 ENCOUNTER — Other Ambulatory Visit: Payer: Self-pay | Admitting: Family Medicine

## 2017-12-29 ENCOUNTER — Other Ambulatory Visit: Payer: Self-pay | Admitting: Family Medicine

## 2018-01-04 ENCOUNTER — Ambulatory Visit
Admission: RE | Admit: 2018-01-04 | Discharge: 2018-01-04 | Disposition: A | Discharge status: Home / Self Care | Payer: Commercial Managed Care - PPO | Source: Ambulatory Visit | Attending: Internal Medicine | Admitting: Internal Medicine

## 2018-01-04 ENCOUNTER — Other Ambulatory Visit: Payer: Self-pay

## 2018-01-04 ENCOUNTER — Other Ambulatory Visit: Payer: Self-pay | Admitting: Internal Medicine

## 2018-01-04 ENCOUNTER — Telehealth: Payer: Self-pay | Admitting: Endocrinology

## 2018-01-04 DIAGNOSIS — Z720 Tobacco use: Secondary | ICD-10-CM

## 2018-01-04 DIAGNOSIS — R053 Chronic cough: Secondary | ICD-10-CM

## 2018-01-04 DIAGNOSIS — R05 Cough: Secondary | ICD-10-CM

## 2018-01-04 MED ORDER — FREESTYLE LIBRE 14 DAY SENSOR MISC: 1.0000 [IU] | 4 refills | Status: DC

## 2018-01-04 NOTE — Telephone Encounter (Signed)
MEDICATION: Continuous Blood Gluc Sensor (FREESTYLE LIBRE 14 DAY SENSOR) MISC  PHARMACY:  [id number] [place] [place] - [address]  IS THIS A 90 DAY SUPPLY :   IS PATIENT OUT OF MEDICATION:  IF NOT; HOW MUCH IS LEFT:  1 day  LAST APPOINTMENT DATE: @11 /26/2019  NEXT APPOINTMENT DATE:@2 /19/2020  DO WE HAVE YOUR PERMISSION TO LEAVE A DETAILED MESSAGE:  OTHER COMMENTS:    **Let patient know to contact pharmacy at the end of the day to make sure medication is ready. **  ** Please notify patient to allow 48-72 hours to process**  **Encourage patient to contact the pharmacy for refills or they can request refills through [facility]**

## 2018-01-04 NOTE — Telephone Encounter (Signed)
rx sent

## 2018-01-05 ENCOUNTER — Other Ambulatory Visit: Payer: Self-pay | Admitting: Internal Medicine

## 2018-01-05 DIAGNOSIS — Z720 Tobacco use: Secondary | ICD-10-CM

## 2018-01-12 ENCOUNTER — Other Ambulatory Visit: Payer: Self-pay | Admitting: Endocrinology

## 2018-01-13 ENCOUNTER — Telehealth: Payer: Self-pay | Admitting: Endocrinology

## 2018-01-13 NOTE — Telephone Encounter (Signed)
Spoke with Stacy [name] and went over the patients med list-spironolactone was added by Dr. [name] on 12/14/17- this has been resolved

## 2018-01-13 NOTE — Telephone Encounter (Signed)
Per [facility] [place] ph# [phone] called "Caller has a question about a prescription. Caller states the doctor gave the patient a prescription for a fluid pill and patient already has a blood pressure medication that is a fluid pill."

## 2018-01-13 NOTE — Telephone Encounter (Signed)
Spironolactone is to improve her blood pressure and reduce potassium loss from the other fluid pill

## 2018-01-13 NOTE — Telephone Encounter (Signed)
[facility] to discuss [facility] that was sent to them for the patient   spironolactone (ALDACTONE) 25 MG tablet   [phone]

## 2018-01-13 NOTE — Telephone Encounter (Signed)
Pharmacy Called and questioned whether the pt should be taking Spironolactone in addition to her Diovan HCT. Please advise.

## 2018-01-13 NOTE — Telephone Encounter (Signed)
Called pharmacy and provided clarification.

## 2018-01-24 ENCOUNTER — Encounter: Payer: Self-pay | Admitting: Endocrinology

## 2018-01-24 LAB — LIPID PANEL
Cholesterol: 201 — AB (ref 0–200)
HDL: 59 (ref 35–70)
LDL Cholesterol: 114
Triglycerides: 138 (ref 40–160)

## 2018-01-24 LAB — TSH: TSH: 0.7 (ref 0.41–5.90)

## 2018-01-24 LAB — VITAMIN B12: Vitamin B-12: 898

## 2018-01-24 LAB — HEMOGLOBIN A1C: Hemoglobin A1C: 7.2

## 2018-01-24 LAB — CBC AND DIFFERENTIAL: HEMOGLOBIN: 14.2 (ref 12.0–16.0)

## 2018-01-24 LAB — BASIC METABOLIC PANEL: CREATININE: 1 (ref 0.5–1.1)

## 2018-01-26 ENCOUNTER — Other Ambulatory Visit: Payer: Self-pay

## 2018-01-29 ENCOUNTER — Other Ambulatory Visit: Payer: Self-pay | Admitting: Endocrinology

## 2018-01-31 ENCOUNTER — Other Ambulatory Visit: Payer: Self-pay

## 2018-01-31 MED ORDER — SPIRONOLACTONE 25 MG PO TABS: 25.0000 mg | ORAL_TABLET | Freq: Every day | ORAL | 4 refills | Status: DC

## 2018-02-10 ENCOUNTER — Other Ambulatory Visit: Payer: Self-pay | Admitting: Internal Medicine

## 2018-02-10 DIAGNOSIS — Z720 Tobacco use: Secondary | ICD-10-CM

## 2018-02-10 DIAGNOSIS — R634 Abnormal weight loss: Secondary | ICD-10-CM

## 2018-02-16 ENCOUNTER — Ambulatory Visit
Admission: RE | Admit: 2018-02-16 | Discharge: 2018-02-16 | Disposition: A | Discharge status: Home / Self Care | Payer: Commercial Managed Care - PPO | Source: Ambulatory Visit | Attending: Internal Medicine | Admitting: Internal Medicine

## 2018-02-16 DIAGNOSIS — Z720 Tobacco use: Secondary | ICD-10-CM

## 2018-02-16 DIAGNOSIS — R634 Abnormal weight loss: Secondary | ICD-10-CM

## 2018-02-16 MED ORDER — IOPAMIDOL (ISOVUE-300) INJECTION 61%
100.0000 mL | Freq: Once | INTRAVENOUS | Status: AC | PRN
  Administered 2018-02-16: 100 mL via INTRAVENOUS

## 2018-02-28 ENCOUNTER — Telehealth (INDEPENDENT_AMBULATORY_CARE_PROVIDER_SITE_OTHER): Payer: Self-pay | Admitting: Radiology

## 2018-02-28 NOTE — Telephone Encounter (Signed)
Per Dr. Lorin Mercy, complete out of work note per patient request. Per patient, out Feb 5, 6, 7 for flu like symptoms. Out again on Feb 10 for gout flare. OK to resume work Feb 11. Patient would like emailed to [email or web address].  Note emailed.

## 2018-03-06 ENCOUNTER — Other Ambulatory Visit: Payer: Self-pay | Admitting: Endocrinology

## 2018-03-06 DIAGNOSIS — Z794 Long term (current) use of insulin: Principal | ICD-10-CM

## 2018-03-06 DIAGNOSIS — E119 Type 2 diabetes mellitus without complications: Secondary | ICD-10-CM

## 2018-03-08 ENCOUNTER — Encounter: Payer: Self-pay | Admitting: Internal Medicine

## 2018-03-09 ENCOUNTER — Other Ambulatory Visit: Payer: Commercial Managed Care - PPO

## 2018-03-11 ENCOUNTER — Other Ambulatory Visit (INDEPENDENT_AMBULATORY_CARE_PROVIDER_SITE_OTHER): Payer: Commercial Managed Care - PPO

## 2018-03-11 DIAGNOSIS — E119 Type 2 diabetes mellitus without complications: Secondary | ICD-10-CM | POA: Diagnosis not present

## 2018-03-11 DIAGNOSIS — Z794 Long term (current) use of insulin: Secondary | ICD-10-CM

## 2018-03-11 LAB — COMPREHENSIVE METABOLIC PANEL
ALT: 48 U/L — ABNORMAL HIGH (ref 0–35)
AST: 38 U/L — ABNORMAL HIGH (ref 0–37)
Albumin: 4.4 g/dL (ref 3.5–5.2)
Alkaline Phosphatase: 81 U/L (ref 39–117)
BUN: 17 mg/dL (ref 6–23)
CO2: 28 mEq/L (ref 19–32)
Calcium: 10.1 mg/dL (ref 8.4–10.5)
Chloride: 99 mEq/L (ref 96–112)
Creatinine, Ser: 0.76 mg/dL (ref 0.40–1.20)
GFR: 96.15 mL/min (ref >60.00)
Glucose, Bld: 176 mg/dL — ABNORMAL HIGH (ref 70–99)
POTASSIUM: 3.6 meq/L (ref 3.5–5.1)
Sodium: 137 mEq/L (ref 135–145)
Total Bilirubin: 0.3 mg/dL (ref 0.2–1.2)
Total Protein: 7.8 g/dL (ref 6.0–8.3)

## 2018-03-12 LAB — FRUCTOSAMINE: Fructosamine: 248 umol/L (ref 0–285)

## 2018-03-16 ENCOUNTER — Encounter: Payer: Self-pay | Admitting: Endocrinology

## 2018-03-16 ENCOUNTER — Ambulatory Visit: Payer: Commercial Managed Care - PPO | Admitting: Endocrinology

## 2018-03-31 ENCOUNTER — Other Ambulatory Visit: Payer: Self-pay

## 2018-03-31 ENCOUNTER — Ambulatory Visit (INDEPENDENT_AMBULATORY_CARE_PROVIDER_SITE_OTHER): Payer: Commercial Managed Care - PPO | Admitting: Endocrinology

## 2018-03-31 ENCOUNTER — Encounter: Payer: Self-pay | Admitting: Endocrinology

## 2018-03-31 VITALS — BP 140/90 | HR 87 | Ht 63.0 in | Wt 134.6 lb

## 2018-03-31 DIAGNOSIS — E876 Hypokalemia: Secondary | ICD-10-CM

## 2018-03-31 DIAGNOSIS — R748 Abnormal levels of other serum enzymes: Secondary | ICD-10-CM

## 2018-03-31 DIAGNOSIS — E1165 Type 2 diabetes mellitus with hyperglycemia: Secondary | ICD-10-CM | POA: Diagnosis not present

## 2018-03-31 MED ORDER — SPIRONOLACTONE 50 MG PO TABS: 50.0000 mg | ORAL_TABLET | Freq: Every day | ORAL | 2 refills | Status: DC

## 2018-03-31 MED ORDER — FREESTYLE LIBRE 14 DAY SENSOR MISC: 1.0000 [IU] | 4 refills | Status: DC

## 2018-03-31 NOTE — Patient Instructions (Addendum)
Check blood sugars on waking up days a week  Also check blood sugars about 2 hours after meals and do this after different meals by rotation  Recommended blood sugar levels on waking up are 90-130 and about 2 hours after meal is 130-160  Please bring your blood sugar monitor to each visit, thank you  Take 50 Spironolactone

## 2018-03-31 NOTE — Progress Notes (Signed)
Patient ID: Stacy [name], female   DOB: 03/23/1965, 53 y.o.   MRN: [id number]          Reason for Appointment: Follow-up for Type 2 Diabetes  Referring physician: Emi Belfast     History of Present Illness:          Date of diagnosis of type 2 diabetes mellitus: 2014        Background history:    She apparently was asymptomatic at the time of diagnosis She was started on metformin but no details of initial management or A1c are available She thinks about 3 years ago she was started on Farxiga also with improved control and no side effects with this She has mostly been followed by a PCP but may have seen an endocrinologist also About 1-1/2-2 years ago she was also given basal insulin with Tyler Aas presumably for higher sugars but not clear what her A1c was at that time  Recent history:    Her A1c is 7.2 in January compared to 6.7 previously Fructosamine is 249  Non-insulin hypoglycemic drugs the patient is taking are: Jardiance 25 mg daily, metformin 850 mg twice daily  Current management, blood sugar patterns and problems identified:  She has not checked her sugars much and did not bring her monitor  She said that she prefers a freestyle libre but she stopped using it because of a local reaction in the last time she tried it  Although she does not think her blood sugars are consistently high after meals not clear how often she monitors these  Postprandial reading in the lab was 176  Also she thinks her fasting readings are relatively higher at 140  She is still not finding the time or motivation to exercise  Her weight is slightly lower        Side effects from medications have been: Diarrhea from metformin  Compliance with the medical regimen:  Fair Hypoglycemia: none   Glucose monitoring:  done  1 times a day         Glucometer:  Accu-Chek      Blood Glucose readings by recall  About 140 fasting and 170 pc  Self-care: The diet that the patient has been  following is: tries to limit fried food.       Typical meal intake: Breakfast is occasionally fast food or Glucerna, sometimes none               Dietician visit, most recent: None               Exercise: none, does some walking at work although low programmed exercise  Weight history:  Wt Readings from Last 3 Encounters:  03/31/18 134 lb 9.6 oz (61.1 kg)  12/14/17 138 lb 12.8 oz (63 kg)  08/23/17 138 lb (62.6 kg)    Glycemic control:   Lab Results  Component Value Date   HGBA1C 7.2 01/24/2018   HGBA1C 6.7 (A) 11/02/2017   HGBA1C 7.3 (A) 08/06/2017   Lab Results  Component Value Date   MICROALBUR 24.8 (H) 12/10/2017   LDLCALC 114 01/24/2018   CREATININE 0.76 03/11/2018   Lab Results  Component Value Date   MICRALBCREAT 6.5 12/10/2017    Lab Results  Component Value Date   FRUCTOSAMINE 248 03/11/2018   FRUCTOSAMINE 239 12/10/2017      Allergies as of 03/31/2018      Reactions   Almond Meal Anaphylaxis, Swelling   Aspirin Nausea Only  Medication List       Accurate as of March 31, 2018 11:59 PM. Always use your most recent med list.        albuterol 108 (90 Base) MCG/ACT inhaler Commonly known as:  PROVENTIL HFA;VENTOLIN HFA Inhale 2 puffs into the lungs every 4 (four) hours as needed for wheezing or shortness of breath.   allopurinol 100 MG tablet Commonly known as:  ZYLOPRIM TAKE ONE TABLET BY MOUTH DAILY. START ON 04/28/2017.   colchicine 0.6 MG tablet Take 1 tablet (0.6 mg total) by mouth daily.   empagliflozin 25 MG Tabs tablet Commonly known as:  JARDIANCE Take 25 mg by mouth daily.   EPINEPHrine 0.3 mg/0.3 mL Soaj injection Commonly known as:  EPI-PEN Insert into muscle once with any facial or neck swelling  Instruct in use   FLUoxetine 40 MG capsule Commonly known as:  PROZAC TAKE 1 CAPSULE BY MOUTH DAILY.   FreeStyle Libre 14 Day Sensor Misc 1 Units by Does not apply route every 14 (fourteen) days.   glucose blood test strip  Use as instructed to monitor FSBS 3x daily. Dx: E11.65   Lancets Misc Use as instructed to monitor FSBS 3x daily. Dx: E11.65   metFORMIN 850 MG tablet Commonly known as:  Glucophage Take 1 tablet (850 mg total) by mouth 2 (two) times daily with a meal.   One-A-Day [employer]om[employer] 1 each by mouth daily.   pantoprazole 40 MG tablet Commonly known as:  PROTONIX TAKE 1 TABLET BY MOUTH EVERY DAY   spironolactone 50 MG tablet Commonly known as:  ALDACTONE Take 1 tablet (50 mg total) by mouth daily.   SUMAtriptan 100 MG tablet Commonly known as:  Imitrex Take 1 tablet (100 mg total) by mouth every 2 (two) hours as needed for migraine. May repeat in 2 hours if headache persists or recurs.   valsartan-hydrochlorothiazide 320-25 MG tablet Commonly known as:  DIOVAN-HCT TAKE 1 TABLET BY MOUTH EVERY DAY   zolpidem 10 MG tablet Commonly known as:  Ambien Take 1 tablet (10 mg total) by mouth at bedtime as needed for sleep.       Allergies:  Allergies  Allergen Reactions  . Almond Meal Anaphylaxis and Swelling  . Aspirin Nausea Only    Past Medical History:  Diagnosis Date  . Adrenal adenoma    as per MRI 03/09/14  . Anxiety   . Diabetes mellitus   . Diverticulosis   . Elevated LFTs    elevated since 09/2013  . Fatty liver    moderate- as per 03/06/17 MRI liver  . GERD (gastroesophageal reflux disease)   . Gout   . H/O hiatal hernia   . Headache(784.0)   . History of depression   . Hyperlipemia   . Hyperplastic colon polyp   . Hypertension   . Positive hepatitis C antibody test    RNA NEGATIVE  . Thyroid nodule  managed Dr. Posey Pronto 02/27/2014  . Tobacco abuse   . Tubular adenoma of colon     Past Surgical History:  Procedure Laterality Date  . ANAL FISTULECTOMY  01/19/1985  . BREAST EXCISIONAL BIOPSY Left   . BREAST LUMPECTOMY Left 1988  . PAROTIDECTOMY  03/30/2011   Procedure: PAROTIDECTOMY;  Surgeon: Izora Gala, MD;  Location: [place];  Service: ENT;  Laterality: Right;  . WRIST GANGLION EXCISION Left 1988    Family History  Problem Relation Age of Onset  . Coronary artery disease Father 10  .  Hypertension Mother   . Diabetes Sister   . Cancer Sister 59       uterine cancer  . Breast cancer Neg Hx     Social History:  reports that she has been smoking cigarettes. She has a 28.00 pack-year smoking history. She has never used smokeless tobacco. She reports current alcohol use of about 14.0 standard drinks of alcohol per week. She reports that she does not use drugs.   Review of Systems  Has had a right adrenal adenoma diagnosed initially in 02/2014 and stable on further imaging subsequently  Lipid history: Managed by PCP with atorvastatin 20 mg recently has been out of her medication    Lab Results  Component Value Date   CHOL 201 (A) 01/24/2018   HDL 59 01/24/2018   LDLCALC 114 01/24/2018   TRIG 138 01/24/2018   CHOLHDL 5.1 (H) 05/01/2015           Hypertension: This has been present for several years, medications include valsartan HCT Has been managed by PCP She thinks her blood pressure at work is about 140/85 average  Because of her hypokalemia she was told to start Spironolactone 25 daily and with this her potassium is back to normal  BP Readings from Last 3 Encounters:  03/31/18 140/90  12/14/17 (!) 150/88  08/23/17 (!) 148/86   Lab Results  Component Value Date   K 3.6 03/11/2018    Reportedly has fatty liver  Lab Results  Component Value Date   ALT 48 (H) 03/11/2018     Most recent foot exam:7/19    LABS:  No visits with results within 1 Week(s) from this visit.  Latest known visit with results is:  Lab on 03/11/2018  Component Date Value Ref Range Status  . Fructosamine 03/11/2018 248  0 - 285 umol/L Final   Comment: Published reference interval for apparently healthy subjects between age 54 and 12 is 61 - 285 umol/L and in a poorly controlled diabetic population  is 228 - 563 umol/L with a mean of 396 umol/L.   Marland Kitchen Sodium 03/11/2018 137  135 - 145 mEq/L Final  . Potassium 03/11/2018 3.6  3.5 - 5.1 mEq/L Final  . Chloride 03/11/2018 99  96 - 112 mEq/L Final  . CO2 03/11/2018 28  19 - 32 mEq/L Final  . Glucose, Bld 03/11/2018 176* 70 - 99 mg/dL Final  . BUN 03/11/2018 17  6 - 23 mg/dL Final  . Creatinine, Ser 03/11/2018 0.76  0.40 - 1.20 mg/dL Final  . Total Bilirubin 03/11/2018 0.3  0.2 - 1.2 mg/dL Final  . Alkaline Phosphatase 03/11/2018 81  39 - 117 U/L Final  . AST 03/11/2018 38* 0 - 37 U/L Final  . ALT 03/11/2018 48* 0 - 35 U/L Final  . Total Protein 03/11/2018 7.8  6.0 - 8.3 g/dL Final  . Albumin 03/11/2018 4.4  3.5 - 5.2 g/dL Final  . Calcium 03/11/2018 10.1  8.4 - 10.5 mg/dL Final  . GFR 03/11/2018 96.15  >60.00 mL/min Final    Physical Examination:  BP 140/90 (BP Location: Left Arm, Patient Position: Sitting, Cuff Size: Normal)   Pulse 87   Ht 5\' 3"  (1.6 m)   Wt 134 lb 9.6 oz (61.1 kg)   SpO2 96%   BMI 23.84 kg/m        ASSESSMENT:  Diabetes type 2, on  metformin and Jardiance  See history of present illness for detailed discussion of current diabetes management, blood sugar patterns and  problems identified  Currently patient has an A1c of 6.7 done at work although previously had persistently high A1c over 7%  Even though she has run out of insulin her fasting blood sugars are not any higher and overall control is better This likely to be from overall improvement in her diet compared to the last time and walking regularly She appears to be doing fairly well with only metformin and Jardiance However currently not monitoring blood sugar much  She needs to be checking her blood sugars more consistently and she can try to use the freestyle libre again Discussed blood sugar targets at various times Although she is reluctant to take insulin she would benefit from a medication that will help postprandial readings also For now  since blood sugars are only mildly increased she can benefit from Amaryl 1 mg at dinnertime Otherwise is a candidate for a GLP-1 drug such as Rybelsus  History of hypertension, adrenal adenoma, hyperlipidemia, depression, alcohol abuse, abnormal liver functions and hyperuricemia  HYPERTENSION: Blood pressure is still relatively high May be somewhat better from adding spironolactone and her potassium is back to normal She will benefit from increasing her spironolactone to 50 mg  LIVER function abnormality: She will discuss with PCP, reportedly has fatty liver and the enzymes are relatively higher now  PLAN:   Restart monitoring blood See above New prescriptions for Amaryl, Aldactone and freestyle libre sensor sent Check A1c in about 2 months  Total visit time for evaluation and management of multiple problems and counseling =25 minutes  Patient Instructions  Check blood sugars on waking up days a week  Also check blood sugars about 2 hours after meals and do this after different meals by rotation  Recommended blood sugar levels on waking up are 90-130 and about 2 hours after meal is 130-160  Please bring your blood sugar monitor to each visit, thank you  Take 50 Spironolactone       Evgenia Merriman 04/01/2018, 10:29 AM   Note: This office note was prepared with Dragon voice recognition system technology. Any transcriptional errors that result from this process are unintentional.

## 2018-04-01 MED ORDER — GLIMEPIRIDE 1 MG PO TABS: 1.0000 mg | ORAL_TABLET | Freq: Every day | ORAL | 2 refills | Status: DC

## 2018-04-08 ENCOUNTER — Telehealth: Payer: Self-pay | Admitting: *Deleted

## 2018-04-08 NOTE — Telephone Encounter (Signed)
Left message to call back regarding appointment with Dr Oval Linsey 3/24

## 2018-04-12 ENCOUNTER — Other Ambulatory Visit: Payer: Self-pay

## 2018-04-12 ENCOUNTER — Telehealth (INDEPENDENT_AMBULATORY_CARE_PROVIDER_SITE_OTHER): Payer: Commercial Managed Care - PPO | Admitting: Cardiovascular Disease

## 2018-04-12 ENCOUNTER — Encounter: Payer: Self-pay | Admitting: Cardiovascular Disease

## 2018-04-12 VITALS — BP 145/80

## 2018-04-12 DIAGNOSIS — I10 Essential (primary) hypertension: Secondary | ICD-10-CM | POA: Diagnosis not present

## 2018-04-12 DIAGNOSIS — Z5181 Encounter for therapeutic drug level monitoring: Secondary | ICD-10-CM | POA: Diagnosis not present

## 2018-04-12 DIAGNOSIS — I2510 Atherosclerotic heart disease of native coronary artery without angina pectoris: Secondary | ICD-10-CM | POA: Diagnosis not present

## 2018-04-12 DIAGNOSIS — F17210 Nicotine dependence, cigarettes, uncomplicated: Secondary | ICD-10-CM

## 2018-04-12 HISTORY — DX: Atherosclerotic heart disease of native coronary artery without angina pectoris: I25.10

## 2018-04-12 MED ORDER — VARENICLINE TARTRATE 0.5 MG X 11 & 1 MG X 42 PO MISC: ORAL | 0 refills | Status: DC

## 2018-04-12 MED ORDER — HYDROCHLOROTHIAZIDE 25 MG PO TABS: 25.0000 mg | ORAL_TABLET | Freq: Every day | ORAL | 5 refills | Status: DC

## 2018-04-12 MED ORDER — VARENICLINE TARTRATE 1 MG PO TABS: 1.0000 mg | ORAL_TABLET | Freq: Two times a day (BID) | ORAL | 2 refills | Status: DC

## 2018-04-12 NOTE — Patient Instructions (Addendum)
Medication Instructions:  INCREASE YOUR HYDROCHLOROTHIAZIDE TO 25 MG DAILY   START CHANTIX Take one 0.5 mg tablet by mouth once daily for 3 days, then increase to one 0.5 mg tablet twice daily for 4 days, then increase to one 1 mg tablet twice daily.  If you need a refill on your cardiac medications before your next appointment, please call your pharmacy.   Lab work: BMET IN 1 WEEK   If you have labs (blood work) drawn today and your tests are completely normal, you will receive your results only by: Marland Kitchen MyChart Message (if you have MyChart) OR . A paper copy in the mail If you have any lab test that is abnormal or we need to change your treatment, we will call you to review the results.  Testing/Procedures: NONE  Follow-Up: Your physician recommends that you schedule a follow-up appointment in: [place] physician wants you to follow-up in: [place]  You will receive a reminder letter in the mail two months in advance. If you don't receive a letter, please call our office to schedule the follow-up appointment.  Any Other Special Instructions Will Be Listed Below (If Applicable). TRY TO EXERCISE 150 MINUTES A WEEK

## 2018-04-12 NOTE — Progress Notes (Signed)
Tele-Health Visit     Evaluation Performed:  Follow-up visit  This visit type was conducted due to national recommendations for restrictions regarding the COVID-19 Pandemic (e.g. social distancing).  This format is felt to be most appropriate for this patient at this time.  All issues noted in this document were discussed and addressed.  No physical exam was performed (except for noted visual exam findings with Telehealth visits).  See MyChart message from today for the patient's consent to telehealth for [facility].  Date:  04/12/2018   ID:  Stacy [name], DOB 1965/01/23, MRN [id number]  Patient Location:  [place] [address]   Provider location:   [facility]  PCP:  Lanice Shirts, MD  Cardiologist:  Skeet Latch, MD    Chief Complaint:  Atherosclerosis on CT  History of Present Illness:    Stacy [name] is a 53 y.o. female with hypertension, diabetes, fatty liver disease, elevated LFTs, and tobacco abuse who presents via audio/video conferencing for a telehealth visit today.  Stacy [name] saw Dr. Coralyn Mark and was referred for a CT of the chest, abdomen, and pelvis for unintentional weight loss and tobacco abuse.  On that CT she was noted to have mild to moderate calcified plaque in the thoracic aorta and left anterior descending coronary artery calcification.  Therefore she was referred to cardiology for further evaluation.  [name] has been feeling well.  She does not get much formal exercise but is active.  She does a lot of walking at work and walks up and down the stairs.  She has no exertional pain or shortness of breath.  She denies any lower extremity edema, orthopnea, or PND.  She reports that she does not exercise due to "laziness."  She has been feeling very stressed at work.  She works for the [employer] and they are responding to the COVID-19 outbreak.  She has not been eating well.  Her husband  died unexpectedly of a heart attack a few years ago.  He was the main cook.  She has limited appetite and does not enjoy cooking so she typically eats 1 large meal daily.  She attributes her weight loss to this decrease in food intake.   Two years ago her mother was diagnosed Guillian-Barr syndrome and has been on a ventilator since that time. [name] has been smoking for 25 years and smokes less than a pack of cigarettes daily.  She does want to quit but knows that her stress test to be better controlled for her to be successful with smoking cessation.  She checks her blood pressure at work and it typically is in the 140s over 80s.  Today it was 145/80.     The patient does not symptoms concerning for COVID-19 infection (fever, chills, cough, or new SHORTNESS OF BREATH).    Prior CV studies:   The following studies were reviewed today: N/a  Past Medical History:  Diagnosis Date   Adrenal adenoma    as per MRI 03/09/14   Anxiety    Coronary artery calcification seen on CAT scan 04/12/2018   Diabetes mellitus    Diverticulosis    Elevated LFTs    elevated since 09/2013   Fatty liver    moderate- as per 03/06/17 MRI liver   GERD (gastroesophageal reflux disease)    Gout    H/O hiatal hernia    Headache(784.0)    History of depression  Hyperlipemia    Hyperplastic colon polyp    Hypertension    Positive hepatitis C antibody test    RNA NEGATIVE   Thyroid nodule  managed Dr. Posey Pronto 02/27/2014   Tobacco abuse    Tubular adenoma of colon    Past Surgical History:  Procedure Laterality Date   ANAL FISTULECTOMY  01/19/1985   BREAST EXCISIONAL BIOPSY Left    BREAST LUMPECTOMY Left 1988   PAROTIDECTOMY  03/30/2011   Procedure: PAROTIDECTOMY;  Surgeon: Izora Gala, MD;  Location: [place];  Service: ENT;  Laterality: Right;   WRIST GANGLION EXCISION Left 1988     Current Meds  Medication Sig   albuterol (PROVENTIL HFA;VENTOLIN HFA) 108 (90  Base) MCG/ACT inhaler Inhale 2 puffs into the lungs every 4 (four) hours as needed for wheezing or shortness of breath.   atorvastatin (LIPITOR) 20 MG tablet Take 20 mg by mouth daily.   Continuous Blood Gluc Sensor (FREESTYLE LIBRE 14 DAY SENSOR) MISC 1 Units by Does not apply route every 14 (fourteen) days.   FLUoxetine (PROZAC) 40 MG capsule TAKE 1 CAPSULE BY MOUTH DAILY.   hydrochlorothiazide (HYDRODIURIL) 25 MG tablet Take 25 mg by mouth daily.   pantoprazole (PROTONIX) 40 MG tablet TAKE 1 TABLET BY MOUTH EVERY DAY   spironolactone (ALDACTONE) 25 MG tablet Take 25 mg by mouth daily.     Allergies:   Almond meal and Aspirin   Social History   Tobacco Use   Smoking status: Current Every Day Smoker    Packs/day: 1.00    Years: 28.00    Pack years: 28.00    Types: Cigarettes   Smokeless tobacco: Never Used  Substance Use Topics   Alcohol use: Yes    Alcohol/week: 14.0 standard drinks    Types: 14 Standard drinks or equivalent per week   Drug use: No     Family Hx: The patient's family history includes Aneurysm in her sister; Blindness in her paternal grandmother; Cancer (age of onset: 67) in her sister; Coronary artery disease (age of onset: 60) in her father; Diabetes in her sister; Heart attack in her maternal grandmother; Hypertension in her mother; Stroke in her mother. There is no history of Breast cancer.  ROS:   Please see the history of present illness.     All other systems reviewed and are negative.   Labs/Other Tests and Data Reviewed:    Recent Labs: 05/19/2017: Platelets 347.0 01/24/2018: Hemoglobin 14.2; TSH 0.70 03/11/2018: ALT 48; BUN 17; Creatinine, Ser 0.76; Potassium 3.6; Sodium 137   Recent Lipid Panel Lab Results  Component Value Date/Time   CHOL 201 (A) 01/24/2018   TRIG 138 01/24/2018   HDL 59 01/24/2018   CHOLHDL 5.1 (H) 05/01/2015 10:09 AM   LDLCALC 114 01/24/2018    Wt Readings from Last 3 Encounters:  03/31/18 134 lb 9.6 oz (61.1  kg)  12/14/17 138 lb 12.8 oz (63 kg)  08/23/17 138 lb (62.6 kg)     Exam:    Vital Signs:  BP (!) 145/80    Well nourished, well developed female in no acute distress. VS:  BP (!) 145/80  , BMI There is no height or weight on file to calculate BMI. GENERAL:  Well appearing.  Occasional cough. HEENT: Pupils equal round.  Oral mucosa unremarkable NECK:  No jugular venous distention.  No obvious thyromegaly EXT:  No edema, no cyanosis no clubbing SKIN:  No rashes no nodules NEURO:  Cranial nerves II through XII  grossly intact.  Moves all four extremities freely PSYCH:  Cognitively intact, oriented to person place and time  ASSESSMENT & PLAN:    #Coronary and thoracic aorta #Pure hypercholesterolemia: Ms. Pigman has calcification of the thoracic aorta and her LAD.  She has no symptoms concerning for angina, making it very unlikely that she has obstructive coronary disease.  Therefore, we will focus on prevention.  She is not exercising, her diet is poor, and she is smoking.  She will work on increasing her exercise to 150 minutes per week.  She will also work on her diet to reduce her milk intake.  Her LDL was 114 on 01/2018.  The goal is for it to be <70.  She will work on diet, exercise, and smoking cessation.     # Hypertension:  BP above goal.  We will increase HCTZ to 25mg  daily. Check BMP at work in 1 week. Work on diet and exercise as above.   # Tobacco abuse: Ms. Throckmorton is ready to try and quit smoking.  We will prescribe Chantix.  We discussed smoking cessation for 5 minutes.  COVID-19 Education: The signs and symptoms of COVID-19 were discussed with the patient and how to seek care for testing (follow up with PCP or arrange E-visit).  The importance of social distancing was discussed today.  Patient Risk:   After full review of this patients clinical status, I feel that they are at least moderate risk at this time.  Time:   Today, I have spent 26 minutes with the patient with  telehealth technology discussing abnormal CT findings, hypertension.     Medication Adjustments/Labs and Tests Ordered: Current medicines are reviewed at length with the patient today.  Concerns regarding medicines are outlined above.  Tests Ordered: Orders Placed This Encounter  Procedures   Basic metabolic panel   Medication Changes: Meds ordered this encounter  Medications   varenicline (CHANTIX STARTING MONTH PAK) 0.5 MG X 11 & 1 MG X 42 tablet    Sig: One 0.5 mg tablet by mouth once a day for 3 days, then one 0.5 mg tablet twice a day for 4 days, then  one 1 mg tablet twice a day    Dispense:  53 tablet    Refill:  0   varenicline (CHANTIX CONTINUING MONTH PAK) 1 MG tablet    Sig: Take 1 tablet (1 mg total) by mouth 2 (two) times daily. when you finish your starter pack    Dispense:  60 tablet    Refill:  2    When she finishes her starter pack    Disposition:  in 3 month(s)  Signed, Skeet Latch, MD  04/12/2018 3:04 PM    [facility] [place], [place], [place]  [address] Phone: [phone]; Fax: [phone]

## 2018-04-12 NOTE — Telephone Encounter (Signed)
Patient had video visit with Dr Oval Linsey today. Left message to call back to discuss AVS and Chantix

## 2018-05-25 NOTE — Telephone Encounter (Signed)
Yes 81 mg 

## 2018-05-25 NOTE — Telephone Encounter (Signed)
Called patient to follow up on messages and lab work. She will look for her lab order form and get labs. Advised if she does not find order to let me know so another can be maile  Patient wanted to know if she should be taking an ASA daily   Will forward to Dr Oval Linsey for review

## 2018-05-26 NOTE — Telephone Encounter (Signed)
Left message to call back  

## 2018-05-27 ENCOUNTER — Encounter: Payer: Self-pay | Admitting: Cardiovascular Disease

## 2018-05-27 NOTE — Telephone Encounter (Signed)
Patient is returning call to [facility]

## 2018-05-27 NOTE — Telephone Encounter (Signed)
Advised patient, verbalized understanding  

## 2018-05-27 NOTE — Telephone Encounter (Signed)
This encounter was created in error - please disregard.

## 2018-05-31 ENCOUNTER — Other Ambulatory Visit (INDEPENDENT_AMBULATORY_CARE_PROVIDER_SITE_OTHER): Payer: Commercial Managed Care - PPO

## 2018-05-31 ENCOUNTER — Other Ambulatory Visit: Payer: Self-pay

## 2018-05-31 DIAGNOSIS — E1165 Type 2 diabetes mellitus with hyperglycemia: Secondary | ICD-10-CM

## 2018-06-01 LAB — COMPREHENSIVE METABOLIC PANEL
ALT: 26 U/L (ref 0–35)
AST: 28 U/L (ref 0–37)
Albumin: 4.2 g/dL (ref 3.5–5.2)
Alkaline Phosphatase: 91 U/L (ref 39–117)
BUN: 25 mg/dL — ABNORMAL HIGH (ref 6–23)
CO2: 29 mEq/L (ref 19–32)
Calcium: 9.8 mg/dL (ref 8.4–10.5)
Chloride: 94 mEq/L — ABNORMAL LOW (ref 96–112)
Creatinine, Ser: 1.25 mg/dL — ABNORMAL HIGH (ref 0.40–1.20)
GFR: 54.1 mL/min — ABNORMAL LOW (ref >60.00)
Glucose, Bld: 148 mg/dL — ABNORMAL HIGH (ref 70–99)
Potassium: 3.8 mEq/L (ref 3.5–5.1)
Sodium: 135 mEq/L (ref 135–145)
Total Bilirubin: 0.3 mg/dL (ref 0.2–1.2)
Total Protein: 7.7 g/dL (ref 6.0–8.3)

## 2018-06-01 LAB — HEMOGLOBIN A1C: Hgb A1c MFr Bld: 7.9 % — ABNORMAL HIGH (ref 4.6–6.5)

## 2018-06-02 ENCOUNTER — Encounter: Payer: Self-pay | Admitting: Endocrinology

## 2018-06-02 ENCOUNTER — Encounter: Payer: Self-pay | Admitting: *Deleted

## 2018-06-02 ENCOUNTER — Other Ambulatory Visit: Payer: Self-pay | Admitting: *Deleted

## 2018-06-02 ENCOUNTER — Other Ambulatory Visit: Payer: Self-pay

## 2018-06-02 ENCOUNTER — Ambulatory Visit (INDEPENDENT_AMBULATORY_CARE_PROVIDER_SITE_OTHER): Payer: Commercial Managed Care - PPO | Admitting: Endocrinology

## 2018-06-02 DIAGNOSIS — E1165 Type 2 diabetes mellitus with hyperglycemia: Secondary | ICD-10-CM | POA: Diagnosis not present

## 2018-06-02 DIAGNOSIS — E876 Hypokalemia: Secondary | ICD-10-CM

## 2018-06-02 NOTE — Progress Notes (Signed)
Patient ID: Stacy [name], female   DOB: 05-Sep-1965, 53 y.o.   MRN: [id number]          Today's office visit was provided via telemedicine using video technique Explained to the patient and the the limitations of evaluation and management by telemedicine and the availability of in person appointments.  The patient understood the limitations and agreed to proceed. Patient also understood that the telehealth visit is billable. . Location of the patient: Home . Location of the provider: Office Only the patient and myself were participating in the encounter   Reason for Appointment: Follow-up for Type 2 Diabetes  Referring physician: Emi Belfast     History of Present Illness:          Date of diagnosis of type 2 diabetes mellitus: 2014        Background history:    She apparently was asymptomatic at the time of diagnosis She was started on metformin but no details of initial management or A1c are available She thinks about 3 years ago she was started on Farxiga also with improved control and no side effects with this She has mostly been followed by a PCP but may have seen an endocrinologist also About 1-1/2-2 years ago she was also given basal insulin with Tyler Aas presumably for higher sugars but not clear what her A1c was at that time  Recent history:    Her A1c is higher at 7.9, previously 7.2 in January   Non-insulin hypoglycemic drugs the patient is taking are: Jardiance 25 mg daily, metformin 850 mg twice daily, Amaryl 1 mg at dinner  Current management, blood sugar patterns and problems identified:  She has not taken her Amaryl prescription until about 2 days ago  She states that she had difficulty with insurance and not clear why she did not call about not getting this medication  As before her blood sugars are not consistently controlled  Currently checking blood sugars mostly in the mornings and not clear what her average blood sugar  On her last visit she  had not brought her blood sugar  She claims that she is losing weight and recent weight was 134 no apparent reason  She is however trying to walk a couple of times a day, about 15 minutes during the workday  She does not think she has changed her diet        Side effects from medications have been: Diarrhea from metformin  Compliance with the medical regimen:  Fair Hypoglycemia: none   Glucose monitoring:  done  1 times a day         Glucometer:  Accu-Chek      Blood Glucose readings by patient reporting  Fasting blood sugar range 147-201 for the last 7 readings   Self-care: The diet that the patient has been following is: tries to limit fried food.       Typical meal intake: Breakfast is occasionally fast food or Glucerna, sometimes none               Dietician visit, most recent: None  Weight history:  Wt Readings from Last 3 Encounters:  03/31/18 134 lb 9.6 oz (61.1 kg)  12/14/17 138 lb 12.8 oz (63 kg)  08/23/17 138 lb (62.6 kg)    Glycemic control:   Lab Results  Component Value Date   HGBA1C 7.9 (H) 05/31/2018   HGBA1C 7.2 01/24/2018   HGBA1C 6.7 (A) 11/02/2017   Lab Results  Component Value Date  MICROALBUR 24.8 (H) 12/10/2017   LDLCALC 114 01/24/2018   CREATININE 1.25 (H) 05/31/2018   Lab Results  Component Value Date   MICRALBCREAT 6.5 12/10/2017    Lab Results  Component Value Date   FRUCTOSAMINE 248 03/11/2018   FRUCTOSAMINE 239 12/10/2017      Allergies as of 06/02/2018      Reactions   Almond Meal Anaphylaxis, Swelling   Aspirin Nausea Only      Medication List       Accurate as of Jun 02, 2018 12:49 PM. If you have any questions, ask your nurse or doctor.        STOP taking these medications   aspirin EC 81 MG tablet Stopped by:  Elayne Snare, MD   FreeStyle Libre 14 Day Sensor Misc Stopped by:  Elayne Snare, MD   varenicline 0.5 MG X 11 & 1 MG X 42 tablet Commonly known as:  Chantix Starting Month Pak Stopped by:  Elayne Snare, MD   varenicline 1 MG tablet Commonly known as:  Chantix Continuing Month Pak Stopped by:  Elayne Snare, MD     TAKE these medications   albuterol 108 (90 Base) MCG/ACT inhaler Commonly known as:  VENTOLIN HFA Inhale 2 puffs into the lungs every 4 (four) hours as needed for wheezing or shortness of breath.   allopurinol 100 MG tablet Commonly known as:  ZYLOPRIM TAKE ONE TABLET BY MOUTH DAILY. START ON 04/28/2017.   atorvastatin 20 MG tablet Commonly known as:  LIPITOR Take 20 mg by mouth daily.   colchicine 0.6 MG tablet Take 1 tablet (0.6 mg total) by mouth daily.   empagliflozin 25 MG Tabs tablet Commonly known as:  JARDIANCE Take 25 mg by mouth daily.   EPINEPHrine 0.3 mg/0.3 mL Soaj injection Commonly known as:  EPI-PEN Insert into muscle once with any facial or neck swelling  Instruct in use   FLUoxetine 40 MG capsule Commonly known as:  PROZAC TAKE 1 CAPSULE BY MOUTH DAILY.   glimepiride 1 MG tablet Commonly known as:  Amaryl Take 1 tablet (1 mg total) by mouth daily before supper.   glucose blood test strip Use as instructed to monitor FSBS 3x daily. Dx: E11.65   hydrochlorothiazide 25 MG tablet Commonly known as:  HYDRODIURIL Take 1 tablet (25 mg total) by mouth daily.   Lancets Misc Use as instructed to monitor FSBS 3x daily. Dx: E11.65   metFORMIN 850 MG tablet Commonly known as:  Glucophage Take 1 tablet (850 mg total) by mouth 2 (two) times daily with a meal.   One-A-Day [employer] 1 each by mouth daily.   pantoprazole 40 MG tablet Commonly known as:  PROTONIX TAKE 1 TABLET BY MOUTH EVERY DAY   spironolactone 25 MG tablet Commonly known as:  ALDACTONE Take 25 mg by mouth daily.   SUMAtriptan 100 MG tablet Commonly known as:  Imitrex Take 1 tablet (100 mg total) by mouth every 2 (two) hours as needed for migraine. May repeat in 2 hours if headache persists or recurs.   zolpidem 10 MG tablet Commonly known as:   Ambien Take 1 tablet (10 mg total) by mouth at bedtime as needed for sleep.       Allergies:  Allergies  Allergen Reactions  . Almond Meal Anaphylaxis and Swelling  . Aspirin Nausea Only    Past Medical History:  Diagnosis Date  . Adrenal adenoma    as per MRI 03/09/14  . Anxiety   .  Coronary artery calcification seen on CAT scan 04/12/2018  . Diabetes mellitus   . Diverticulosis   . Elevated LFTs    elevated since 09/2013  . Fatty liver    moderate- as per 03/06/17 MRI liver  . GERD (gastroesophageal reflux disease)   . Gout   . H/O hiatal hernia   . Headache(784.0)   . History of depression   . Hyperlipemia   . Hyperplastic colon polyp   . Hypertension   . Positive hepatitis C antibody test    RNA NEGATIVE  . Thyroid nodule  managed Dr. Posey Pronto 02/27/2014  . Tobacco abuse   . Tubular adenoma of colon     Past Surgical History:  Procedure Laterality Date  . ANAL FISTULECTOMY  01/19/1985  . BREAST EXCISIONAL BIOPSY Left   . BREAST LUMPECTOMY Left 1988  . PAROTIDECTOMY  03/30/2011   Procedure: PAROTIDECTOMY;  Surgeon: Izora Gala, MD;  Location: [facility];  Service: ENT;  Laterality: Right;  . WRIST GANGLION EXCISION Left 1988    Family History  Problem Relation Age of Onset  . Coronary artery disease Father 32       CAD,CABG  . Hypertension Mother   . Stroke Mother   . Diabetes Sister        uncontrolled  . Cancer Sister 38       uterine cancer  . Aneurysm Sister        brain  . Heart attack Maternal Grandmother   . Blindness Paternal Grandmother   . Breast cancer Neg Hx     Social History:  reports that she has been smoking cigarettes. She has a 28.00 pack-year smoking history. She has never used smokeless tobacco. She reports current alcohol use of about 14.0 standard drinks of alcohol per week. She reports that she does not use drugs.   Review of Systems  Has had a right adrenal adenoma diagnosed initially in 02/2014 and stable on further  imaging subsequently  Lipid history: Managed by PCP with atorvastatin 20 mg     Lab Results  Component Value Date   CHOL 201 (A) 01/24/2018   HDL 59 01/24/2018   LDLCALC 114 01/24/2018   TRIG 138 01/24/2018   CHOLHDL 5.1 (H) 05/01/2015           Hypertension: This has been present for several years, medications include valsartan HCT Has been managed by PCP She has not monitored blood pressure regularly and does not remember her last reading, usually checking at work  Because of her hypokalemia she was told to start Spironolactone 50 daily and with this her potassium is back to normal  BP Readings from Last 3 Encounters:  04/12/18 (!) 145/80  03/31/18 140/90  12/14/17 (!) 150/88   Lab Results  Component Value Date   K 3.8 05/31/2018   Lab Results  Component Value Date   CREATININE 1.25 (H) 05/31/2018   CREATININE 0.76 03/11/2018   CREATININE 1.0 01/24/2018    Reportedly has fatty liver, last ALT normal  Lab Results  Component Value Date   ALT 26 05/31/2018     Most recent foot exam:7/19    LABS:  Lab on 05/31/2018  Component Date Value Ref Range Status  . Sodium 05/31/2018 135  135 - 145 mEq/L Final  . Potassium 05/31/2018 3.8  3.5 - 5.1 mEq/L Final  . Chloride 05/31/2018 94* 96 - 112 mEq/L Final  . CO2 05/31/2018 29  19 - 32 mEq/L Final  . Glucose, Bld  05/31/2018 148* 70 - 99 mg/dL Final  . BUN 05/31/2018 25* 6 - 23 mg/dL Final  . Creatinine, Ser 05/31/2018 1.25* 0.40 - 1.20 mg/dL Final  . Total Bilirubin 05/31/2018 0.3  0.2 - 1.2 mg/dL Final  . Alkaline Phosphatase 05/31/2018 91  39 - 117 U/L Final  . AST 05/31/2018 28  0 - 37 U/L Final  . ALT 05/31/2018 26  0 - 35 U/L Final  . Total Protein 05/31/2018 7.7  6.0 - 8.3 g/dL Final  . Albumin 05/31/2018 4.2  3.5 - 5.2 g/dL Final  . Calcium 05/31/2018 9.8  8.4 - 10.5 mg/dL Final  . GFR 05/31/2018 54.10* >60.00 mL/min Final  . Hgb A1c MFr Bld 05/31/2018 7.9* 4.6 - 6.5 % Final   Glycemic Control  Guidelines for People with Diabetes:Non Diabetic:  <6%Goal of Therapy: <7%Additional Action Suggested:  >8%     Physical Examination:  There were no vitals taken for this visit.       ASSESSMENT:  Diabetes type 2, on  metformin and Jardiance  See history of present illness for detailed discussion of current diabetes management, blood sugar patterns and problems identified  Her blood sugars are still relatively high and A1c has gone up to 7.9 She may be having some progression of her diabetes Previously had been on insulin and not clear if she would need to go back on this The last couple of days with Amaryl her blood sugars do not appear to be improving using 1 mg dosage Not clear also if her blood sugars go up after dinner which she has not monitored  HYPERTENSION: Blood pressure needs to be checked However her potassium is back to normal with 50 mg Aldactone She is does have a mild increase in creatinine but this is borderline and likely to be from being on diuretics and Jardiance Usually blood pressure is not difficult to control, does not have an adrenal adenoma on imaging  LIVER function abnormality: Back to normal  PLAN:     She will try 2 mg of Amaryl at dinnertime Does need to check her sugars consistently at all times especially after meals She will call if blood sugars are not better in the next 2 weeks Continue walking regularly She is reluctant to start insulin However may also be a candidate for Rybelsus although she is concerned about weight loss Alternatively may try Actos  Needs follow-up of blood pressure and monitoring of renal function  There are no Patient Instructions on file for this visit.      Elayne Snare 06/02/2018, 12:49 PM   Note: This office note was prepared with Dragon voice recognition system technology. Any transcriptional errors that result from this process are unintentional.

## 2018-06-08 ENCOUNTER — Telehealth: Payer: Self-pay | Admitting: *Deleted

## 2018-06-08 DIAGNOSIS — I10 Essential (primary) hypertension: Secondary | ICD-10-CM

## 2018-06-08 DIAGNOSIS — Z5181 Encounter for therapeutic drug level monitoring: Secondary | ICD-10-CM

## 2018-06-08 NOTE — Telephone Encounter (Signed)
Left message to call back  

## 2018-06-08 NOTE — Telephone Encounter (Signed)
-----   Message from Skeet Latch, MD sent at 06/04/2018 12:28 PM EDT ----- Thank you.  Her kidney function is worse.  She should go back to 12.5mg  of HCTZ and add amlodipine 5mg  instead.  Repeat BMP in one week.  ~TCR ----- Message ----- From: Earvin Hansen, LPN Sent: 04/04/4097   4:19 PM EDT To: Skeet Latch, MD  Hey Ms Mccary did not have her bmet after having her HCTZ increased but Dr [name] did labs recently [facility]

## 2018-06-17 NOTE — Telephone Encounter (Signed)
Called patient to discuss. She was on a conference call and ask that I call her back, will call her around 12 when over

## 2018-06-17 NOTE — Telephone Encounter (Addendum)
Patient has already been taking Spironolactone for almost 2 weeks. She will get bmet next week  Will have lab orders faxed to Fax # [phone]

## 2018-06-17 NOTE — Telephone Encounter (Signed)
Spoke with patient and Dr [name] decreased her HCTZ to 25 mg 1/2 tablet daily and added Aldactone 25 mg daily. The medication list was not changed and unsure if Dr Oval Linsey aware of medication changes.  Patient has not been checking her blood pressure at home. Will forward to Pharm D and B Bhagat PA for review

## 2018-06-17 NOTE — Telephone Encounter (Signed)
Aldactone can affect kidney function as well. If choose to continue this rather than starting amlodipine would recommend repeat BMET 1-2 weeks after starting Aldactone.

## 2018-06-26 ENCOUNTER — Other Ambulatory Visit: Payer: Self-pay | Admitting: Endocrinology

## 2018-07-07 ENCOUNTER — Telehealth: Payer: Self-pay | Admitting: Adult Health

## 2018-07-08 ENCOUNTER — Telehealth: Payer: Self-pay | Admitting: Adult Health

## 2018-07-08 NOTE — Telephone Encounter (Signed)
called x3 for pre reg /LVM, sent message to my chart and email

## 2018-07-13 ENCOUNTER — Telehealth: Payer: Commercial Managed Care - PPO | Admitting: Adult Health

## 2018-07-13 ENCOUNTER — Telehealth: Payer: Self-pay

## 2018-07-13 NOTE — Progress Notes (Deleted)
{Choose 1 Note Type (Telehealth Visit or Telephone Visit):[phone]}   Date:  07/13/2018   ID:  Stacy [name], DOB 03-23-1965, MRN [id number]  {Patient [phone]::"Home"} {Provider [phone]::"Home"}  PCP:  Stacy Shirts, MD  Cardiologist:  Stacy Latch, MD  Electrophysiologist:  None   Evaluation Performed:  {Choose Visit [phone]::"Follow-Up Visit"}  Chief Complaint:  ***  History of Present Illness:    Stacy [name] is a 53 y.o. female we are following for ongoing assessment and management of hypertension, with other history to include fatty liver disease, diabetes, chronically elevated LFTs, history of tobacco abuse.  The patient was last seen in the office on 04/12/2018 at the request of Dr. Coralyn [name] and had been referred for CT of the chest abdomen and pelvis due to unintentional weight loss and tobacco abuse.  She was found to have moderate calcified plaque in the thoracic aorta and left anterior descending artery.  The patient denied any chest pain but had had generalized fatigue and therefore she was not very active.  She was not eating and drinking well.  She continues to smoke less than half a pack of cigarettes a day, is under a lot of family stress taking care of her mother who is at home on the ventilator.  Upon assessment by Dr. Oval [name] she did not feel that the patient's symptoms were as result of obstructive coronary artery disease, and prevention was to be focused upon at that time.  She was advised to increase her activity and exercise of 250 minutes a week, reduce her milk intake and begin a low-cholesterol diet.  Blood pressure was elevated and therefore HCTZ was increased to 25 mg daily.  She was advised on smoking cessation and was given a prescription for Chantix.  Follow-up labs revealed worsening kidney function and therefore HCTZ was cut back to 12.5 mg daily and amlodipine was started at 5 mg daily with follow-up labs.  She  was temporarily placed on Aldactone but this was discontinued due to renal function, as this was started by PCP. At the time of this office visit, labs are not available. Creatinine on 05/2018 was 1.25.   The patient {does/does not:200015} have symptoms concerning for COVID-19 infection (fever, chills, cough, or new shortness of breath).    Past Medical History:  Diagnosis Date  . Adrenal adenoma    as per MRI 03/09/14  . Anxiety   . Coronary artery calcification seen on CAT scan 04/12/2018  . Diabetes mellitus   . Diverticulosis   . Elevated LFTs    elevated since 09/2013  . Fatty liver    moderate- as per 03/06/17 MRI liver  . GERD (gastroesophageal reflux disease)   . Gout   . H/O hiatal hernia   . Headache(784.0)   . History of depression   . Hyperlipemia   . Hyperplastic colon polyp   . Hypertension   . Positive hepatitis C antibody test    RNA NEGATIVE  . Thyroid nodule  managed Stacy [name] 02/27/2014  . Tobacco abuse   . Tubular adenoma of colon    Past Surgical History:  Procedure Laterality Date  . ANAL FISTULECTOMY  01/19/1985  . BREAST EXCISIONAL BIOPSY Left   . BREAST LUMPECTOMY Left 1988  . PAROTIDECTOMY  03/30/2011   Procedure: PAROTIDECTOMY;  Surgeon: Stacy Gala, MD;  Location: [place];  Service: ENT;  Laterality: Right;  . WRIST GANGLION EXCISION Left 1988     No outpatient medications have been marked as  taking for the 07/13/18 encounter (Appointment) with Stacy Colonel, NP.     Allergies:   Almond meal and Aspirin   Social History   Tobacco Use  . Smoking status: Current Every Day Smoker    Packs/day: 1.00    Years: 28.00    Pack years: 28.00    Types: Cigarettes  . Smokeless tobacco: Never Used  Substance Use Topics  . Alcohol use: Yes    Alcohol/week: 14.0 standard drinks    Types: 14 Standard drinks or equivalent per week  . Drug use: No     Family Hx: The patient's family history includes Aneurysm in her sister;  Blindness in her paternal grandmother; Cancer (age of onset: 40) in her sister; Coronary artery disease (age of onset: 57) in her father; Diabetes in her sister; Heart attack in her maternal grandmother; Hypertension in her mother; Stroke in her mother. There is no history of Breast cancer.  ROS:   Please see the history of present illness.    *** All other systems reviewed and are negative.   Prior CV studies:   The following studies were reviewed today:  ***  Labs/Other Tests and Data Reviewed:    EKG:  {EKG/Telemetry Strips [phone]}  Recent Labs: 01/24/2018: Hemoglobin 14.2; TSH 0.70 05/31/2018: ALT 26; BUN 25; Creatinine, Ser 1.25; Potassium 3.8; Sodium 135   Recent Lipid Panel Lab Results  Component Value Date/Time   CHOL 201 (A) 01/24/2018   TRIG 138 01/24/2018   HDL 59 01/24/2018   CHOLHDL 5.1 (H) 05/01/2015 10:09 AM   LDLCALC 114 01/24/2018    Wt Readings from Last 3 Encounters:  03/31/18 134 lb 9.6 oz (61.1 kg)  12/14/17 138 lb 12.8 oz (63 kg)  08/23/17 138 lb (62.6 kg)     Objective:    Vital Signs:  There were no vitals taken for this visit.   {[facility] Virtual Exam (Optional):[phone]::"VITAL SIGNS:  reviewed"}  ASSESSMENT & PLAN:    1. ***  COVID-19 Education: The signs and symptoms of COVID-19 were discussed with the patient and how to seek care for testing (follow up with PCP or arrange E-visit).  ***The importance of social distancing was discussed today.  Time:   Today, I have spent *** minutes with the patient with telehealth technology discussing the above problems.     Medication Adjustments/Labs and Tests Ordered: Current medicines are reviewed at length with the patient today.  Concerns regarding medicines are outlined above.   Tests Ordered: No orders of the defined types were placed in this encounter.   Medication Changes: No orders of the defined types were placed in this encounter.   Disposition:  Follow up {follow  up:15908}  Signed, Stacy [name]. Stacy [name], ANP, AACC  07/13/2018 7:16 AM    [facility]

## 2018-07-13 NOTE — Telephone Encounter (Signed)
I attempted the call 4xs for appointment. I lvm for pt to return call

## 2018-07-21 NOTE — Telephone Encounter (Signed)
Open n error °

## 2018-08-02 ENCOUNTER — Encounter: Payer: Self-pay | Admitting: Endocrinology

## 2018-08-02 ENCOUNTER — Other Ambulatory Visit: Payer: Self-pay

## 2018-08-02 ENCOUNTER — Other Ambulatory Visit (INDEPENDENT_AMBULATORY_CARE_PROVIDER_SITE_OTHER): Payer: Commercial Managed Care - PPO

## 2018-08-02 DIAGNOSIS — E1165 Type 2 diabetes mellitus with hyperglycemia: Secondary | ICD-10-CM | POA: Diagnosis not present

## 2018-08-02 LAB — COMPREHENSIVE METABOLIC PANEL
ALT: 32 U/L (ref 0–35)
AST: 31 U/L (ref 0–37)
Albumin: 4.5 g/dL (ref 3.5–5.2)
Alkaline Phosphatase: 89 U/L (ref 39–117)
BUN: 17 mg/dL (ref 6–23)
CO2: 31 mEq/L (ref 19–32)
Calcium: 10.4 mg/dL (ref 8.4–10.5)
Chloride: 97 mEq/L (ref 96–112)
Creatinine, Ser: 0.87 mg/dL (ref 0.40–1.20)
GFR: 82.14 mL/min (ref >60.00)
Glucose, Bld: 132 mg/dL — ABNORMAL HIGH (ref 70–99)
Potassium: 3.6 mEq/L (ref 3.5–5.1)
Sodium: 138 mEq/L (ref 135–145)
Total Bilirubin: 0.6 mg/dL (ref 0.2–1.2)
Total Protein: 8.3 g/dL (ref 6.0–8.3)

## 2018-08-03 LAB — FRUCTOSAMINE: Fructosamine: 244 umol/L (ref 0–285)

## 2018-08-04 ENCOUNTER — Encounter: Payer: Self-pay | Admitting: Endocrinology

## 2018-08-04 ENCOUNTER — Ambulatory Visit (INDEPENDENT_AMBULATORY_CARE_PROVIDER_SITE_OTHER): Payer: Commercial Managed Care - PPO | Admitting: Endocrinology

## 2018-08-04 ENCOUNTER — Other Ambulatory Visit: Payer: Self-pay

## 2018-08-04 DIAGNOSIS — I10 Essential (primary) hypertension: Secondary | ICD-10-CM

## 2018-08-04 DIAGNOSIS — E1165 Type 2 diabetes mellitus with hyperglycemia: Secondary | ICD-10-CM | POA: Diagnosis not present

## 2018-08-04 DIAGNOSIS — E782 Mixed hyperlipidemia: Secondary | ICD-10-CM | POA: Diagnosis not present

## 2018-08-04 MED ORDER — PIOGLITAZONE HCL 15 MG PO TABS: 15.0000 mg | ORAL_TABLET | Freq: Every day | ORAL | 2 refills | Status: DC

## 2018-08-04 NOTE — Progress Notes (Signed)
Patient ID: Stacy [name], female   DOB: [birth year], 53 y.o.   MRN: [id number]          Today's office visit was provided via telemedicine using video technique Explained to the patient and the the limitations of evaluation and management by telemedicine and the availability of in person appointments.  The patient understood the limitations and agreed to proceed. Patient also understood that the telehealth visit is billable. . Location of the patient: Home . Location of the provider: Office Only the patient and myself were participating in the encounter   Reason for Appointment: Follow-up for Type 2 Diabetes  Referring physician: Emi Belfast     History of Present Illness:          Date of diagnosis of type 2 diabetes mellitus: 2014        Background history:    She apparently was asymptomatic at the time of diagnosis She was started on metformin but no details of initial management or A1c are available She thinks about 3 years ago she was started on Farxiga also with improved control and no side effects with this She has mostly been followed by a PCP but may have seen an endocrinologist also About 1-1/2-2 years ago she was also given basal insulin with Tyler Aas presumably for higher sugars but not clear what her A1c was at that time  Recent history:    Her A1c was last higher at 7.9, previously 7.2 in January Fructosamine is now 244, slightly improved  Non-insulin hypoglycemic drugs the patient is taking are: Jardiance 25 mg daily, metformin 850 mg twice daily, Amaryl 2mg  at dinner  Current management, blood sugar patterns and problems identified:  She has not increased her Amaryl from 1 mg to 2 mg as directed  Previously had difficulties with getting her prescription on time also  She has only a few readings in the mornings and they appear to be relatively better but not clear if her average sugar is better at home; previous blood sugars have been as high as 201   Lab glucose was 132 fasting  She forgets to check her sugars after meals  She thinks her largest meal is lunch  However she thinks her appetite is decreased overall and she is losing weight again which she is concerned about  Because of various family issues she has not done much exercise lately although has been usually trying to walk a couple of times a week        Side effects from medications have been: Diarrhea from metformin  Compliance with the medical regimen:  Fair Hypoglycemia: none   Glucose monitoring:  done  1 times a day         Glucometer:  Accu-Chek      Blood Glucose readings by patient reporting  Morning blood sugar range 132-178 for the last 7 readings   Self-care: The diet that the patient has been following is: tries to limit fried food.            Dietician visit, most recent: None  Weight history:  Wt Readings from Last 3 Encounters:  03/31/18 134 lb 9.6 oz (61.1 kg)  12/14/17 138 lb 12.8 oz (63 kg)  08/23/17 138 lb (62.6 kg)    Glycemic control:   Lab Results  Component Value Date   HGBA1C 7.9 (H) 05/31/2018   HGBA1C 7.2 01/24/2018   HGBA1C 6.7 (A) 11/02/2017   Lab Results  Component Value Date   MICROALBUR 24.8 (  H) 12/10/2017   LDLCALC 114 01/24/2018   CREATININE 0.87 08/02/2018   Lab Results  Component Value Date   MICRALBCREAT 6.5 12/10/2017    Lab Results  Component Value Date   FRUCTOSAMINE 244 08/02/2018   FRUCTOSAMINE 248 03/11/2018   FRUCTOSAMINE 239 12/10/2017      Allergies as of 08/04/2018      Reactions   Almond Meal Anaphylaxis, Swelling   Aspirin Nausea Only      Medication List       Accurate as of August 04, 2018 12:59 PM. If you have any questions, ask your nurse or doctor.        albuterol 108 (90 Base) MCG/ACT inhaler Commonly known as: VENTOLIN HFA Inhale 2 puffs into the lungs every 4 (four) hours as needed for wheezing or shortness of breath.   allopurinol 100 MG tablet Commonly known as:  ZYLOPRIM TAKE ONE TABLET BY MOUTH DAILY. START ON 04/28/2017.   atorvastatin 20 MG tablet Commonly known as: LIPITOR Take 20 mg by mouth daily.   colchicine 0.6 MG tablet Take 1 tablet (0.6 mg total) by mouth daily.   empagliflozin 25 MG Tabs tablet Commonly known as: JARDIANCE Take 25 mg by mouth daily.   EPINEPHrine 0.3 mg/0.3 mL Soaj injection Commonly known as: EPI-PEN Insert into muscle once with any facial or neck swelling  Instruct in use   FLUoxetine 40 MG capsule Commonly known as: PROZAC TAKE 1 CAPSULE BY MOUTH DAILY.   glimepiride 1 MG tablet Commonly known as: AMARYL TAKE 1 TABLET BY MOUTH DAILY BEFORE SUPPER.   glucose blood test strip Use as instructed to monitor FSBS 3x daily. Dx: E11.65   hydrochlorothiazide 25 MG tablet Commonly known as: HYDRODIURIL Take 25 mg by mouth as directed. Take 1/2 tablet daily   Lancets Misc Use as instructed to monitor FSBS 3x daily. Dx: E11.65   metFORMIN 850 MG tablet Commonly known as: Glucophage Take 1 tablet (850 mg total) by mouth 2 (two) times daily with a meal.   One-A-Day Womens [employer] 1 each by mouth daily.   pantoprazole 40 MG tablet Commonly known as: PROTONIX TAKE 1 TABLET BY MOUTH EVERY DAY   spironolactone 25 MG tablet Commonly known as: ALDACTONE Take 25 mg by mouth daily.   spironolactone 50 MG tablet Commonly known as: ALDACTONE TAKE 1 TABLET BY MOUTH EVERY DAY   SUMAtriptan 100 MG tablet Commonly known as: Imitrex Take 1 tablet (100 mg total) by mouth every 2 (two) hours as needed for migraine. May repeat in 2 hours if headache persists or recurs.   zolpidem 10 MG tablet Commonly known as: Ambien Take 1 tablet (10 mg total) by mouth at bedtime as needed for sleep.       Allergies:  Allergies  Allergen Reactions  . Almond Meal Anaphylaxis and Swelling  . Aspirin Nausea Only    Past Medical History:  Diagnosis Date  . Adrenal adenoma    as per MRI 03/09/14  . Anxiety    . Coronary artery calcification seen on CAT scan 04/12/2018  . Diabetes mellitus   . Diverticulosis   . Elevated LFTs    elevated since 09/2013  . Fatty liver    moderate- as per 03/06/17 MRI liver  . GERD (gastroesophageal reflux disease)   . Gout   . H/O hiatal hernia   . Headache(784.0)   . History of depression   . Hyperlipemia   . Hyperplastic colon polyp   . Hypertension   .  Positive hepatitis C antibody test    RNA NEGATIVE  . Thyroid nodule  managed Dr. Posey Pronto 02/27/2014  . Tobacco abuse   . Tubular adenoma of colon     Past Surgical History:  Procedure Laterality Date  . ANAL FISTULECTOMY  01/19/1985  . BREAST EXCISIONAL BIOPSY Left   . BREAST LUMPECTOMY Left 1988  . PAROTIDECTOMY  03/30/2011   Procedure: PAROTIDECTOMY;  Surgeon: Izora Gala, MD;  Location: [facility];  Service: ENT;  Laterality: Right;  . WRIST GANGLION EXCISION Left 1988    Family History  Problem Relation Age of Onset  . Coronary artery disease Father 55       CAD,CABG  . Hypertension Mother   . Stroke Mother   . Diabetes Sister        uncontrolled  . Cancer Sister 35       uterine cancer  . Aneurysm Sister        brain  . Heart attack Maternal Grandmother   . Blindness Paternal Grandmother   . Breast cancer Neg Hx     Social History:  reports that she has been smoking cigarettes. She has a 28.00 pack-year smoking history. She has never used smokeless tobacco. She reports current alcohol use of about 14.0 standard drinks of alcohol per week. She reports that she does not use drugs.   Review of Systems  Has had a right adrenal adenoma diagnosed initially in 02/2014 and stable on further imaging subsequently  Lipid history: Managed by PCP with atorvastatin 20 mg     Lab Results  Component Value Date   CHOL 201 (A) 01/24/2018   HDL 59 01/24/2018   LDLCALC 114 01/24/2018   TRIG 138 01/24/2018   CHOLHDL 5.1 (H) 05/01/2015           Hypertension: This has been present  for several years Currently on half HCTZ, Aldactone but does not have any ARB drug on her list currently, previously on valsartan  Has been managed by PCP She has not monitored blood pressure periodically at work and does not remember her last reading, usually blood pressure is reportedly normal  Because of her hypokalemia she has been on Spironolactone 50 daily and with this her potassium is again normal  BP Readings from Last 3 Encounters:  04/12/18 (!) 145/80  03/31/18 140/90  12/14/17 (!) 150/88   Lab Results  Component Value Date   K 3.6 08/02/2018   Lab Results  Component Value Date   CREATININE 0.87 08/02/2018   CREATININE 1.25 (H) 05/31/2018   CREATININE 0.76 03/11/2018    Reportedly has fatty liver, last ALT normal  Lab Results  Component Value Date   ALT 32 08/02/2018     Most recent foot exam:7/19  She is asking about decreased appetite and weight loss and has had a lot of stress, currently does not have a PCP  LABS:  Lab on 08/02/2018  Component Date Value Ref Range Status  . Fructosamine 08/02/2018 244  0 - 285 umol/L Final   Comment: Published reference interval for apparently healthy subjects between age 68 and 65 is 59 - 285 umol/L and in a poorly controlled diabetic population is 228 - 563 umol/L with a mean of 396 umol/L.   Marland Kitchen Sodium 08/02/2018 138  135 - 145 mEq/L Final  . Potassium 08/02/2018 3.6  3.5 - 5.1 mEq/L Final  . Chloride 08/02/2018 97  96 - 112 mEq/L Final  . CO2 08/02/2018 31  19 -  32 mEq/L Final  . Glucose, Bld 08/02/2018 132* 70 - 99 mg/dL Final  . BUN 08/02/2018 17  6 - 23 mg/dL Final  . Creatinine, Ser 08/02/2018 0.87  0.40 - 1.20 mg/dL Final  . Total Bilirubin 08/02/2018 0.6  0.2 - 1.2 mg/dL Final  . Alkaline Phosphatase 08/02/2018 89  39 - 117 U/L Final  . AST 08/02/2018 31  0 - 37 U/L Final  . ALT 08/02/2018 32  0 - 35 U/L Final  . Total Protein 08/02/2018 8.3  6.0 - 8.3 g/dL Final  . Albumin 08/02/2018 4.5  3.5 - 5.2  g/dL Final  . Calcium 08/02/2018 10.4  8.4 - 10.5 mg/dL Final  . GFR 08/02/2018 82.14  >60.00 mL/min Final    Physical Examination:  There were no vitals taken for this visit.       ASSESSMENT:  Diabetes type 2, on  metformin, Amaryl and Jardiance  See history of present illness for detailed discussion of current diabetes management, blood sugar patterns and problems identified  Although her A1c was 7.9 her fructosamine is 244  Fasting readings are mildly increased but she did not increase her Amaryl as directed on the last visit also She is again concerned about weight loss and decreased appetite which may be stress related Her diet has been inconsistent However not clear if her sugars are higher after lunch which is her main meal   HYPERTENSION: Blood pressure needs to be checked regularly and she is not clear about her blood pressure readings are Continue HCTZ half tablet and also Aldactone  Weight loss and decreased appetite: There may be a problem with depression which she had in 2015 also Given her list of PCPs in our practice who she can see for general care and further evaluation  PLAN:     Start 15 mg Actos, discussed benefits of this medication, unlikely that she will get edema because of already being on Aldactone and HCTZ To check blood sugars at least half the time after lunch and some in the evenings also Discussed blood sugar targets at various times In the meantime we will continue the same dose of Amaryl Reduce Jardiance to half tablet to reduce tendency to weight loss Regular exercise Balanced meals at all times Follow-up in 8 weeks again    There are no Patient Instructions on file for this visit.  Patient instructions on my chart:  Please check half of your readings about 2 hours after lunch Reduce Jardiance to half a tablet Start new medication pioglitazone 15 mg daily at breakfast Please call to schedule follow-up in about 2 months When you  come for labs please bring your meter for download  Total visit time for evaluation and management of multiple problems and counseling =25 minutes  Elayne Snare 08/04/2018, 12:59 PM   Note: This office note was prepared with Dragon voice recognition system technology. Any transcriptional errors that result from this process are unintentional.

## 2018-09-05 ENCOUNTER — Other Ambulatory Visit: Payer: Self-pay | Admitting: Family Medicine

## 2018-09-14 ENCOUNTER — Other Ambulatory Visit: Payer: Self-pay | Admitting: Orthopaedic Surgery

## 2018-09-14 MED ORDER — ZOLPIDEM TARTRATE 10 MG PO TABS: 10.0000 mg | ORAL_TABLET | Freq: Every evening | ORAL | 0 refills | Status: DC | PRN

## 2018-09-14 NOTE — Progress Notes (Signed)
PCP retired. ambien 10mg    # 10 tabs only , one po q HS prn sleep sent in. No refills.

## 2018-09-23 ENCOUNTER — Encounter: Payer: Self-pay | Admitting: Internal Medicine

## 2018-09-28 ENCOUNTER — Other Ambulatory Visit: Payer: Self-pay | Admitting: Family Medicine

## 2018-09-30 ENCOUNTER — Other Ambulatory Visit: Payer: Self-pay

## 2018-10-03 ENCOUNTER — Ambulatory Visit (INDEPENDENT_AMBULATORY_CARE_PROVIDER_SITE_OTHER): Payer: Commercial Managed Care - PPO | Admitting: Family Medicine

## 2018-10-03 ENCOUNTER — Other Ambulatory Visit: Payer: Self-pay

## 2018-10-03 ENCOUNTER — Encounter: Payer: Self-pay | Admitting: Family Medicine

## 2018-10-03 VITALS — BP 138/76 | HR 100 | Temp 98.4°F | Resp 14 | Ht 63.0 in | Wt 136.0 lb

## 2018-10-03 DIAGNOSIS — R109 Unspecified abdominal pain: Secondary | ICD-10-CM | POA: Diagnosis not present

## 2018-10-03 DIAGNOSIS — F331 Major depressive disorder, recurrent, moderate: Secondary | ICD-10-CM

## 2018-10-03 DIAGNOSIS — N760 Acute vaginitis: Secondary | ICD-10-CM | POA: Diagnosis not present

## 2018-10-03 DIAGNOSIS — G4700 Insomnia, unspecified: Secondary | ICD-10-CM

## 2018-10-03 DIAGNOSIS — I10 Essential (primary) hypertension: Secondary | ICD-10-CM | POA: Diagnosis not present

## 2018-10-03 DIAGNOSIS — E119 Type 2 diabetes mellitus without complications: Secondary | ICD-10-CM

## 2018-10-03 DIAGNOSIS — E7800 Pure hypercholesterolemia, unspecified: Secondary | ICD-10-CM

## 2018-10-03 DIAGNOSIS — F1029 Alcohol dependence with unspecified alcohol-induced disorder: Secondary | ICD-10-CM

## 2018-10-03 DIAGNOSIS — Z794 Long term (current) use of insulin: Secondary | ICD-10-CM

## 2018-10-03 LAB — URINALYSIS, ROUTINE W REFLEX MICROSCOPIC
Bilirubin Urine: NEGATIVE
Hgb urine dipstick: NEGATIVE
Ketones, ur: NEGATIVE
Leukocytes,Ua: NEGATIVE
Nitrite: NEGATIVE
Protein, ur: NEGATIVE
Specific Gravity, Urine: 1.02 (ref 1.001–1.03)
pH: 5.5 (ref 5.0–8.0)

## 2018-10-03 LAB — WET PREP FOR TRICH, YEAST, CLUE

## 2018-10-03 MED ORDER — FLUCONAZOLE 150 MG PO TABS: 150.0000 mg | ORAL_TABLET | Freq: Once | ORAL | 1 refills | Status: AC

## 2018-10-03 MED ORDER — FLUOXETINE HCL 40 MG PO CAPS: ORAL_CAPSULE | ORAL | 2 refills | Status: DC

## 2018-10-03 MED ORDER — FLUOXETINE HCL 20 MG PO TABS: ORAL_TABLET | ORAL | 0 refills | Status: DC

## 2018-10-03 MED ORDER — ZOLPIDEM TARTRATE 10 MG PO TABS: 10.0000 mg | ORAL_TABLET | Freq: Every evening | ORAL | 2 refills | Status: DC | PRN

## 2018-10-03 MED ORDER — CLOTRIMAZOLE 1 % EX CREA: 1.0000 "application " | TOPICAL_CREAM | Freq: Two times a day (BID) | CUTANEOUS | 1 refills | Status: DC

## 2018-10-03 MED ORDER — METFORMIN HCL 850 MG PO TABS: 850.0000 mg | ORAL_TABLET | Freq: Two times a day (BID) | ORAL | 3 refills | Status: DC

## 2018-10-03 NOTE — Patient Instructions (Addendum)
Get pneumonia vaccine 23 at work  Flu shot at work  [employer] prozac 20mg  for 2 weeks then go up 40mg   ambien for sleep   take diflucan and use clotrimazole cream externally  Vitamin D 1000IU  F/U 2 months for medications    [email or web address]

## 2018-10-03 NOTE — Progress Notes (Signed)
Subjective:    Patient ID: Stacy [name], female    DOB: 02-03-1965, 53 y.o.   MRN: [id number]  Patient presents for Medication Review/ Refills (is not fasting)   Pt here to f/u chronic medical problems  DM- last A1C 7.5% she did this at work , now on [employer], Metformin,actos She does need medication refills.  Recently her mother passed and then step mother 3 weeks later, she is not sleeping well, she has been self-medicating with alcohol more in the evening time.  She is out of her Prozac she is also out of her Ambien which she used to help her sleep.  She has to change a different PCP but that person has now left which is why she is back at my office.  She did reach out to a therapist and psychiatrist but was told that she had to go to a couple different steps before she could see his psychiatrist sounds like she was said to have an intake appointment and then a therapy appointment and then see the psychiatrist advised her that this was typical.  Multiple other concerns including elevated Dickens blood cell count on her recent labs from work she did show me on her phone mildly elevated 11.7%.  She has had vaginal itching for about a month she used Monistat a month ago still using the cream externally for itching.  She has minimal vaginal discharge no bleeding.  She also has urinary frequency but no burning with urination.  She is also had episodes of flank pain that wraps around.  Denies any low back pain no change in her bowels.  And note this was brought up at the end of the visit after we reviewed her medications and everything else above. That will to see gastroenterology next week for repeat colonoscopy    She bruises easily she is not on any blood thinners reviewed her labs her platelets were normal    Vaginal itching for month, used monistat a month ago , still using cream exernally     Dr. Lorin Mercy gave her 10 tablets last month , she had been breaking in half    She has been  out of prozac for > for past months     Hyperipideia- LDL  86  , TC 177  ( now back on lipitor she had ran out of this medication recently)         Review Of Systems: Per above  GEN- denies fatigue, fever, weight loss,weakness, recent illness HEENT- denies eye drainage, change in vision, nasal discharge, CVS- denies chest pain, palpitations RESP- denies SOB, cough, wheeze ABD- denies N/V, change in stools, abd pain GU- denies dysuria, hematuria, dribbling, incontinence MSK- denies joint pain, muscle aches, injury Neuro- denies headache, dizziness, syncope, seizure activity       Objective:    BP 138/76   Pulse 100   Temp 98.4 F (36.9 C) (Oral)   Resp 14   Ht 5\' 3"  (1.6 m)   Wt 136 lb (61.7 kg)   SpO2 98%   BMI 24.09 kg/m  GEN- NAD, alert and oriented x3 HEENT- PERRL, EOMI, non injected sclera, pink conjunctiva, MMM, oropharynx clear Neck- Supple, no LAD CVS- RRR, no murmur RESP-CTAB Psych- depressed affect, not anxious, no SI  ABD-NABS,soft,NT,ND GU- erythema between labial folds, mild Koogler discharge vaginal mucosa pink and moist, cervix visualized no growth, no blood form os, minimal thin clear discharge, no CMT, no ovarian masses, uterus normal size EXT- No  edema Pulses- Radial, DP- 2+  PHQ 9 score 11       Assessment & Plan:      Problem List Items Addressed This Visit      Unprioritized   Diabetes mellitus, type II, insulin dependent ([place])    Diabetes is followed by endocrinology.      Relevant Medications   metFORMIN (GLUCOPHAGE) 850 MG tablet   Other Relevant Orders   CBC with Differential/Platelet   Basic metabolic panel   EtOH dependence ([place])   Hyperlipemia   Hypertension    Blood pressure looks okay no change in medication today. Rechecking her renal function today as well as her Galindo blood cell count      Insomnia - Primary    I have refilled Ambien 10 mg.      MDD (major depressive disorder)    Major depression and she is  still using alcohol to self medicate.  Restart her Prozac at 20 mg for 2 weeks then go back to 40 mg which she did better at this dose.  Also recommended she proceed with the psycho therapy and psychiatrist that she had started to set herself up with.  Her alcohol coping skills along with her bereavement are difficult to control think she would benefit from psychotherapy.  sHe agrees to do so.      Relevant Medications   FLUoxetine (PROZAC) 20 MG tablet   FLUoxetine (PROZAC) 40 MG capsule    Other Visit Diagnoses    Acute vaginitis       Treat with diflucan and topical clotrimazole, Jardiance likley culprit, increase water, yogurt/probiotics   Relevant Orders   WET PREP FOR [place], YEAST, CLUE (Completed)   CBC with Differential/Platelet   Flank pain       Culture sent    Relevant Orders   Urinalysis, Routine w reflex microscopic (Completed)   Urine Culture      Note: This dictation was prepared with Dragon dictation along with smaller phrase technology. Any transcriptional errors that result from this process are unintentional.

## 2018-10-03 NOTE — Assessment & Plan Note (Signed)
I have refilled Ambien 10 mg.

## 2018-10-03 NOTE — Assessment & Plan Note (Addendum)
Major depression and she is still using alcohol to self medicate.  Restart her Prozac at 20 mg for 2 weeks then go back to 40 mg which she did better at this dose.  Also recommended she proceed with the psycho therapy and psychiatrist that she had started to set herself up with.  Her alcohol coping skills along with her bereavement are difficult to control think she would benefit from psychotherapy.  sHe agrees to do so.

## 2018-10-03 NOTE — Assessment & Plan Note (Signed)
Diabetes is followed by endocrinology.

## 2018-10-03 NOTE — Assessment & Plan Note (Addendum)
Blood pressure looks okay no change in medication today. Rechecking her renal function today as well as her Bruinsma blood cell count

## 2018-10-04 LAB — BASIC METABOLIC PANEL
BUN: 21 mg/dL (ref 7–25)
CO2: 29 mmol/L (ref 20–32)
Calcium: 10.2 mg/dL (ref 8.6–10.4)
Chloride: 100 mmol/L (ref 98–110)
Creat: 1.02 mg/dL (ref 0.50–1.05)
Glucose, Bld: 148 mg/dL — ABNORMAL HIGH (ref 65–99)
Potassium: 4.3 mmol/L (ref 3.5–5.3)
Sodium: 140 mmol/L (ref 135–146)

## 2018-10-04 LAB — CBC WITH DIFFERENTIAL/PLATELET
Absolute Monocytes: 762 cells/uL (ref 200–950)
Basophils Absolute: 62 cells/uL (ref 0–200)
Basophils Relative: 0.6 %
Eosinophils Absolute: 185 cells/uL (ref 15–500)
Eosinophils Relative: 1.8 %
HCT: 38.3 % (ref 35.0–45.0)
Hemoglobin: 12.8 g/dL (ref 11.7–15.5)
Lymphs Abs: 3245 cells/uL (ref 850–3900)
MCH: 28.1 pg (ref 27.0–33.0)
MCHC: 33.4 g/dL (ref 32.0–36.0)
MCV: 84 fL (ref 80.0–100.0)
MPV: 10.7 fL (ref 7.5–12.5)
Monocytes Relative: 7.4 %
Neutro Abs: 6046 cells/uL (ref 1500–7800)
Neutrophils Relative %: 58.7 %
Platelets: 429 10*3/uL — ABNORMAL HIGH (ref 140–400)
RBC: 4.56 10*6/uL (ref 3.80–5.10)
RDW: 15.6 % — ABNORMAL HIGH (ref 11.0–15.0)
Total Lymphocyte: 31.5 %
WBC: 10.3 10*3/uL (ref 3.8–10.8)

## 2018-10-05 ENCOUNTER — Encounter: Payer: Self-pay | Admitting: Family Medicine

## 2018-10-05 LAB — URINE CULTURE
MICRO NUMBER:: 878125
SPECIMEN QUALITY:: ADEQUATE

## 2018-10-05 MED ORDER — FLUCONAZOLE 150 MG PO TABS: 150.0000 mg | ORAL_TABLET | Freq: Once | ORAL | 0 refills | Status: AC

## 2018-10-05 MED ORDER — CEPHALEXIN 500 MG PO CAPS: 500.0000 mg | ORAL_CAPSULE | Freq: Two times a day (BID) | ORAL | 0 refills | Status: DC

## 2018-10-05 MED ORDER — PANTOPRAZOLE SODIUM 40 MG PO TBEC: DELAYED_RELEASE_TABLET | ORAL | 1 refills | Status: DC

## 2018-10-05 NOTE — Addendum Note (Signed)
Addended by: Vic Blackbird F on: 10/05/2018 05:15 PM   Modules accepted: Orders

## 2018-10-07 ENCOUNTER — Other Ambulatory Visit: Payer: Self-pay | Admitting: Family Medicine

## 2018-10-13 ENCOUNTER — Encounter: Payer: Self-pay | Admitting: *Deleted

## 2018-10-28 ENCOUNTER — Telehealth: Payer: Self-pay | Admitting: Radiology

## 2018-10-28 ENCOUNTER — Other Ambulatory Visit (INDEPENDENT_AMBULATORY_CARE_PROVIDER_SITE_OTHER): Payer: Self-pay | Admitting: Orthopaedic Surgery

## 2018-10-28 NOTE — Telephone Encounter (Signed)
Stop atorvastatin while taking colchicine, and it should minimize the risk.

## 2018-10-28 NOTE — Telephone Encounter (Signed)
Can you advise since Dr. Yates is out of the office? 

## 2018-10-28 NOTE — Telephone Encounter (Signed)
Marita Kansas from [facility] called stated Dr. Junius Roads filled prescription of colchicine, however there is a contraindication because patient is also taking atorvastatin, together increases risk of muscle weakness.  Is it ok to still fill prescription? Marita Kansas states risk should be relatively low since th colchicine is only 1 tablet on as needed basis.  Call back [phone]

## 2018-10-31 NOTE — Telephone Encounter (Signed)
I called the pharmacy and spoke with Drue Dun: the patient is to be instructed that when she takes the colchicine (each episode of starting it), she should stop the atorvastatin. She does take the colchicine only prn (despite the instructions saying 1 daily). So, it is ok to go ahead and fill the colchrys.

## 2018-10-31 NOTE — Telephone Encounter (Signed)
I attempted to call pharmacy on Friday to advise, no response.  I called again this morning, they now have additional questions with stopping the atorvastatin, due to colchicine directions. Please follow up with pharmacy and advise [phone]

## 2018-11-03 ENCOUNTER — Encounter: Payer: Self-pay | Admitting: Family Medicine

## 2018-11-04 MED ORDER — FLUOXETINE HCL 40 MG PO CAPS: ORAL_CAPSULE | ORAL | 2 refills | Status: DC

## 2018-11-04 NOTE — Telephone Encounter (Signed)
Patient requesting intermittent FMLA. Does she need to send to therapist or can we complete?

## 2018-11-04 NOTE — Addendum Note (Signed)
Addended by: Sheral Flow on: 11/04/2018 10:24 AM   Modules accepted: Orders

## 2018-11-07 ENCOUNTER — Other Ambulatory Visit: Payer: Self-pay

## 2018-11-07 ENCOUNTER — Ambulatory Visit (AMBULATORY_SURGERY_CENTER): Payer: Self-pay

## 2018-11-07 VITALS — Temp 98.2°F | Ht 63.0 in | Wt 139.0 lb

## 2018-11-07 DIAGNOSIS — K219 Gastro-esophageal reflux disease without esophagitis: Secondary | ICD-10-CM

## 2018-11-07 DIAGNOSIS — Z86010 Personal history of colonic polyps: Secondary | ICD-10-CM

## 2018-11-07 MED ORDER — NA SULFATE-K SULFATE-MG SULF 17.5-3.13-1.6 GM/177ML PO SOLN: 1.0000 | Freq: Once | ORAL | 0 refills | Status: AC

## 2018-11-07 NOTE — Progress Notes (Signed)
Denies allergies to eggs or soy products. Denies complication of anesthesia or sedation. Denies use of weight loss medication. Denies use of O2.   Emmi instructions given for colonoscopy.  A 15.00 coupon for Suprep was given to the patient. Covid Screening is scheduled for 11/16/18 @ 3:00 Pm. Patient verbalizes understanding.

## 2018-11-10 ENCOUNTER — Encounter: Payer: Self-pay | Admitting: Internal Medicine

## 2018-11-10 ENCOUNTER — Encounter: Payer: Commercial Managed Care - PPO | Admitting: Internal Medicine

## 2018-11-15 ENCOUNTER — Encounter: Payer: Self-pay | Admitting: Family Medicine

## 2018-11-16 ENCOUNTER — Other Ambulatory Visit: Payer: Self-pay | Admitting: Internal Medicine

## 2018-11-17 LAB — SARS CORONAVIRUS 2 (TAT 6-24 HRS): SARS Coronavirus 2: NEGATIVE

## 2018-11-21 ENCOUNTER — Telehealth: Payer: Self-pay | Admitting: Internal Medicine

## 2018-11-21 ENCOUNTER — Encounter: Payer: Commercial Managed Care - PPO | Admitting: Internal Medicine

## 2018-11-21 NOTE — Telephone Encounter (Signed)
Hi Dr. Hilarie Fredrickson, this patient left a message with the answering service at 6:43am cancelling her procedure today. She states that her care partner got sick and she does not have another person to come with her. She will call back to reschedule. Thank you.

## 2018-11-21 NOTE — Telephone Encounter (Signed)
Ok DNS FYI

## 2018-12-01 ENCOUNTER — Encounter: Payer: Self-pay | Admitting: Family Medicine

## 2018-12-01 ENCOUNTER — Other Ambulatory Visit: Payer: Self-pay

## 2018-12-01 DIAGNOSIS — Z1159 Encounter for screening for other viral diseases: Secondary | ICD-10-CM

## 2018-12-05 ENCOUNTER — Ambulatory Visit: Payer: Self-pay | Admitting: Family Medicine

## 2018-12-05 ENCOUNTER — Ambulatory Visit: Payer: Commercial Managed Care - PPO | Admitting: Family Medicine

## 2018-12-09 ENCOUNTER — Ambulatory Visit: Payer: Commercial Managed Care - PPO | Admitting: Orthopaedic Surgery

## 2018-12-22 ENCOUNTER — Other Ambulatory Visit: Payer: Self-pay

## 2018-12-22 ENCOUNTER — Encounter: Payer: Self-pay | Admitting: Family Medicine

## 2018-12-22 MED ORDER — GLIMEPIRIDE 1 MG PO TABS: ORAL_TABLET | ORAL | 0 refills | Status: DC

## 2019-01-06 ENCOUNTER — Other Ambulatory Visit: Payer: Self-pay | Admitting: Internal Medicine

## 2019-01-06 ENCOUNTER — Ambulatory Visit (INDEPENDENT_AMBULATORY_CARE_PROVIDER_SITE_OTHER): Payer: Commercial Managed Care - PPO

## 2019-01-06 DIAGNOSIS — Z1159 Encounter for screening for other viral diseases: Secondary | ICD-10-CM

## 2019-01-06 LAB — SARS CORONAVIRUS 2 (TAT 6-24 HRS): SARS Coronavirus 2: NEGATIVE

## 2019-01-09 ENCOUNTER — Ambulatory Visit (AMBULATORY_SURGERY_CENTER): Payer: Commercial Managed Care - PPO | Admitting: Internal Medicine

## 2019-01-09 ENCOUNTER — Other Ambulatory Visit: Payer: Self-pay

## 2019-01-09 ENCOUNTER — Encounter: Payer: Self-pay | Admitting: Internal Medicine

## 2019-01-09 VITALS — BP 138/95 | HR 67 | Temp 97.8°F | Resp 21 | Ht 63.0 in | Wt 139.0 lb

## 2019-01-09 DIAGNOSIS — D123 Benign neoplasm of transverse colon: Secondary | ICD-10-CM

## 2019-01-09 DIAGNOSIS — K2970 Gastritis, unspecified, without bleeding: Secondary | ICD-10-CM | POA: Diagnosis not present

## 2019-01-09 DIAGNOSIS — K2950 Unspecified chronic gastritis without bleeding: Secondary | ICD-10-CM

## 2019-01-09 DIAGNOSIS — D128 Benign neoplasm of rectum: Secondary | ICD-10-CM

## 2019-01-09 DIAGNOSIS — K2990 Gastroduodenitis, unspecified, without bleeding: Secondary | ICD-10-CM

## 2019-01-09 DIAGNOSIS — Z860100 Personal history of colon polyps, unspecified: Secondary | ICD-10-CM

## 2019-01-09 DIAGNOSIS — K219 Gastro-esophageal reflux disease without esophagitis: Secondary | ICD-10-CM | POA: Diagnosis not present

## 2019-01-09 DIAGNOSIS — R634 Abnormal weight loss: Secondary | ICD-10-CM | POA: Diagnosis not present

## 2019-01-09 DIAGNOSIS — D124 Benign neoplasm of descending colon: Secondary | ICD-10-CM | POA: Diagnosis not present

## 2019-01-09 MED ORDER — SODIUM CHLORIDE 0.9 % IV SOLN: 500.0000 mL | Freq: Once | INTRAVENOUS | Status: DC

## 2019-01-09 NOTE — Op Note (Signed)
[facility] Patient Name: Stacy [name] Procedure Date: 01/09/2019 11:29 AM MRN: [id number] Endoscopist: Jerene Bears , MD Age: 53 Referring MD:  Date of Birth: 13-Feb-1965 Gender: Female Account #: [id number] Procedure:                Colonoscopy Indications:              High risk colon cancer surveillance: Personal                            history of non-advanced adenoma, Last colonoscopy:                            December 2014 Medicines:                Monitored Anesthesia Care Procedure:                Pre-Anesthesia Assessment:                           - Prior to the procedure, a History and Physical                            was performed, and patient medications and                            allergies were reviewed. The patient's tolerance of                            previous anesthesia was also reviewed. The risks                            and benefits of the procedure and the sedation                            options and risks were discussed with the patient.                            All questions were answered, and informed consent                            was obtained. Prior Anticoagulants: The patient has                            taken no previous anticoagulant or antiplatelet                            agents. ASA Grade Assessment: II - A patient with                            mild systemic disease. After reviewing the risks                            and benefits, the patient was deemed in  satisfactory condition to undergo the procedure.                           After obtaining informed consent, the colonoscope                            was passed under direct vision. Throughout the                            procedure, the patient's blood pressure, pulse, and                            oxygen saturations were monitored continuously. The                            Colonoscope was introduced through the anus and                            advanced to the cecum, identified by appendiceal                            orifice and ileocecal valve. The colonoscopy was                            performed without difficulty. The patient tolerated                            the procedure well. The quality of the bowel                            preparation was good. The ileocecal valve,                            appendiceal orifice, and rectum were photographed. Scope In: 11:39:54 AM Scope Out: 11:54:49 AM Scope Withdrawal Time: 0 hours 11 minutes 16 seconds  Total Procedure Duration: 0 hours 14 minutes 55 seconds  Findings:                 The digital rectal exam was normal.                           A 5 mm polyp was found in the hepatic flexure. The                            polyp was sessile. The polyp was removed with a                            cold snare. Resection and retrieval were complete.                           A 5 mm polyp was found in the descending colon. The                            polyp was sessile.  The polyp was removed with a                            cold snare. Resection and retrieval were complete.                           A 3 mm polyp was found in the rectum. The polyp was                            sessile. The polyp was removed with a cold snare.                            Resection and retrieval were complete.                           Scattered small-mouthed diverticula were found in                            the sigmoid colon, descending colon and ascending                            colon.                           Internal hemorrhoids were found during retroflexion. Complications:            No immediate complications. Estimated Blood Loss:     Estimated blood loss was minimal. Impression:               - One 5 mm polyp at the hepatic flexure, removed                            with a cold snare. Resected and retrieved.                           - One 5 mm polyp in  the descending colon, removed                            with a cold snare. Resected and retrieved.                           - One 3 mm polyp in the rectum, removed with a cold                            snare. Resected and retrieved.                           - Diverticulosis in the sigmoid colon, in the                            descending colon and in the ascending colon.                           - Small internal hemorrhoids. Recommendation:           -  Patient has a contact number available for                            emergencies. The signs and symptoms of potential                            delayed complications were discussed with the                            patient. Return to normal activities tomorrow.                            Written discharge instructions were provided to the                            patient.                           - Resume previous diet.                           - Continue present medications.                           - Await pathology results.                           - Repeat colonoscopy is recommended for                            surveillance. The colonoscopy date will be                            determined after pathology results from today's                            exam become available for review. Jerene Bears, MD 01/09/2019 12:00:15 PM This report has been signed electronically.

## 2019-01-09 NOTE — Op Note (Signed)
[facility] Patient Name: Stacy [name] Procedure Date: 01/09/2019 11:29 AM MRN: [id number] Endoscopist: Jerene Bears , MD Age: 53 Referring MD:  Date of Birth: [birth year] Gender: Female Account #: [id number] Procedure:                Upper GI endoscopy Indications:              Gastro-esophageal reflux disease, Weight loss Medicines:                Monitored Anesthesia Care Procedure:                Pre-Anesthesia Assessment:                           - Prior to the procedure, a History and Physical                            was performed, and patient medications and                            allergies were reviewed. The patient's tolerance of                            previous anesthesia was also reviewed. The risks                            and benefits of the procedure and the sedation                            options and risks were discussed with the patient.                            All questions were answered, and informed consent                            was obtained. Prior Anticoagulants: The patient has                            taken no previous anticoagulant or antiplatelet                            agents. ASA Grade Assessment: II - A patient with                            mild systemic disease. After reviewing the risks                            and benefits, the patient was deemed in                            satisfactory condition to undergo the procedure.                           After obtaining informed consent, the endoscope was  passed under direct vision. Throughout the                            procedure, the patient's blood pressure, pulse, and                            oxygen saturations were monitored continuously. The                            Endoscope was introduced through the mouth, and                            advanced to the second part of duodenum. The upper                            GI  endoscopy was accomplished without difficulty.                            The patient tolerated the procedure well. Scope In: Scope Out: Findings:                 Normal mucosa was found in the lower third of the                            esophagus.                           A 5 cm hiatal hernia was found. The proximal extent                            of the gastric folds (end of tubular esophagus) was                            35 cm from the incisors. The hiatal narrowing was                            40 cm from the incisors.                           Moderate inflammation characterized by adherent                            blood, congestion (edema) and erythema was found in                            the gastric body and in the gastric antrum.                            Biopsies were taken with a cold forceps for                            histology and Helicobacter pylori testing.  The examined duodenum was normal. Complications:            No immediate complications. Estimated Blood Loss:     Estimated blood loss was minimal. Impression:               - Normal mucosa was found in the lower third of the                            esophagus.                           - 5 cm hiatal hernia.                           - Gastritis. Biopsied.                           - Normal examined duodenum. Recommendation:           - Patient has a contact number available for                            emergencies. The signs and symptoms of potential                            delayed complications were discussed with the                            patient. Return to normal activities tomorrow.                            Written discharge instructions were provided to the                            patient.                           - Resume previous diet.                           - Continue present medications.                           - Await pathology results. Jerene Bears, MD 01/09/2019 11:58:08 AM This report has been signed electronically.

## 2019-01-09 NOTE — Patient Instructions (Signed)
YOU HAD AN ENDOSCOPIC PROCEDURE TODAY AT THE [facility]:   Refer to the procedure report that was given to you for any specific questions about what was found during the examination.  If the procedure report does not answer your questions, please call your gastroenterologist to clarify.  If you requested that your care partner not be given the details of your procedure findings, then the procedure report has been included in a sealed envelope for you to review at your convenience later.  YOU SHOULD EXPECT: Some feelings of bloating in the abdomen. Passage of more gas than usual.  Walking can help get rid of the air that was put into your GI tract during the procedure and reduce the bloating. If you had a lower endoscopy (such as a colonoscopy or flexible sigmoidoscopy) you may notice spotting of blood in your stool or on the toilet paper. If you underwent a bowel prep for your procedure, you may not have a normal bowel movement for a few days.  Please Note:  You might notice some irritation and congestion in your nose or some drainage.  This is from the oxygen used during your procedure.  There is no need for concern and it should clear up in a day or so.  SYMPTOMS TO REPORT IMMEDIATELY:   Following lower endoscopy (colonoscopy or flexible sigmoidoscopy):  Excessive amounts of blood in the stool  Significant tenderness or worsening of abdominal pains  Swelling of the abdomen that is new, acute  Fever of 100F or higher   Following upper endoscopy (EGD)  Vomiting of blood or coffee ground material  New chest pain or pain under the shoulder blades  Painful or persistently difficult swallowing  New shortness of breath  Fever of 100F or higher  Black, tarry-looking stools  For urgent or emergent issues, a gastroenterologist can be reached at any hour by calling [phone].   DIET:  We do recommend a small meal at first, but then you may proceed to your regular diet.  Drink  plenty of fluids but you should avoid alcoholic beverages for 24 hours.  ACTIVITY:  You should plan to take it easy for the rest of today and you should NOT DRIVE or use heavy machinery until tomorrow (because of the sedation medicines used during the test).    FOLLOW UP: Our staff will call the number listed on your records 48-72 hours following your procedure to check on you and address any questions or concerns that you may have regarding the information given to you following your procedure. If we do not reach you, we will leave a message.  We will attempt to reach you two times.  During this call, we will ask if you have developed any symptoms of COVID 19. If you develop any symptoms (ie: fever, flu-like symptoms, shortness of breath, cough etc.) before then, please call [phone].  If you test positive for Covid 19 in the 2 weeks post procedure, please call and report this information to us.    If any biopsies were taken you will be contacted by phone or by letter within the next 1-3 weeks.  Please call us at [phone] if you have not heard about the biopsies in 3 weeks.    SIGNATURES/CONFIDENTIALITY: You and/or your care partner have signed paperwork which will be entered into your electronic medical record.  These signatures attest to the fact that that the information above on your After Visit Summary has been reviewed and is   understood.  Full responsibility of the confidentiality of this discharge information lies with you and/or your care-partner.    Handouts were given to your care partner on a hiatal hernia, gastritis, polyps, diverticulosis, and hemorrhoids. Your blood sugar was 102 in the recovery room. You may resume your current medications today. Await biopsy results. Please call if any questions or concerns.

## 2019-01-09 NOTE — Progress Notes (Signed)
Called to room to assist during endoscopic procedure.  Patient ID and intended procedure confirmed with present staff. Received instructions for my participation in the procedure from the performing physician.  

## 2019-01-09 NOTE — Progress Notes (Signed)
To PACU, VSS. Report to [email or web address] 

## 2019-01-09 NOTE — Progress Notes (Signed)
Temp JB VS DT  Pt's states no medical or surgical changes since previsit or office visit. 

## 2019-01-10 ENCOUNTER — Other Ambulatory Visit (INDEPENDENT_AMBULATORY_CARE_PROVIDER_SITE_OTHER): Payer: Commercial Managed Care - PPO

## 2019-01-10 DIAGNOSIS — E782 Mixed hyperlipidemia: Secondary | ICD-10-CM | POA: Diagnosis not present

## 2019-01-10 DIAGNOSIS — E1165 Type 2 diabetes mellitus with hyperglycemia: Secondary | ICD-10-CM

## 2019-01-10 LAB — COMPREHENSIVE METABOLIC PANEL
ALT: 28 U/L (ref 0–35)
AST: 23 U/L (ref 0–37)
Albumin: 4.4 g/dL (ref 3.5–5.2)
Alkaline Phosphatase: 69 U/L (ref 39–117)
BUN: 12 mg/dL (ref 6–23)
CO2: 26 mEq/L (ref 19–32)
Calcium: 9.8 mg/dL (ref 8.4–10.5)
Chloride: 98 mEq/L (ref 96–112)
Creatinine, Ser: 0.72 mg/dL (ref 0.40–1.20)
GFR: 102.02 mL/min (ref >60.00)
Glucose, Bld: 87 mg/dL (ref 70–99)
Potassium: 3 mEq/L — ABNORMAL LOW (ref 3.5–5.1)
Sodium: 136 mEq/L (ref 135–145)
Total Bilirubin: 0.3 mg/dL (ref 0.2–1.2)
Total Protein: 8.2 g/dL (ref 6.0–8.3)

## 2019-01-10 LAB — LIPID PANEL
Cholesterol: 176 mg/dL (ref 0–200)
HDL: 50.5 mg/dL (ref >39.00)
LDL Cholesterol: 97 mg/dL (ref 0–99)
NonHDL: 125.55
Total CHOL/HDL Ratio: 3
Triglycerides: 144 mg/dL (ref 0.0–149.0)
VLDL: 28.8 mg/dL (ref 0.0–40.0)

## 2019-01-10 LAB — HEMOGLOBIN A1C: Hgb A1c MFr Bld: 6.8 % — ABNORMAL HIGH (ref 4.6–6.5)

## 2019-01-11 ENCOUNTER — Encounter: Payer: Self-pay | Admitting: Internal Medicine

## 2019-01-11 ENCOUNTER — Telehealth: Payer: Self-pay | Admitting: *Deleted

## 2019-01-11 ENCOUNTER — Other Ambulatory Visit: Payer: Commercial Managed Care - PPO

## 2019-01-11 NOTE — Telephone Encounter (Signed)
  Follow up Call-  Call back number 01/09/2019  Post procedure Call Back phone  # [phone]  Permission to leave phone message Yes  Some recent data might be hidden     Patient questions:  Message left to call us if necessary.

## 2019-01-16 ENCOUNTER — Ambulatory Visit (INDEPENDENT_AMBULATORY_CARE_PROVIDER_SITE_OTHER): Payer: Commercial Managed Care - PPO | Admitting: Endocrinology

## 2019-01-16 ENCOUNTER — Other Ambulatory Visit: Payer: Self-pay

## 2019-01-16 ENCOUNTER — Encounter: Payer: Self-pay | Admitting: Endocrinology

## 2019-01-16 ENCOUNTER — Telehealth: Payer: Self-pay | Admitting: *Deleted

## 2019-01-16 VITALS — BP 150/90 | HR 88 | Temp 98.1°F | Ht 63.0 in | Wt 137.0 lb

## 2019-01-16 DIAGNOSIS — I10 Essential (primary) hypertension: Secondary | ICD-10-CM | POA: Diagnosis not present

## 2019-01-16 DIAGNOSIS — D3501 Benign neoplasm of right adrenal gland: Secondary | ICD-10-CM

## 2019-01-16 DIAGNOSIS — E1165 Type 2 diabetes mellitus with hyperglycemia: Secondary | ICD-10-CM | POA: Diagnosis not present

## 2019-01-16 DIAGNOSIS — E876 Hypokalemia: Secondary | ICD-10-CM

## 2019-01-16 DIAGNOSIS — E049 Nontoxic goiter, unspecified: Secondary | ICD-10-CM

## 2019-01-16 MED ORDER — AMLODIPINE BESYLATE 5 MG PO TABS: 5.0000 mg | ORAL_TABLET | Freq: Every day | ORAL | 3 refills | Status: DC

## 2019-01-16 NOTE — Patient Instructions (Addendum)
Check blood sugars on waking up 1-2 days a week  Also check blood sugars about 2 hours after meals and do this after different meals by rotation  Recommended blood sugar levels on waking up are 90-130 and about 2 hours after meal is 130-160  Please bring your blood sugar monitor to each visit, thank you  Change HCTZ to amlodipine and stay on Spironoloactone  Reduce Jardiance to 1/2

## 2019-01-16 NOTE — Progress Notes (Signed)
Patient ID: Stacy [name], female   DOB: [birth year], 53 y.o.   MRN: [id number]             Reason for Appointment: Endocrinology follow-up    History of Present Illness:          Date of diagnosis of type 2 diabetes mellitus: 2014        Background history:    She apparently was asymptomatic at the time of diagnosis She was started on metformin but no details of initial management or A1c are available She thinks about 3 years ago she was started on Farxiga also with improved control and no side effects with this She has mostly been followed by a PCP but may have seen an endocrinologist also About 1-1/2-2 years ago she was also given basal insulin with Tyler Aas presumably for higher sugars but not clear what her A1c was at that time  Recent history:    Her A1c was last higher at 7.9 is now 6.8   Non-insulin hypoglycemic drugs the patient is taking are: Jardiance 25 mg daily, metformin 850 mg twice daily, Actos 15 mg daily, Amaryl 2mg  at dinner  Current management, blood sugar patterns and problems identified:  She has started ACTOS in July since she was having relatively high readings and was concerned about losing weight  Her weight in 5/20 was as low as 134 pounds  However she thinks she is still having some tendency to weight loss  She does not check readings after meals and only has a few fasting readings which are near normal and lab glucose was 86 fasting which is improved  She has been regular with her walking schedule  No edema with Actos and also no symptoms of hypoglycemia with continuing glimepiride  She was told to cut her Jardiance 50% but she forgot to do so        Side effects from medications have been: Diarrhea from metformin  Compliance with the medical regimen:  Fair Hypoglycemia: none   Glucose monitoring:  done  1 times a day         Glucometer:  Accu-Chek      Blood Glucose readings by patient recall in the mornings:  94-120  PREVIOUS  morning blood sugar range 132-178 for the last 7 readings  Self-care: The diet that the patient has been following is: tries to limit fried food.            Dietician visit, most recent: None  Weight history:  Wt Readings from Last 3 Encounters:  01/16/19 137 lb (62.1 kg)  01/09/19 139 lb (63 kg)  11/07/18 139 lb (63 kg)    Glycemic control:   Lab Results  Component Value Date   HGBA1C 6.8 (H) 01/10/2019   HGBA1C 7.9 (H) 05/31/2018   HGBA1C 7.2 01/24/2018   Lab Results  Component Value Date   MICROALBUR 24.8 (H) 12/10/2017   LDLCALC 97 01/10/2019   CREATININE 0.72 01/10/2019   Lab Results  Component Value Date   MICRALBCREAT 6.5 12/10/2017    Lab Results  Component Value Date   FRUCTOSAMINE 244 08/02/2018   FRUCTOSAMINE 248 03/11/2018   FRUCTOSAMINE 239 12/10/2017   OTHER active issues discussed are in review of systems   Allergies as of 01/16/2019      Reactions   Almond Meal Anaphylaxis, Swelling   Aspirin Nausea Only      Medication List       Accurate as of January 16, 2019  9:30 AM. If you have any questions, ask your nurse or doctor.        albuterol 108 (90 Base) MCG/ACT inhaler Commonly known as: VENTOLIN HFA Inhale 2 puffs into the lungs every 4 (four) hours as needed for wheezing or shortness of breath.   allopurinol 100 MG tablet Commonly known as: ZYLOPRIM TAKE ONE TABLET BY MOUTH DAILY. START ON 04/28/2017.   atorvastatin 20 MG tablet Commonly known as: LIPITOR TAKE 1 TABLET BY MOUTH EVERY DAY   clotrimazole 1 % cream Commonly known as: LOTRIMIN Apply 1 application topically 2 (two) times daily.   colchicine 0.6 MG tablet Take 1 tablet (0.6 mg total) by mouth daily.   EPINEPHrine 0.3 mg/0.3 mL Soaj injection Commonly known as: EPI-PEN Insert into muscle once with any facial or neck swelling  Instruct in use   FLUoxetine 40 MG capsule Commonly known as: PROZAC TAKE 1 CAPSULE BY MOUTH DAILY.   glimepiride 1 MG  tablet Commonly known as: AMARYL TAKE 1 TABLET BY MOUTH DAILY BEFORE SUPPER.   glucose blood test strip Use as instructed to monitor FSBS 3x daily. Dx: E11.65   hydrochlorothiazide 25 MG tablet Commonly known as: HYDRODIURIL Take 25 mg by mouth as directed. Take 1/2 tablet daily   Jardiance 25 MG Tabs tablet Generic drug: empagliflozin TAKE 1 TABLET BY MOUTH EVERY DAY   Lancets Misc Use as instructed to monitor FSBS 3x daily. Dx: E11.65   metFORMIN 850 MG tablet Commonly known as: Glucophage Take 1 tablet (850 mg total) by mouth 2 (two) times daily with a meal.   One-A-Day [employer] 1 each by mouth daily.   pantoprazole 40 MG tablet Commonly known as: PROTONIX TAKE 1 TABLET BY MOUTH EVERY DAY   pioglitazone 15 MG tablet Commonly known as: Actos Take 1 tablet (15 mg total) by mouth daily.   spironolactone 50 MG tablet Commonly known as: ALDACTONE TAKE 1 TABLET BY MOUTH EVERY DAY   SUMAtriptan 100 MG tablet Commonly known as: Imitrex Take 1 tablet (100 mg total) by mouth every 2 (two) hours as needed for migraine. May repeat in 2 hours if headache persists or recurs.   zolpidem 10 MG tablet Commonly known as: Ambien Take 1 tablet (10 mg total) by mouth at bedtime as needed for sleep.       Allergies:  Allergies  Allergen Reactions  . Almond Meal Anaphylaxis and Swelling  . Aspirin Nausea Only    Past Medical History:  Diagnosis Date  . Adrenal adenoma    as per MRI 03/09/14  . Allergy   . Anxiety   . Coronary artery calcification seen on CAT scan 04/12/2018  . Depression   . Diabetes mellitus   . Diverticulosis   . Elevated LFTs    elevated since 09/2013  . Fatty liver    moderate- as per 03/06/17 MRI liver  . GERD (gastroesophageal reflux disease)   . Gout   . H/O hiatal hernia   . Headache(784.0)   . History of depression   . Hyperlipemia   . Hyperplastic colon polyp   . Hypertension   . Positive hepatitis C antibody test     RNA NEGATIVE  . Thyroid nodule  managed Dr. Posey Pronto 02/27/2014  . Tobacco abuse   . Tubular adenoma of colon     Past Surgical History:  Procedure Laterality Date  . ANAL FISTULECTOMY  01/19/1985  . BREAST EXCISIONAL BIOPSY Left   . BREAST LUMPECTOMY Left 1988  .  PAROTIDECTOMY  03/30/2011   Procedure: PAROTIDECTOMY;  Surgeon: Izora Gala, MD;  Location: [facility];  Service: ENT;  Laterality: Right;  . WRIST GANGLION EXCISION Left 1988    Family History  Problem Relation Age of Onset  . Coronary artery disease Father 49       CAD,CABG  . Hypertension Mother   . Stroke Mother   . Diabetes Sister        uncontrolled  . Cancer Sister 5       uterine cancer  . Aneurysm Sister        brain  . Heart attack Maternal Grandmother   . Blindness Paternal Grandmother   . Breast cancer Neg Hx   . Colon cancer Neg Hx   . Esophageal cancer Neg Hx   . Rectal cancer Neg Hx   . Stomach cancer Neg Hx     Social History:  reports that she has been smoking cigarettes. She has a 28.00 pack-year smoking history. She has never used smokeless tobacco. She reports current alcohol use of about 14.0 standard drinks of alcohol per week. She reports that she does not use drugs.   Review of Systems  Has had a right adrenal adenoma diagnosed initially in 2009 and stable on further imaging subsequently including 01/2018  Lipid history: Managed by PCP with atorvastatin 20 mg     Lab Results  Component Value Date   CHOL 176 01/10/2019   HDL 50.50 01/10/2019   LDLCALC 97 01/10/2019   TRIG 144.0 01/10/2019   CHOLHDL 3 01/10/2019           Hypertension: This has been present for several years Currently on half HCTZ, 50 mg Aldactone  Has been seen by PCP recently but she has not had any medication changes made She has not monitored blood pressure   Because of her hypokalemia she has been on Spironolactone 50 daily Previously potassium has been consistent.  She appears to have run out  of her spironolactone in late November for 2 weeks and has filled her prescription this month but not clear when Potassium is still significantly low   BP Readings from Last 3 Encounters:  01/16/19 (!) 150/90  01/09/19 (!) 138/95  10/03/18 138/76   Lab Results  Component Value Date   K 3.0 (L) 01/10/2019   Lab Results  Component Value Date   CREATININE 0.72 01/10/2019   CREATININE 1.02 10/03/2018   CREATININE 0.87 08/02/2018    Reportedly has fatty liver, last ALT normal  Lab Results  Component Value Date   ALT 28 01/10/2019    GOITER: She has had a goiter and last thyroid levels were normal.  She thinks she may be getting weight loss because of her thyroid Ultrasound showed a stable 7 mm nodule only in the left side, this was done in 12/2014  Lab Results  Component Value Date   TSH 0.70 01/24/2018   TSH 0.57 11/06/2015   TSH 0.492 01/01/2015   FREET4 0.9 11/06/2015   FREET4 1.24 01/01/2015    She is now on Prozac from her PCP for depression and anxiety   LABS:  Lab on 01/10/2019  Component Date Value Ref Range Status  . Cholesterol 01/10/2019 176  0 - 200 mg/dL Final   ATP III Classification       Desirable:  < 200 mg/dL               Borderline High:  200 - 239 mg/dL  High:  > = 240 mg/dL  . Triglycerides 01/10/2019 144.0  0.0 - 149.0 mg/dL Final   Normal:  <150 mg/dLBorderline High:  150 - 199 mg/dL  . HDL 01/10/2019 50.50  >39.00 mg/dL Final  . VLDL 01/10/2019 28.8  0.0 - 40.0 mg/dL Final  . LDL Cholesterol 01/10/2019 97  0 - 99 mg/dL Final  . Total CHOL/HDL Ratio 01/10/2019 3   Final                  Men          Women1/2 Average Risk     3.4          3.3Average Risk          5.0          4.42X Average Risk          9.6          7.13X Average Risk          15.0          11.0                      . NonHDL 01/10/2019 125.55   Final   NOTE:  Non-HDL goal should be 30 mg/dL higher than patient's LDL goal (i.e. LDL goal of < 70 mg/dL, would have non-HDL  goal of < 100 mg/dL)  . Sodium 01/10/2019 136  135 - 145 mEq/L Final  . Potassium 01/10/2019 3.0* 3.5 - 5.1 mEq/L Final  . Chloride 01/10/2019 98  96 - 112 mEq/L Final  . CO2 01/10/2019 26  19 - 32 mEq/L Final  . Glucose, Bld 01/10/2019 87  70 - 99 mg/dL Final  . BUN 01/10/2019 12  6 - 23 mg/dL Final  . Creatinine, Ser 01/10/2019 0.72  0.40 - 1.20 mg/dL Final  . Total Bilirubin 01/10/2019 0.3  0.2 - 1.2 mg/dL Final  . Alkaline Phosphatase 01/10/2019 69  39 - 117 U/L Final  . AST 01/10/2019 23  0 - 37 U/L Final  . ALT 01/10/2019 28  0 - 35 U/L Final  . Total Protein 01/10/2019 8.2  6.0 - 8.3 g/dL Final  . Albumin 01/10/2019 4.4  3.5 - 5.2 g/dL Final  . GFR 01/10/2019 102.02  >60.00 mL/min Final  . Calcium 01/10/2019 9.8  8.4 - 10.5 mg/dL Final  . Hgb A1c MFr Bld 01/10/2019 6.8* 4.6 - 6.5 % Final   Glycemic Control Guidelines for People with Diabetes:Non Diabetic:  <6%Goal of Therapy: <7%Additional Action Suggested:  >8%     Physical Examination:  BP (!) 150/90 (BP Location: Left Arm, Patient Position: Sitting, Cuff Size: Normal)   Pulse 88   Temp 98.1 F (36.7 C) (Oral)   Ht 5\' 3"  (1.6 m)   Wt 137 lb (62.1 kg)   SpO2 99%   BMI 24.27 kg/m    No ankle edema present    ASSESSMENT:  Diabetes type 2, on  metformin, Amaryl and Jardiance  See history of present illness for detailed discussion of current diabetes management, blood sugar patterns and problems identified  Although her A1c was 7.9 previously it is now better at 6.8  She is on a 4 drug regimen  Blood sugars are better with adding Actos 15 mg She does not do readings after meals Overall keeping portions Weight is stable She can do better with exercise however   HYPERTENSION: Blood pressure needs to be better controlled Because of her hypokalemia she  likely has hyperaldosteronism especially with her known adrenal adenoma on the right which has been present for few years  Not clear why her potassium is lower  and she has been missing her Aldactone although not in the last 2 weeks reportedly regularly and she is not clear about her blood pressure readings are Continue HCTZ half tablet and also Aldactone  History of goiter: We will recheck TSH in the next visit  Weight loss: This is questionable as her weight appears to be the same May benefit from reducing Jardiance  PLAN:     Continue 15 mg Actos Reduce Jardiance to half tablet for now Start checking blood sugars after meals consistently and not just in the morning Needs to bring her meter for download on next visit Start walking regularly   Stop HCTZ Consider increasing Aldactone up to 100 mg but first will assess her urine POTASSIUM excretion, given 24-hour collection instructions today  Recheck potassium in 3 weeks Start AMLODIPINE 5 mg daily while stopping HCTZ Encouraged her to check blood pressure regularly if able to at work If her blood pressure is continue to be difficult to control would consider checking aldosterone/renin ratio  We will recheck her thyroid on the next visit  There are no Patient Instructions on file for this visit.   Total visit time for evaluation and management of multiple problems and counseling =25 minutes  Elayne Snare 01/16/2019, 9:30 AM   Note: This office note was prepared with Dragon voice recognition system technology. Any transcriptional errors that result from this process are unintentional.

## 2019-01-16 NOTE — Telephone Encounter (Signed)
Patient has been scheduled for 02/20/19 at 130 pm. I have left a message for patient to call back so I can advise her of this information.

## 2019-01-16 NOTE — Telephone Encounter (Signed)
-----   Message from Larina Bras, [place] sent at 01/10/2019  9:39 AM EST -----  ----- Message ----- From: Jerene Bears, MD Sent: 01/09/2019   5:24 PM EST To: Larina Bras, CMA  Needs OV for LFTs, nonurgent

## 2019-01-17 ENCOUNTER — Telehealth: Payer: Self-pay | Admitting: Cardiovascular Disease

## 2019-01-17 NOTE — Telephone Encounter (Signed)
I have spoken to patient to advise of appointment time and date. She verbalizes understanding of this.

## 2019-01-17 NOTE — Telephone Encounter (Signed)
Spoke to patient . She states she off /on chest pain  ,no radiation of pain no shortness of breath.  Fleeting pain last 3- 4 seconds. patient  Describe it as a dull ache.   Patient has an appointment for tomorrow . If pain becomes worse call 911 Patient verbalized understanding

## 2019-01-17 NOTE — Telephone Encounter (Signed)
Pt c/o of Chest Pain: STAT if CP now or developed within 24 hours  1. Are you having CP right now? Yes   2. Are you experiencing any other symptoms (ex. SOB, nausea, vomiting, sweating)? No   3. How long have you been experiencing CP? Past 2-3 days  4. Is your CP continuous or coming and going? Coming and going   5. Have you taken Nitroglycerin? No   Patient has overdue 6 month f/u scheduled for tomorrow at 2:40 PM. ?

## 2019-01-18 ENCOUNTER — Other Ambulatory Visit: Payer: Self-pay

## 2019-01-18 ENCOUNTER — Encounter: Payer: Self-pay | Admitting: Cardiovascular Disease

## 2019-01-18 ENCOUNTER — Ambulatory Visit (INDEPENDENT_AMBULATORY_CARE_PROVIDER_SITE_OTHER): Payer: Commercial Managed Care - PPO | Admitting: Cardiovascular Disease

## 2019-01-18 VITALS — BP 153/94 | HR 110 | Ht 63.0 in | Wt 134.2 lb

## 2019-01-18 DIAGNOSIS — R072 Precordial pain: Secondary | ICD-10-CM

## 2019-01-18 DIAGNOSIS — I10 Essential (primary) hypertension: Secondary | ICD-10-CM

## 2019-01-18 DIAGNOSIS — R0602 Shortness of breath: Secondary | ICD-10-CM

## 2019-01-18 DIAGNOSIS — Z720 Tobacco use: Secondary | ICD-10-CM

## 2019-01-18 DIAGNOSIS — E7800 Pure hypercholesterolemia, unspecified: Secondary | ICD-10-CM | POA: Diagnosis not present

## 2019-01-18 MED ORDER — IVABRADINE HCL 5 MG PO TABS: 5.0000 mg | ORAL_TABLET | Freq: Once | ORAL | 0 refills | Status: AC

## 2019-01-18 MED ORDER — METOPROLOL TARTRATE 50 MG PO TABS: 100.0000 mg | ORAL_TABLET | Freq: Once | ORAL | 0 refills | Status: DC

## 2019-01-18 NOTE — Patient Instructions (Addendum)
Medication Instructions:  NO CHANGES *If you need a refill on your cardiac medications before your next appointment, please call your pharmacy*  Lab Work: LAB WORK 1 [place] CTA If you have labs (blood work) drawn today and your tests are completely normal, you will receive your results only by: Marland Kitchen MyChart Message (if you have MyChart) OR . A paper copy in the mail If you have any lab test that is abnormal or we need to change your treatment, we will call you to review the results.  Testing/Procedures: Non-Cardiac CT Angiography (CTA), is a special type of CT scan that uses a computer to produce multi-dimensional views of major blood vessels throughout the body. In CT angiography, a contrast material is injected through an IV to help visualize the blood vessels   Follow-Up: At [facility], you and your health needs are our priority.  As part of our continuing mission to provide you with exceptional heart care, we have created designated Provider Care Teams.  These Care Teams include your primary Cardiologist (physician) and Advanced Practice Providers (APPs -  Physician Assistants and Nurse Practitioners) who all work together to provide you with the care you need, when you need it.  Your next appointment:   2 month(s)  The format for your next appointment:   In Person  Provider:   Skeet Latch, MD    Your cardiac CT will be scheduled at one of the below locations:   [facility] [address] [place], [place] [address] [phone]   If scheduled at [facility], please arrive at the [facility] main entrance of [facility] 30-45 minutes prior to test start time. Proceed to the [facility] Radiology Department (first floor) to check-in and test prep.  Please follow these instructions carefully (unless otherwise directed):   On the Night Before the Test: . Be sure to Drink plenty of water. . Do not consume any  caffeinated/decaffeinated beverages or chocolate 12 hours prior to your test. . Do not take any antihistamines 12 hours prior to your test.  On the Day of the Test: . Drink plenty of water. Do not drink any water within one hour of the test. . Do not eat any food 4 hours prior to the test. . You may take your regular medications prior to the test.  . Take metoprolol (Lopressor) 100MG  two hours prior to test. . Take Corlanor 5mg  two hours prior to test. . FEMALES- please wear underwire-free bra if available        After the Test: . Drink plenty of water. . After receiving IV contrast, you may experience a mild flushed feeling. This is normal. . On occasion, you may experience a mild rash up to 24 hours after the test. This is not dangerous. If this occurs, you can take Benadryl 25 mg and increase your fluid intake. . If you experience trouble breathing, this can be serious. If it is severe call 911 IMMEDIATELY. If it is mild, please call our office. . If you take any of these medications: Metformin, please do not take 48 hours after completing test unless otherwise instructed.   Once we have confirmed authorization from your insurance company, we will call you to set up a date and time for your test.   For non-scheduling related questions, please contact the cardiac imaging nurse navigator should you have any questions/concerns: Marchia Bond, RN Navigator Cardiac Imaging [facility] [phone] Office

## 2019-01-18 NOTE — Progress Notes (Signed)
Cardiology Office Note   Date:  01/18/2019   ID:  Stacy [name], Stacy [name] 1965-09-01, MRN [id number]  PCP:  Alycia Rossetti, MD  Cardiologist:   Skeet Latch, MD   No chief complaint on file.    History of Present Illness: ANALLELY Stacy [name] is a 53 y.o. female with coronary calcification, hypertension, diabetes, fatty liver disease, elevated LFTs, and tobacco abuse who presents for follow up.  Ms. Stacy [name] saw Dr. Coralyn Mark and was referred for a CT of the chest, abdomen, and pelvis for unintentional weight loss and tobacco abuse.  On that CT she was noted to have mild to moderate calcified plaque in the thoracic aorta and left anterior descending coronary artery calcification.  Therefore she was referred to cardiology for further evaluation.  Ms. Stacy [name] was feeling well but not physically active.  She had no exertional symptoms so prevention was stressed.  Her husband died unexpectedly of a heart attack a few years ago.  He was the main cook.  She has limited appetite and does not enjoy cooking so she typically eats 1 large meal daily.  Since her last appointment she continues to be stressed from work.   She reports intermittent LUQ pain.  It comes and goes randomly.  There is no associated shortness of breath, nausea or diaphoresis. It occurs at rest and not with exertion, though she doesn't get any exercise.  She has noted that she get short of breath when carrying bags up steps.  She is afraid this represents CAD as her husband died suddenly of a heart attack at 63.  She continues to smoke approximately 10 cigarettes daily. Her BP has been running high lately.  Her PCP recommended switching HCTZ to amlodipine.  She hasn't yet received the prescription.  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   Past Medical History:  Diagnosis Date  . Adrenal adenoma    as per MRI 03/09/14  . Allergy   . Anxiety   . Coronary artery  calcification seen on CAT scan 04/12/2018  . Depression   . Diabetes mellitus   . Diverticulosis   . Elevated LFTs    elevated since 09/2013  . Fatty liver    moderate- as per 03/06/17 MRI liver  . GERD (gastroesophageal reflux disease)   . Gout   . H/O hiatal hernia   . Headache(784.0)   . History of depression   . Hyperlipemia   . Hyperplastic colon polyp   . Hypertension   . Positive hepatitis C antibody test    RNA NEGATIVE  . Thyroid nodule  managed Dr. Posey Pronto 02/27/2014  . Tobacco abuse   . Tubular adenoma of colon     Past Surgical History:  Procedure Laterality Date  . ANAL FISTULECTOMY  01/19/1985  . BREAST EXCISIONAL BIOPSY Left   . BREAST LUMPECTOMY Left 1988  . PAROTIDECTOMY  03/30/2011   Procedure: PAROTIDECTOMY;  Surgeon: Izora Gala, MD;  Location: [facility];  Service: ENT;  Laterality: Right;  . WRIST GANGLION EXCISION Left 1988     Current Outpatient Medications  Medication Sig Dispense Refill  . albuterol (PROVENTIL HFA;VENTOLIN HFA) 108 (90 Base) MCG/ACT inhaler Inhale 2 puffs into the lungs every 4 (four) hours as needed for wheezing or shortness of breath. 1 Inhaler 0  . allopurinol (ZYLOPRIM) 100 MG tablet TAKE ONE TABLET BY MOUTH DAILY. START ON 04/28/2017. 30 tablet 0  .  amLODipine (NORVASC) 5 MG tablet Take 1 tablet (5 mg total) by mouth daily. 30 tablet 3  . atorvastatin (LIPITOR) 20 MG tablet TAKE 1 TABLET BY MOUTH EVERY DAY 90 tablet 3  . clotrimazole (LOTRIMIN) 1 % cream Apply 1 application topically 2 (two) times daily. 30 g 1  . colchicine 0.6 MG tablet Take 1 tablet (0.6 mg total) by mouth daily. 30 tablet 6  . EPINEPHrine 0.3 mg/0.3 mL IJ SOAJ injection Insert into muscle once with any facial or neck swelling  Instruct in use 1 Device 1  . FLUoxetine (PROZAC) 40 MG capsule TAKE 1 CAPSULE BY MOUTH DAILY. 90 capsule 2  . glimepiride (AMARYL) 1 MG tablet TAKE 1 TABLET BY MOUTH DAILY BEFORE SUPPER. 90 tablet 0  . glucose blood test strip  Use as instructed to monitor FSBS 3x daily. Dx: E11.65 200 each 12  . hydrochlorothiazide (HYDRODIURIL) 25 MG tablet Take 25 mg by mouth as directed. Take 1/2 tablet daily    . JARDIANCE 25 MG TABS tablet TAKE 1 TABLET BY MOUTH EVERY DAY 30 tablet 0  . Lancets MISC Use as instructed to monitor FSBS 3x daily. Dx: E11.65 100 each 11  . metFORMIN (GLUCOPHAGE) 850 MG tablet Take 1 tablet (850 mg total) by mouth 2 (two) times daily with a meal. 180 tablet 3  . Multiple Vitamins-Minerals (ONE-A-DAY WOMENS VITACRAVES) CHEW Chew 1 each by mouth daily.    . pantoprazole (PROTONIX) 40 MG tablet TAKE 1 TABLET BY MOUTH EVERY DAY 90 tablet 1  . pioglitazone (ACTOS) 15 MG tablet Take 1 tablet (15 mg total) by mouth daily. 30 tablet 2  . spironolactone (ALDACTONE) 50 MG tablet TAKE 1 TABLET BY MOUTH EVERY DAY 90 tablet 0  . SUMAtriptan (IMITREX) 100 MG tablet Take 1 tablet (100 mg total) by mouth every 2 (two) hours as needed for migraine. May repeat in 2 hours if headache persists or recurs. 9 tablet 0  . zolpidem (AMBIEN) 10 MG tablet Take 1 tablet (10 mg total) by mouth at bedtime as needed for sleep. 30 tablet 2   No current facility-administered medications for this visit.    Allergies:   Almond meal and Aspirin    Social History:  The patient  reports that she has been smoking cigarettes. She has a 28.00 pack-year smoking history. She has never used smokeless tobacco. She reports current alcohol use of about 14.0 standard drinks of alcohol per week. She reports that she does not use drugs.   Family History:  The patient's family history includes Aneurysm in her sister; Blindness in her paternal grandmother; Cancer (age of onset: 41) in her sister; Coronary artery disease (age of onset: 88) in her father; Diabetes in her sister; Heart attack in her maternal grandmother; Hypertension in her mother; Stroke in her mother.    ROS:  Please see the history of present illness.   Otherwise, review of systems  are positive for none.   All other systems are reviewed and negative.    PHYSICAL EXAM: VS:  BP (!) 153/94   Pulse (!) 110   Ht 5\' 3"  (1.6 m)   Wt 134 lb 3.2 oz (60.9 kg)   SpO2 99%   BMI 23.77 kg/m  , BMI Body mass index is 23.77 kg/m. GENERAL:  Well appearing HEENT:  Pupils equal round and reactive, fundi not visualized, oral mucosa unremarkable NECK:  No jugular venous distention, waveform within normal limits, carotid upstroke brisk and symmetric, no bruits, no thyromegaly  LYMPHATICS:  No cervical adenopathy LUNGS:  Clear to auscultation bilaterally HEART:  RRR.  PMI not displaced or sustained,S1 and S2 within normal limits, no S3, no S4, no clicks, no rubs, no murmurs ABD:  Flat, positive bowel sounds normal in frequency in pitch, no bruits, no rebound, no guarding, no midline pulsatile mass, no hepatomegaly, no splenomegaly EXT:  2 plus pulses throughout, no edema, no cyanosis no clubbing SKIN:  No rashes no nodules NEURO:  Cranial nerves II through XII grossly intact, motor grossly intact throughout PSYCH:  Cognitively intact, oriented to person place and time   EKG:  EKG is ordered today. The ekg ordered today demonstrates sinus tachycardia.  Rate 110 bpm.  Biatrial enlargement.   Recent Labs: 01/24/2018: TSH 0.70 10/03/2018: Hemoglobin 12.8; Platelets 429 01/10/2019: ALT 28; BUN 12; Creatinine, Ser 0.72; Potassium 3.0; Sodium 136    Lipid Panel    Component Value Date/Time   CHOL 176 01/10/2019 0918   TRIG 144.0 01/10/2019 0918   HDL 50.50 01/10/2019 0918   CHOLHDL 3 01/10/2019 0918   VLDL 28.8 01/10/2019 0918   LDLCALC 97 01/10/2019 0918      Wt Readings from Last 3 Encounters:  01/18/19 134 lb 3.2 oz (60.9 kg)  01/16/19 137 lb (62.1 kg)  01/09/19 139 lb (63 kg)      ASSESSMENT AND PLAN:  # Coronary and thoracic aorta # Pure hypercholesterolemia: # Shortness of breath: Ms. Druck has calcification of the thoracic aorta and her LAD.  She has exertional  dyspnea.  Her husband died of a heart attack at age 27.  She has risk factors including poorly controlled hypertension and tobacco abuse.  We will get a coronary CT-A.  Continue atorvastatin.  LDL should be <70.  # Hypertension:  BP above goal.  Her antihypertensive regimen was just adjusted and she has not yet made those changes.  Continue amlodipine and spironolactone for now.  # Tobacco abuse: Ms. Cottier is not ready to quit at this time.  Chantix was previously prescribed.     Current medicines are reviewed at length with the patient today.  The patient does not have concerns regarding medicines.  The following changes have been made:  no change  Labs/ tests ordered today include:  No orders of the defined types were placed in this encounter.    Disposition:   FU with Bernhardt Riemenschneider C. Oval Linsey, MD, [facility] in 2 months.      Signed, Somya Jauregui C. Oval Linsey, MD, [facility]  01/18/2019 2:42 PM    [place]

## 2019-01-23 ENCOUNTER — Encounter: Payer: Self-pay | Admitting: Family Medicine

## 2019-01-23 ENCOUNTER — Other Ambulatory Visit: Payer: Self-pay | Admitting: Family Medicine

## 2019-01-23 ENCOUNTER — Other Ambulatory Visit: Payer: Self-pay

## 2019-01-23 ENCOUNTER — Ambulatory Visit (INDEPENDENT_AMBULATORY_CARE_PROVIDER_SITE_OTHER): Payer: Commercial Managed Care - PPO | Admitting: Family Medicine

## 2019-01-23 VITALS — BP 132/84 | HR 82 | Temp 98.2°F | Resp 14 | Ht 63.0 in | Wt 130.0 lb

## 2019-01-23 DIAGNOSIS — E876 Hypokalemia: Secondary | ICD-10-CM

## 2019-01-23 DIAGNOSIS — R634 Abnormal weight loss: Secondary | ICD-10-CM | POA: Diagnosis not present

## 2019-01-23 DIAGNOSIS — I10 Essential (primary) hypertension: Secondary | ICD-10-CM | POA: Diagnosis not present

## 2019-01-23 DIAGNOSIS — M6780 Other specified disorders of synovium and tendon, unspecified site: Secondary | ICD-10-CM | POA: Diagnosis not present

## 2019-01-23 DIAGNOSIS — E1169 Type 2 diabetes mellitus with other specified complication: Secondary | ICD-10-CM | POA: Diagnosis not present

## 2019-01-23 MED ORDER — PNEUMOVAX 23 25 MCG/0.5ML IJ INJ: 0.5000 mL | INJECTION | Freq: Once | INTRAMUSCULAR | 0 refills | Status: AC

## 2019-01-23 MED ORDER — MIRTAZAPINE 15 MG PO TABS: ORAL_TABLET | ORAL | 1 refills | Status: DC

## 2019-01-23 MED ORDER — ZOLPIDEM TARTRATE 10 MG PO TABS: 10.0000 mg | ORAL_TABLET | Freq: Every evening | ORAL | 2 refills | Status: DC | PRN

## 2019-01-23 NOTE — Progress Notes (Signed)
Subjective:    Patient ID: Stacy [name], female    DOB: 1965/06/14, 54 y.o.   MRN: [id number]  Patient presents for Follow-up (had labs on 12/22 at Dr. Fredna Dow), Knot to L Elbow (fat pad/ squishy and movable), and Weight (unable to gain weight)   Pt here to f/u chronic medical problems She is flollowed by Dr. [name] for her diabetes her A1C was down to  6.8%  recent adjustments    Hyperlipidemia- LDL down to 97, TG normal 144   Hypokalemia- potassium was 3.0 but normal renal function at 0.72  She is worried about her weight. Last visit weight was  136lbs in Sept, weight today down 6lbs She has tried ensure/glucerna but it made her have BM In mornings very busy getting ready for work, so often skips breakfast Lunch biggest meal, very hungry at that time Evening she eats pecan clusters, fruit, veggies/vinegar Dinner eats very small meal- salad or yogurt because she is not hungry Then in middle of the night is very hungry She was interested in something to help stimulate her appetite   Takes prozac in morning , ambien at bedtime for sleep     Review Of Systems:  GEN- denies fatigue, fever, weight loss,weakness, recent illness HEENT- denies eye drainage, change in vision, nasal discharge, CVS- denies chest pain, palpitations RESP- denies SOB, cough, wheeze ABD- denies N/V, change in stools, abd pain GU- denies dysuria, hematuria, dribbling, incontinence MSK- denies joint pain, muscle aches, injury Neuro- denies headache, dizziness, syncope, seizure activity       Objective:    BP 132/84   Pulse 82   Temp 98.2 F (36.8 C) (Temporal)   Resp 14   Ht 5\' 3"  (1.6 m)   Wt 130 lb (59 kg)   SpO2 98%   BMI 23.03 kg/m  GEN- NAD, alert and oriented x3 HEENT- PERRL, EOMI, non injected sclera, pink conjunctiva, MMM, oropharynx clear Neck- Supple, no thyromegaly CVS- RRR, no murmur RESP-CTAB ABD-NABS,soft,NT,ND EXT- No edema Pulses- Radial, DP- 2+ Left elbow- pea size cyst  mobile, NT      Assessment & Plan:      Problem List Items Addressed This Visit      Unprioritized   Hypertension    Controlled no changes       Type 2 diabetes mellitus with other specified complication (HCC)    No longer on insulin She reviewed meds at home, has not been on actos A1C is at goal, reviewed recent labs in detail with her She will f/u with her endocrinologist She had recent reduction in jardiance as well       Other Visit Diagnoses    Tendon cysts    -  Primary   Ganglion cyst at left elbow, benign, very small, if it enlarges can be removed, will monitor for now   Unintentional weight loss       Weight has fluctuated up and down 10 lbs over the past 2-3 years. weight is down 6lbs since our last visit. Trial of remeron for appetite, given 7.5mg  at bedtime Advised to take remeron first, if cant sleep 1-2 hours later can take her Lorrin Mais She needs to also increae overall calories, skips breakfast though hungry at that time  With regards to cancer workup, due for mammogram, so she will have this done   Hypokalemia       Off HCTZ, plan per endo, increase aldactone if still present at recheck      Note:  This dictation was prepared with Dragon dictation along with smaller phrase technology. Any transcriptional errors that result from this process are unintentional.

## 2019-01-23 NOTE — Patient Instructions (Addendum)
F/U 1 month for weight check  F/U 4 months routine visit  Check on the actos Stay off HCTZ  Mammogram schedule  Take the remeron first 7.5mg  around 8pm  Take only 1/2 tablet of ambien ( 5mg ) if needed between 10-11pm if you cant sleep

## 2019-01-23 NOTE — Telephone Encounter (Signed)
Ok to refill??  Last office visit 01/23/2019.  Last refill 10/03/2018, #2 refills.

## 2019-01-24 ENCOUNTER — Encounter: Payer: Self-pay | Admitting: Family Medicine

## 2019-01-24 NOTE — Assessment & Plan Note (Signed)
Controlled no changes 

## 2019-01-24 NOTE — Assessment & Plan Note (Signed)
No longer on insulin She reviewed meds at home, has not been on actos A1C is at goal, reviewed recent labs in detail with her She will f/u with her endocrinologist She had recent reduction in jardiance as well

## 2019-01-27 ENCOUNTER — Encounter: Payer: Self-pay | Admitting: Cardiovascular Disease

## 2019-02-07 ENCOUNTER — Other Ambulatory Visit: Payer: Commercial Managed Care - PPO

## 2019-02-13 ENCOUNTER — Ambulatory Visit: Payer: Commercial Managed Care - PPO | Admitting: Endocrinology

## 2019-02-13 NOTE — Progress Notes (Deleted)
Patient ID: Stacy [name], female   DOB: Mar 23, 1965, 54 y.o.   MRN: [id number]             Reason for Appointment: Endocrinology follow-up    History of Present Illness:          Date of diagnosis of type 2 diabetes mellitus: 2014        Background history:    She apparently was asymptomatic at the time of diagnosis She was started on metformin but no details of initial management or A1c are available She thinks about 3 years ago she was started on Farxiga also with improved control and no side effects with this She has mostly been followed by a PCP but may have seen an endocrinologist also About 1-1/2-2 years ago she was also given basal insulin with Tyler Aas presumably for higher sugars but not clear what her A1c was at that time  Recent history:    Her A1c was last higher at 7.9 is now 6.8   Non-insulin hypoglycemic drugs the patient is taking are: Jardiance 25 mg daily, metformin 850 mg twice daily, Actos 15 mg daily, Amaryl 2mg  at dinner  Current management, blood sugar patterns and problems identified:  She has started ACTOS in July since she was having relatively high readings and was concerned about losing weight  Her weight in 5/20 was as low as 134 pounds  However she thinks she is still having some tendency to weight loss  She does not check readings after meals and only has a few fasting readings which are near normal and lab glucose was 86 fasting which is improved  She has been regular with her walking schedule  No edema with Actos and also no symptoms of hypoglycemia with continuing glimepiride  She was told to cut her Jardiance 50% but she forgot to do so        Side effects from medications have been: Diarrhea from metformin  Compliance with the medical regimen:  Fair Hypoglycemia: none   Glucose monitoring:  done  1 times a day         Glucometer:  Accu-Chek      Blood Glucose readings by patient recall in the mornings:  94-120  PREVIOUS  morning blood sugar range 132-178 for the last 7 readings  Self-care: The diet that the patient has been following is: tries to limit fried food.            Dietician visit, most recent: None  Weight history:  Wt Readings from Last 3 Encounters:  01/23/19 130 lb (59 kg)  01/18/19 134 lb 3.2 oz (60.9 kg)  01/16/19 137 lb (62.1 kg)    Glycemic control:   Lab Results  Component Value Date   HGBA1C 6.8 (H) 01/10/2019   HGBA1C 7.9 (H) 05/31/2018   HGBA1C 7.2 01/24/2018   Lab Results  Component Value Date   MICROALBUR 24.8 (H) 12/10/2017   LDLCALC 97 01/10/2019   CREATININE 0.72 01/10/2019   Lab Results  Component Value Date   MICRALBCREAT 6.5 12/10/2017    Lab Results  Component Value Date   FRUCTOSAMINE 244 08/02/2018   FRUCTOSAMINE 248 03/11/2018   FRUCTOSAMINE 239 12/10/2017   OTHER active issues discussed are in review of systems   Allergies as of 02/13/2019      Reactions   Almond Meal Anaphylaxis, Swelling   Aspirin Nausea Only      Medication List       Accurate as of February 13, 2019  8:20 AM. If you have any questions, ask your nurse or doctor.        albuterol 108 (90 Base) MCG/ACT inhaler Commonly known as: VENTOLIN HFA Inhale 2 puffs into the lungs every 4 (four) hours as needed for wheezing or shortness of breath.   allopurinol 100 MG tablet Commonly known as: ZYLOPRIM TAKE ONE TABLET BY MOUTH DAILY. START ON 04/28/2017.   amLODipine 5 MG tablet Commonly known as: NORVASC Take 1 tablet (5 mg total) by mouth daily.   atorvastatin 20 MG tablet Commonly known as: LIPITOR TAKE 1 TABLET BY MOUTH EVERY DAY   clotrimazole 1 % cream Commonly known as: LOTRIMIN Apply 1 application topically 2 (two) times daily.   colchicine 0.6 MG tablet Take 1 tablet (0.6 mg total) by mouth daily.   EPINEPHrine 0.3 mg/0.3 mL Soaj injection Commonly known as: EPI-PEN Insert into muscle once with any facial or neck swelling  Instruct in use   FLUoxetine  40 MG capsule Commonly known as: PROZAC TAKE 1 CAPSULE BY MOUTH DAILY.   glimepiride 1 MG tablet Commonly known as: AMARYL TAKE 1 TABLET BY MOUTH DAILY BEFORE SUPPER.   glucose blood test strip Use as instructed to monitor FSBS 3x daily. Dx: E11.65   Jardiance 25 MG Tabs tablet Generic drug: empagliflozin TAKE 1 TABLET BY MOUTH EVERY DAY   Lancets Misc Use as instructed to monitor FSBS 3x daily. Dx: E11.65   metFORMIN 850 MG tablet Commonly known as: Glucophage Take 1 tablet (850 mg total) by mouth 2 (two) times daily with a meal.   mirtazapine 15 MG tablet Commonly known as: Remeron Take 1/2 tablet at bedtime   One-A-Day [employer] 1 each by mouth daily.   pantoprazole 40 MG tablet Commonly known as: PROTONIX TAKE 1 TABLET BY MOUTH EVERY DAY   pioglitazone 15 MG tablet Commonly known as: Actos Take 1 tablet (15 mg total) by mouth daily.   spironolactone 50 MG tablet Commonly known as: ALDACTONE TAKE 1 TABLET BY MOUTH EVERY DAY   SUMAtriptan 100 MG tablet Commonly known as: Imitrex Take 1 tablet (100 mg total) by mouth every 2 (two) hours as needed for migraine. May repeat in 2 hours if headache persists or recurs.   zolpidem 10 MG tablet Commonly known as: Ambien Take 1 tablet (10 mg total) by mouth at bedtime as needed for sleep.       Allergies:  Allergies  Allergen Reactions  . Almond Meal Anaphylaxis and Swelling  . Aspirin Nausea Only    Past Medical History:  Diagnosis Date  . Adrenal adenoma    as per MRI 03/09/14  . Allergy   . Anxiety   . Coronary artery calcification seen on CAT scan 04/12/2018  . Depression   . Diabetes mellitus   . Diverticulosis   . Elevated LFTs    elevated since 09/2013  . Fatty liver    moderate- as per 03/06/17 MRI liver  . GERD (gastroesophageal reflux disease)   . Gout   . H/O hiatal hernia   . Headache(784.0)   . History of depression   . Hyperlipemia   . Hyperplastic colon polyp   .  Hypertension   . Positive hepatitis C antibody test    RNA NEGATIVE  . Thyroid nodule  managed Dr. Posey Pronto 02/27/2014  . Tobacco abuse   . Tubular adenoma of colon     Past Surgical History:  Procedure Laterality Date  . ANAL FISTULECTOMY  01/19/1985  .  BREAST EXCISIONAL BIOPSY Left   . BREAST LUMPECTOMY Left 1988  . PAROTIDECTOMY  03/30/2011   Procedure: PAROTIDECTOMY;  Surgeon: Izora Gala, MD;  Location: [facility];  Service: ENT;  Laterality: Right;  . WRIST GANGLION EXCISION Left 1988    Family History  Problem Relation Age of Onset  . Coronary artery disease Father 109       CAD,CABG  . Hypertension Mother   . Stroke Mother   . Diabetes Sister        uncontrolled  . Cancer Sister 29       uterine cancer  . Aneurysm Sister        brain  . Heart attack Maternal Grandmother   . Blindness Paternal Grandmother   . Breast cancer Neg Hx   . Colon cancer Neg Hx   . Esophageal cancer Neg Hx   . Rectal cancer Neg Hx   . Stomach cancer Neg Hx     Social History:  reports that she has been smoking cigarettes. She has a 28.00 pack-year smoking history. She has never used smokeless tobacco. She reports current alcohol use of about 14.0 standard drinks of alcohol per week. She reports that she does not use drugs.   Review of Systems  Has had a right adrenal adenoma diagnosed initially in 2009 and stable on further imaging subsequently including 01/2018  Lipid history: Managed by PCP with atorvastatin 20 mg     Lab Results  Component Value Date   CHOL 176 01/10/2019   HDL 50.50 01/10/2019   LDLCALC 97 01/10/2019   TRIG 144.0 01/10/2019   CHOLHDL 3 01/10/2019           Hypertension: This has been present for several years Currently on half HCTZ, 50 mg Aldactone  Has been seen by PCP recently but she has not had any medication changes made She has not monitored blood pressure   Because of her hypokalemia she has been on Spironolactone 50 daily Previously  potassium has been consistent.  She appears to have run out of her spironolactone in late November for 2 weeks and has filled her prescription this month but not clear when Potassium is still significantly low   BP Readings from Last 3 Encounters:  01/23/19 132/84  01/18/19 (!) 153/94  01/16/19 (!) 150/90   Lab Results  Component Value Date   K 3.0 (L) 01/10/2019   Lab Results  Component Value Date   CREATININE 0.72 01/10/2019   CREATININE 1.02 10/03/2018   CREATININE 0.87 08/02/2018    Reportedly has fatty liver, last ALT normal  Lab Results  Component Value Date   ALT 28 01/10/2019    GOITER: She has had a goiter and last thyroid levels were normal.  She thinks she may be getting weight loss because of her thyroid Ultrasound showed a stable 7 mm nodule only in the left side, this was done in 12/2014  Lab Results  Component Value Date   TSH 0.70 01/24/2018   TSH 0.57 11/06/2015   TSH 0.492 01/01/2015   FREET4 0.9 11/06/2015   FREET4 1.24 01/01/2015    She is now on Prozac from her PCP for depression and anxiety   LABS:  No visits with results within 1 Week(s) from this visit.  Latest known visit with results is:  Lab on 01/10/2019  Component Date Value Ref Range Status  . Cholesterol 01/10/2019 176  0 - 200 mg/dL Final   ATP III Classification  Desirable:  < 200 mg/dL               Borderline High:  200 - 239 mg/dL          High:  > = 240 mg/dL  . Triglycerides 01/10/2019 144.0  0.0 - 149.0 mg/dL Final   Normal:  <150 mg/dLBorderline High:  150 - 199 mg/dL  . HDL 01/10/2019 50.50  >39.00 mg/dL Final  . VLDL 01/10/2019 28.8  0.0 - 40.0 mg/dL Final  . LDL Cholesterol 01/10/2019 97  0 - 99 mg/dL Final  . Total CHOL/HDL Ratio 01/10/2019 3   Final                  Men          Women1/2 Average Risk     3.4          3.3Average Risk          5.0          4.42X Average Risk          9.6          7.13X Average Risk          15.0          11.0                        . NonHDL 01/10/2019 125.55   Final   NOTE:  Non-HDL goal should be 30 mg/dL higher than patient's LDL goal (i.e. LDL goal of < 70 mg/dL, would have non-HDL goal of < 100 mg/dL)  . Sodium 01/10/2019 136  135 - 145 mEq/L Final  . Potassium 01/10/2019 3.0* 3.5 - 5.1 mEq/L Final  . Chloride 01/10/2019 98  96 - 112 mEq/L Final  . CO2 01/10/2019 26  19 - 32 mEq/L Final  . Glucose, Bld 01/10/2019 87  70 - 99 mg/dL Final  . BUN 01/10/2019 12  6 - 23 mg/dL Final  . Creatinine, Ser 01/10/2019 0.72  0.40 - 1.20 mg/dL Final  . Total Bilirubin 01/10/2019 0.3  0.2 - 1.2 mg/dL Final  . Alkaline Phosphatase 01/10/2019 69  39 - 117 U/L Final  . AST 01/10/2019 23  0 - 37 U/L Final  . ALT 01/10/2019 28  0 - 35 U/L Final  . Total Protein 01/10/2019 8.2  6.0 - 8.3 g/dL Final  . Albumin 01/10/2019 4.4  3.5 - 5.2 g/dL Final  . GFR 01/10/2019 102.02  >60.00 mL/min Final  . Calcium 01/10/2019 9.8  8.4 - 10.5 mg/dL Final  . Hgb A1c MFr Bld 01/10/2019 6.8* 4.6 - 6.5 % Final   Glycemic Control Guidelines for People with Diabetes:Non Diabetic:  <6%Goal of Therapy: <7%Additional Action Suggested:  >8%     Physical Examination:  There were no vitals taken for this visit.   No ankle edema present    ASSESSMENT:  Diabetes type 2, on  metformin, Amaryl and Jardiance  See history of present illness for detailed discussion of current diabetes management, blood sugar patterns and problems identified  Although her A1c was 7.9 previously it is now better at 6.8  She is on a 4 drug regimen  Blood sugars are better with adding Actos 15 mg She does not do readings after meals Overall keeping portions Weight is stable She can do better with exercise however   HYPERTENSION: Blood pressure needs to be better controlled Because of her hypokalemia she likely has hyperaldosteronism especially with  her known adrenal adenoma on the right which has been present for few years  Not clear why her potassium is lower and  she has been missing her Aldactone although not in the last 2 weeks reportedly regularly and she is not clear about her blood pressure readings are Continue HCTZ half tablet and also Aldactone  History of goiter: We will recheck TSH in the next visit  Weight loss: This is questionable as her weight appears to be the same May benefit from reducing Jardiance  PLAN:     Continue 15 mg Actos Reduce Jardiance to half tablet for now Start checking blood sugars after meals consistently and not just in the morning Needs to bring her meter for download on next visit Start walking regularly   Stop HCTZ Consider increasing Aldactone up to 100 mg but first will assess her urine POTASSIUM excretion, given 24-hour collection instructions today  Recheck potassium in 3 weeks Start AMLODIPINE 5 mg daily while stopping HCTZ Encouraged her to check blood pressure regularly if able to at work If her blood pressure is continue to be difficult to control would consider checking aldosterone/renin ratio  We will recheck her thyroid on the next visit  There are no Patient Instructions on file for this visit.   Total visit time for evaluation and management of multiple problems and counseling =25 minutes  Elayne Snare 02/13/2019, 8:20 AM   Note: This office note was prepared with Dragon voice recognition system technology. Any transcriptional errors that result from this process are unintentional.

## 2019-02-14 ENCOUNTER — Other Ambulatory Visit: Payer: Self-pay | Admitting: Family Medicine

## 2019-02-20 ENCOUNTER — Ambulatory Visit (INDEPENDENT_AMBULATORY_CARE_PROVIDER_SITE_OTHER): Payer: Commercial Managed Care - PPO | Admitting: Internal Medicine

## 2019-02-20 ENCOUNTER — Encounter: Payer: Self-pay | Admitting: Internal Medicine

## 2019-02-20 VITALS — BP 132/74 | HR 76 | Temp 97.8°F | Ht 63.0 in | Wt 138.0 lb

## 2019-02-20 DIAGNOSIS — F1011 Alcohol abuse, in remission: Secondary | ICD-10-CM | POA: Diagnosis not present

## 2019-02-20 DIAGNOSIS — K449 Diaphragmatic hernia without obstruction or gangrene: Secondary | ICD-10-CM

## 2019-02-20 DIAGNOSIS — K219 Gastro-esophageal reflux disease without esophagitis: Secondary | ICD-10-CM | POA: Diagnosis not present

## 2019-02-20 DIAGNOSIS — K760 Fatty (change of) liver, not elsewhere classified: Secondary | ICD-10-CM

## 2019-02-20 NOTE — Patient Instructions (Signed)
If you are age 55 or older, your body mass index should be between 23-30. Your Body mass index is 24.45 kg/m. If this is out of the aforementioned range listed, please consider follow up with your Primary Care Provider.  If you are age 29 or younger, your body mass index should be between 19-25. Your Body mass index is 24.45 kg/m. If this is out of the aformentioned range listed, please consider follow up with your Primary Care Provider.    Continue taking Pantoprazole 40mg  as directed.   Try to reduce alcohol intake.  Follow-up in 1 year.   Thank you for choosing me and [facility].  Dr. Hilarie Fredrickson

## 2019-02-20 NOTE — Progress Notes (Signed)
Subjective:    Patient ID: Stacy [name], female    DOB: 14-Dec-1965, 54 y.o.   MRN: [id number]  HPI Stacy [name] is a 54 year old female with a history of adenomatous polyps, GERD with hiatal hernia, gastritis, colonic diverticulosis, elevated liver enzymes in the setting of alcohol use/history of abuse, hypertension, hyperlipidemia diabetes who is here for follow-up.  She was last seen in the office in May 2019 but for upper endoscopy and colonoscopy in December 2020.  She is here alone today.  We performed an upper endoscopy and colonoscopy on 01/09/2019.  She had weight loss but also exacerbation of GERD.  Polyp surveillance EGD showed 5 cm hiatal hernia.  Moderate gastritis which was biopsied.  H. pylori negative. Colonoscopy 3 polyps ranging from 3 to 5 mm in size found to be tubular adenoma, diverticulosis in the ascending colon, descending colon and sigmoid and internal hemorrhoids  She reports that she has been feeling well.  She had lost weight and increased her alcohol use again last year.  She associated this with increased stress due to the COVID-19 pandemic.  She at the time of her last visit with me he had been drinking 1 drink every 2 to 3 days but this increased to about 3 drinks per day.  She was drinking predominantly liquor.  She is now reduced this back to 1 drink approximately every other day.  She is drinking more socially now and less drinking alone.  She is also improved her overall diet and has been able to gain some of the weight that she lost back.  She is eating a more balanced diet 3 meals a day having previously been skipping breakfast and dinner most days.  She has some reflux but for the most part pantoprazole works well.  No dysphagia.  Normal bowel habits.  Earlier this year she was having more issues with gout predominantly in her great toe which was quite painful and it attributed to her alcohol use.  She has not gone to any AA meetings.   Review of Systems As  per HPI, otherwise negative  Current Medications, Allergies, Past Medical History, Past Surgical History, Family History and Social History were reviewed in [employer] record.     Objective:   Physical Exam BP 132/74   Pulse 76   Temp 97.8 F (36.6 C)   Ht 5\' 3"  (1.6 m)   Wt 138 lb (62.6 kg)   BMI 24.45 kg/m  Gen: awake, alert, NAD HEENT: anicteric, op clear CV: RRR, no mrg Pulm: CTA b/l Abd: soft, NT/ND, +BS throughout Ext: no c/c/e Neuro: nonfocal  CMP     Component Value Date/Time   NA 136 01/10/2019 0918   NA 140 12/10/2017 1351   K 3.0 (L) 01/10/2019 0918   CL 98 01/10/2019 0918   CO2 26 01/10/2019 0918   GLUCOSE 87 01/10/2019 0918   BUN 12 01/10/2019 0918   BUN 15 12/10/2017 1351   CREATININE 0.72 01/10/2019 0918   CREATININE 1.02 10/03/2018 1511   CALCIUM 9.8 01/10/2019 0918   PROT 8.2 01/10/2019 0918   ALBUMIN 4.4 01/10/2019 0918   AST 23 01/10/2019 0918   ALT 28 01/10/2019 0918   ALKPHOS 69 01/10/2019 0918   BILITOT 0.3 01/10/2019 0918   GFRNONAA 100 12/10/2017 1351   GFRNONAA 67 02/01/2014 1401   GFRAA 115 12/10/2017 1351   GFRAA 77 02/01/2014 1401   CBC    Component Value Date/Time   WBC 10.3  10/03/2018 1511   RBC 4.56 10/03/2018 1511   HGB 12.8 10/03/2018 1511   HCT 38.3 10/03/2018 1511   PLT 429 (H) 10/03/2018 1511   MCV 84.0 10/03/2018 1511   MCH 28.1 10/03/2018 1511   MCHC 33.4 10/03/2018 1511   RDW 15.6 (H) 10/03/2018 1511   LYMPHSABS 3,245 10/03/2018 1511   MONOABS 0.5 05/19/2017 1531   EOSABS 185 10/03/2018 1511   BASOSABS 62 10/03/2018 1511   Lab Results  Component Value Date   INR 1.0 05/19/2017   INR 1.0 02/24/2017   INR 0.95 02/12/2014    CT ABDOMEN PELVIS FINDINGS   Hepatobiliary: Borderline to mild chronic patent steatosis. Otherwise negative liver and gallbladder.   Pancreas: Negative.   Spleen: Negative; a tiny low-density area at the superior pole of the spleen is unchanged since 2008  and benign.   Adrenals/Urinary Tract: Chronic round right adrenal adenoma has mildly increased in size since 2008, now 35 millimeters diameter (28 millimeters on the prior CT). Normal left adrenal.   Bilateral renal enhancement and contrast excretion is symmetric and within normal limits. There is a tiny chronic right renal midpole cyst. Normal proximal ureters. Chronic pelvic phleboliths. No urinary calculus identified. Unremarkable urinary bladder.   Stomach/Bowel: Oral contrast has reached the rectum without obstruction. Negative rectosigmoid colon. Negative descending colon. Mildly redundant transverse colon and bilateral flexures. Mild diverticulosis of the right colon but no active inflammation (series 2, image 80). The cecum is in the pelvis. Normal appendix (coronal image 99). Negative terminal ileum.   No dilated or abnormal small bowel. Negative intraabdominal stomach and duodenum. No free air, free fluid.   Vascular/Lymphatic: Aortoiliac calcified atherosclerosis. Major arterial structures in the abdomen and pelvis are patent. Portal venous system is patent.   No lymphadenopathy.   Reproductive: Negative.   Other: No pelvic free fluid.    Musculoskeletal: Moderate to severe facet degeneration at the lumbosacral junction greater on the left and progressed since 2008. No acute osseous abnormality identified.   IMPRESSION: 1. No acute or malignant process identified in the chest, abdomen, or pelvis. 2. Emphysema (ICD10-J43.9) and Aortic Atherosclerosis (ICD10-I70.0). 3. Mild thyromegaly. Moderate size gastric hiatal hernia. Mild diverticulosis of the ascending colon. 4. Benign right adrenal adenoma has mildly enlarged since 2008.     Electronically Signed   By: Genevie Ann M.D.   On: 02/17/2018 08:51       Assessment & Plan:  54 year old female with a history of adenomatous polyps, GERD with hiatal hernia, gastritis, colonic diverticulosis, elevated liver  enzymes in the setting of alcohol use/history of abuse, hypertension, hyperlipidemia diabetes who is here for follow-up.  1.  History of elevated liver enzymes/fatty liver/history of alcohol abuse --previous elastography had showed fibrosis F2 with some F3.  We discussed this again today.  Fortunately her liver enzymes were normal when checked last month and her platelets are not low.  I do not think she has cirrhosis or advanced liver disease.  We discussed this at length today and it will be important that she try to reduce alcohol completely.  Certainly she is cut this back most recently and this will be quite helpful.  I discussed that if possible she should try to completely cease all alcohol use.  She is open to the possibility of [place] meetings.  We will follow this going forward and I will see her in about a year --Continue to work on alcohol cessation --Recent liver enzymes normal  2.  GERD/hiatal hernia/gastritis --H.  pylori negative gastritis by EGD last month.  Her GERD is certainly exacerbated by her rather large hiatal hernia.  She will remain on pantoprazole 40 mg daily.  She should avoid NSAIDs when possible.  3.  History of colon polyps --adenomatous, recall colonoscopy 5 years which would be December 2025  30 minutes total spent today including patient facing time, coordination of care, reviewing medical history/procedures/pertinent radiology studies, and documentation of the encounter.

## 2019-02-24 ENCOUNTER — Telehealth: Payer: Self-pay | Admitting: *Deleted

## 2019-02-24 NOTE — Telephone Encounter (Signed)
-----   Message ----- From: Sherrie Mustache, RN Sent: 02/20/2019  10:13 AM EST To: Earvin Hansen, LPN Subject: FW: Cardiac ct                                 FYI this is a Dr. Oval Linsey patient. ----- Message ----- From: Howie Ill Sent: 02/20/2019   9:59 AM EST To: Sherrie Mustache, RN Subject: Cardiac ct                                     Good morning Kayla   I am mailing 10 day letter to this patient, we have been trying to reach her for Cardiac ct with no response back. If no response back by 2/15 we will delete order.  Thank you Romie Minus

## 2019-02-28 ENCOUNTER — Other Ambulatory Visit: Payer: Self-pay

## 2019-02-28 ENCOUNTER — Encounter: Payer: Self-pay | Admitting: Family Medicine

## 2019-02-28 ENCOUNTER — Ambulatory Visit (INDEPENDENT_AMBULATORY_CARE_PROVIDER_SITE_OTHER): Payer: Commercial Managed Care - PPO | Admitting: Family Medicine

## 2019-02-28 VITALS — BP 138/78 | HR 82 | Temp 98.7°F | Resp 14 | Ht 63.0 in | Wt 140.0 lb

## 2019-02-28 DIAGNOSIS — R634 Abnormal weight loss: Secondary | ICD-10-CM

## 2019-02-28 DIAGNOSIS — I10 Essential (primary) hypertension: Secondary | ICD-10-CM

## 2019-02-28 DIAGNOSIS — G4700 Insomnia, unspecified: Secondary | ICD-10-CM | POA: Diagnosis not present

## 2019-02-28 DIAGNOSIS — M10062 Idiopathic gout, left knee: Secondary | ICD-10-CM

## 2019-02-28 MED ORDER — COLCHICINE 0.6 MG PO TABS: 0.6000 mg | ORAL_TABLET | Freq: Every day | ORAL | 6 refills | Status: DC

## 2019-02-28 MED ORDER — ALLOPURINOL 100 MG PO TABS: ORAL_TABLET | ORAL | 6 refills | Status: DC

## 2019-02-28 NOTE — Patient Instructions (Addendum)
F/U 4 months  Take the allopurinol at  50mg  once a day  Schedule with GYN Schedule with Eye doctor

## 2019-02-28 NOTE — Progress Notes (Signed)
Subjective:    Patient ID: Stacy [name], female    DOB: January 06, 1966, 54 y.o.   MRN: [id number]  Patient presents for Follow-up (is fasting) and R Ankle Pain  Patient here for interim follow-up on her weight.  Her last visit she complained of unintentional weight loss.  She been working with her endocrinologist.  Her weight has been fluctuating 10 pounds up and down over the past 2 to 3 years but at her last visit she was down 6 pounds.  She was started on mirtazapine 7.5 mg at bedtime we discussed increasing her overall calories she was often skipping breakfast was also hungry.  She is now is eating three times a day with a smaller supper but also eating some healthy snacks during the day.  Her blood sugars have been stable even with her increasing her calories.  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  Still taking her Prozac Regards to cancer work-up was recommended that she schedule to get her mammogram done.  She needs to schedule with her GYN for Pap smear she does have history of abnormal Pap smear in the past.  Her last one was in 2019   Endocrinology for her diabetes mellitus hypokalemia she is still waiting to do her 24 hours urine done and metabolic panel - she is doing this with lab at work   Right ankle pain, she has some OA and gout, when she is up on her ankle it "flares up", it makes her limp.  She can tell when her gout is flaring.  She has not been taking allopurinol as it caused GI upset.  She is out of her colchicine.     Review Of Systems:  GEN- denies fatigue, fever, weight loss,weakness, recent illness HEENT- denies eye drainage, change in vision, nasal discharge, CVS- denies chest pain, palpitations RESP- denies SOB, cough, wheeze ABD- denies N/V,  change in stools, abd pain GU- denies dysuria, hematuria, dribbling, incontinence MSK- + joint pain, muscle aches, injury Neuro- denies headache, dizziness, syncope, seizure activity       Objective:    BP 138/78   Pulse 82   Temp 98.7 F (37.1 C) (Temporal)   Resp 14   Ht 5\' 3"  (1.6 m)   Wt 140 lb (63.5 kg)   SpO2 98%   BMI 24.80 kg/m  GEN- NAD, alert and oriented x3 HEENT- PERRL, EOMI, non injected sclera, pink conjunctiva  CVS- RRR, no murmur RESP-CTAB EXT- No edema Psych- normal affect and mood  Pulses- Radial, DP- 2+        Assessment & Plan:      Problem List Items Addressed This Visit      Unprioritized   Gout    She is going to try 50 mg of allopurinol once a day and then try to work her way up to 100 mg.  Have also refilled her colchicine.      Hypertension - Primary    Change in her medications secondary to her potassium.  Blood pressure looks okay today no change in medication.      Insomnia    Has been has increased her weight as well as help her sleep.  She has not required the Ambien  at this time.  She will also continue her Prozac. Discussed if her weight continues to go up we do not want her to become overweight or obese with the medication so we will have to monitor this.  Is going to schedule with her gynecologist as well as ophthalmologist       Other Visit Diagnoses    Unintentional weight loss       Weight gain achieved with remeron      Note: This dictation was prepared with Dragon dictation along with smaller phrase technology. Any transcriptional errors that result from this process are unintentional.

## 2019-02-28 NOTE — Assessment & Plan Note (Signed)
Has been has increased her weight as well as help her sleep.  She has not required the Ambien at this time.  She will also continue her Prozac. Discussed if her weight continues to go up we do not want her to become overweight or obese with the medication so we will have to monitor this.  Is going to schedule with her gynecologist as well as ophthalmologist

## 2019-02-28 NOTE — Assessment & Plan Note (Addendum)
She is going to try 50 mg of allopurinol once a day and then try to work her way up to 100 mg.  Have also refilled her colchicine.

## 2019-02-28 NOTE — Assessment & Plan Note (Signed)
Change in her medications secondary to her potassium.  Blood pressure looks okay today no change in medication.

## 2019-03-09 ENCOUNTER — Other Ambulatory Visit: Payer: Self-pay | Admitting: Family Medicine

## 2019-03-09 DIAGNOSIS — Z1231 Encounter for screening mammogram for malignant neoplasm of breast: Secondary | ICD-10-CM

## 2019-03-10 ENCOUNTER — Other Ambulatory Visit: Payer: Self-pay

## 2019-03-10 ENCOUNTER — Ambulatory Visit
Admission: RE | Admit: 2019-03-10 | Discharge: 2019-03-10 | Disposition: A | Discharge status: Home / Self Care | Payer: Commercial Managed Care - PPO | Source: Ambulatory Visit

## 2019-03-10 DIAGNOSIS — Z1231 Encounter for screening mammogram for malignant neoplasm of breast: Secondary | ICD-10-CM

## 2019-03-20 ENCOUNTER — Ambulatory Visit: Payer: Commercial Managed Care - PPO | Admitting: Cardiovascular Disease

## 2019-03-21 ENCOUNTER — Ambulatory Visit: Payer: Commercial Managed Care - PPO | Admitting: Endocrinology

## 2019-03-21 DIAGNOSIS — Z0289 Encounter for other administrative examinations: Secondary | ICD-10-CM

## 2019-03-27 ENCOUNTER — Encounter (HOSPITAL_COMMUNITY): Payer: Self-pay

## 2019-03-28 ENCOUNTER — Other Ambulatory Visit: Payer: Self-pay

## 2019-03-28 ENCOUNTER — Telehealth (HOSPITAL_COMMUNITY): Payer: Self-pay | Admitting: Emergency Medicine

## 2019-03-28 ENCOUNTER — Other Ambulatory Visit (HOSPITAL_COMMUNITY)
Admission: RE | Admit: 2019-03-28 | Discharge: 2019-03-28 | Disposition: A | Discharge status: Home / Self Care | Payer: Commercial Managed Care - PPO | Source: Ambulatory Visit | Attending: Cardiovascular Disease | Admitting: Cardiovascular Disease

## 2019-03-28 DIAGNOSIS — R072 Precordial pain: Secondary | ICD-10-CM | POA: Diagnosis not present

## 2019-03-28 LAB — BASIC METABOLIC PANEL
Anion gap: 9 (ref 5–15)
BUN: 20 mg/dL (ref 6–20)
CO2: 24 mmol/L (ref 22–32)
Calcium: 9.1 mg/dL (ref 8.9–10.3)
Chloride: 102 mmol/L (ref 98–111)
Creatinine, Ser: 0.69 mg/dL (ref 0.44–1.00)
GFR calc Af Amer: >60 mL/min (ref >60)
GFR calc non Af Amer: >60 mL/min (ref >60)
Glucose, Bld: 116 mg/dL — ABNORMAL HIGH (ref 70–99)
Potassium: 3.9 mmol/L (ref 3.5–5.1)
Sodium: 135 mmol/L (ref 135–145)

## 2019-03-28 NOTE — Telephone Encounter (Signed)
Spoke with patient about lab work and where to have done. [facility]ed [facility] so we would have access to results in time for test tomorrow. Where if she would have had them drawn at health dept we arent sure how soon we would have results. Pt verbalized understanding and appreciated the call  Marchia Bond RN Navigator Cardiac Imaging [facility] [phone] Office  [phone] Cell

## 2019-03-28 NOTE — Telephone Encounter (Signed)
Left message on VM for patient to bring copy of lab results with her to appt if she has any.  Marchia Bond RN Navigator Cardiac Imaging [facility] [phone] Office  [phone] Cell

## 2019-03-29 ENCOUNTER — Ambulatory Visit (HOSPITAL_COMMUNITY)
Admission: RE | Admit: 2019-03-29 | Discharge: 2019-03-29 | Disposition: A | Discharge status: Home / Self Care | Payer: Commercial Managed Care - PPO | Source: Ambulatory Visit | Attending: Cardiovascular Disease | Admitting: Cardiovascular Disease

## 2019-03-29 ENCOUNTER — Other Ambulatory Visit: Payer: Self-pay

## 2019-03-29 DIAGNOSIS — R072 Precordial pain: Secondary | ICD-10-CM | POA: Insufficient documentation

## 2019-03-29 DIAGNOSIS — Z006 Encounter for examination for normal comparison and control in clinical research program: Secondary | ICD-10-CM

## 2019-03-29 MED ORDER — NITROGLYCERIN 0.4 MG SL SUBL
0.8000 mg | SUBLINGUAL_TABLET | Freq: Once | SUBLINGUAL | Status: AC
  Administered 2019-03-29: 0.8 mg via SUBLINGUAL

## 2019-03-29 MED ORDER — NITROGLYCERIN 0.4 MG SL SUBL
SUBLINGUAL_TABLET | SUBLINGUAL | Status: AC
  Filled 2019-03-29: qty 2

## 2019-03-29 MED ORDER — IOHEXOL 350 MG/ML SOLN
100.0000 mL | Freq: Once | INTRAVENOUS | Status: AC | PRN
  Administered 2019-03-29: 100 mL via INTRAVENOUS

## 2019-03-29 NOTE — Research (Signed)
Cadfem Informed Consent    Patient Name: Stacy [name]   Subject met inclusion and exclusion criteria.  The informed consent form, study requirements and expectations were reviewed with the subject and questions and concerns were addressed prior to the signing of the consent form.  The subject verbalized understanding of the trail requirements.  The subject agreed to participate in the CADFEM trial and signed the informed consent.  The informed consent was obtained prior to performance of any protocol-specific procedures for the subject.  A copy of the signed informed consent was given to the subject and a copy was placed in the subject's medical record.   Neva Seat

## 2019-04-14 ENCOUNTER — Encounter: Payer: Self-pay | Admitting: Cardiovascular Disease

## 2019-04-14 ENCOUNTER — Ambulatory Visit (INDEPENDENT_AMBULATORY_CARE_PROVIDER_SITE_OTHER): Payer: Commercial Managed Care - PPO | Admitting: Cardiovascular Disease

## 2019-04-14 ENCOUNTER — Other Ambulatory Visit: Payer: Self-pay

## 2019-04-14 VITALS — BP 142/92 | HR 99 | Ht 63.0 in | Wt 148.0 lb

## 2019-04-14 DIAGNOSIS — Z716 Tobacco abuse counseling: Secondary | ICD-10-CM

## 2019-04-14 DIAGNOSIS — I2510 Atherosclerotic heart disease of native coronary artery without angina pectoris: Secondary | ICD-10-CM

## 2019-04-14 DIAGNOSIS — I10 Essential (primary) hypertension: Secondary | ICD-10-CM | POA: Diagnosis not present

## 2019-04-14 DIAGNOSIS — E7800 Pure hypercholesterolemia, unspecified: Secondary | ICD-10-CM

## 2019-04-14 DIAGNOSIS — Z5181 Encounter for therapeutic drug level monitoring: Secondary | ICD-10-CM | POA: Diagnosis not present

## 2019-04-14 DIAGNOSIS — Z720 Tobacco use: Secondary | ICD-10-CM

## 2019-04-14 MED ORDER — VALSARTAN-HYDROCHLOROTHIAZIDE 160-12.5 MG PO TABS: 1.0000 | ORAL_TABLET | Freq: Every day | ORAL | 3 refills | Status: DC

## 2019-04-14 MED ORDER — VARENICLINE TARTRATE 1 MG PO TABS: 1.0000 mg | ORAL_TABLET | Freq: Two times a day (BID) | ORAL | 3 refills | Status: DC

## 2019-04-14 MED ORDER — CHANTIX STARTING MONTH PAK 0.5 MG X 11 & 1 MG X 42 PO TABS: ORAL_TABLET | ORAL | 0 refills | Status: DC

## 2019-04-14 NOTE — Patient Instructions (Signed)
Medication Instructions:  STOP AMLODIPINE   START VALSARTAN HCT 160/12.5 MG DAILY   RX'S FOR CHANTIX HAVE BEEN GIVEN. IF YOU CHOOSE TO START YOU WILL TAKE THE STARTER PACK FIRST FOLLOWED BY THE OTHER RX   *If you need a refill on your cardiac medications before your next appointment, please call your pharmacy*  Lab Work: BMET IN 1 WEEK  FASTING LP/CMET IN 2-3 MONTHS   If you have labs (blood work) drawn today and your tests are completely normal, you will receive your results only by: Marland Kitchen MyChart Message (if you have MyChart) OR . A paper copy in the mail If you have any lab test that is abnormal or we need to change your treatment, we will call you to review the results.  Testing/Procedures: NONE  Follow-Up: At [facility], you and your health needs are our priority.  As part of our continuing mission to provide you with exceptional heart care, we have created designated Provider Care Teams.  These Care Teams include your primary Cardiologist (physician) and Advanced Practice Providers (APPs -  Physician Assistants and Nurse Practitioners) who all work together to provide you with the care you need, when you need it.  We recommend signing up for the patient portal called "MyChart".  Sign up information is provided on this After Visit Summary.  MyChart is used to connect with patients for Virtual Visits (Telemedicine).  Patients are able to view lab/test results, encounter notes, upcoming appointments, etc.  Non-urgent messages can be sent to your provider as well.   To learn more about what you can do with MyChart, go to [email or web address].    Your next appointment:   1 month(s)  The format for your next appointment:   Virtual Visit   Provider:   DR [place], PA/NP, OR PHARM D FOR BLOOD PRESSURE   Other Instructions  1-800-QUIT-NOW TO HELP WITH YOUR SMOKING

## 2019-04-14 NOTE — Progress Notes (Signed)
Cardiology Office Note   Date:  04/14/2019   ID:  Stacy [name], DOB 1965-08-17, MRN [id number]  PCP:  Alycia Rossetti, MD  Cardiologist:   Skeet Latch, MD   No chief complaint on file.    History of Present Illness: Stacy [name] is a 54 y.o. female with mild CAD, hypertension, diabetes, fatty liver disease, elevated LFTs, and tobacco abuse who presents for follow up.  Stacy [name] saw Dr. Coralyn Mark and was referred for a CT of the chest, abdomen, and pelvis for unintentional weight loss and tobacco abuse.  On that CT she was noted to have mild to moderate calcified plaque in the thoracic aorta and left anterior descending coronary artery calcification.  Therefore she was referred to cardiology for further evaluation.  Stacy [name] was feeling well but not physically active.  She had no exertional symptoms so prevention was stressed.  Her husband died unexpectedly of a heart attack a few years ago.  He was the main cook.  She reported exertional dyspnea and was referred for coronary CT-A 03/29/2019 that revealed mild plaque in the LAD and minimal plaque in the left circumflex.  Atorvastatin was increased due to these findings.  At her last appointment Stacy [name] blood pressure was elevated.  Her PCP had recommended switching from HCTZ to amlodipine due to hypokalemia.  She did make this change and has found that her blood pressure continues to be elevated.  She also notes some mild swelling and puffiness in her feet and ankles.  She denies any chest pain and her breathing is stable.  She has no orthopnea or PND.  She is ready to quit smoking but knows that she cannot do it by herself.  When her husband used Chantix he had bad dreams.  Her mother had a seizure on Wellbutrin.  She does not know what to try.     Past Medical History:  Diagnosis Date  . Adrenal adenoma    as per MRI 03/09/14  . Allergy   . Anxiety   . Coronary artery calcification seen on CAT scan 04/12/2018  .  Depression   . Diabetes mellitus   . Diverticulosis   . Elevated LFTs    elevated since 09/2013  . Fatty liver    moderate- as per 03/06/17 MRI liver  . GERD (gastroesophageal reflux disease)   . Gout   . H/O hiatal hernia   . Headache(784.0)   . History of depression   . Hyperlipemia   . Hyperplastic colon polyp   . Hypertension   . Positive hepatitis C antibody test    RNA NEGATIVE  . Thyroid nodule  managed Dr. Posey Pronto 02/27/2014  . Tobacco abuse   . Tubular adenoma of colon     Past Surgical History:  Procedure Laterality Date  . ANAL FISTULECTOMY  01/19/1985  . BREAST EXCISIONAL BIOPSY Left   . BREAST LUMPECTOMY Left 1988  . PAROTIDECTOMY  03/30/2011   Procedure: PAROTIDECTOMY;  Surgeon: Izora Gala, MD;  Location: [place];  Service: ENT;  Laterality: Right;  . WRIST GANGLION EXCISION Left 1988     Current Outpatient Medications  Medication Sig Dispense Refill  . albuterol (PROVENTIL HFA;VENTOLIN HFA) 108 (90 Base) MCG/ACT inhaler Inhale 2 puffs into the lungs every 4 (four) hours as needed for wheezing or shortness of breath. 1 Inhaler 0  . allopurinol (ZYLOPRIM) 100 MG tablet TAKE ONE TABLET BY MOUTH DAILY. START ON 04/28/2017. 30 tablet 6  .  amLODipine (NORVASC) 5 MG tablet Take 1 tablet (5 mg total) by mouth daily. 30 tablet 3  . atorvastatin (LIPITOR) 20 MG tablet TAKE 1 TABLET BY MOUTH EVERY DAY 90 tablet 3  . colchicine 0.6 MG tablet Take 1 tablet (0.6 mg total) by mouth daily. 30 tablet 6  . EPINEPHrine 0.3 mg/0.3 mL IJ SOAJ injection Insert into muscle once with any facial or neck swelling  Instruct in use 1 Device 1  . FLUoxetine (PROZAC) 40 MG capsule TAKE 1 CAPSULE BY MOUTH DAILY. 90 capsule 2  . glimepiride (AMARYL) 1 MG tablet TAKE 1 TABLET BY MOUTH DAILY BEFORE SUPPER. 90 tablet 0  . glucose blood test strip Use as instructed to monitor FSBS 3x daily. Dx: E11.65 200 each 12  . Lancets MISC Use as instructed to monitor FSBS 3x daily. Dx: E11.65  100 each 11  . metFORMIN (GLUCOPHAGE) 850 MG tablet Take 1 tablet (850 mg total) by mouth 2 (two) times daily with a meal. 180 tablet 3  . mirtazapine (REMERON) 15 MG tablet TAKE 1/2 TABLET BY MOUTH AT BEDTIME 45 tablet 2  . Multiple Vitamins-Minerals (ONE-A-DAY WOMENS VITACRAVES) CHEW Chew 1 each by mouth daily.    . pantoprazole (PROTONIX) 40 MG tablet TAKE 1 TABLET BY MOUTH EVERY DAY 90 tablet 1  . pioglitazone (ACTOS) 15 MG tablet Take 1 tablet (15 mg total) by mouth daily. 30 tablet 2  . spironolactone (ALDACTONE) 50 MG tablet TAKE 1 TABLET BY MOUTH EVERY DAY 90 tablet 0  . SUMAtriptan (IMITREX) 100 MG tablet Take 1 tablet (100 mg total) by mouth every 2 (two) hours as needed for migraine. May repeat in 2 hours if headache persists or recurs. 9 tablet 0  . zolpidem (AMBIEN) 10 MG tablet Take 1 tablet (10 mg total) by mouth at bedtime as needed for sleep. 30 tablet 2   No current facility-administered medications for this visit.    Allergies:   Almond meal, Other, and Aspirin    Social History:  The patient  reports that she has been smoking cigarettes. She has a 28.00 pack-year smoking history. She has never used smokeless tobacco. She reports current alcohol use of about 14.0 standard drinks of alcohol per week. She reports that she does not use drugs.   Family History:  The patient's family history includes Aneurysm in her sister; Blindness in her paternal grandmother; Cancer (age of onset: 45) in her sister; Coronary artery disease (age of onset: 66) in her father; Diabetes in her sister; Heart attack in her maternal grandmother; Hypertension in her mother; Stroke in her mother.    ROS:  Please see the history of present illness.   Otherwise, review of systems are positive for none.   All other systems are reviewed and negative.    PHYSICAL EXAM: VS:  BP (!) 142/92   Pulse 99   Ht 5\' 3"  (1.6 m)   Wt 148 lb (67.1 kg)   SpO2 93%   BMI 26.22 kg/m  , BMI Body mass index is 26.22  kg/m. GENERAL:  Well appearing HEENT: Pupils equal round and reactive, fundi not visualized, oral mucosa unremarkable NECK:  No jugular venous distention, waveform within normal limits, carotid upstroke brisk and symmetric, no bruits LUNGS:  Clear to auscultation bilaterally HEART:  RRR.  PMI not displaced or sustained,S1 and S2 within normal limits, no S3, no S4, no clicks, no rubs, no murmurs ABD:  Flat, positive bowel sounds normal in frequency in pitch, no bruits,  no rebound, no guarding, no midline pulsatile mass, no hepatomegaly, no splenomegaly EXT:  2 plus pulses throughout, no edema, no cyanosis no clubbing SKIN:  No rashes no nodules NEURO:  Cranial nerves II through XII grossly intact, motor grossly intact throughout PSYCH:  Cognitively intact, oriented to person place and time    EKG:  EKG is not ordered today. The ekg ordered today demonstrates sinus tachycardia.  Rate 110 bpm.  Biatrial enlargement.  Coronary CT-A 03/29/19: IMPRESSION: 1. Coronary calcium score of 50.4. This was 48 percentile for age and sex matched control.  2. Normal coronary origin with right dominance.  3. Mild Coronary artery disease. CADRADS 2. Recommend medical therapy.  4. Mild calcification of the aortic root.    Recent Labs: 10/03/2018: Hemoglobin 12.8; Platelets 429 01/10/2019: ALT 28 03/28/2019: BUN 20; Creatinine, Ser 0.69; Potassium 3.9; Sodium 135    Lipid Panel    Component Value Date/Time   CHOL 176 01/10/2019 0918   TRIG 144.0 01/10/2019 0918   HDL 50.50 01/10/2019 0918   CHOLHDL 3 01/10/2019 0918   VLDL 28.8 01/10/2019 0918   LDLCALC 97 01/10/2019 0918      Wt Readings from Last 3 Encounters:  04/14/19 148 lb (67.1 kg)  02/28/19 140 lb (63.5 kg)  02/20/19 138 lb (62.6 kg)      ASSESSMENT AND PLAN:  # Coronary and thoracic aorta calcifcation: # Pure hypercholesterolemia: # Shortness of breath: Coronary CT-A 03/2019 revealed nonobstructive disease.  However  she is in the 98 percentile for calcium score.  We will treat her aggressively for primary prevention.  Atorvastatin was increased and she will have lipids and a CMP rechecked in the end of May early June.  # Hypertension:  BP above goal.    Switch to HCTZ/losartan 12.5/160 mg.  Check BMP in 1 week.  Continue spironolactone.    # Tobacco abuse: Stacy [name] is ready to quit.  Recommended calling [phone] quit now.  We will give a prescription for Chantix and she will decide if she wants to fill it.  She is currently smoking 1 pack a day.  Smoking cessation was discussed for 5 minutes.    Current medicines are reviewed at length with the patient today.  The patient does not have concerns regarding medicines.  The following changes have been made:  no change  Labs/ tests ordered today include:  No orders of the defined types were placed in this encounter.    Disposition:   FU with Koraima Albertsen C. Oval Linsey, MD, [facility] in 1 month.      Signed, Kaena Santori C. Oval Linsey, MD, [facility]  04/14/2019 9:14 AM    [place]

## 2019-04-15 ENCOUNTER — Telehealth: Payer: Self-pay | Admitting: Endocrinology

## 2019-04-17 NOTE — Telephone Encounter (Signed)
Need to find out if she is still taking this, was given this on her last visit.  May give 30-day prescription only Also needs follow-up with labs as she is overdue

## 2019-04-17 NOTE — Telephone Encounter (Signed)
Medication is not currently on MAR. Would you like to refill?

## 2019-04-18 NOTE — Telephone Encounter (Signed)
Please check on her amlodipine prescription as well as need a follow-up appointment

## 2019-04-18 NOTE — Telephone Encounter (Signed)
LVM requesting returned call. Please call pt to f/u re: scheduling appt.  Outpatient Medication Detail   Disp Refills Start End   amLODipine (NORVASC) 5 MG tablet 30 tablet 0 04/17/2019    Sig - Route: Take 1 tablet (5 mg total) by mouth daily. Needs to make f/u appt for additional refills. - Oral   Sent to pharmacy as: amLODipine (NORVASC) 5 MG tablet   E-Prescribing Status: Receipt confirmed by pharmacy (04/17/2019  2:24 PM EDT)

## 2019-04-19 NOTE — Telephone Encounter (Signed)
Pt is set for labs and a visit in April

## 2019-05-01 ENCOUNTER — Other Ambulatory Visit: Payer: Commercial Managed Care - PPO

## 2019-05-03 ENCOUNTER — Ambulatory Visit: Payer: Commercial Managed Care - PPO | Admitting: Endocrinology

## 2019-05-06 ENCOUNTER — Other Ambulatory Visit: Payer: Self-pay | Admitting: Endocrinology

## 2019-05-15 ENCOUNTER — Other Ambulatory Visit: Payer: Commercial Managed Care - PPO

## 2019-05-15 IMAGING — DX DG CHEST 2V
2 series · 2 of 2 positions shown · non-contrast
Comparison: Radiographs November 19, 2015.

CLINICAL DATA: Cough.

EXAM:
CHEST  2 VIEW

[chest pa]
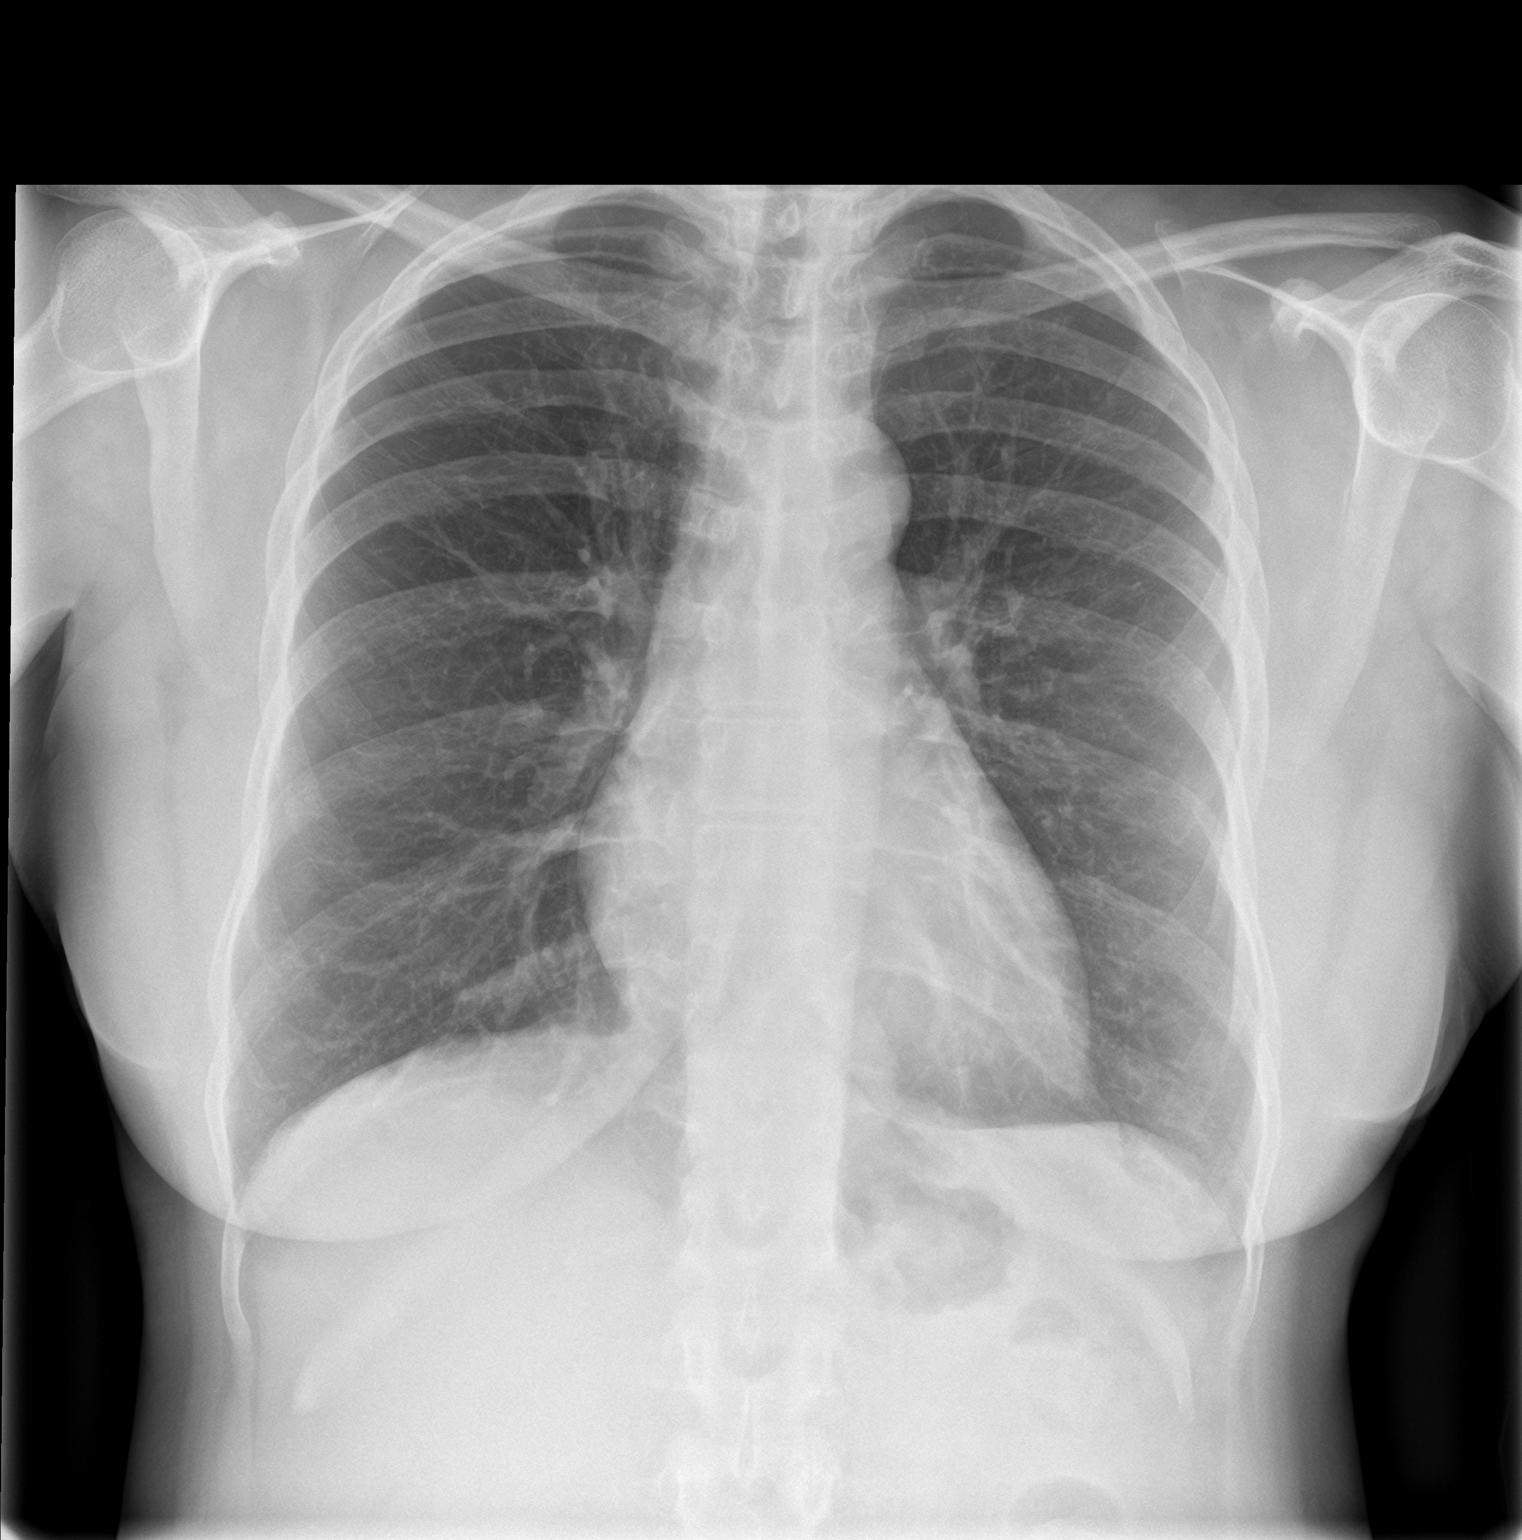

[chest lat]
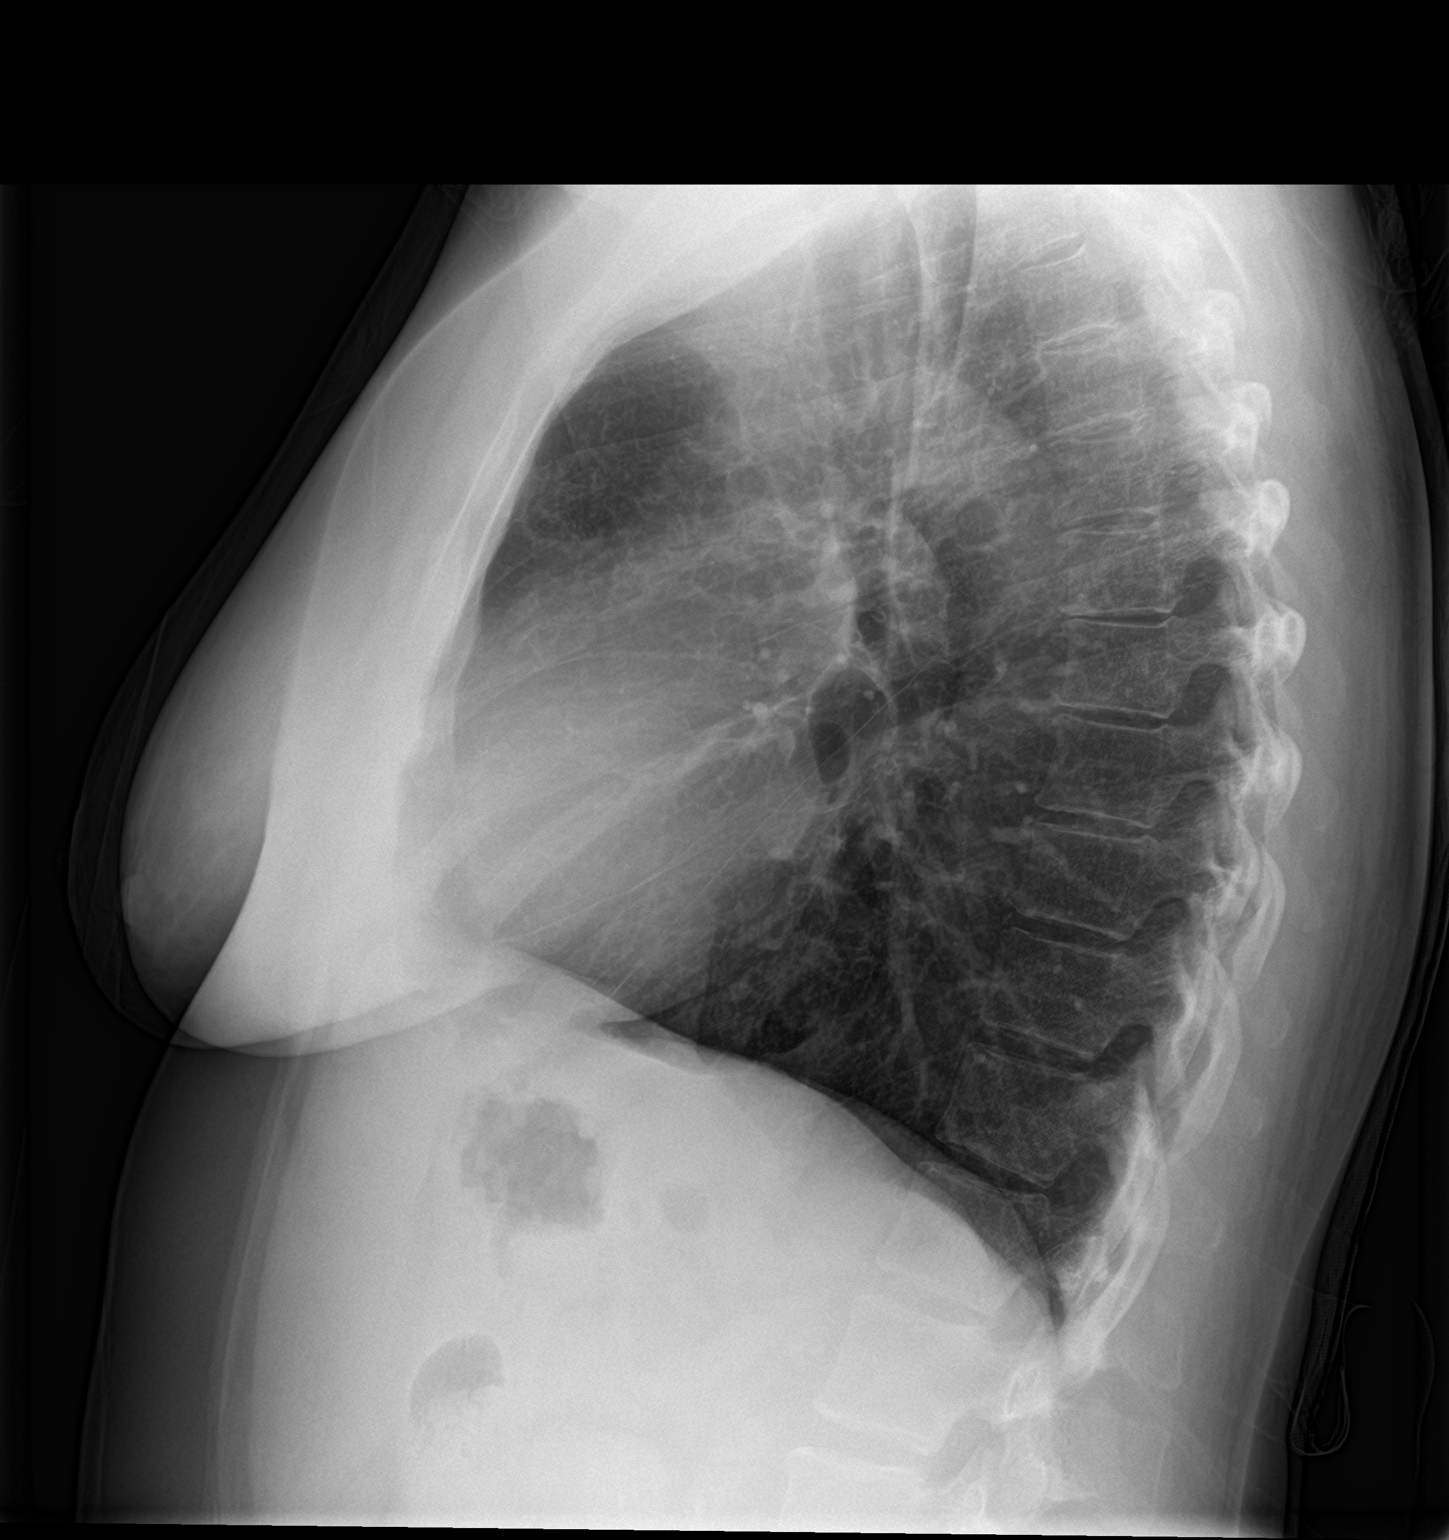

[2 of 2 positions shown; findings below may reference images not displayed]

FINDINGS: The heart size and mediastinal contours are within normal limits.
Both lungs are clear. No pneumothorax or pleural effusion is noted.
The visualized skeletal structures are unremarkable.
IMPRESSION: No active cardiopulmonary disease.

## 2019-05-17 ENCOUNTER — Ambulatory Visit: Payer: Commercial Managed Care - PPO | Admitting: Endocrinology

## 2019-05-18 ENCOUNTER — Telehealth: Payer: Self-pay | Admitting: Cardiovascular Disease

## 2019-05-18 ENCOUNTER — Telehealth: Payer: Self-pay | Admitting: *Deleted

## 2019-05-18 ENCOUNTER — Telehealth (INDEPENDENT_AMBULATORY_CARE_PROVIDER_SITE_OTHER): Payer: Commercial Managed Care - PPO | Admitting: Cardiovascular Disease

## 2019-05-18 ENCOUNTER — Telehealth: Payer: Self-pay

## 2019-05-18 ENCOUNTER — Encounter: Payer: Self-pay | Admitting: Cardiovascular Disease

## 2019-05-18 VITALS — Ht 63.0 in

## 2019-05-18 DIAGNOSIS — E7800 Pure hypercholesterolemia, unspecified: Secondary | ICD-10-CM | POA: Diagnosis not present

## 2019-05-18 DIAGNOSIS — I10 Essential (primary) hypertension: Secondary | ICD-10-CM

## 2019-05-18 DIAGNOSIS — Z720 Tobacco use: Secondary | ICD-10-CM | POA: Diagnosis not present

## 2019-05-18 DIAGNOSIS — I2510 Atherosclerotic heart disease of native coronary artery without angina pectoris: Secondary | ICD-10-CM

## 2019-05-18 NOTE — Patient Instructions (Addendum)
Medication Instructions:  Your physician recommends that you continue on your current medications as directed. Please refer to the Current Medication list given to you today.  *If you need a refill on your cardiac medications before your next appointment, please call your pharmacy*  Lab Work: FASTING LP/CMET/RENIN/ALDOSTERONE SOON   For 2 days prior to the labs do not take spironolactone or Diovan (Valsartan). During that time take 10mg  of amlodipine. After you get labs drawn take 2 tabs of diovan and resume sprionolactone at 50mg , amlodipine at 5mg   If you have labs (blood work) drawn today and your tests are completely normal, you will receive your results only by: Marland Kitchen MyChart Message (if you have MyChart) OR . A paper copy in the mail If you have any lab test that is abnormal or we need to change your treatment, we will call you to review the results.  Testing/Procedures: Your physician has requested that you have a renal artery duplex. During this test, an ultrasound is used to evaluate blood flow to the kidneys. Allow one hour for this exam. Do not eat after midnight the day before and avoid carbonated beverages. Take your medications as you usually do.  Follow-Up: At [facility], you and your health needs are our priority.  As part of our continuing mission to provide you with exceptional heart care, we have created designated Provider Care Teams.  These Care Teams include your primary Cardiologist (physician) and Advanced Practice Providers (APPs -  Physician Assistants and Nurse Practitioners) who all work together to provide you with the care you need, when you need it.  We recommend signing up for the patient portal called "MyChart".  Sign up information is provided on this After Visit Summary.  MyChart is used to connect with patients for Virtual Visits (Telemedicine).  Patients are able to view lab/test results, encounter notes, upcoming appointments, etc.  Non-urgent messages can  be sent to your provider as well.   To learn more about what you can do with MyChart, go to [email or web address].    Your next appointment:   2 month(s)  The format for your next appointment:   In Person  Provider:   You may see Skeet Latch, MD or one of the following Advanced Practice Providers on your designated Care Team:    Kerin Ransom, PA-C  [place], [place]  Coletta Memos, [place]

## 2019-05-18 NOTE — Telephone Encounter (Signed)
Stacy [name], Care Guide/Health Coach, called patient regarding discussing with Dr. Oval Linsey on smoking to set up initial appointment. Left patient a message to return call at [phone] or [phone].

## 2019-05-18 NOTE — Telephone Encounter (Signed)
Left message to call back to go over AVS Need fax number to fax lab orders

## 2019-05-18 NOTE — Telephone Encounter (Signed)
LMOM for patient to call and schedule 2 mos f/u with Dr. Oval Linsey and renal duplex... AF

## 2019-05-18 NOTE — Progress Notes (Signed)
Tele-Health Visit- Phone     Evaluation Performed:  Follow-up visit  This visit type was conducted due to national recommendations for restrictions regarding the COVID-19 Pandemic (e.g. social distancing).  This format is felt to be most appropriate for this patient at this time.  All issues noted in this document were discussed and addressed.  No physical exam was performed (except for noted visual exam findings with Telehealth visits).  See MyChart message from today for the patient's consent to telehealth for [facility].  Date:  05/18/2019   ID:  Stacy [name], DOB 1965-10-26, MRN [id number]  Patient Location:  [place] [address]   Provider location:   [facility]  PCP:  Alycia Rossetti, MD  Cardiologist:  Skeet Latch, MD    Chief Complaint:  Atherosclerosis on CT  History of Present Illness:    Stacy [name] is a 54 y.o. female with mild CAD, hypertension, diabetes,fatty liver disease, elevated LFTs,and tobacco abusewho presents for follow up.  Stacy [name] saw Dr. Ihor Gully was referred for a CT of the chest, abdomen, and pelvis for unintentional weight lossand tobacco abuse. On that CT she was noted to have mild to moderate calcified plaque in the thoracic aorta and left anterior descending coronary artery calcification. Therefore she was referred to cardiology for further evaluation. Stacy [name] well but not physically active. She had no exertional symptoms so prevention was stressed.  Her husband died unexpectedly of a heart attack a few years ago. He was the main cook.  She reported exertional dyspnea and was referred for coronary CT-A 03/29/2019 that revealed mild plaque in the LAD and minimal plaque in the left circumflex.  Atorvastatin was increased due to these findings.  At her last appointment Stacy [name] blood pressure was elevated.  Her PCP had recommended switching from HCTZ to amlodipine due to  hypokalemia.  She did make this change and has found that her blood pressure continued to be elevated.  She also had swelling in her ankles.  At her last appointment she was switched to valsartan/HCTZ and amlodipine was reduced to 5 mg.  This has helped with the swelling.  Her blood pressure has been running around 140/87. She has been exercising by walking 3 times per day.  Her breathing has improved.  When she first started she got more short of breath.  She has no chest pain or pressure.  She has no more lower extremity edema.  She complains of [name] bloated after she eats.  This started after she had her colonoscopy in December.  She continues to smoke about 1 pack of cigarettes daily.  She wants to quit.  The patient does not symptoms concerning for COVID-19 infection (fever, chills, cough, or new SHORTNESS OF BREATH).    Prior CV studies:   The following studies were reviewed today: N/a  Past Medical History:  Diagnosis Date  . Adrenal adenoma    as per MRI 03/09/14  . Allergy   . Anxiety   . Coronary artery calcification seen on CAT scan 04/12/2018  . Depression   . Diabetes mellitus   . Diverticulosis   . Elevated LFTs    elevated since 09/2013  . Fatty liver    moderate- as per 03/06/17 MRI liver  . GERD (gastroesophageal reflux disease)   . Gout   . H/O hiatal hernia   . Headache(784.0)   . History of depression   . Hyperlipemia   . Hyperplastic colon  polyp   . Hypertension   . Positive hepatitis C antibody test    RNA NEGATIVE  . Thyroid nodule  managed Dr. Posey Pronto 02/27/2014  . Tobacco abuse   . Tubular adenoma of colon    Past Surgical History:  Procedure Laterality Date  . ANAL FISTULECTOMY  01/19/1985  . BREAST EXCISIONAL BIOPSY Left   . BREAST LUMPECTOMY Left 1988  . PAROTIDECTOMY  03/30/2011   Procedure: PAROTIDECTOMY;  Surgeon: Izora Gala, MD;  Location: [place];  Service: ENT;  Laterality: Right;  . WRIST GANGLION EXCISION Left 1988      Current Meds  Medication Sig  . albuterol (PROVENTIL HFA;VENTOLIN HFA) 108 (90 Base) MCG/ACT inhaler Inhale 2 puffs into the lungs every 4 (four) hours as needed for wheezing or shortness of breath.  . allopurinol (ZYLOPRIM) 100 MG tablet TAKE ONE TABLET BY MOUTH DAILY. START ON 04/28/2017.  Marland Kitchen amLODipine (NORVASC) 5 MG tablet TAKE 1 TABLET BY MOUTH DAILY. NEEDS TO MAKE F/U APPT FOR ADDITIONAL REFILLS.  Marland Kitchen atorvastatin (LIPITOR) 20 MG tablet TAKE 1 TABLET BY MOUTH EVERY DAY  . EPINEPHrine 0.3 mg/0.3 mL IJ SOAJ injection Insert into muscle once with any facial or neck swelling  Instruct in use  . FLUoxetine (PROZAC) 40 MG capsule TAKE 1 CAPSULE BY MOUTH DAILY.  Marland Kitchen glimepiride (AMARYL) 1 MG tablet TAKE 1 TABLET BY MOUTH DAILY BEFORE SUPPER.  Marland Kitchen glucose blood test strip Use as instructed to monitor FSBS 3x daily. Dx: E11.65  . metFORMIN (GLUCOPHAGE) 850 MG tablet Take 1 tablet (850 mg total) by mouth 2 (two) times daily with a meal.  . mirtazapine (REMERON) 15 MG tablet TAKE 1/2 TABLET BY MOUTH AT BEDTIME  . Multiple Vitamins-Minerals (ONE-A-DAY WOMENS VITACRAVES) CHEW Chew 1 each by mouth daily.  . pantoprazole (PROTONIX) 40 MG tablet TAKE 1 TABLET BY MOUTH EVERY DAY  . pioglitazone (ACTOS) 15 MG tablet Take 1 tablet (15 mg total) by mouth daily.  Marland Kitchen spironolactone (ALDACTONE) 50 MG tablet TAKE 1 TABLET BY MOUTH EVERY DAY  . SUMAtriptan (IMITREX) 100 MG tablet Take 1 tablet (100 mg total) by mouth every 2 (two) hours as needed for migraine. May repeat in 2 hours if headache persists or recurs.  . valsartan-hydrochlorothiazide (DIOVAN HCT) 160-12.5 MG tablet Take 1 tablet by mouth daily.  Marland Kitchen zolpidem (AMBIEN) 10 MG tablet Take 1 tablet (10 mg total) by mouth at bedtime as needed for sleep.     Allergies:   Almond meal, Other, and Aspirin   Social History   Tobacco Use  . Smoking status: Current Every Day Smoker    Packs/day: 1.00    Years: 28.00    Pack years: 28.00    Types: Cigarettes  .  Smokeless tobacco: Never Used  Substance Use Topics  . Alcohol use: Yes    Alcohol/week: 14.0 standard drinks    Types: 14 Standard drinks or equivalent per week  . Drug use: No     Family Hx: The patient's family history includes Aneurysm in her sister; Blindness in her paternal grandmother; Cancer (age of onset: 70) in her sister; Coronary artery disease (age of onset: 18) in her father; Diabetes in her sister; Heart attack in her maternal grandmother; Hypertension in her mother; Stroke in her mother. There is no history of Breast cancer, Colon cancer, Esophageal cancer, Rectal cancer, or Stomach cancer.  ROS:   Please see the history of present illness.     All other systems reviewed and are  negative.   Labs/Other Tests and Data Reviewed:    Recent Labs: 10/03/2018: Hemoglobin 12.8; Platelets 429 01/10/2019: ALT 28 03/28/2019: BUN 20; Creatinine, Ser 0.69; Potassium 3.9; Sodium 135   Recent Lipid Panel Lab Results  Component Value Date/Time   CHOL 176 01/10/2019 09:18 AM   TRIG 144.0 01/10/2019 09:18 AM   HDL 50.50 01/10/2019 09:18 AM   CHOLHDL 3 01/10/2019 09:18 AM   LDLCALC 97 01/10/2019 09:18 AM    Wt Readings from Last 3 Encounters:  04/14/19 148 lb (67.1 kg)  02/28/19 140 lb (63.5 kg)  02/20/19 138 lb (62.6 kg)     Exam:    Ht 5\' 3"  (1.6 m)   BMI 26.22 kg/m  GENERAL: Sounds well.  No acute distress. RESP: Respirations unlabored. NEURO:  Speech fluent.   PSYCH:  Cognitively intact, oriented to person place and time   ASSESSMENT & PLAN:    # Coronary and thoracic aorta calcifcation: # Pure hypercholesterolemia: # Shortness of breath: Coronary CT-A 03/2019 revealed nonobstructive disease.  However she is in the 98 percentile for calcium score.    We are treating her aggressively for primary prevention.  Atorvastatin was increased and she will have lipids and a CMP checked at work.  # Hypertension:  BP remains above goal. Continue spironolactone and  amlodipine.  Increase Diovan to 320/25 mg.  We will look for secondary causes of hypertension.  Check renal artery Dopplers.  She will also have plasma renin/Aldo checked.  She will need to hold spironolactone and Diovan for 2 days prior to having this checked.  During that time she will take 10 mg of amlodipine.  # Tobacco abuse: Ms. Tuchman is ready to quit.   She has a prescription for Chantix that she is considering using.  We will also refer her to our Care Guide to help with smoking cessation.  COVID-19 Education: The signs and symptoms of COVID-19 were discussed with the patient and how to seek care for testing (follow up with PCP or arrange E-visit).  The importance of social distancing was discussed today.  Patient Risk:   After full review of this patients clinical status, I feel that they are at least moderate risk at this time.  Time:   Today, I have spent 26 minutes with the patient with telehealth technology discussing abnormal CT findings, hypertension.     Medication Adjustments/Labs and Tests Ordered: Current medicines are reviewed at length with the patient today.  Concerns regarding medicines are outlined above.    Tests Ordered: No orders of the defined types were placed in this encounter.  Medication Changes: No orders of the defined types were placed in this encounter.   Disposition:  in 2 month(s)  Signed, Skeet Latch, MD  05/18/2019 9:13 AM    [facility] [place], [place], [place]  [address] Phone: [phone]; Fax: [phone]

## 2019-05-24 ENCOUNTER — Encounter (HOSPITAL_COMMUNITY): Payer: Commercial Managed Care - PPO

## 2019-05-30 ENCOUNTER — Ambulatory Visit (HOSPITAL_COMMUNITY)
Admission: RE | Admit: 2019-05-30 | Payer: Commercial Managed Care - PPO | Source: Ambulatory Visit | Attending: Cardiovascular Disease | Admitting: Cardiovascular Disease

## 2019-06-06 ENCOUNTER — Other Ambulatory Visit (INDEPENDENT_AMBULATORY_CARE_PROVIDER_SITE_OTHER): Payer: Commercial Managed Care - PPO

## 2019-06-06 ENCOUNTER — Other Ambulatory Visit: Payer: Self-pay

## 2019-06-06 DIAGNOSIS — E876 Hypokalemia: Secondary | ICD-10-CM

## 2019-06-06 DIAGNOSIS — E049 Nontoxic goiter, unspecified: Secondary | ICD-10-CM | POA: Diagnosis not present

## 2019-06-06 LAB — BASIC METABOLIC PANEL
BUN: 17 mg/dL (ref 6–23)
CO2: 29 mEq/L (ref 19–32)
Calcium: 9 mg/dL (ref 8.4–10.5)
Chloride: 99 mEq/L (ref 96–112)
Creatinine, Ser: 0.76 mg/dL (ref 0.40–1.20)
GFR: 95.71 mL/min (ref >60.00)
Glucose, Bld: 218 mg/dL — ABNORMAL HIGH (ref 70–99)
Potassium: 3.4 mEq/L — ABNORMAL LOW (ref 3.5–5.1)
Sodium: 138 mEq/L (ref 135–145)

## 2019-06-06 LAB — T4, FREE: Free T4: 1.06 ng/dL (ref 0.60–1.60)

## 2019-06-06 LAB — TSH: TSH: 0.61 u[IU]/mL (ref 0.35–4.50)

## 2019-06-08 ENCOUNTER — Encounter: Payer: Self-pay | Admitting: *Deleted

## 2019-06-08 ENCOUNTER — Ambulatory Visit: Payer: Commercial Managed Care - PPO | Admitting: Endocrinology

## 2019-06-08 ENCOUNTER — Other Ambulatory Visit: Payer: Self-pay | Admitting: Family Medicine

## 2019-06-08 NOTE — Telephone Encounter (Signed)
Sent mychart message requesting fax number

## 2019-06-09 ENCOUNTER — Encounter (HOSPITAL_COMMUNITY): Payer: Self-pay

## 2019-06-13 ENCOUNTER — Other Ambulatory Visit: Payer: Self-pay

## 2019-06-13 ENCOUNTER — Encounter: Payer: Self-pay | Admitting: Endocrinology

## 2019-06-13 ENCOUNTER — Encounter: Payer: Commercial Managed Care - PPO | Admitting: Endocrinology

## 2019-06-13 MED ORDER — SPIRONOLACTONE 50 MG PO TABS: 50.0000 mg | ORAL_TABLET | Freq: Every day | ORAL | 0 refills | Status: DC

## 2019-06-13 MED ORDER — GLIMEPIRIDE 1 MG PO TABS: ORAL_TABLET | ORAL | 0 refills | Status: DC

## 2019-06-13 NOTE — Progress Notes (Signed)
   Review of Systems   This encounter was created in error - please disregard.

## 2019-06-21 ENCOUNTER — Encounter: Payer: Self-pay | Admitting: Family Medicine

## 2019-06-22 ENCOUNTER — Other Ambulatory Visit: Payer: Self-pay

## 2019-06-23 ENCOUNTER — Ambulatory Visit (INDEPENDENT_AMBULATORY_CARE_PROVIDER_SITE_OTHER): Payer: Commercial Managed Care - PPO | Admitting: Endocrinology

## 2019-06-23 ENCOUNTER — Other Ambulatory Visit: Payer: Self-pay | Admitting: Endocrinology

## 2019-06-23 ENCOUNTER — Encounter: Payer: Self-pay | Admitting: Endocrinology

## 2019-06-23 VITALS — BP 134/90 | HR 105 | Ht 63.0 in | Wt 150.8 lb

## 2019-06-23 DIAGNOSIS — I159 Secondary hypertension, unspecified: Secondary | ICD-10-CM

## 2019-06-23 DIAGNOSIS — E876 Hypokalemia: Secondary | ICD-10-CM | POA: Diagnosis not present

## 2019-06-23 DIAGNOSIS — E1165 Type 2 diabetes mellitus with hyperglycemia: Secondary | ICD-10-CM | POA: Diagnosis not present

## 2019-06-23 LAB — POCT GLYCOSYLATED HEMOGLOBIN (HGB A1C): Hemoglobin A1C: 8.3 % — AB (ref 4.0–5.6)

## 2019-06-23 MED ORDER — SYNJARDY XR 5-1000 MG PO TB24: 2.0000 | ORAL_TABLET | Freq: Every day | ORAL | 1 refills | Status: DC

## 2019-06-23 MED ORDER — SPIRONOLACTONE 50 MG PO TABS: 50.0000 mg | ORAL_TABLET | Freq: Every day | ORAL | 1 refills | Status: DC

## 2019-06-23 MED ORDER — AMLODIPINE BESYLATE 5 MG PO TABS: ORAL_TABLET | ORAL | 1 refills | Status: DC

## 2019-06-23 MED ORDER — PIOGLITAZONE HCL 15 MG PO TABS: 15.0000 mg | ORAL_TABLET | Freq: Every day | ORAL | 1 refills | Status: DC

## 2019-06-23 NOTE — Progress Notes (Signed)
Patient ID: Stacy [name], female   DOB: [birth year], 54 y.o.   MRN: [id number]             Reason for Appointment: Endocrinology follow-up    History of Present Illness:          Date of diagnosis of type 2 diabetes mellitus: 2014        Background history:    She apparently was asymptomatic at the time of diagnosis She was started on metformin but no details of initial management or A1c are available She thinks about 3 years ago she was started on Farxiga also with improved control and no side effects with this She has mostly been followed by a PCP but may have seen an endocrinologist also About 1-1/2-2 years ago she was also given basal insulin with Tyler Aas presumably for higher sugars but not clear what her A1c was at that time  Recent history:    Her A1c was last  6.8 and now 8.3   Non-insulin hypoglycemic drugs the patient is taking are: Jardiance 25 mg, currently not taking, metformin 850 mg twice daily, Actos 15 mg, currently not taking, Amaryl 2mg  at dinner  Current management, blood sugar patterns and problems identified:  She has been off ACTOS for couple of months since she did not have a prescription  Did not come back for follow-up in February as instructed after her last visit in December  Also she left off her Vania Rea a couple of months ago since she did not have a prescription and did not ask for refill  She thinks that she was having recurrent yeast infections also with the 25 mg but did not let us know  She is very inconsistent with checking her sugars and her meter has only 4 readings in the last few days and none previously, she do not know her blood sugars are starting to go up   Lab fasting glucose 218  She has not done any exercise  Weight has gone up significantly but she was given Megace by her PCP previously        Side effects from medications have been: Diarrhea from metformin  Compliance with the medical regimen:  Fair Hypoglycemia:  none   Glucose monitoring:  done  <1 times a day         Glucometer:  Accu-Chek      Blood Glucose readings by download of meter show the following recent 4 fasting readings:  168-183  Self-care: The diet that the patient has been following is: tries to limit fried food.            Dietician visit, most recent: None  Weight history:  Wt Readings from Last 3 Encounters:  06/23/19 150 lb 12.8 oz (68.4 kg)  04/14/19 148 lb (67.1 kg)  02/28/19 140 lb (63.5 kg)    Glycemic control:   Lab Results  Component Value Date   HGBA1C 8.3 (A) 06/23/2019   HGBA1C 6.8 (H) 01/10/2019   HGBA1C 7.9 (H) 05/31/2018   Lab Results  Component Value Date   MICROALBUR 24.8 (H) 12/10/2017   LDLCALC 97 01/10/2019   CREATININE 0.76 06/06/2019   Lab Results  Component Value Date   MICRALBCREAT 6.5 12/10/2017    Lab Results  Component Value Date   FRUCTOSAMINE 244 08/02/2018   FRUCTOSAMINE 248 03/11/2018   FRUCTOSAMINE 239 12/10/2017   OTHER active issues including hypertension are discussed in review of systems   Allergies as of 06/23/2019  Reactions   Almond Meal Anaphylaxis, Swelling   Other Anaphylaxis   Almond Butter   Aspirin Nausea Only      Medication List       Accurate as of June 23, 2019 10:48 AM. If you have any questions, ask your nurse or doctor.        STOP taking these medications   Chantix Starting Month Pak 0.5 MG X 11 & 1 MG X 42 tablet Generic drug: varenicline Stopped by: Elayne Snare, MD   pioglitazone 15 MG tablet Commonly known as: Actos Stopped by: Elayne Snare, MD   varenicline 1 MG tablet Commonly known as: Chantix Continuing Month Pak Stopped by: Elayne Snare, MD     TAKE these medications   albuterol 108 (90 Base) MCG/ACT inhaler Commonly known as: VENTOLIN HFA Inhale 2 puffs into the lungs every 4 (four) hours as needed for wheezing or shortness of breath.   allopurinol 100 MG tablet Commonly known as: ZYLOPRIM TAKE ONE TABLET BY MOUTH  DAILY. START ON 04/28/2017.   amLODipine 5 MG tablet Commonly known as: NORVASC TAKE 1 TABLET BY MOUTH DAILY. NEEDS TO MAKE F/U APPT FOR ADDITIONAL REFILLS.   atorvastatin 20 MG tablet Commonly known as: LIPITOR TAKE 1 TABLET BY MOUTH EVERY DAY   colchicine 0.6 MG tablet Take 1 tablet (0.6 mg total) by mouth daily. What changed:   when to take this  reasons to take this   EPINEPHrine 0.3 mg/0.3 mL Soaj injection Commonly known as: EPI-PEN Insert into muscle once with any facial or neck swelling  Instruct in use   FLUoxetine 40 MG capsule Commonly known as: PROZAC TAKE 1 CAPSULE BY MOUTH DAILY.   glimepiride 1 MG tablet Commonly known as: AMARYL TAKE 1 TABLET BY MOUTH DAILY BEFORE SUPPER.   glucose blood test strip Use as instructed to monitor FSBS 3x daily. Dx: E11.65   Lancets Misc Use as instructed to monitor FSBS 3x daily. Dx: E11.65   metFORMIN 850 MG tablet Commonly known as: Glucophage Take 1 tablet (850 mg total) by mouth 2 (two) times daily with a meal.   mirtazapine 15 MG tablet Commonly known as: REMERON TAKE 1/2 TABLET BY MOUTH AT BEDTIME   One-A-Day Womens [employer] 1 each by mouth daily.   pantoprazole 40 MG tablet Commonly known as: PROTONIX TAKE 1 TABLET BY MOUTH EVERY DAY   spironolactone 50 MG tablet Commonly known as: ALDACTONE Take 1 tablet (50 mg total) by mouth daily.   SUMAtriptan 100 MG tablet Commonly known as: Imitrex Take 1 tablet (100 mg total) by mouth every 2 (two) hours as needed for migraine. May repeat in 2 hours if headache persists or recurs.   valsartan-hydrochlorothiazide 160-12.5 MG tablet Commonly known as: Diovan HCT Take 1 tablet by mouth daily.   zolpidem 10 MG tablet Commonly known as: Ambien Take 1 tablet (10 mg total) by mouth at bedtime as needed for sleep.       Allergies:  Allergies  Allergen Reactions  . Almond Meal Anaphylaxis and Swelling  . Other Anaphylaxis    Almond Butter  .  Aspirin Nausea Only    Past Medical History:  Diagnosis Date  . Adrenal adenoma    as per MRI 03/09/14  . Allergy   . Anxiety   . Coronary artery calcification seen on CAT scan 04/12/2018  . Depression   . Diabetes mellitus   . Diverticulosis   . Elevated LFTs    elevated since 09/2013  .  Fatty liver    moderate- as per 03/06/17 MRI liver  . GERD (gastroesophageal reflux disease)   . Gout   . H/O hiatal hernia   . Headache(784.0)   . History of depression   . Hyperlipemia   . Hyperplastic colon polyp   . Hypertension   . Positive hepatitis C antibody test    RNA NEGATIVE  . Thyroid nodule  managed Dr. Posey Pronto 02/27/2014  . Tobacco abuse   . Tubular adenoma of colon     Past Surgical History:  Procedure Laterality Date  . ANAL FISTULECTOMY  01/19/1985  . BREAST EXCISIONAL BIOPSY Left   . BREAST LUMPECTOMY Left 1988  . PAROTIDECTOMY  03/30/2011   Procedure: PAROTIDECTOMY;  Surgeon: Izora Gala, MD;  Location: [facility];  Service: ENT;  Laterality: Right;  . WRIST GANGLION EXCISION Left 1988    Family History  Problem Relation Age of Onset  . Coronary artery disease Father 65       CAD,CABG  . Hypertension Mother   . Stroke Mother   . Diabetes Sister        uncontrolled  . Cancer Sister 73       uterine cancer  . Aneurysm Sister        brain  . Heart attack Maternal Grandmother   . Blindness Paternal Grandmother   . Breast cancer Neg Hx   . Colon cancer Neg Hx   . Esophageal cancer Neg Hx   . Rectal cancer Neg Hx   . Stomach cancer Neg Hx     Social History:  reports that she has been smoking cigarettes. She has a 28.00 pack-year smoking history. She has never used smokeless tobacco. She reports current alcohol use of about 14.0 standard drinks of alcohol per week. She reports that she does not use drugs.   Review of Systems    Lipid history: Managed by PCP with atorvastatin 20 mg     Lab Results  Component Value Date   CHOL 176 01/10/2019     HDL 50.50 01/10/2019   LDLCALC 97 01/10/2019   TRIG 144.0 01/10/2019   CHOLHDL 3 01/10/2019           Hypertension: This has been present for several years Currently on valsartan HCTZ, was also on Norvasc and 50 mg Aldactone  Recently seen by cardiologist She has monitored blood pressure at work and it reads about 130/90-102  She appears to have run out of her spironolactone probably couple of months ago has also amlodipine and she did not call for refills  Potassium is again low She was told to collect a 24-hour urine for potassium but she did not do so  Has had a right adrenal adenoma diagnosed initially in 2009 and stable on further imaging subsequently including 01/2018 BP Readings from Last 3 Encounters:  06/23/19 134/90  04/14/19 (!) 142/92  03/29/19 127/83   Lab Results  Component Value Date   K 3.4 (L) 06/06/2019   Lab Results  Component Value Date   CREATININE 0.76 06/06/2019   CREATININE 0.69 03/28/2019   CREATININE 0.72 01/10/2019    Reportedly has fatty liver, last ALT normal  Lab Results  Component Value Date   ALT 28 01/10/2019    GOITER: She has had a goiter Thyroid levels were normal. Ultrasound previously showed a stable 7 mm nodule only in the left side, this was done in 12/2014  Lab Results  Component Value Date   TSH 0.61 06/06/2019  TSH 0.70 01/24/2018   TSH 0.57 11/06/2015   FREET4 1.06 06/06/2019   FREET4 0.9 11/06/2015   FREET4 1.24 01/01/2015    She is on Prozac from her PCP for depression and anxiety   LABS:  Office Visit on 06/23/2019  Component Date Value Ref Range Status  . Hemoglobin A1C 06/23/2019 8.3* 4.0 - 5.6 % Final    Physical Examination:  BP 134/90 (BP Location: Left Arm, Patient Position: Sitting, Cuff Size: Normal)   Pulse (!) 105   Ht 5\' 3"  (1.6 m)   Wt 150 lb 12.8 oz (68.4 kg)   SpO2 98%   BMI 26.71 kg/m    No ankle edema present    ASSESSMENT:  Diabetes type 2, on oral agents  See history  of present illness for detailed discussion of current diabetes management, blood sugar patterns and problems identified  Although her A1c was excellent at 6.8 it is now back up to 8.3  She appears to have higher glucose values with not taking her Actos and Jardiance as previously prescribed She now reports having yeast infections from Jardiance 25 mg Monitoring at home, diet adherence and exercise regimen has been minimal reflecting low motivation  Recent fasting glucose of 218 in the lab   HYPERTENSION: She may have secondary hypertension with hyperaldosteronism rather than renovascular disease  Currently not clear if her blood pressure was better when she was taking Aldactone along with her amlodipine and valsartan HCTZ Currently only taking valsartan HCTZ  Again has hypokalemia and history of adenoidal adenoma This was previously improved with taking Aldactone 50 mg  She did not do her work-up with urine potassium and aldosterone/renin ratio as ordered  Thyroid functions are normal  PLAN:     Restart 15 mg Actos Reduce Jardiance to 10 mg and combine with Metformin for better compliance and simplicity Tablet for now Start checking blood sugars after meals consistently and rotate before and after meals She will call if she has any side effects but continue Amaryl also Start walking regularly or start program at the gym   Restart Aldactone 50 mg daily for improved blood pressure and Restart AMLODIPINE 5 mg daily Renin aldosterone ratio today although she has taken her valsartan HCT this morning Encouraged her to check blood pressure regularly if able to at work If her blood pressure is continue to be difficult to control would consider checking aldosterone/renin ratio Needs more regular follow-up and will schedule her to come back on 7/14  Discussed that she may not need to have renal vascular ultrasound if her studies show hyperaldosteronism   Patient Instructions  Check  blood sugars on waking up 3 days a week  Also check blood sugars about 2 hours after meals and do this after different meals by rotation  Recommended blood sugar levels on waking up are 90-130 and about 2 hours after meal is 130-160  Please bring your blood sugar monitor to each visit, thank you  Walk daily   Synjardy has metformin and Jardiance     Elayne Snare 06/23/2019, 10:48 AM   Note: This office note was prepared with Dragon voice recognition system technology. Any transcriptional errors that result from this process are unintentional.

## 2019-06-23 NOTE — Patient Instructions (Addendum)
Check blood sugars on waking up 3 days a week  Also check blood sugars about 2 hours after meals and do this after different meals by rotation  Recommended blood sugar levels on waking up are 90-130 and about 2 hours after meal is 130-160  Please bring your blood sugar monitor to each visit, thank you  Walk daily   Synjardy has metformin and Jardiance

## 2019-06-29 LAB — ALDOSTERONE + RENIN ACTIVITY W/ RATIO
ALDOS/RENIN RATIO: 1 (ref 0.0–30.0)
ALDOSTERONE: 7.3 ng/dL (ref 0.0–30.0)
Renin: 7.558 ng/mL/hr — ABNORMAL HIGH (ref 0.167–5.380)

## 2019-07-05 ENCOUNTER — Ambulatory Visit (HOSPITAL_COMMUNITY)
Admission: RE | Admit: 2019-07-05 | Discharge: 2019-07-05 | Disposition: A | Discharge status: Home / Self Care | Payer: Commercial Managed Care - PPO | Source: Ambulatory Visit | Attending: Cardiovascular Disease | Admitting: Cardiovascular Disease

## 2019-07-05 ENCOUNTER — Other Ambulatory Visit: Payer: Self-pay

## 2019-07-05 DIAGNOSIS — I10 Essential (primary) hypertension: Secondary | ICD-10-CM | POA: Insufficient documentation

## 2019-07-07 ENCOUNTER — Other Ambulatory Visit: Payer: Self-pay

## 2019-07-07 DIAGNOSIS — I10 Essential (primary) hypertension: Secondary | ICD-10-CM

## 2019-08-01 NOTE — Progress Notes (Signed)
Patient ID: Stacy [name], female   DOB: 01-Jun-1965, 54 y.o.   MRN: [id number]             Reason for Appointment: Endocrinology follow-up    History of Present Illness:          Date of diagnosis of type 2 diabetes mellitus: 2014        Background history:    She apparently was asymptomatic at the time of diagnosis She was started on metformin but no details of initial management or A1c are available She thinks about 3 years ago she was started on Farxiga also with improved control and no side effects with this She has mostly been followed by a PCP but may have seen an endocrinologist also About 1-1/2-2 years ago she was also given basal insulin with Tyler Aas presumably for higher sugars but not clear what her A1c was at that time  Recent history:    Her A1c was last  6.8 and now 8.3   Non-insulin hypoglycemic drugs the patient is taking YQM:VHQIONGEX 850 mg twice daily, Actos 15 mg, Amaryl 2mg  at dinner  Current management, blood sugar patterns and problems identified:  She has been back on Actos since her visit about a month ago  She had stopped her Vania Rea also previously because of yeast infections and she was prescribed Synjardy  However she claims that the pharmacy did not give this to her even though prescription was received by the pharmacy, not clear if she had any issues with her co-pay card  However she previously was eating larger portions and gaining weight with Megace which has been now stopped  She said that her weight has decreased and recently with her bronchitis is eating less  She is again checking blood sugars only sporadically in the mornings and none after meals despite reminders  Not able to exercise for various reasons and also not motivated  Lab glucose today is 110 fasting        Side effects from medications have been: Diarrhea from metformin  Compliance with the medical regimen:  Fair Hypoglycemia: none   Glucose monitoring:  done  <1  times a day         Glucometer:  Accu-Chek      Blood Glucose readings by recall:  Recently 130-150 fasting  Previously: Range 168-183  Self-care: The diet that the patient has been following is: tries to limit fried food.            Dietician visit, most recent: None  Weight history:  Wt Readings from Last 3 Encounters:  08/02/19 141 lb 3.2 oz (64 kg)  08/02/19 141 lb (64 kg)  06/23/19 150 lb 12.8 oz (68.4 kg)    Glycemic control:   Lab Results  Component Value Date   HGBA1C 8.3 (A) 06/23/2019   HGBA1C 6.8 (H) 01/10/2019   HGBA1C 7.9 (H) 05/31/2018   Lab Results  Component Value Date   MICROALBUR 24.8 (H) 12/10/2017   LDLCALC 97 01/10/2019   CREATININE 0.76 06/06/2019   Lab Results  Component Value Date   MICRALBCREAT 6.5 12/10/2017    Lab Results  Component Value Date   FRUCTOSAMINE 244 08/02/2018   FRUCTOSAMINE 248 03/11/2018   FRUCTOSAMINE 239 12/10/2017   OTHER active issues including hypertension are discussed in review of systems   Allergies as of 08/02/2019      Reactions   Almond Meal Anaphylaxis, Swelling   Other Anaphylaxis   Almond Butter   Aspirin  Nausea Only      Medication List       Accurate as of August 02, 2019 10:51 AM. If you have any questions, ask your nurse or doctor.        albuterol 108 (90 Base) MCG/ACT inhaler Commonly known as: VENTOLIN HFA Inhale 2 puffs into the lungs every 4 (four) hours as needed for wheezing or shortness of breath.   allopurinol 100 MG tablet Commonly known as: ZYLOPRIM TAKE ONE TABLET BY MOUTH DAILY. START ON 04/28/2017.   amLODipine 5 MG tablet Commonly known as: NORVASC TAKE 1 TABLET BY MOUTH DAILY. NEEDS TO MAKE F/U APPT FOR ADDITIONAL REFILLS.   aspirin EC 81 MG tablet Take 1 tablet (81 mg total) by mouth daily. Swallow whole. Started by: Skeet Latch, MD   atorvastatin 20 MG tablet Commonly known as: LIPITOR TAKE 1 TABLET BY MOUTH EVERY DAY   azithromycin 250 MG tablet Commonly  known as: Zithromax Z-Pak Take two tablets for the first day- and one tablet for the next 4 days. Started by: Skeet Latch, MD   colchicine 0.6 MG tablet Take 1 tablet (0.6 mg total) by mouth daily. What changed:   when to take this  reasons to take this   EPINEPHrine 0.3 mg/0.3 mL Soaj injection Commonly known as: EPI-PEN Insert into muscle once with any facial or neck swelling  Instruct in use   FLUoxetine 40 MG capsule Commonly known as: PROZAC TAKE 1 CAPSULE BY MOUTH DAILY.   glimepiride 1 MG tablet Commonly known as: AMARYL TAKE 1 TABLET BY MOUTH DAILY BEFORE SUPPER.   glucose blood test strip Use as instructed to monitor FSBS 3x daily. Dx: E11.65   Lancets Misc Use as instructed to monitor FSBS 3x daily. Dx: E11.65   metFORMIN 850 MG tablet Commonly known as: Glucophage Take 1 tablet (850 mg total) by mouth 2 (two) times daily with a meal.   mirtazapine 15 MG tablet Commonly known as: REMERON TAKE 1/2 TABLET BY MOUTH AT BEDTIME   One-A-Day [employer]om[employer] 1 each by mouth daily.   pantoprazole 40 MG tablet Commonly known as: PROTONIX TAKE 1 TABLET BY MOUTH EVERY DAY   pioglitazone 15 MG tablet Commonly known as: Actos Take 1 tablet (15 mg total) by mouth daily.   spironolactone 50 MG tablet Commonly known as: ALDACTONE TAKE 1 TABLET BY MOUTH EVERY DAY   SUMAtriptan 100 MG tablet Commonly known as: Imitrex Take 1 tablet (100 mg total) by mouth every 2 (two) hours as needed for migraine. May repeat in 2 hours if headache persists or recurs.   Synjardy XR 05-998 MG Tb24 Generic drug: Empagliflozin-metFORMIN HCl ER Take 2 tablets by mouth daily.   valsartan-hydrochlorothiazide 160-12.5 MG tablet Commonly known as: Diovan HCT Take 1 tablet by mouth daily.   zolpidem 10 MG tablet Commonly known as: Ambien Take 1 tablet (10 mg total) by mouth at bedtime as needed for sleep.       Allergies:  Allergies  Allergen Reactions  .  Almond Meal Anaphylaxis and Swelling  . Other Anaphylaxis    Almond Butter  . Aspirin Nausea Only    Past Medical History:  Diagnosis Date  . Acute bronchitis 08/02/2019  . Adrenal adenoma    as per MRI 03/09/14  . Allergy   . Anxiety   . Coronary artery calcification seen on CAT scan 04/12/2018  . Depression   . Diabetes mellitus   . Diverticulosis   . Elevated LFTs  elevated since 09/2013  . Fatty liver    moderate- as per 03/06/17 MRI liver  . GERD (gastroesophageal reflux disease)   . Gout   . H/O hiatal hernia   . Headache(784.0)   . History of depression   . Hyperlipemia   . Hyperplastic colon polyp   . Hypertension   . Positive hepatitis C antibody test    RNA NEGATIVE  . Renal artery stenosis ([place]) 08/02/2019   06/2019: 1-59% stenosis on L   . Thyroid nodule  managed Dr. Posey Pronto 02/27/2014  . Tobacco abuse   . Tubular adenoma of colon     Past Surgical History:  Procedure Laterality Date  . ANAL FISTULECTOMY  01/19/1985  . BREAST EXCISIONAL BIOPSY Left   . BREAST LUMPECTOMY Left 1988  . PAROTIDECTOMY  03/30/2011   Procedure: PAROTIDECTOMY;  Surgeon: Izora Gala, MD;  Location: [facility];  Service: ENT;  Laterality: Right;  . WRIST GANGLION EXCISION Left 1988    Family History  Problem Relation Age of Onset  . Coronary artery disease Father 70       CAD,CABG  . Hypertension Mother   . Stroke Mother   . Diabetes Sister        uncontrolled  . Cancer Sister 65       uterine cancer  . Aneurysm Sister        brain  . Heart attack Maternal Grandmother   . Blindness Paternal Grandmother   . Breast cancer Neg Hx   . Colon cancer Neg Hx   . Esophageal cancer Neg Hx   . Rectal cancer Neg Hx   . Stomach cancer Neg Hx     Social History:  reports that she has been smoking cigarettes. She has a 28.00 pack-year smoking history. She has never used smokeless tobacco. She reports current alcohol use of about 14.0 standard drinks of alcohol per week. She  reports that she does not use drugs.   Review of Systems    Lipid history: Managed by PCP with atorvastatin 20 mg     Lab Results  Component Value Date   CHOL 176 01/10/2019   HDL 50.50 01/10/2019   LDLCALC 97 01/10/2019   TRIG 144.0 01/10/2019   CHOLHDL 3 01/10/2019           Hypertension: This has been present for several years Currently on valsartan HCTZ, also on Norvasc and 50 mg Aldactone Blood pressure appears improved  Also followed by cardiologist She was started on Aldactone on her last visit in June 21 Previously potassium was low and evaluation with aldosterone level was normal, she does tend to have a high renin level  She was told to collect a 24-hour urine for potassium but she did not do so  Has had a right adrenal adenoma diagnosed initially in 2009 and stable on further imaging subsequently including 01/2018  BP Readings from Last 3 Encounters:  08/02/19 132/82  08/02/19 130/88  06/23/19 134/90   Lab Results  Component Value Date   K 3.4 (L) 06/06/2019   Lab Results  Component Value Date   CREATININE 0.76 06/06/2019   CREATININE 0.69 03/28/2019   CREATININE 0.72 01/10/2019    Reportedly has fatty liver, last ALT normal  Lab Results  Component Value Date   ALT 28 01/10/2019    GOITER: She has had a goiter Thyroid levels were normal. Ultrasound previously showed a stable 7 mm nodule only in the left side, this was done in 12/2014  Lab Results  Component Value Date   TSH 0.61 06/06/2019   TSH 0.70 01/24/2018   TSH 0.57 11/06/2015   FREET4 1.06 06/06/2019   FREET4 0.9 11/06/2015   FREET4 1.24 01/01/2015    She is on Prozac and Remeron from her PCP for depression and anxiety   LABS:  Office Visit on 08/02/2019  Component Date Value Ref Range Status  . Glucose Fasting, POC 08/02/2019 110* 70 - 99 mg/dL Final    Physical Examination:  BP 132/82 (BP Location: Left Arm, Patient Position: Sitting, Cuff Size: Normal)   Pulse 98    Ht 5\' 3"  (1.6 m)   Wt 141 lb 3.2 oz (64 kg)   SpO2 98%   BMI 25.01 kg/m       ASSESSMENT:  Diabetes type 2, on oral agents  See history of present illness for detailed discussion of current diabetes management, blood sugar patterns and problems identified  Last A1c was 8.3  She has overall somewhat better readings although only a few fasting reading reports available Previously her fasting blood sugars were in the mid 100 range when A1c was high and likely has postprandial hyperglycemia. Since she has lost weight and is eating smaller portions blood sugars may be well controlled anyway without restarting her Jardiance that she apparently did not get from the pharmacy   HYPERTENSION: She does not appear to have secondary hypertension despite her hypokalemia Blood pressure is well controlled with adding the Norvasc and Aldactone again Labs are being checked today by her cardiology office Encourage her to monitor blood pressure regularly at work also   PLAN:     Continue 15 mg Actos Check fructosamine today If this is still relatively high will have her switch Metformin to [place] as previously planned She will continue to work with her PCP regarding decreased appetite Encouraged her to start walking for exercise when able to  There are no Patient Instructions on file for this visit.    Elayne Snare 08/02/2019, 10:51 AM   Note: This office note was prepared with Dragon voice recognition system technology. Any transcriptional errors that result from this process are unintentional.

## 2019-08-02 ENCOUNTER — Encounter: Payer: Self-pay | Admitting: Endocrinology

## 2019-08-02 ENCOUNTER — Ambulatory Visit (INDEPENDENT_AMBULATORY_CARE_PROVIDER_SITE_OTHER): Payer: Commercial Managed Care - PPO | Admitting: Endocrinology

## 2019-08-02 ENCOUNTER — Encounter: Payer: Self-pay | Admitting: Cardiovascular Disease

## 2019-08-02 ENCOUNTER — Other Ambulatory Visit: Payer: Self-pay

## 2019-08-02 ENCOUNTER — Ambulatory Visit (INDEPENDENT_AMBULATORY_CARE_PROVIDER_SITE_OTHER): Payer: Commercial Managed Care - PPO | Admitting: Cardiovascular Disease

## 2019-08-02 ENCOUNTER — Encounter: Payer: Self-pay | Admitting: Family Medicine

## 2019-08-02 VITALS — BP 132/82 | HR 98 | Ht 63.0 in | Wt 141.2 lb

## 2019-08-02 VITALS — BP 130/88 | HR 95 | Ht 63.0 in | Wt 141.0 lb

## 2019-08-02 DIAGNOSIS — Z720 Tobacco use: Secondary | ICD-10-CM

## 2019-08-02 DIAGNOSIS — I2510 Atherosclerotic heart disease of native coronary artery without angina pectoris: Secondary | ICD-10-CM

## 2019-08-02 DIAGNOSIS — E7800 Pure hypercholesterolemia, unspecified: Secondary | ICD-10-CM | POA: Diagnosis not present

## 2019-08-02 DIAGNOSIS — E1165 Type 2 diabetes mellitus with hyperglycemia: Secondary | ICD-10-CM

## 2019-08-02 DIAGNOSIS — I10 Essential (primary) hypertension: Secondary | ICD-10-CM | POA: Diagnosis not present

## 2019-08-02 DIAGNOSIS — E876 Hypokalemia: Secondary | ICD-10-CM | POA: Diagnosis not present

## 2019-08-02 DIAGNOSIS — J209 Acute bronchitis, unspecified: Secondary | ICD-10-CM

## 2019-08-02 DIAGNOSIS — I701 Atherosclerosis of renal artery: Secondary | ICD-10-CM

## 2019-08-02 DIAGNOSIS — D3501 Benign neoplasm of right adrenal gland: Secondary | ICD-10-CM

## 2019-08-02 DIAGNOSIS — R05 Cough: Secondary | ICD-10-CM

## 2019-08-02 DIAGNOSIS — R059 Cough, unspecified: Secondary | ICD-10-CM

## 2019-08-02 HISTORY — DX: Atherosclerosis of renal artery: I70.1

## 2019-08-02 HISTORY — DX: Acute bronchitis, unspecified: J20.9

## 2019-08-02 LAB — POCT CBG (FASTING - GLUCOSE)-MANUAL ENTRY: Glucose Fasting, POC: 110 mg/dL — AB (ref 70–99)

## 2019-08-02 MED ORDER — ALBUTEROL SULFATE HFA 108 (90 BASE) MCG/ACT IN AERS: 2.0000 | INHALATION_SPRAY | RESPIRATORY_TRACT | 1 refills | Status: DC | PRN

## 2019-08-02 MED ORDER — MIRTAZAPINE 15 MG PO TABS: ORAL_TABLET | ORAL | 2 refills | Status: DC

## 2019-08-02 MED ORDER — ASPIRIN EC 81 MG PO TBEC: 81.0000 mg | DELAYED_RELEASE_TABLET | Freq: Every day | ORAL | 3 refills | Status: DC

## 2019-08-02 MED ORDER — AZITHROMYCIN 250 MG PO TABS: ORAL_TABLET | ORAL | 0 refills | Status: DC

## 2019-08-02 NOTE — Patient Instructions (Signed)
Check blood sugars on waking up 2-3 days a week  Also check blood sugars about 2 hours after meals and do this after different meals by rotation  Recommended blood sugar levels on waking up are 90-130 and about 2 hours after meal is 130-160  Please bring your blood sugar monitor to each visit, thank you   

## 2019-08-02 NOTE — Progress Notes (Signed)
Cardiology Office Note     Evaluation Performed:  Follow-up visit  Date:  08/02/2019   ID:  PAITYNN MIKUS, DOB Mar 09, 1965, MRN [id number]  PCP:  Alycia Rossetti, MD  Cardiologist:  Skeet Latch, MD   Chief Complaint:  Atherosclerosis on CT  History of Present Illness:    Stacy [name] is a 54 y.o. female with mild CAD, hypertension, diabetes,fatty liver disease, elevated LFTs,and tobacco abusewho presents for follow up.  Stacy [name] saw Dr. Ihor Gully was referred for a CT of the chest, abdomen, and pelvis for unintentional weight lossand tobacco abuse. On that CT she was noted to have mild to moderate calcified plaque in the thoracic aorta and left anterior descending coronary artery calcification. Therefore she was referred to cardiology for further evaluation. [name] feeling well but not physically active. She had no exertional symptoms so prevention was stressed.  Her husband died unexpectedly of a heart attack a few years ago. He was the main cook.  She reported exertional dyspnea and was referred for coronary CT-A 03/29/2019 that revealed mild plaque in the LAD and minimal plaque in the left circumflex.  Atorvastatin was increased due to these findings.  Stacy [name] blood pressure was elevated.  Her PCP had recommended switching from HCTZ to amlodipine due to hypokalemia.  She did make this change and has found that her blood pressure continued to be elevated.  She also had swelling in her ankles.  She was switched to valsartan/HCTZ and amlodipine was reduced to 5 mg.  This has helped with the swelling.  At her last appointment her blood pressure remained above goal.  Valsartan was increased to 320 mg and hydrochlorothiazide 25 mg.  She had renal artery Dopplers that revealed mild to moderate blockage on the left and no obstruction on the right.  Labs were negative for hyperaldosteronism.  At her last endocrinology appointment her spironolactone was increased to  50 mg.  For the last two weeks she has been struggling with bronchitis.  She recently had to work an outdoor event for 8 hours and got very exhausted.  She has been having a lot of sputum production.  She had chills which have now resolved.  She denies fevers.  She has been increasingly short of breath.  She is using her inhaler without improvement.  At work when she checks her blood pressure is around 140/80-90.  She continues to smoke but has been smoking much less since she has had bronchitis.  She has not yet tried using the Chantix.    Prior CV studies:   The following studies were reviewed today:  Renal artery Dopplers 06/2019: Renal:    Right: Normal size right kidney. Normal right Resistive Index.     Normal cortical thickness of right kidney. No evidence of     right renal artery stenosis. RRV flow present.  Left: Normal size of left kidney. Normal left Resistive Index.     Normal cortical thickness of the left kidney. 1-59% stenosis     of the left renal artery. LRV flow present. Possible high end     range of 1-59% stenosed.   EKG:  08/02/19: Sinus rhythm.  Rate 95 bpm.  Past Medical History:  Diagnosis Date  . Acute bronchitis 08/02/2019  . Adrenal adenoma    as per MRI 03/09/14  . Allergy   . Anxiety   . Coronary artery calcification seen on CAT scan 04/12/2018  . Depression   . Diabetes mellitus   .  Diverticulosis   . Elevated LFTs    elevated since 09/2013  . Fatty liver    moderate- as per 03/06/17 MRI liver  . GERD (gastroesophageal reflux disease)   . Gout   . H/O hiatal hernia   . Headache(784.0)   . History of depression   . Hyperlipemia   . Hyperplastic colon polyp   . Hypertension   . Positive hepatitis C antibody test    RNA NEGATIVE  . Renal artery stenosis ([place]) 08/02/2019   06/2019: 1-59% stenosis on L   . Thyroid nodule  managed Dr. Posey Pronto 02/27/2014  . Tobacco abuse   . Tubular adenoma of colon    Past Surgical History:    Procedure Laterality Date  . ANAL FISTULECTOMY  01/19/1985  . BREAST EXCISIONAL BIOPSY Left   . BREAST LUMPECTOMY Left 1988  . PAROTIDECTOMY  03/30/2011   Procedure: PAROTIDECTOMY;  Surgeon: Izora Gala, MD;  Location: [facility];  Service: ENT;  Laterality: Right;  . WRIST GANGLION EXCISION Left 1988     No outpatient medications have been marked as taking for the 08/02/19 encounter (Office Visit) with Skeet Latch, MD.     Allergies:   Almond meal, Other, and Aspirin   Social History   Tobacco Use  . Smoking status: Current Every Day Smoker    Packs/day: 1.00    Years: 28.00    Pack years: 28.00    Types: Cigarettes  . Smokeless tobacco: Never Used  Vaping Use  . Vaping Use: Never used  Substance Use Topics  . Alcohol use: Yes    Alcohol/week: 14.0 standard drinks    Types: 14 Standard drinks or equivalent per week  . Drug use: No     Family Hx: The patient's family history includes Aneurysm in her sister; Blindness in her paternal grandmother; Cancer (age of onset: 78) in her sister; Coronary artery disease (age of onset: 72) in her father; Diabetes in her sister; Heart attack in her maternal grandmother; Hypertension in her mother; Stroke in her mother. There is no history of Breast cancer, Colon cancer, Esophageal cancer, Rectal cancer, or Stomach cancer.  ROS:   Please see the history of present illness.     All other systems reviewed and are negative.   Labs/Other Tests and Data Reviewed:    Recent Labs: 10/03/2018: Hemoglobin 12.8; Platelets 429 01/10/2019: ALT 28 06/06/2019: BUN 17; Creatinine, Ser 0.76; Potassium 3.4; Sodium 138; TSH 0.61   Recent Lipid Panel Lab Results  Component Value Date/Time   CHOL 176 01/10/2019 09:18 AM   TRIG 144.0 01/10/2019 09:18 AM   HDL 50.50 01/10/2019 09:18 AM   CHOLHDL 3 01/10/2019 09:18 AM   LDLCALC 97 01/10/2019 09:18 AM    Wt Readings from Last 3 Encounters:  08/02/19 141 lb 3.2 oz (64 kg)   08/02/19 141 lb (64 kg)  06/23/19 150 lb 12.8 oz (68.4 kg)     Exam:    VS:  BP 130/88   Pulse 95   Ht 5\' 3"  (1.6 m)   Wt 141 lb (64 kg)   BMI 24.98 kg/m  , BMI Body mass index is 24.98 kg/m. GENERAL:  Well appearing.  Voice hoarse.  Coughing. HEENT: Pupils equal round and reactive, fundi not visualized, oral mucosa unremarkable NECK:  No jugular venous distention, waveform within normal limits, carotid upstroke brisk and symmetric, no bruits, no thyromegaly LYMPHATICS:  No cervical adenopathy LUNGS:  Clear to auscultation bilaterally HEART:  RRR.  PMI not  displaced or sustained,S1 and S2 within normal limits, no S3, no S4, no clicks, no rubs, no murmurs ABD:  Flat, positive bowel sounds normal in frequency in pitch, no bruits, no rebound, no guarding, no midline pulsatile mass, no hepatomegaly, no splenomegaly EXT:  1 plus pulses throughout, no edema, no cyanosis no clubbing SKIN:  No rashes no nodules NEURO:  Cranial nerves II through XII grossly intact, motor grossly intact throughout PSYCH:  Cognitively intact, oriented to person place and time  ASSESSMENT & PLAN:    # Coronary and thoracic aorta calcifcation: # Pure hypercholesterolemia: # Shortness of breath: Coronary CT-A 03/2019 revealed nonobstructive disease.  However she is in the 98 percentile for calcium score.    We are treating her aggressively for primary prevention.  Atorvastatin was increased and we will repeat lipids/CMP today.  Add aspirin 81mg .   # Bronchitis:  CXR, z-pack, and albuterol inhaler  # Renal artery stenosis:  1-59% stenosis on the L.  Repeat in one year.  Start aspirin 81mg .   # Hypertension:  BP was initially mildly elevated but better on repeat.  Continue amlodipine, spironolactone, valsartan and HCTZ.  # Tobacco abuse: Ms. Givhan was encouraged to start using her Chantix.  We discussed the fact that this has likely contributed to her atherosclerosis.   Time:   Today, I have spent  45 minutes with the patient with telehealth technology discussing abnormal CT findings, hypertension.     Medication Adjustments/Labs and Tests Ordered: Current medicines are reviewed at length with the patient today.  Concerns regarding medicines are outlined above.    Tests Ordered: Orders Placed This Encounter  Procedures  . DG Chest 2 View  . Lipid panel  . Comprehensive metabolic panel  . CBC  . EKG 12-Lead  . VAS US RENAL ARTERY DUPLEX   Medication Changes: Meds ordered this encounter  Medications  . azithromycin (ZITHROMAX Z-PAK) 250 MG tablet    Sig: Take two tablets for the first day- and one tablet for the next 4 days.    Dispense:  6 each    Refill:  0  . aspirin EC 81 MG tablet    Sig: Take 1 tablet (81 mg total) by mouth daily. Swallow whole.    Dispense:  90 tablet    Refill:  3  . albuterol (VENTOLIN HFA) 108 (90 Base) MCG/ACT inhaler    Sig: Inhale 2 puffs into the lungs every 4 (four) hours as needed for wheezing or shortness of breath.    Dispense:  6.7 g    Refill:  1    Disposition:  Follow up with Tamberly Pomplun C. Oval Linsey, MD, [facility] in 4 months  Signed, Skeet Latch, MD  08/02/2019 10:44 AM    [address] [place], [place],   [address] Phone: [phone]; Fax: [phone]

## 2019-08-02 NOTE — Patient Instructions (Signed)
Medication Instructions:  Start Z-PAK-  Take two tablets for the first day, then one tablet for the next four days.  Start baby Aspirin 81 mg daily   *If you need a refill on your cardiac medications before your next appointment, please call your pharmacy*   Lab Work: LIPID, CMET, CBC today   If you have labs (blood work) drawn today and your tests are completely normal, you will receive your results only by: Marland Kitchen MyChart Message (if you have MyChart) OR . A paper copy in the mail If you have any lab test that is abnormal or we need to change your treatment, we will call you to review the results.   Testing/Procedures: Chest xray - Your physician has requested that you have a chest xray, is a fast and painless imaging test that uses certain electromagnetic waves to create pictures of the structures in and around your chest. This test can help diagnose and monitor conditions such as pneumonia and other lung issues his will be done at [facility], [place]. If you should need to call them their phone number is [phone].  Your physician has requested that you have a renal artery duplex. During this test, an ultrasound is used to evaluate blood flow to the kidneys. Allow one hour for this exam. Do not eat after midnight the day before and avoid carbonated beverages. Take your medications as you usually do.    Follow-Up: At [facility], you and your health needs are our priority.  As part of our continuing mission to provide you with exceptional heart care, we have created designated Provider Care Teams.  These Care Teams include your primary Cardiologist (physician) and Advanced Practice Providers (APPs -  Physician Assistants and Nurse Practitioners) who all work together to provide you with the care you need, when you need it.  We recommend signing up for the patient portal called "MyChart".  Sign up information is provided on this After Visit Summary.  MyChart  is used to connect with patients for Virtual Visits (Telemedicine).  Patients are able to view lab/test results, encounter notes, upcoming appointments, etc.  Non-urgent messages can be sent to your provider as well.   To learn more about what you can do with MyChart, go to [email or web address].    Your next appointment:   4 month(s)  The format for your next appointment:   In Person  Provider:   Skeet Latch, MD

## 2019-08-03 LAB — COMPREHENSIVE METABOLIC PANEL
ALT: 41 IU/L — ABNORMAL HIGH (ref 0–32)
AST: 44 IU/L — ABNORMAL HIGH (ref 0–40)
Albumin/Globulin Ratio: 1.3 (ref 1.2–2.2)
Albumin: 4.3 g/dL (ref 3.8–4.9)
Alkaline Phosphatase: 96 IU/L (ref 48–121)
BUN/Creatinine Ratio: 20 (ref 9–23)
BUN: 19 mg/dL (ref 6–24)
Bilirubin Total: 0.4 mg/dL (ref 0.0–1.2)
CO2: 24 mmol/L (ref 20–29)
Calcium: 10 mg/dL (ref 8.7–10.2)
Chloride: 97 mmol/L (ref 96–106)
Creatinine, Ser: 0.96 mg/dL (ref 0.57–1.00)
GFR calc Af Amer: 78 mL/min/{1.73_m2} (ref >59)
GFR calc non Af Amer: 67 mL/min/{1.73_m2} (ref >59)
Globulin, Total: 3.4 g/dL (ref 1.5–4.5)
Glucose: 105 mg/dL — ABNORMAL HIGH (ref 65–99)
Potassium: 4.1 mmol/L (ref 3.5–5.2)
Sodium: 139 mmol/L (ref 134–144)
Total Protein: 7.7 g/dL (ref 6.0–8.5)

## 2019-08-03 LAB — LIPID PANEL
Chol/HDL Ratio: 3.3 ratio (ref 0.0–4.4)
Cholesterol, Total: 208 mg/dL — ABNORMAL HIGH (ref 100–199)
HDL: 63 mg/dL (ref >39)
LDL Chol Calc (NIH): 127 mg/dL — ABNORMAL HIGH (ref 0–99)
Triglycerides: 104 mg/dL (ref 0–149)
VLDL Cholesterol Cal: 18 mg/dL (ref 5–40)

## 2019-08-03 LAB — CBC
Hematocrit: 37.1 % (ref 34.0–46.6)
Hemoglobin: 12.5 g/dL (ref 11.1–15.9)
MCH: 27.5 pg (ref 26.6–33.0)
MCHC: 33.7 g/dL (ref 31.5–35.7)
MCV: 82 fL (ref 79–97)
Platelets: 325 10*3/uL (ref 150–450)
RBC: 4.55 x10E6/uL (ref 3.77–5.28)
RDW: 15.2 % (ref 11.7–15.4)
WBC: 7.7 10*3/uL (ref 3.4–10.8)

## 2019-08-03 LAB — FRUCTOSAMINE: Fructosamine: 233 umol/L (ref 0–285)

## 2019-08-30 ENCOUNTER — Encounter (HOSPITAL_COMMUNITY): Payer: Commercial Managed Care - PPO

## 2019-09-28 ENCOUNTER — Other Ambulatory Visit: Payer: Self-pay | Admitting: Family Medicine

## 2019-09-29 NOTE — Telephone Encounter (Signed)
Ok to refill??  Last office visit 02/28/2019.  Last refill 01/23/2019, #2 refills.

## 2019-10-12 ENCOUNTER — Telehealth: Payer: Self-pay | Admitting: Cardiovascular Disease

## 2019-10-12 NOTE — Telephone Encounter (Signed)
Pt aware of lab results and recommendations will forward message to pharmacy to make arrangements for PCSK9       Dr Oval Linsey has reviewed your labs, her comments below. Her cholesterol is improving but still not at goal. Liver enzymes are elevated as well. Reduce atorvastatin back to 40mg . Please have her see the pharmacists to consider PCSK9 inhibitor. I tried to call but your voicemail would not record a message. Please call the office Monday-Friday .Marland KitchenMarland Kitchen

## 2019-10-12 NOTE — Telephone Encounter (Signed)
Patient calling for her lab results from July.

## 2019-10-12 NOTE — Telephone Encounter (Signed)
Please call to schedule in lipid clinic

## 2019-10-12 NOTE — Telephone Encounter (Signed)
Called and scheduled for lipid clinic 10/18 at [facility]

## 2019-10-26 ENCOUNTER — Other Ambulatory Visit: Payer: Self-pay | Admitting: Endocrinology

## 2019-11-06 ENCOUNTER — Ambulatory Visit: Payer: Commercial Managed Care - PPO

## 2019-11-06 NOTE — Progress Notes (Deleted)
11/06/2019 Stacy [name] [birth year] [id number]   HPI:  Stacy [name] is a 54 y.o. female patient of Dr Oval Linsey, who presents today for a lipid clinic evaluation.  See pertinent past medical history below.    Past Medical History: ASCVD Coronary calcium score 50.4 - 94th percentile for age/sex, mild calcification of LAD  hypertension Diastolic elevation - on amlodipine 5, spironolactone 50, valsartan hctz 160/12.5  Renal artery stenosis High end range 1-59% left RAS  Fatty liver disease Mild chronic patent steatosis  DM2 12/20 A1c 6.8, down from 7.9, on metformin/empagliflozin, glimepiride, pioglitazone  gout Last flare **, on colchicine 0.6 daily    Current Medications:  Cholesterol Goals:   Intolerant/previously tried:  Family history:   Diet:   Exercise:    Labs:    Current Outpatient Medications  Medication Sig Dispense Refill  . zolpidem (AMBIEN) 10 MG tablet TAKE 1 TABLET BY MOUTH AT BEDTIME AS NEEDED FOR SLEEP. 30 tablet 2  . albuterol (VENTOLIN HFA) 108 (90 Base) MCG/ACT inhaler Inhale 2 puffs into the lungs every 4 (four) hours as needed for wheezing or shortness of breath. 6.7 g 1  . allopurinol (ZYLOPRIM) 100 MG tablet TAKE ONE TABLET BY MOUTH DAILY. START ON 04/28/2017. 30 tablet 6  . amLODipine (NORVASC) 5 MG tablet TAKE 1 TABLET BY MOUTH DAILY. NEEDS TO MAKE F/U APPT FOR ADDITIONAL REFILLS. 30 tablet 1  . aspirin EC 81 MG tablet Take 1 tablet (81 mg total) by mouth daily. Swallow whole. 90 tablet 3  . atorvastatin (LIPITOR) 20 MG tablet TAKE 1 TABLET BY MOUTH EVERY DAY 90 tablet 3  . azithromycin (ZITHROMAX Z-PAK) 250 MG tablet Take two tablets for the first day- and one tablet for the next 4 days. 6 each 0  . colchicine 0.6 MG tablet Take 1 tablet (0.6 mg total) by mouth daily. (Patient taking differently: Take 0.6 mg by mouth as needed. ) 30 tablet 6  . Empagliflozin-metFORMIN HCl ER (SYNJARDY XR) 05-998 MG TB24 Take 2 tablets by mouth daily. 60 tablet 1  .  EPINEPHrine 0.3 mg/0.3 mL IJ SOAJ injection Insert into muscle once with any facial or neck swelling  Instruct in use 1 Device 1  . FLUoxetine (PROZAC) 40 MG capsule TAKE 1 CAPSULE BY MOUTH DAILY. 90 capsule 2  . glimepiride (AMARYL) 1 MG tablet TAKE 1 TABLET BY MOUTH DAILY BEFORE SUPPER. 90 tablet 0  . glucose blood test strip Use as instructed to monitor FSBS 3x daily. Dx: E11.65 200 each 12  . Lancets MISC Use as instructed to monitor FSBS 3x daily. Dx: E11.65 100 each 11  . metFORMIN (GLUCOPHAGE) 850 MG tablet Take 1 tablet (850 mg total) by mouth 2 (two) times daily with a meal. 180 tablet 3  . mirtazapine (REMERON) 15 MG tablet TAKE 1/2 TABLET BY MOUTH AT BEDTIME 45 tablet 2  . Multiple Vitamins-Minerals (ONE-A-DAY WOMENS VITACRAVES) CHEW Chew 1 each by mouth daily.    . pantoprazole (PROTONIX) 40 MG tablet TAKE 1 TABLET BY MOUTH EVERY DAY 90 tablet 1  . pioglitazone (ACTOS) 15 MG tablet 1 tab qd, need to follow up in office 30 tablet 0  . spironolactone (ALDACTONE) 50 MG tablet TAKE 1 TABLET BY MOUTH EVERY DAY 90 tablet 1  . SUMAtriptan (IMITREX) 100 MG tablet Take 1 tablet (100 mg total) by mouth every 2 (two) hours as needed for migraine. May repeat in 2 hours if headache persists or recurs. 9 tablet 0  . valsartan-hydrochlorothiazide (DIOVAN  HCT) 160-12.5 MG tablet Take 1 tablet by mouth daily. 90 tablet 3   No current facility-administered medications for this visit.    Allergies  Allergen Reactions  . Almond Meal Anaphylaxis and Swelling  . Other Anaphylaxis    Almond Butter  . Aspirin Nausea Only    Past Medical History:  Diagnosis Date  . Acute bronchitis 08/02/2019  . Adrenal adenoma    as per MRI 03/09/14  . Allergy   . Anxiety   . Coronary artery calcification seen on CAT scan 04/12/2018  . Depression   . Diabetes mellitus   . Diverticulosis   . Elevated LFTs    elevated since 09/2013  . Fatty liver    moderate- as per 03/06/17 MRI liver  . GERD (gastroesophageal  reflux disease)   . Gout   . H/O hiatal hernia   . Headache(784.0)   . History of depression   . Hyperlipemia   . Hyperplastic colon polyp   . Hypertension   . Positive hepatitis C antibody test    RNA NEGATIVE  . Renal artery stenosis ([place]) 08/02/2019   06/2019: 1-59% stenosis on L   . Thyroid nodule  managed Dr. Posey Pronto 02/27/2014  . Tobacco abuse   . Tubular adenoma of colon     There were no vitals taken for this visit.   No problem-specific Assessment & Plan notes found for this encounter.   Tommy Medal PharmD CPP [facility] [address] [place], [place] [address] [phone]

## 2019-11-16 ENCOUNTER — Ambulatory Visit: Payer: Commercial Managed Care - PPO

## 2019-11-22 ENCOUNTER — Other Ambulatory Visit: Payer: Self-pay | Admitting: Cardiovascular Disease

## 2019-11-23 DIAGNOSIS — L720 Epidermal cyst: Secondary | ICD-10-CM | POA: Insufficient documentation

## 2019-11-25 DIAGNOSIS — L720 Epidermal cyst: Secondary | ICD-10-CM | POA: Insufficient documentation

## 2019-12-04 ENCOUNTER — Ambulatory Visit (INDEPENDENT_AMBULATORY_CARE_PROVIDER_SITE_OTHER): Payer: Commercial Managed Care - PPO | Admitting: Pharmacist Clinician (PhC)/ Clinical Pharmacy Specialist

## 2019-12-04 VITALS — BP 128/88 | HR 101 | Resp 16 | Ht 63.0 in | Wt 147.4 lb

## 2019-12-04 DIAGNOSIS — E1165 Type 2 diabetes mellitus with hyperglycemia: Secondary | ICD-10-CM | POA: Diagnosis not present

## 2019-12-04 DIAGNOSIS — E7800 Pure hypercholesterolemia, unspecified: Secondary | ICD-10-CM | POA: Diagnosis not present

## 2019-12-04 NOTE — Progress Notes (Signed)
12/06/2019 Stacy [name] 12/28/1965 [id number]   HPI:  Stacy [name] is a 54 y.o. female patient of Dr Oval Linsey, who presents today for a lipid clinic evaluation.  See pertinent past medical history below.  She was originally referred to cardiology after a chest CT found her to have mild to moderate calcified plaque.  A coronary calcium CT showed her to have a score of 50.4, putting her in the 94th percentile for age/sex.  Patient also has been having issues with hypertension, which is being followed by endocrinology.    After recent lipid labs it was noted that her liver enzymes were slightly elevated.  She was asked to decrease atorvastatin from 40 mg to 20 mg daily.  However, she was only on the 20 mg dose, so has continued this.    Past Medical History: ASCVD Coronary calcium score 50.4 - 94th percentile for age/sex  RAS  1-59% RAS on left  DM2 6/21 A1c 8.3, previously 6.8 - glimepiride, metforming, empagliflozin, pioglitazone  Tobacco abuse Smokes 1 ppd, 20 years, would like information on stopping  migraines Uses sumatriptan prn    Current Medications: atorvastatin 20 mg  Cholesterol Goals: LDL < 100   Intolerant/previously tried: none  Family history: father with CABG at 62 died at 40 cancer, mother had stroke;  died last year, developed Joellyn Quails, had stroke, did not recover; brother with DM1, sister with DM2, sister with cervical cancer, brain anerysms (doing well); 2 sons  Diet: 1/ 1/2, when eating out makes ggood choices, no deep fried; likes [place] foods; more fruits than veggies  Exercise:  Walks lot a work;   Labs: 7/21: TC 208, TG 104, HDL 63, LDL 127   Current Outpatient Medications  Medication Sig Dispense Refill  . albuterol (VENTOLIN HFA) 108 (90 Base) MCG/ACT inhaler Inhale 2 puffs into the lungs every 4 (four) hours as needed for wheezing or shortness of breath. 6.7 g 1  . allopurinol (ZYLOPRIM) 100 MG tablet TAKE ONE TABLET BY MOUTH DAILY. START ON  04/28/2017. 30 tablet 6  . amLODipine (NORVASC) 5 MG tablet TAKE 1 TABLET BY MOUTH DAILY. NEEDS TO MAKE F/U APPT FOR ADDITIONAL REFILLS. 30 tablet 1  . aspirin EC 81 MG tablet Take 1 tablet (81 mg total) by mouth daily. Swallow whole. 90 tablet 3  . atorvastatin (LIPITOR) 20 MG tablet TAKE 1 TABLET BY MOUTH EVERY DAY 90 tablet 3  . colchicine 0.6 MG tablet Take 1 tablet (0.6 mg total) by mouth daily. (Patient taking differently: Take 0.6 mg by mouth as needed. ) 30 tablet 6  . Empagliflozin-metFORMIN HCl ER (SYNJARDY XR) 05-998 MG TB24 Take 2 tablets by mouth daily. 60 tablet 1  . EPINEPHrine 0.3 mg/0.3 mL IJ SOAJ injection Insert into muscle once with any facial or neck swelling  Instruct in use 1 Device 1  . FLUoxetine (PROZAC) 40 MG capsule TAKE 1 CAPSULE BY MOUTH DAILY. 90 capsule 2  . glimepiride (AMARYL) 1 MG tablet TAKE 1 TABLET BY MOUTH DAILY BEFORE SUPPER. 90 tablet 0  . glucose blood test strip Use as instructed to monitor FSBS 3x daily. Dx: E11.65 200 each 12  . Lancets MISC Use as instructed to monitor FSBS 3x daily. Dx: E11.65 100 each 11  . metFORMIN (GLUCOPHAGE) 850 MG tablet Take 1 tablet (850 mg total) by mouth 2 (two) times daily with a meal. 180 tablet 3  . mirtazapine (REMERON) 15 MG tablet TAKE 1/2 TABLET BY MOUTH AT BEDTIME 45 tablet  2  . Multiple Vitamins-Minerals (ONE-A-DAY WOMENS VITACRAVES) CHEW Chew 1 each by mouth daily.    . pantoprazole (PROTONIX) 40 MG tablet TAKE 1 TABLET BY MOUTH EVERY DAY 90 tablet 1  . pioglitazone (ACTOS) 15 MG tablet 1 tab qd, need to follow up in office 30 tablet 0  . spironolactone (ALDACTONE) 50 MG tablet TAKE 1 TABLET BY MOUTH EVERY DAY 90 tablet 1  . SUMAtriptan (IMITREX) 100 MG tablet Take 1 tablet (100 mg total) by mouth every 2 (two) hours as needed for migraine. May repeat in 2 hours if headache persists or recurs. 9 tablet 0  . valsartan-hydrochlorothiazide (DIOVAN-HCT) 160-12.5 MG tablet TAKE 1 TABLET BY MOUTH EVERY DAY 90 tablet 1  .  zolpidem (AMBIEN) 10 MG tablet TAKE 1 TABLET BY MOUTH AT BEDTIME AS NEEDED FOR SLEEP. 30 tablet 2   No current facility-administered medications for this visit.    Allergies  Allergen Reactions  . Almond Meal Anaphylaxis and Swelling  . Other Anaphylaxis    Almond Butter  . Aspirin Nausea Only    Past Medical History:  Diagnosis Date  . Acute bronchitis 08/02/2019  . Adrenal adenoma    as per MRI 03/09/14  . Allergy   . Anxiety   . Coronary artery calcification seen on CAT scan 04/12/2018  . Depression   . Diabetes mellitus   . Diverticulosis   . Elevated LFTs    elevated since 09/2013  . Fatty liver    moderate- as per 03/06/17 MRI liver  . GERD (gastroesophageal reflux disease)   . Gout   . H/O hiatal hernia   . Headache(784.0)   . History of depression   . Hyperlipemia   . Hyperplastic colon polyp   . Hypertension   . Positive hepatitis C antibody test    RNA NEGATIVE  . Renal artery stenosis ([place]) 08/02/2019   06/2019: 1-59% stenosis on L   . Thyroid nodule  managed Dr. Posey Pronto 02/27/2014  . Tobacco abuse   . Tubular adenoma of colon     Blood pressure 128/88, pulse (!) 101, resp. rate 16, height 5\' 3"  (1.6 m), weight 147 lb 6.4 oz (66.9 kg), SpO2 98 %.   Hyperlipemia Patient with elevated LDL level and history of fatty liver disease.  Will repeat labs today to check liver function and see if still elevated.  If not, will have her increase the atorvastatin to 40 mg daily and have her repeat liver function labs in 4-6 weeks.  If she is close to goal at that time, we may consider adding ezetimibe 10 mg daily to reach a final goal.     Tommy Medal PharmD CPP [facility] [address] [place], [place] [address] [phone]

## 2019-12-04 NOTE — Patient Instructions (Signed)
Your Results:             Your most recent labs Goal  Total Cholesterol 208 < 200  Triglycerides 104 < 150  HDL (happy/good cholesterol) 63 > 40  LDL (lousy/bad cholesterol 127 < 70     Medication changes:  We will repeat labs today to check liver function.  If it is good, we can increase the atorvastatin to 40 mg daily.    Work on lifestyle modifications - increase exercise and watch diet.   Cholesterol Content in Foods Cholesterol is a waxy, fat-like substance that helps to carry fat in the blood. The body needs cholesterol in small amounts, but too much cholesterol can cause damage to the arteries and heart. Most people should eat less than 200 milligrams (mg) of cholesterol a day. Foods with cholesterol  Cholesterol is found in animal-based foods, such as meat, seafood, and dairy. Generally, low-fat dairy and lean meats have less cholesterol than full-fat dairy and fatty meats. The milligrams of cholesterol per serving (mg per serving) of common cholesterol-containing foods are listed below. Meat and other proteins  Egg -- one large whole egg has 186 mg.  Veal shank -- 4 oz has 141 mg.  Lean ground [place] (93% lean) -- 4 oz has 118 mg.  Fat-trimmed lamb loin -- 4 oz has 106 mg.  Lean ground beef (90% lean) -- 4 oz has 100 mg.  Lobster -- 3.5 oz has 90 mg.  Pork loin chops -- 4 oz has 86 mg.  Canned salmon -- 3.5 oz has 83 mg.  Fat-trimmed beef top loin -- 4 oz has 78 mg.  Frankfurter -- 1 frank (3.5 oz) has 77 mg.  Crab -- 3.5 oz has 71 mg.  Roasted chicken without skin, [place] meat -- 4 oz has 66 mg.  Light bologna -- 2 oz has 45 mg.  Deli-cut [place] -- 2 oz has 31 mg.  Canned tuna -- 3.5 oz has 31 mg.  Berniece Salines -- 1 oz has 29 mg.  Oysters and mussels (raw) -- 3.5 oz has 25 mg.  Mackerel -- 1 oz has 22 mg.  Trout -- 1 oz has 20 mg.  Pork sausage -- 1 link (1 oz) has 17 mg.  Salmon -- 1 oz has 16 mg.  Tilapia -- 1 oz has 14 mg. Dairy  Soft-serve ice  cream --  cup (4 oz) has 103 mg.  Whole-milk yogurt -- 1 cup (8 oz) has 29 mg.  Cheddar cheese -- 1 oz has 28 mg.  American cheese -- 1 oz has 28 mg.  Whole milk -- 1 cup (8 oz) has 23 mg.  2% milk -- 1 cup (8 oz) has 18 mg.  Cream cheese -- 1 tablespoon (Tbsp) has 15 mg.  Cottage cheese --  cup (4 oz) has 14 mg.  Low-fat (1%) milk -- 1 cup (8 oz) has 10 mg.  Sour cream -- 1 Tbsp has 8.5 mg.  Low-fat yogurt -- 1 cup (8 oz) has 8 mg.  Nonfat Greek yogurt -- 1 cup (8 oz) has 7 mg.  Half-and-half cream -- 1 Tbsp has 5 mg. Fats and oils  Cod liver oil -- 1 tablespoon (Tbsp) has 82 mg.  Butter -- 1 Tbsp has 15 mg.  Lard -- 1 Tbsp has 14 mg.  Bacon grease -- 1 Tbsp has 14 mg.  Mayonnaise -- 1 Tbsp has 5-10 mg.  Margarine -- 1 Tbsp has 3-10 mg. Exact amounts of cholesterol in  these foods may vary depending on specific ingredients and brands. Foods without cholesterol Most plant-based foods do not have cholesterol unless you combine them with a food that has cholesterol. Foods without cholesterol include:  Grains and cereals.  Vegetables.  Fruits.  Vegetable oils, such as olive, canola, and sunflower oil.  Legumes, such as peas, beans, and lentils.  Nuts and seeds.  Egg whites. Summary  The body needs cholesterol in small amounts, but too much cholesterol can cause damage to the arteries and heart.  Most people should eat less than 200 milligrams (mg) of cholesterol a day. This information is not intended to replace advice given to you by your health care provider. Make sure you discuss any questions you have with your health care provider. Document Revised: 12/18/2016 Document Reviewed: 09/01/2016 Elsevier Patient Education  2020 [employer].   Thank you for choosing [facility]

## 2019-12-05 LAB — COMPREHENSIVE METABOLIC PANEL
ALT: 29 IU/L (ref 0–32)
AST: 35 IU/L (ref 0–40)
Albumin/Globulin Ratio: 1.2 (ref 1.2–2.2)
Albumin: 4.4 g/dL (ref 3.8–4.9)
Alkaline Phosphatase: 119 IU/L (ref 44–121)
BUN/Creatinine Ratio: 14 (ref 9–23)
BUN: 13 mg/dL (ref 6–24)
Bilirubin Total: 0.5 mg/dL (ref 0.0–1.2)
CO2: 25 mmol/L (ref 20–29)
Calcium: 10.3 mg/dL — ABNORMAL HIGH (ref 8.7–10.2)
Chloride: 95 mmol/L — ABNORMAL LOW (ref 96–106)
Creatinine, Ser: 0.94 mg/dL (ref 0.57–1.00)
GFR calc Af Amer: 79 mL/min/{1.73_m2} (ref >59)
GFR calc non Af Amer: 69 mL/min/{1.73_m2} (ref >59)
Globulin, Total: 3.6 g/dL (ref 1.5–4.5)
Glucose: 137 mg/dL — ABNORMAL HIGH (ref 65–99)
Potassium: 4 mmol/L (ref 3.5–5.2)
Sodium: 137 mmol/L (ref 134–144)
Total Protein: 8 g/dL (ref 6.0–8.5)

## 2019-12-05 LAB — HEMOGLOBIN A1C
Est. average glucose Bld gHb Est-mCnc: 163 mg/dL
Hgb A1c MFr Bld: 7.3 % — ABNORMAL HIGH (ref 4.8–5.6)

## 2019-12-06 ENCOUNTER — Encounter: Payer: Self-pay | Admitting: Pharmacist Clinician (PhC)/ Clinical Pharmacy Specialist

## 2019-12-06 ENCOUNTER — Telehealth: Payer: Self-pay

## 2019-12-06 DIAGNOSIS — E7800 Pure hypercholesterolemia, unspecified: Secondary | ICD-10-CM

## 2019-12-06 NOTE — Telephone Encounter (Signed)
Addressed in another encounter

## 2019-12-06 NOTE — Telephone Encounter (Signed)
-----   Message from Rockne Menghini, [place] sent at 12/06/2019  7:47 AM EST ----- Did I ask you to call her yet?  (I can't remember!) I need her to increase the atorvastatin from 20 mg to 40 mg daily and repeat liver function and lipid in 4-6 weeks.    Thank you!

## 2019-12-06 NOTE — Telephone Encounter (Signed)
Called and lmomed the pt to call us back and let us know if she is tolerating the statin ok and to increase it to 40mg  if all is good and to do lft, and lipid labs in 5 weeks. Advised her that we would like a callback just to confirm that she got our message ordered those labs

## 2019-12-06 NOTE — Telephone Encounter (Signed)
-----   Message from Rockne Menghini, [place] sent at 12/06/2019  7:47 AM EST ----- Did I ask you to call her yet?  (I can't remember!) I need her to increase the atorvastatin from 20 mg to 40 mg daily and repeat liver function and lipid in 4-6 weeks.    Thank you!

## 2019-12-06 NOTE — Assessment & Plan Note (Signed)
Patient with elevated LDL level and history of fatty liver disease.  Will repeat labs today to check liver function and see if still elevated.  If not, will have her increase the atorvastatin to 40 mg daily and have her repeat liver function labs in 4-6 weeks.  If she is close to goal at that time, we may consider adding ezetimibe 10 mg daily to reach a final goal.

## 2019-12-11 ENCOUNTER — Encounter: Payer: Self-pay | Admitting: Cardiovascular Disease

## 2019-12-11 ENCOUNTER — Telehealth (INDEPENDENT_AMBULATORY_CARE_PROVIDER_SITE_OTHER): Payer: Commercial Managed Care - PPO | Admitting: Cardiovascular Disease

## 2019-12-11 VITALS — Ht 63.0 in | Wt 147.0 lb

## 2019-12-11 DIAGNOSIS — E7800 Pure hypercholesterolemia, unspecified: Secondary | ICD-10-CM

## 2019-12-11 DIAGNOSIS — Z720 Tobacco use: Secondary | ICD-10-CM

## 2019-12-11 DIAGNOSIS — Z5181 Encounter for therapeutic drug level monitoring: Secondary | ICD-10-CM | POA: Diagnosis not present

## 2019-12-11 DIAGNOSIS — I2510 Atherosclerotic heart disease of native coronary artery without angina pectoris: Secondary | ICD-10-CM | POA: Diagnosis not present

## 2019-12-11 DIAGNOSIS — I10 Essential (primary) hypertension: Secondary | ICD-10-CM | POA: Diagnosis not present

## 2019-12-11 DIAGNOSIS — I701 Atherosclerosis of renal artery: Secondary | ICD-10-CM

## 2019-12-11 MED ORDER — VALSARTAN-HYDROCHLOROTHIAZIDE 160-12.5 MG PO TABS
1.0000 | ORAL_TABLET | Freq: Every day | ORAL | 1 refills | Status: DC
Start: 2019-12-11 — End: 2020-04-12

## 2019-12-11 MED ORDER — ATORVASTATIN CALCIUM 80 MG PO TABS
40.0000 mg | ORAL_TABLET | Freq: Every day | ORAL | 1 refills | Status: DC
Start: 2019-12-11 — End: 2020-12-22

## 2019-12-11 NOTE — Progress Notes (Signed)
Virtual Visit via Telephone Note   This visit type was conducted due to national recommendations for restrictions regarding the COVID-19 Pandemic (e.g. social distancing) in an effort to limit this patient's exposure and mitigate transmission in our community.  Due to her co-morbid illnesses, this patient is at least at moderate risk for complications without adequate follow up.  This format is felt to be most appropriate for this patient at this time.  The patient did not have access to video technology/had technical difficulties with video requiring transitioning to audio format only (telephone).  All issues noted in this document were discussed and addressed.  No physical exam could be performed with this format.  Please refer to the patient's chart for her  consent to telehealth for [facility].   The patient was identified using 2 identifiers.  Date:  12/11/2019   ID:  Stacy [name], DOB 08/23/1965, MRN [id number]  Patient Location: Home Provider Location: Office/Clinic  PCP:  Alycia Rossetti, MD  Cardiologist:  Skeet Latch, MD  Electrophysiologist:  None   Evaluation Performed:  Follow-Up Visit  Chief Complaint:  Hypertension, hyperlipidemia   The patient does not have symptoms concerning for COVID-19 infection (fever, chills, cough, or new shortness of breath).   History of Present Illness:    Stacy [name] is a 54 y.o. female with mild CAD, hypertension, diabetes,fatty liver disease, elevated LFTs,and tobacco abusewho presents for follow up.  Stacy [name] saw Dr. Ihor [name] was referred for a CT of the chest, abdomen, and pelvis for unintentional weight lossand tobacco abuse. On that CT she was noted to have mild to moderate calcified plaque in the thoracic aorta and left anterior descending coronary artery calcification. Therefore she was referred to cardiology for further evaluation. Stacy [name] feeling well but not physically active. She had no  exertional symptoms so prevention was stressed.  Her husband died unexpectedly of a heart attack a few years ago. He was the main cook.  She reported exertional dyspnea and was referred for coronary CT-A 03/29/2019 that revealed mild plaque in the LAD and minimal plaque in the left circumflex.  Atorvastatin was increased due to these findings.  Ms. Cloer blood pressure was elevated.  Her PCP had recommended switching from HCTZ to amlodipine due to hypokalemia.  She did make this change and has found that her blood pressure continued to be elevated.  She also had swelling in her ankles.  She was switched to valsartan/HCTZ and amlodipine was reduced to 5 mg.  This has helped with the swelling.  At her last appointment her blood pressure remained above goal.  Valsartan was increased to 320 mg and hydrochlorothiazide 25 mg.  She had renal artery Dopplers that revealed mild to moderate blockage on the left and no obstruction on the right.  Labs were negative for hyperaldosteronism.  At her endocrinology appointment her spironolactone was increased to 50 mg.    Stacy [name] followed up with our pharmacist for her lipids. Her LFTs were elevated on atorvastatin. The dose was reduced to 20 mg from 40 mg. However she had only actually been taking 20 mg. LFTs normalized on repeat. Based on this atorvastatin was increased to 40mg .  She also expressed interest in smoking cessation.  She thinks that she will need to get through the holidays.  Her BP has been in the 140s-160s.  On looking at her medications she realizes that she has not been taking valsartan/HCTZ.  Her father and sister have been ill and  she has been stressed.   She has not been getting much exercise but denies any exertional chest pain or shortness of breath.  She has been afraid of taking Wellbutrin in the past because her mom had a seizure when she tried to take it.  It is also not compatible with Prozac.  She has a prescription for Chantix but never  started taking it.   Prior CV studies:   The following studies were reviewed today:  Renal artery Dopplers 06/2019: Renal:    Right: Normal size right kidney. Normal right Resistive Index.     Normal cortical thickness of right kidney. No evidence of     right renal artery stenosis. RRV flow present.  Left: Normal size of left kidney. Normal left Resistive Index.     Normal cortical thickness of the left kidney. 1-59% stenosis     of the left renal artery. LRV flow present. Possible high end     range of 1-59% stenosed.   EKG:  08/02/19: Sinus rhythm.  Rate 95 bpm.  Past Medical History:  Diagnosis Date  . Acute bronchitis 08/02/2019  . Adrenal adenoma    as per MRI 03/09/14  . Allergy   . Anxiety   . Coronary artery calcification seen on CAT scan 04/12/2018  . Depression   . Diabetes mellitus   . Diverticulosis   . Elevated LFTs    elevated since 09/2013  . Fatty liver    moderate- as per 03/06/17 MRI liver  . GERD (gastroesophageal reflux disease)   . Gout   . H/O hiatal hernia   . Headache(784.0)   . History of depression   . Hyperlipemia   . Hyperplastic colon polyp   . Hypertension   . Positive hepatitis C antibody test    RNA NEGATIVE  . Renal artery stenosis ([place]) 08/02/2019   06/2019: 1-59% stenosis on L   . Thyroid nodule  managed Dr. Posey Pronto 02/27/2014  . Tobacco abuse   . Tubular adenoma of colon    Past Surgical History:  Procedure Laterality Date  . ANAL FISTULECTOMY  01/19/1985  . BREAST EXCISIONAL BIOPSY Left   . BREAST LUMPECTOMY Left 1988  . PAROTIDECTOMY  03/30/2011   Procedure: PAROTIDECTOMY;  Surgeon: Stacy Gala, MD;  Location: [place];  Service: ENT;  Laterality: Right;  . WRIST GANGLION EXCISION Left 1988     Current Meds  Medication Sig  . albuterol (VENTOLIN HFA) 108 (90 Base) MCG/ACT inhaler Inhale 2 puffs into the lungs every 4 (four) hours as needed for wheezing or shortness of breath.  . allopurinol  (ZYLOPRIM) 100 MG tablet TAKE ONE TABLET BY MOUTH DAILY. START ON 04/28/2017.  Marland Kitchen amLODipine (NORVASC) 5 MG tablet TAKE 1 TABLET BY MOUTH DAILY. NEEDS TO MAKE F/U APPT FOR ADDITIONAL REFILLS.  Marland Kitchen aspirin EC 81 MG tablet Take 1 tablet (81 mg total) by mouth daily. Swallow whole.  Marland Kitchen atorvastatin (LIPITOR) 80 MG tablet Take 0.5 tablets (40 mg total) by mouth daily.  . colchicine 0.6 MG tablet Take 0.6 mg by mouth as needed.  . Empagliflozin-metFORMIN HCl ER (SYNJARDY XR) 05-998 MG TB24 Take 2 tablets by mouth daily.  Marland Kitchen EPINEPHrine 0.3 mg/0.3 mL IJ SOAJ injection Insert into muscle once with any facial or neck swelling  Instruct in use  . FLUoxetine (PROZAC) 40 MG capsule TAKE 1 CAPSULE BY MOUTH DAILY.  Marland Kitchen glimepiride (AMARYL) 1 MG tablet TAKE 1 TABLET BY MOUTH DAILY BEFORE SUPPER.  Marland Kitchen glucose blood  test strip Use as instructed to monitor FSBS 3x daily. Dx: E11.65  . Lancets MISC Use as instructed to monitor FSBS 3x daily. Dx: E11.65  . metFORMIN (GLUCOPHAGE) 850 MG tablet Take 1 tablet (850 mg total) by mouth 2 (two) times daily with a meal.  . mirtazapine (REMERON) 15 MG tablet TAKE 1/2 TABLET BY MOUTH AT BEDTIME  . Multiple Vitamins-Minerals (ONE-A-DAY WOMENS VITACRAVES) CHEW Chew 1 each by mouth daily.  . pantoprazole (PROTONIX) 40 MG tablet TAKE 1 TABLET BY MOUTH EVERY DAY  . pioglitazone (ACTOS) 15 MG tablet 1 tab qd, need to follow up in office  . spironolactone (ALDACTONE) 50 MG tablet TAKE 1 TABLET BY MOUTH EVERY DAY  . SUMAtriptan (IMITREX) 100 MG tablet Take 1 tablet (100 mg total) by mouth every 2 (two) hours as needed for migraine. May repeat in 2 hours if headache persists or recurs.  . valsartan-hydrochlorothiazide (DIOVAN-HCT) 160-12.5 MG tablet Take 1 tablet by mouth daily.  Marland Kitchen zolpidem (AMBIEN) 10 MG tablet TAKE 1 TABLET BY MOUTH AT BEDTIME AS NEEDED FOR SLEEP.  . [DISCONTINUED] atorvastatin (LIPITOR) 20 MG tablet TAKE 1 TABLET BY MOUTH EVERY DAY  . [DISCONTINUED] colchicine 0.6 MG  tablet Take 1 tablet (0.6 mg total) by mouth daily. (Patient taking differently: Take 0.6 mg by mouth as needed. )  . [DISCONTINUED] valsartan-hydrochlorothiazide (DIOVAN-HCT) 160-12.5 MG tablet TAKE 1 TABLET BY MOUTH EVERY DAY     Allergies:   Almond meal, Other, and Aspirin   Social History   Tobacco Use  . Smoking status: Current Every Day Smoker    Packs/day: 1.00    Years: 28.00    Pack years: 28.00    Types: Cigarettes  . Smokeless tobacco: Never Used  Vaping Use  . Vaping Use: Never used  Substance Use Topics  . Alcohol use: Yes    Alcohol/week: 14.0 standard drinks    Types: 14 Standard drinks or equivalent per week  . Drug use: No     Family Hx: The patient's family history includes Aneurysm in her sister; Blindness in her paternal grandmother; Cancer (age of onset: 43) in her sister; Coronary artery disease (age of onset: 30) in her father; Diabetes in her sister; Heart attack in her maternal grandmother; Hypertension in her mother; Stroke in her mother. There is no history of Breast cancer, Colon cancer, Esophageal cancer, Rectal cancer, or Stomach cancer.  ROS:   Please see the history of present illness.     All other systems reviewed and are negative.   Labs/Other Tests and Data Reviewed:    Recent Labs: 06/06/2019: TSH 0.61 08/02/2019: Hemoglobin 12.5; Platelets 325 12/04/2019: ALT 29; BUN 13; Creatinine, Ser 0.94; Potassium 4.0; Sodium 137   Recent Lipid Panel Lab Results  Component Value Date/Time   CHOL 208 (H) 08/02/2019 09:55 AM   TRIG 104 08/02/2019 09:55 AM   HDL 63 08/02/2019 09:55 AM   CHOLHDL 3.3 08/02/2019 09:55 AM   CHOLHDL 3 01/10/2019 09:18 AM   LDLCALC 127 (H) 08/02/2019 09:55 AM    Wt Readings from Last 3 Encounters:  12/11/19 147 lb (66.7 kg)  12/04/19 147 lb 6.4 oz (66.9 kg)  08/02/19 141 lb 3.2 oz (64 kg)     Exam:    Ht 5\' 3"  (1.6 m)   Wt 147 lb (66.7 kg)   BMI 26.04 kg/m  GENERAL: Well-appearing.  No acute  distress. HEENT: Pupils equal round.  Oral mucosa unremarkable NECK:  No jugular venous distention, no visible thyromegaly  EXT:  No edema, no cyanosis no clubbing SKIN:  No rashes no nodules NEURO:  Speech fluent.  Cranial nerves grossly intact.  Moves all 4 extremities freely PSYCH:  Cognitively intact, oriented to person place and time   ASSESSMENT & PLAN:    # Coronary and thoracic aorta calcifcation: # Pure hypercholesterolemia: # Shortness of breath: Coronary CT-A 03/2019 revealed nonobstructive disease.  However she is in the 98 percentile for calcium score.    We are treating her aggressively for primary prevention.  Continue aspirin 81mg .  Atorvastatin was increased to 40 mg.  Repeat lipids and a CMP in 3 months.  # Renal artery stenosis:  1-59% stenosis on the L.  Repeat 06/2020.  Start aspirin 81mg .   # Hypertension:  BP is poorly controlled at home.  On review of her medication she has not been taking valsartan/HCTZ.  Will refill and she will keep tracking her blood pressures.  Goal is less than 130/80.  Continue valsartan, HCTZ, amlodipine, and spironolactone.  # Tobacco abuse: Ms. Biever is not ready to quit smoking.  We will discuss at follow-up.    Time:   Today, I have spent 45 minutes with the patient with telehealth technology discussing abnormal CT findings, hypertension.     Medication Adjustments/Labs and Tests Ordered: Current medicines are reviewed at length with the patient today.  Concerns regarding medicines are outlined above.    Tests Ordered: Orders Placed This Encounter  Procedures  . Lipid panel  . Comprehensive metabolic panel   Medication Changes: Meds ordered this encounter  Medications  . valsartan-hydrochlorothiazide (DIOVAN-HCT) 160-12.5 MG tablet    Sig: Take 1 tablet by mouth daily.    Dispense:  90 tablet    Refill:  1  . atorvastatin (LIPITOR) 80 MG tablet    Sig: Take 0.5 tablets (40 mg total) by mouth daily.    Dispense:  90  tablet    Refill:  1   COVID-19 Education: The signs and symptoms of COVID-19 were discussed with the patient and how to seek care for testing (follow up with PCP or arrange E-visit).  The importance of social distancing was discussed today.  Time:   Today, I have spent 25 minutes with the patient with telehealth technology discussing the above problems.   Disposition:  Follow up with Emersen Carroll C. Oval Linsey, MD, [facility] in 3 months  Signed, Skeet Latch, MD  12/11/2019 12:13 PM    [facility] [address], [place], [place]  [address] Phone: [phone]; Fax: [phone]

## 2019-12-11 NOTE — Patient Instructions (Addendum)
Medication Instructions:  INCREASE YOUR ATORVASTATIN TO 40 MG DAILY   *If you need a refill on your cardiac medications before your next appointment, please call your pharmacy*  Lab Work: FASTING LP/CMET IN 3 MONTHS PRIOR TO YOUR FOLLOW UP   If you have labs (blood work) drawn today and your tests are completely normal, you will receive your results only by: Marland Kitchen MyChart Message (if you have MyChart) OR . A paper copy in the mail If you have any lab test that is abnormal or we need to change your treatment, we will call you to review the results.  Testing/Procedures: NONE   Follow-Up: At [facility], you and your health needs are our priority.  As part of our continuing mission to provide you with exceptional heart care, we have created designated Provider Care Teams.  These Care Teams include your primary Cardiologist (physician) and Advanced Practice Providers (APPs -  Physician Assistants and Nurse Practitioners) who all work together to provide you with the care you need, when you need it.  We recommend signing up for the patient portal called "MyChart".  Sign up information is provided on this After Visit Summary.  MyChart is used to connect with patients for Virtual Visits (Telemedicine).  Patients are able to view lab/test results, encounter notes, upcoming appointments, etc.  Non-urgent messages can be sent to your provider as well.   To learn more about what you can do with MyChart, go to [email or web address].    Your next appointment:    03/20/2020 at 1:20 IN PERSON   MONITOR AND LOG YOUR BLOOD PRESSURE, BRING READINGS TO YOUR FOLLOW UP

## 2019-12-23 ENCOUNTER — Other Ambulatory Visit: Payer: Self-pay | Admitting: Family Medicine

## 2019-12-23 ENCOUNTER — Other Ambulatory Visit: Payer: Self-pay | Admitting: Endocrinology

## 2019-12-27 ENCOUNTER — Other Ambulatory Visit: Payer: Self-pay | Admitting: Family Medicine

## 2020-01-24 ENCOUNTER — Encounter: Payer: Self-pay | Admitting: Family Medicine

## 2020-01-24 MED ORDER — PANTOPRAZOLE SODIUM 40 MG PO TBEC
40.0000 mg | DELAYED_RELEASE_TABLET | Freq: Every day | ORAL | 1 refills | Status: DC
Start: 2020-01-24 — End: 2020-01-25

## 2020-01-25 MED ORDER — PANTOPRAZOLE SODIUM 40 MG PO TBEC
40.0000 mg | DELAYED_RELEASE_TABLET | Freq: Every day | ORAL | 1 refills | Status: DC
Start: 2020-01-25 — End: 2020-12-22

## 2020-01-25 MED ORDER — MIRTAZAPINE 15 MG PO TABS
ORAL_TABLET | ORAL | 2 refills | Status: DC
Start: 2020-01-25 — End: 2021-02-04

## 2020-02-16 ENCOUNTER — Other Ambulatory Visit: Payer: Self-pay | Admitting: *Deleted

## 2020-02-16 MED ORDER — PIOGLITAZONE HCL 15 MG PO TABS: ORAL_TABLET | ORAL | 0 refills | Status: DC

## 2020-02-16 MED ORDER — GLIMEPIRIDE 1 MG PO TABS
ORAL_TABLET | ORAL | 0 refills | Status: DC
Start: 2020-02-16 — End: 2021-02-04

## 2020-02-16 MED ORDER — SPIRONOLACTONE 50 MG PO TABS
50.0000 mg | ORAL_TABLET | Freq: Every day | ORAL | 0 refills | Status: DC
Start: 2020-02-16 — End: 2020-12-22

## 2020-02-27 ENCOUNTER — Encounter: Payer: Self-pay | Admitting: Family Medicine

## 2020-03-12 ENCOUNTER — Other Ambulatory Visit: Payer: Self-pay | Admitting: Endocrinology

## 2020-03-20 ENCOUNTER — Telehealth (INDEPENDENT_AMBULATORY_CARE_PROVIDER_SITE_OTHER): Payer: Commercial Managed Care - PPO | Admitting: Cardiovascular Disease

## 2020-03-20 ENCOUNTER — Encounter: Payer: Self-pay | Admitting: Cardiovascular Disease

## 2020-03-20 VITALS — Ht 63.0 in | Wt 145.0 lb

## 2020-03-20 DIAGNOSIS — Z5181 Encounter for therapeutic drug level monitoring: Secondary | ICD-10-CM | POA: Diagnosis not present

## 2020-03-20 DIAGNOSIS — I10 Essential (primary) hypertension: Secondary | ICD-10-CM

## 2020-03-20 DIAGNOSIS — E7800 Pure hypercholesterolemia, unspecified: Secondary | ICD-10-CM

## 2020-03-20 NOTE — Progress Notes (Signed)
Virtual Visit via Telephone Note   This visit type was conducted due to national recommendations for restrictions regarding the COVID-19 Pandemic (e.g. social distancing) in an effort to limit this patient's exposure and mitigate transmission in our community.  Due to her co-morbid illnesses, this patient is at least at moderate risk for complications without adequate follow up.  This format is felt to be most appropriate for this patient at this time.  The patient did not have access to video technology/had technical difficulties with video requiring transitioning to audio format only (telephone).  All issues noted in this document were discussed and addressed.  No physical exam could be performed with this format.  Please refer to the patient's chart for her  consent to telehealth for [facility].   The patient was identified using 2 identifiers.  Date:  04/11/2020   ID:  Stacy [name], DOB 12-22-1965, MRN [id number]  Patient Location: Home Provider Location: Office/Clinic  PCP:  Alycia Rossetti, MD  Cardiologist:  Skeet Latch, MD  Electrophysiologist:  None   Evaluation Performed:  Follow-Up Visit  Chief Complaint:  Hypertension, hyperlipidemia   The patient does not have symptoms concerning for COVID-19 infection (fever, chills, cough, or new shortness of breath).   History of Present Illness:    Stacy [name] is a 55 y.o. female with mild CAD, hypertension, diabetes,fatty liver disease, elevated LFTs,and tobacco abusewho presents for follow up.  Stacy [name] saw Dr. Ihor Gully was referred for a CT of the chest, abdomen, and pelvis for unintentional weight lossand tobacco abuse. On that CT she was noted to have mild to moderate calcified plaque in the thoracic aorta and left anterior descending coronary artery calcification. Therefore she was referred to cardiology for further evaluation. Stacy [name] feeling well but not physically active. She had no  exertional symptoms so prevention was stressed.  Her husband died unexpectedly of a heart attack a few years ago. He was the main cook.  She reported exertional dyspnea and was referred for coronary CT-A 03/29/2019 that revealed mild plaque in the LAD and minimal plaque in the left circumflex.  Atorvastatin was increased due to these findings.  Stacy [name] blood pressure was elevated.  Her PCP had recommended switching from HCTZ to amlodipine due to hypokalemia.  She did make this change and has found that her blood pressure continued to be elevated.  She also had swelling in her ankles.  She was switched to valsartan/HCTZ and amlodipine was reduced to 5 mg.  This has helped with the swelling.  At her last appointment her blood pressure remained above goal.  Valsartan was increased to 320 mg and hydrochlorothiazide 25 mg.  She had renal artery Dopplers that revealed mild to moderate blockage on the left and no obstruction on the right.  Labs were negative for hyperaldosteronism.  At her endocrinology appointment her spironolactone was increased to 50 mg.    Stacy [name] followed up with our pharmacist for her lipids. Her LFTs were elevated on atorvastatin. The dose was reduced to 20 mg from 40 mg. However she had only actually been taking 20 mg. LFTs normalized on repeat. Based on this atorvastatin was increased to 40mg .  She also expressed interest in smoking cessation.  She thinks that she will need to get through the holidays.  Her BP has been in the 140s-160s.  On looking at her medications she realizes that she has not been taking valsartan/HCTZ.  Her father and sister have been ill and  she has been stressed.   She has not been getting much exercise but denies any exertional chest pain or shortness of breath.  She has been afraid of taking Wellbutrin in the past because her mom had a seizure when she tried to take it.  It is also not compatible with Prozac.  She has a prescription for Chantix but never  started taking it.  At her last appointment Stacy [name] blood pressure was above goal.  However she had not been taking her losartan/HCTZ mistake.  Since then her BP has been in the 120s.  She tries to walk walk at work for 15 minutes twice per day.  She feels good when walking.  She has no exertional chest pain or shortness of breath with this.  She noted that she walks up 2 flights of stairs while carrying her bags she does get short of breath.  She has not had any lower extremity edema, orthopnea, or PND.  She continues to smoke a little less than 1/2 pack of cigarettes daily.  She does not think she is able to quit yet because she is still under a lot of stress.   Prior CV studies:   The following studies were reviewed today:  Renal artery Dopplers 06/2019: Renal:    Right: Normal size right kidney. Normal right Resistive Index.     Normal cortical thickness of right kidney. No evidence of     right renal artery stenosis. RRV flow present.  Left: Normal size of left kidney. Normal left Resistive Index.     Normal cortical thickness of the left kidney. 1-59% stenosis     of the left renal artery. LRV flow present. Possible high end     range of 1-59% stenosed.   EKG:  08/02/19: Sinus rhythm.  Rate 95 bpm.  Past Medical History:  Diagnosis Date  . Acute bronchitis 08/02/2019  . Adrenal adenoma    as per MRI 03/09/14  . Allergy   . Anxiety   . Coronary artery calcification seen on CAT scan 04/12/2018  . Depression   . Diabetes mellitus   . Diverticulosis   . Elevated LFTs    elevated since 09/2013  . Fatty liver    moderate- as per 03/06/17 MRI liver  . GERD (gastroesophageal reflux disease)   . Gout   . H/O hiatal hernia   . Headache(784.0)   . History of depression   . Hyperlipemia   . Hyperplastic colon polyp   . Hypertension   . Positive hepatitis C antibody test    RNA NEGATIVE  . Renal artery stenosis ([place]) 08/02/2019   06/2019: 1-59% stenosis on L    . Thyroid nodule  managed Dr. Posey Pronto 02/27/2014  . Tobacco abuse   . Tubular adenoma of colon    Past Surgical History:  Procedure Laterality Date  . ANAL FISTULECTOMY  01/19/1985  . BREAST EXCISIONAL BIOPSY Left   . BREAST LUMPECTOMY Left 1988  . PAROTIDECTOMY  03/30/2011   Procedure: PAROTIDECTOMY;  Surgeon: Izora Gala, MD;  Location: [place];  Service: ENT;  Laterality: Right;  . WRIST GANGLION EXCISION Left 1988     Current Meds  Medication Sig  . albuterol (VENTOLIN HFA) 108 (90 Base) MCG/ACT inhaler Inhale 2 puffs into the lungs every 4 (four) hours as needed for wheezing or shortness of breath.  Marland Kitchen amLODipine (NORVASC) 5 MG tablet TAKE 1 TABLET BY MOUTH DAILY. NEEDS TO MAKE F/U APPT FOR ADDITIONAL REFILLS.  Marland Kitchen  aspirin EC 81 MG tablet Take 1 tablet (81 mg total) by mouth daily. Swallow whole.  Marland Kitchen atorvastatin (LIPITOR) 80 MG tablet Take 0.5 tablets (40 mg total) by mouth daily.  . colchicine 0.6 MG tablet Take 0.6 mg by mouth as needed.  . Empagliflozin-metFORMIN HCl ER (SYNJARDY XR) 05-998 MG TB24 Take 2 tablets by mouth daily.  Marland Kitchen EPINEPHrine 0.3 mg/0.3 mL IJ SOAJ injection Insert into muscle once with any facial or neck swelling  Instruct in use  . FLUoxetine (PROZAC) 40 MG capsule TAKE 1 CAPSULE BY MOUTH DAILY.  Marland Kitchen glimepiride (AMARYL) 1 MG tablet TAKE 1 TABLET BY MOUTH DAILY BEFORE SUPPER.  Marland Kitchen glucose blood test strip Use as instructed to monitor FSBS 3x daily. Dx: E11.65  . Lancets MISC Use as instructed to monitor FSBS 3x daily. Dx: E11.65  . metFORMIN (GLUCOPHAGE) 850 MG tablet TAKE 1 TABLET (850 MG TOTAL) BY MOUTH 2 (TWO) TIMES DAILY WITH A MEAL.  . mirtazapine (REMERON) 15 MG tablet TAKE 1/2 TABLET BY MOUTH AT BEDTIME  . Multiple Vitamins-Minerals (ONE-A-DAY WOMENS VITACRAVES) CHEW Chew 1 each by mouth daily.  . pantoprazole (PROTONIX) 40 MG tablet Take 1 tablet (40 mg total) by mouth daily.  . pioglitazone (ACTOS) 15 MG tablet 1 tab qd, need to follow up in  office  . spironolactone (ALDACTONE) 50 MG tablet Take 1 tablet (50 mg total) by mouth daily.  . SUMAtriptan (IMITREX) 100 MG tablet Take 1 tablet (100 mg total) by mouth every 2 (two) hours as needed for migraine. May repeat in 2 hours if headache persists or recurs.  . valsartan-hydrochlorothiazide (DIOVAN-HCT) 160-12.5 MG tablet Take 1 tablet by mouth daily.  Marland Kitchen zolpidem (AMBIEN) 10 MG tablet TAKE 1 TABLET BY MOUTH AT BEDTIME AS NEEDED FOR SLEEP.     Allergies:   Almond meal, Other, and Aspirin   Social History   Tobacco Use  . Smoking status: Current Every Day Smoker    Packs/day: 1.00    Years: 28.00    Pack years: 28.00    Types: Cigarettes  . Smokeless tobacco: Never Used  Vaping Use  . Vaping Use: Never used  Substance Use Topics  . Alcohol use: Yes    Alcohol/week: 14.0 standard drinks    Types: 14 Standard drinks or equivalent per week  . Drug use: No     Family Hx: The patient's family history includes Aneurysm in her sister; Blindness in her paternal grandmother; Cancer (age of onset: 56) in her sister; Coronary artery disease (age of onset: 37) in her father; Diabetes in her sister; Heart attack in her maternal grandmother; Hypertension in her mother; Stroke in her mother. There is no history of Breast cancer, Colon cancer, Esophageal cancer, Rectal cancer, or Stomach cancer.  ROS:   Please see the history of present illness.     All other systems reviewed and are negative.   Labs/Other Tests and Data Reviewed:    Recent Labs: 06/06/2019: TSH 0.61 08/02/2019: Hemoglobin 12.5; Platelets 325 12/04/2019: ALT 29; BUN 13; Creatinine, Ser 0.94; Potassium 4.0; Sodium 137   Recent Lipid Panel Lab Results  Component Value Date/Time   CHOL 208 (H) 08/02/2019 09:55 AM   TRIG 104 08/02/2019 09:55 AM   HDL 63 08/02/2019 09:55 AM   CHOLHDL 3.3 08/02/2019 09:55 AM   CHOLHDL 3 01/10/2019 09:18 AM   LDLCALC 127 (H) 08/02/2019 09:55 AM    Wt Readings from Last 3  Encounters:  03/20/20 145 lb (65.8 kg)  12/11/19  147 lb (66.7 kg)  12/04/19 147 lb 6.4 oz (66.9 kg)     Exam:    Ht 5\' 3"  (1.6 m)   Wt 145 lb (65.8 kg)   BMI 25.69 kg/m  GENERAL: Well-appearing.  No acute distress. HEENT: Pupils equal round.  Oral mucosa unremarkable NECK:  No jugular venous distention, no visible thyromegaly EXT:  No edema, no cyanosis no clubbing SKIN:  No rashes no nodules NEURO:  Speech fluent.  Cranial nerves grossly intact.  Moves all 4 extremities freely PSYCH:  Cognitively intact, oriented to person place and time   ASSESSMENT & PLAN:    # Coronary and thoracic aorta calcifcation: # Pure hypercholesterolemia: # Shortness of breath: Coronary CT-A 03/2019 revealed nonobstructive disease.  However she is in the 98 percentile for calcium score.    We are treating her aggressively for primary prevention.  Continue aspirin 81mg .  Atorvastatin was increased to 40 mg.  LFTs were previously elevated on higher doses of her statin.  Have been stable but her LDL is only down to 97 on 01/2020.  She is going to keep working on diet and exercise.  Repeat fasting lipids and a CMP in 6 months.  If they remain above goal then will need to consider a PCSK9 inhibitor.  # Renal artery stenosis:  1-59% stenosis on the L.  Repeat 06/2020.  Continue aspirin 81mg  and lipid management as above.  # Hypertension:  BP is much better controlled at home.  She is unable to check it today because she is at work.  Continue exercise.  Continue amlodipine, valsartan, HCTZ, and spironolactone.  # Tobacco abuse: Ms. Labrecque is not ready to quit smoking.  She has been under a a lot of stress lately.  She understands she can let us know when she is able to work on quitting and we will be happy to assist.    Time:   Today, I have spent 22 minutes with the patient with telehealth technology discussing abnormal CT findings, hypertension.     Medication Adjustments/Labs and Tests  Ordered: Current medicines are reviewed at length with the patient today.  Concerns regarding medicines are outlined above.    Tests Ordered: Orders Placed This Encounter  Procedures  . Lipid panel  . Comprehensive metabolic panel   Medication Changes: No orders of the defined types were placed in this encounter.  COVID-19 Education: The signs and symptoms of COVID-19 were discussed with the patient and how to seek care for testing (follow up with PCP or arrange E-visit).  The importance of social distancing was discussed today.    Disposition:  Follow up with Stacy Barbier C. Oval Linsey, MD, [facility] in 6 months  Signed, Skeet Latch, MD  04/11/2020 12:12 PM    [address], [place], [place]  [address] Phone: [phone]; Fax: [phone]

## 2020-03-20 NOTE — Patient Instructions (Signed)
Medication Instructions:  Your physician recommends that you continue on your current medications as directed. Please refer to the Current Medication list given to you today.   *If you need a refill on your cardiac medications before your next appointment, please call your pharmacy*  Lab Work: FASTING LP/CMET IN 6 MONTHS PRIOR TO FOLLOW UP   If you have labs (blood work) drawn today and your tests are completely normal, you will receive your results only by: Marland Kitchen MyChart Message (if you have MyChart) OR . A paper copy in the mail If you have any lab test that is abnormal or we need to change your treatment, we will call you to review the results.  Testing/Procedures: Your physician has requested that you have a carotid duplex. This test is an ultrasound of the carotid arteries in your neck. It looks at blood flow through these arteries that supply the brain with blood. Allow one hour for this exam. There are no restrictions or special instructions. AS SCHEDULED   Follow-Up: At [facility], you and your health needs are our priority.  As part of our continuing mission to provide you with exceptional heart care, we have created designated Provider Care Teams.  These Care Teams include your primary Cardiologist (physician) and Advanced Practice Providers (APPs -  Physician Assistants and Nurse Practitioners) who all work together to provide you with the care you need, when you need it.  We recommend signing up for the patient portal called "MyChart".  Sign up information is provided on this After Visit Summary.  MyChart is used to connect with patients for Virtual Visits (Telemedicine).  Patients are able to view lab/test results, encounter notes, upcoming appointments, etc.  Non-urgent messages can be sent to your provider as well.   To learn more about what you can do with MyChart, go to [email or web address].    Your next appointment:   6 month(s)  The format for your next appointment:    In Person  Provider:   You may see Skeet Latch, MD or one of the following Advanced Practice Providers on your designated Care Team:    [place], PA-C  Coletta Memos, FNP

## 2020-04-12 ENCOUNTER — Encounter: Payer: Self-pay | Admitting: Family Medicine

## 2020-04-12 ENCOUNTER — Other Ambulatory Visit: Payer: Self-pay

## 2020-04-12 MED ORDER — FLUOXETINE HCL 40 MG PO CAPS: ORAL_CAPSULE | ORAL | 0 refills | Status: DC

## 2020-04-12 MED ORDER — ZOLPIDEM TARTRATE 10 MG PO TABS: 10.0000 mg | ORAL_TABLET | Freq: Every evening | ORAL | 0 refills | Status: DC | PRN

## 2020-04-12 MED ORDER — PIOGLITAZONE HCL 15 MG PO TABS: 15.0000 mg | ORAL_TABLET | Freq: Every day | ORAL | 0 refills | Status: DC

## 2020-04-12 MED ORDER — VALSARTAN-HYDROCHLOROTHIAZIDE 160-12.5 MG PO TABS: 1.0000 | ORAL_TABLET | Freq: Every day | ORAL | 1 refills | Status: DC

## 2020-04-12 NOTE — Telephone Encounter (Signed)
Ok to refill??  Last office visit 02/28/2019.  Last refill 09/29/2019.

## 2020-05-07 ENCOUNTER — Other Ambulatory Visit: Payer: Self-pay | Admitting: Endocrinology

## 2020-06-19 LAB — LIPID PANEL
HDL: 42 (ref 35–70)
LDL Cholesterol: 75
Triglycerides: 230 — AB (ref 40–160)

## 2020-06-19 LAB — BASIC METABOLIC PANEL
Creatinine: 1.1 (ref 0.5–1.1)
Potassium: 3.2 — AB (ref 3.4–5.3)

## 2020-06-19 LAB — HEMOGLOBIN A1C: Hemoglobin A1C: 8.1

## 2020-06-26 ENCOUNTER — Ambulatory Visit: Payer: Commercial Managed Care - PPO | Admitting: Endocrinology

## 2020-07-01 ENCOUNTER — Encounter: Payer: Self-pay | Admitting: Endocrinology

## 2020-07-08 ENCOUNTER — Encounter (HOSPITAL_COMMUNITY): Payer: Self-pay

## 2020-07-08 ENCOUNTER — Ambulatory Visit (HOSPITAL_COMMUNITY)
Admission: RE | Admit: 2020-07-08 | Payer: Commercial Managed Care - PPO | Source: Ambulatory Visit | Attending: Cardiovascular Disease | Admitting: Cardiovascular Disease

## 2020-07-11 ENCOUNTER — Other Ambulatory Visit: Payer: Self-pay | Admitting: Family Medicine

## 2020-07-15 ENCOUNTER — Ambulatory Visit (INDEPENDENT_AMBULATORY_CARE_PROVIDER_SITE_OTHER): Payer: Commercial Managed Care - PPO | Admitting: Orthopedic Surgery

## 2020-07-15 ENCOUNTER — Encounter: Payer: Self-pay | Admitting: Orthopedic Surgery

## 2020-07-15 DIAGNOSIS — S92352A Displaced fracture of fifth metatarsal bone, left foot, initial encounter for closed fracture: Secondary | ICD-10-CM

## 2020-07-15 MED ORDER — IBUPROFEN 800 MG PO TABS: 800.0000 mg | ORAL_TABLET | Freq: Three times a day (TID) | ORAL | 0 refills | Status: DC | PRN

## 2020-07-15 MED ORDER — GABAPENTIN 300 MG PO CAPS: 300.0000 mg | ORAL_CAPSULE | Freq: Three times a day (TID) | ORAL | 3 refills | Status: DC

## 2020-07-15 NOTE — Addendum Note (Signed)
Addended by: Meridee Score on: 07/15/2020 02:35 PM   Modules accepted: Orders

## 2020-07-15 NOTE — Progress Notes (Signed)
Office Visit Note   Patient: Stacy [name]           Date of Birth: Sep 16, 1965           MRN: [id number] Visit Date: 07/15/2020              Requested by: No referring provider defined for this encounter. PCP: No primary care provider on file.  Chief Complaint  Patient presents with   Left Foot - Pain      HPI: Patient is a 55 year old woman who is seen for initial evaluation for left foot fractures of the fourth and fifth metatarsal necks.  She states she slipped going down stairs sustaining the injury she was placed in a postoperative shoe and is seen today for initial evaluation.  Assessment & Plan: Visit Diagnoses: No diagnosis found.  Plan: Patient will continue with her postoperative shoe protected weightbearing she is given a note to work from home for 6 weeks.  Plan to follow-up in 3 weeks with repeat three-view radiographs of the left foot.  Follow-Up Instructions: No follow-ups on file.   Ortho Exam  Patient is alert, oriented, no adenopathy, well-dressed, normal affect, normal respiratory effort. Examination patient does have swelling but no ecchymosis no wounds no abrasions to the left foot.  She has a palpable pulse.  Review of her radiographs shows a metatarsal neck fracture of the fourth and fifth metatarsals.  Imaging: No results found. No images are attached to the encounter.  Labs: Lab Results  Component Value Date   HGBA1C 8.1 06/19/2020   HGBA1C 7.3 (H) 12/04/2019   HGBA1C 8.3 (A) 06/23/2019   LABURIC 9.9 (H) 04/14/2017   LABORGA NO GROWTH 12/23/2015     Lab Results  Component Value Date   ALBUMIN 4.4 12/04/2019   ALBUMIN 4.3 08/02/2019   ALBUMIN 4.4 01/10/2019    No results found for: MG Lab Results  Component Value Date   VD25OH 44 09/30/2013    No results found for: PREALBUMIN CBC EXTENDED Latest Ref Rng & Units 08/02/2019 10/03/2018 01/24/2018  WBC 3.4 - 10.8 x10E3/uL 7.7 10.3 -  RBC 3.77 - 5.28 x10E6/uL 4.55 4.56 -  HGB 11.1 -  15.9 g/dL 12.5 12.8 14.2  HCT 34.0 - 46.6 % 37.1 38.3 -  PLT 150 - 450 x10E3/uL 325 429(H) -  NEUTROABS 1,500 - 7,800 cells/uL - 6,046 -  LYMPHSABS 850 - 3,900 cells/uL - 3,245 -     There is no height or weight on file to calculate BMI.  Orders:  No orders of the defined types were placed in this encounter.  No orders of the defined types were placed in this encounter.    Procedures: No procedures performed  Clinical Data: No additional findings.  ROS:  All other systems negative, except as noted in the HPI. Review of Systems  Objective: Vital Signs: There were no vitals taken for this visit.  Specialty Comments:  No specialty comments available.  PMFS History: Patient Active Problem List   Diagnosis Date Noted   Renal artery stenosis ([place]) 08/02/2019   Acute bronchitis 08/02/2019   Adenoma of right adrenal gland 01/16/2019   Coronary artery calcification seen on CAT scan 04/12/2018   Insomnia 01/01/2015   Fatty liver 01/01/2015   Adrenal mass, right Dr Erskine Squibb 03/13/2014   Thyroid nodule  managed Dr. Posey Pronto 02/27/2014   EtOH dependence ([place]) 11/22/2013   Bereavement 10/04/2013   Tobacco use 10/04/2013   MDD (major depressive disorder) 08/23/2013  Colon polyp 08/23/2013   Migraines 08/23/2013   GERD (gastroesophageal reflux disease)    Depression with anxiety    Type 2 diabetes mellitus with other specified complication (HCC)    Gout    Hypertension    Hyperlipemia    Past Medical History:  Diagnosis Date   Acute bronchitis 08/02/2019   Adrenal adenoma    as per MRI 03/09/14   Allergy    Anxiety    Coronary artery calcification seen on CAT scan 04/12/2018   Depression    Diabetes mellitus    Diverticulosis    Elevated LFTs    elevated since 09/2013   Fatty liver    moderate- as per 03/06/17 MRI liver   GERD (gastroesophageal reflux disease)    Gout    H/O hiatal hernia    Headache(784.0)    History of depression    Hyperlipemia     Hyperplastic colon polyp    Hypertension    Positive hepatitis C antibody test    RNA NEGATIVE   Renal artery stenosis ([place]) 08/02/2019   06/2019: 1-59% stenosis on L    Thyroid nodule  managed Dr. Posey Pronto 02/27/2014   Tobacco abuse    Tubular adenoma of colon     Family History  Problem Relation Age of Onset   Coronary artery disease Father 49       CAD,CABG   Hypertension Mother    Stroke Mother    Diabetes Sister        uncontrolled   Cancer Sister 48       uterine cancer   Aneurysm Sister        brain   Heart attack Maternal Grandmother    Blindness Paternal Grandmother    Breast cancer Neg Hx    Colon cancer Neg Hx    Esophageal cancer Neg Hx    Rectal cancer Neg Hx    Stomach cancer Neg Hx     Past Surgical History:  Procedure Laterality Date   ANAL FISTULECTOMY  01/19/1985   BREAST EXCISIONAL BIOPSY Left    BREAST LUMPECTOMY Left 1988   PAROTIDECTOMY  03/30/2011   Procedure: PAROTIDECTOMY;  Surgeon: Izora Gala, MD;  Location: [facility];  Service: ENT;  Laterality: Right;   WRIST GANGLION EXCISION Left 1988   Social History   Occupational History    Employer: [employer]  Tobacco Use   Smoking status: Every Day    Packs/day: 1.00    Years: 28.00    Pack years: 28.00    Types: Cigarettes   Smokeless tobacco: Never  Vaping Use   Vaping Use: Never used  Substance and Sexual Activity   Alcohol use: Yes    Alcohol/week: 14.0 standard drinks    Types: 14 Standard drinks or equivalent per week   Drug use: No   Sexual activity: Not Currently    Birth control/protection: Post-menopausal

## 2020-07-16 ENCOUNTER — Ambulatory Visit: Payer: Commercial Managed Care - PPO | Admitting: Endocrinology

## 2020-07-25 ENCOUNTER — Other Ambulatory Visit (HOSPITAL_COMMUNITY): Payer: Self-pay | Admitting: Cardiovascular Disease

## 2020-07-25 DIAGNOSIS — I10 Essential (primary) hypertension: Secondary | ICD-10-CM

## 2020-08-05 ENCOUNTER — Telehealth: Payer: Self-pay | Admitting: Orthopedic Surgery

## 2020-08-05 ENCOUNTER — Other Ambulatory Visit: Payer: Self-pay

## 2020-08-05 ENCOUNTER — Ambulatory Visit (INDEPENDENT_AMBULATORY_CARE_PROVIDER_SITE_OTHER): Payer: Commercial Managed Care - PPO | Admitting: Physician Assistant

## 2020-08-05 ENCOUNTER — Encounter: Payer: Self-pay | Admitting: Orthopedic Surgery

## 2020-08-05 ENCOUNTER — Ambulatory Visit (HOSPITAL_COMMUNITY)
Admission: RE | Admit: 2020-08-05 | Payer: Commercial Managed Care - PPO | Source: Ambulatory Visit | Attending: Cardiovascular Disease | Admitting: Cardiovascular Disease

## 2020-08-05 ENCOUNTER — Ambulatory Visit: Payer: Self-pay

## 2020-08-05 DIAGNOSIS — S92352A Displaced fracture of fifth metatarsal bone, left foot, initial encounter for closed fracture: Secondary | ICD-10-CM

## 2020-08-05 NOTE — Telephone Encounter (Signed)
Received medical records release form,$25.00 check and disability paperwork from patient    Forwarding to [facility] today

## 2020-08-05 NOTE — Progress Notes (Signed)
Office Visit Note   Patient: Stacy [name]           Date of Birth: [birth year]           MRN: [id number] Visit Date: 08/05/2020              Requested by: No referring provider defined for this encounter. PCP: No primary care provider on file.  Chief Complaint  Patient presents with   Left Foot - Pain      HPI: Patient presents in follow-up for her left foot fourth and fifth metatarsal neck fractures.  She does say she is feeling better.  She is wearing a postop shoe which is inappropriately sized and way too big on her.  She does she is she trips on it.  She does say that she does not have to take pain medication nearly as much  Assessment & Plan: Visit Diagnoses:  1. Displaced fracture of fifth metatarsal bone, left foot, initial encounter for closed fracture     Plan: We will provide an appropriate shoe follow-up in 3 weeks with new x-rays  Follow-Up Instructions: No follow-ups on file.   Ortho Exam  Patient is alert, oriented, no adenopathy, well-dressed, normal affect, normal respiratory effort. Examination of her foot some mild soft tissue swelling palpable pulses.  No redness no cellulitis she still has tenderness though less so over the distal fourth and fifth metatarsal  Imaging: No results found. No images are attached to the encounter.  Labs: Lab Results  Component Value Date   HGBA1C 8.1 06/19/2020   HGBA1C 7.3 (H) 12/04/2019   HGBA1C 8.3 (A) 06/23/2019   LABURIC 9.9 (H) 04/14/2017   LABORGA NO GROWTH 12/23/2015     Lab Results  Component Value Date   ALBUMIN 4.4 12/04/2019   ALBUMIN 4.3 08/02/2019   ALBUMIN 4.4 01/10/2019    No results found for: MG Lab Results  Component Value Date   VD25OH 44 09/30/2013    No results found for: PREALBUMIN CBC EXTENDED Latest Ref Rng & Units 08/02/2019 10/03/2018 01/24/2018  WBC 3.4 - 10.8 x10E3/uL 7.7 10.3 -  RBC 3.77 - 5.28 x10E6/uL 4.55 4.56 -  HGB 11.1 - 15.9 g/dL 12.5 12.8 14.2  HCT 34.0 - 46.6 %  37.1 38.3 -  PLT 150 - 450 x10E3/uL 325 429(H) -  NEUTROABS 1,500 - 7,800 cells/uL - 6,046 -  LYMPHSABS 850 - 3,900 cells/uL - 3,245 -     There is no height or weight on file to calculate BMI.  Orders:  Orders Placed This Encounter  Procedures   XR Foot Complete Left   No orders of the defined types were placed in this encounter.    Procedures: No procedures performed  Clinical Data: No additional findings.  ROS:  All other systems negative, except as noted in the HPI. Review of Systems  Objective: Vital Signs: There were no vitals taken for this visit.  Specialty Comments:  No specialty comments available.  PMFS History: Patient Active Problem List   Diagnosis Date Noted   Renal artery stenosis ([place]) 08/02/2019   Acute bronchitis 08/02/2019   Adenoma of right adrenal gland 01/16/2019   Coronary artery calcification seen on CAT scan 04/12/2018   Insomnia 01/01/2015   Fatty liver 01/01/2015   Adrenal mass, right Dr Erskine Squibb 03/13/2014   Thyroid nodule  managed Dr. Posey Pronto 02/27/2014   EtOH dependence ([place]) 11/22/2013   Bereavement 10/04/2013   Tobacco use 10/04/2013   MDD (major  depressive disorder) 08/23/2013   Colon polyp 08/23/2013   Migraines 08/23/2013   GERD (gastroesophageal reflux disease)    Depression with anxiety    Type 2 diabetes mellitus with other specified complication (HCC)    Gout    Hypertension    Hyperlipemia    Past Medical History:  Diagnosis Date   Acute bronchitis 08/02/2019   Adrenal adenoma    as per MRI 03/09/14   Allergy    Anxiety    Coronary artery calcification seen on CAT scan 04/12/2018   Depression    Diabetes mellitus    Diverticulosis    Elevated LFTs    elevated since 09/2013   Fatty liver    moderate- as per 03/06/17 MRI liver   GERD (gastroesophageal reflux disease)    Gout    H/O hiatal hernia    Headache(784.0)    History of depression    Hyperlipemia    Hyperplastic colon polyp    Hypertension     Positive hepatitis C antibody test    RNA NEGATIVE   Renal artery stenosis ([place]) 08/02/2019   06/2019: 1-59% stenosis on L    Thyroid nodule  managed Dr. Posey Pronto 02/27/2014   Tobacco abuse    Tubular adenoma of colon     Family History  Problem Relation Age of Onset   Coronary artery disease Father 53       CAD,CABG   Hypertension Mother    Stroke Mother    Diabetes Sister        uncontrolled   Cancer Sister 67       uterine cancer   Aneurysm Sister        brain   Heart attack Maternal Grandmother    Blindness Paternal Grandmother    Breast cancer Neg Hx    Colon cancer Neg Hx    Esophageal cancer Neg Hx    Rectal cancer Neg Hx    Stomach cancer Neg Hx     Past Surgical History:  Procedure Laterality Date   ANAL FISTULECTOMY  01/19/1985   BREAST EXCISIONAL BIOPSY Left    BREAST LUMPECTOMY Left 1988   PAROTIDECTOMY  03/30/2011   Procedure: PAROTIDECTOMY;  Surgeon: Izora Gala, MD;  Location: [facility];  Service: ENT;  Laterality: Right;   WRIST GANGLION EXCISION Left 1988   Social History   Occupational History    Employer: [employer]  Tobacco Use   Smoking status: Every Day    Packs/day: 1.00    Years: 28.00    Pack years: 28.00    Types: Cigarettes   Smokeless tobacco: Never  Vaping Use   Vaping Use: Never used  Substance and Sexual Activity   Alcohol use: Yes    Alcohol/week: 14.0 standard drinks    Types: 14 Standard drinks or equivalent per week   Drug use: No   Sexual activity: Not Currently    Birth control/protection: Post-menopausal

## 2020-08-09 ENCOUNTER — Ambulatory Visit (HOSPITAL_COMMUNITY): Payer: Commercial Managed Care - PPO

## 2020-08-12 ENCOUNTER — Encounter (HOSPITAL_COMMUNITY): Payer: Commercial Managed Care - PPO

## 2020-08-13 ENCOUNTER — Other Ambulatory Visit: Payer: Self-pay

## 2020-08-13 ENCOUNTER — Telehealth: Payer: Self-pay | Admitting: Orthopedic Surgery

## 2020-08-13 NOTE — Telephone Encounter (Signed)
Letter is in chart. If it needs to be stamped let me know and I can print off for signature.

## 2020-08-13 NOTE — Telephone Encounter (Signed)
-  Patient is asking if FMLA forms have been completed and faxed to HR. Caryl Comes still waiting on answer from Dr. Sharol Given) Patient was seen on 07-15-20 and provided forms to our office 08-05-20.  She is catching heat from HR.  Please provide update.   -Patient is also requesting another note from Dr. Sharol Given stating she is to remain off of her foot and work from home until further notice.   Please email copy of the FMLA forms once completed and the note to the following address:     [email or web address]

## 2020-08-19 ENCOUNTER — Ambulatory Visit (HOSPITAL_COMMUNITY)
Admission: RE | Admit: 2020-08-19 | Payer: Commercial Managed Care - PPO | Source: Ambulatory Visit | Attending: Cardiovascular Disease | Admitting: Cardiovascular Disease

## 2020-08-19 ENCOUNTER — Encounter (HOSPITAL_COMMUNITY): Payer: Self-pay

## 2020-08-20 ENCOUNTER — Encounter (HOSPITAL_COMMUNITY): Payer: Self-pay

## 2020-08-29 ENCOUNTER — Telehealth: Payer: Self-pay | Admitting: Orthopedic Surgery

## 2020-08-29 ENCOUNTER — Ambulatory Visit (INDEPENDENT_AMBULATORY_CARE_PROVIDER_SITE_OTHER): Payer: Commercial Managed Care - PPO | Admitting: Orthopedic Surgery

## 2020-08-29 ENCOUNTER — Ambulatory Visit: Payer: Self-pay

## 2020-08-29 DIAGNOSIS — S92352A Displaced fracture of fifth metatarsal bone, left foot, initial encounter for closed fracture: Secondary | ICD-10-CM | POA: Diagnosis not present

## 2020-08-29 NOTE — Telephone Encounter (Signed)
Received $25.00 check,medical records release form and disability forms from patient/forwarding to [facility] today

## 2020-08-30 ENCOUNTER — Other Ambulatory Visit: Payer: Self-pay

## 2020-09-16 ENCOUNTER — Encounter: Payer: Self-pay | Admitting: Orthopedic Surgery

## 2020-09-16 NOTE — Progress Notes (Signed)
Office Visit Note   Patient: Stacy [name]           Date of Birth: 1965/10/04           MRN: [id number] Visit Date: 08/29/2020              Requested by: No referring provider defined for this encounter. PCP: No primary care provider on file.  Chief Complaint  Patient presents with   Left Foot - Follow-up    4th and 5th MT neck fx       HPI: Patient is a 55 year old woman who presents in follow-up for metatarsal shaft fractures left foot fourth and fifth metatarsals.  She is currently weightbearing in a postoperative shoe.  Assessment & Plan: Visit Diagnoses:  1. Displaced fracture of fifth metatarsal bone, left foot, initial encounter for closed fracture     Plan: Patient is still tender to palpation without evidence of callus formation we will have her continue with the postoperative shoe for 4 weeks and repeat three-view radiographs of the left foot at follow-up  Follow-Up Instructions: Return in about 4 weeks (around 09/26/2020).   Ortho Exam  Patient is alert, oriented, no adenopathy, well-dressed, normal affect, normal respiratory effort. Examination patient is 6 weeks out from her metatarsal fractures left foot she is still tender to palpitation.  Her foot is plantigrade there are no ulcers no skin breakdown no cellulitis.  Imaging: No results found. No images are attached to the encounter.  Labs: Lab Results  Component Value Date   HGBA1C 8.1 06/19/2020   HGBA1C 7.3 (H) 12/04/2019   HGBA1C 8.3 (A) 06/23/2019   LABURIC 9.9 (H) 04/14/2017   LABORGA NO GROWTH 12/23/2015     Lab Results  Component Value Date   ALBUMIN 4.4 12/04/2019   ALBUMIN 4.3 08/02/2019   ALBUMIN 4.4 01/10/2019    No results found for: MG Lab Results  Component Value Date   VD25OH 44 09/30/2013    No results found for: PREALBUMIN CBC EXTENDED Latest Ref Rng & Units 08/02/2019 10/03/2018 01/24/2018  WBC 3.4 - 10.8 x10E3/uL 7.7 10.3 -  RBC 3.77 - 5.28 x10E6/uL 4.55 4.56 -  HGB  11.1 - 15.9 g/dL 12.5 12.8 14.2  HCT 34.0 - 46.6 % 37.1 38.3 -  PLT 150 - 450 x10E3/uL 325 429(H) -  NEUTROABS 1,500 - 7,800 cells/uL - 6,046 -  LYMPHSABS 850 - 3,900 cells/uL - 3,245 -     There is no height or weight on file to calculate BMI.  Orders:  Orders Placed This Encounter  Procedures   XR Foot Complete Left   No orders of the defined types were placed in this encounter.    Procedures: No procedures performed  Clinical Data: No additional findings.  ROS:  All other systems negative, except as noted in the HPI. Review of Systems  Objective: Vital Signs: There were no vitals taken for this visit.  Specialty Comments:  No specialty comments available.  PMFS History: Patient Active Problem List   Diagnosis Date Noted   Renal artery stenosis ([place]) 08/02/2019   Acute bronchitis 08/02/2019   Adenoma of right adrenal gland 01/16/2019   Coronary artery calcification seen on CAT scan 04/12/2018   Insomnia 01/01/2015   Fatty liver 01/01/2015   Adrenal mass, right Dr Erskine Squibb 03/13/2014   Thyroid nodule  managed Dr. Posey Pronto 02/27/2014   EtOH dependence ([place]) 11/22/2013   Bereavement 10/04/2013   Tobacco use 10/04/2013   MDD (major depressive disorder)  08/23/2013   Colon polyp 08/23/2013   Migraines 08/23/2013   GERD (gastroesophageal reflux disease)    Depression with anxiety    Type 2 diabetes mellitus with other specified complication (HCC)    Gout    Hypertension    Hyperlipemia    Past Medical History:  Diagnosis Date   Acute bronchitis 08/02/2019   Adrenal adenoma    as per MRI 03/09/14   Allergy    Anxiety    Coronary artery calcification seen on CAT scan 04/12/2018   Depression    Diabetes mellitus    Diverticulosis    Elevated LFTs    elevated since 09/2013   Fatty liver    moderate- as per 03/06/17 MRI liver   GERD (gastroesophageal reflux disease)    Gout    H/O hiatal hernia    Headache(784.0)    History of depression     Hyperlipemia    Hyperplastic colon polyp    Hypertension    Positive hepatitis C antibody test    RNA NEGATIVE   Renal artery stenosis ([place]) 08/02/2019   06/2019: 1-59% stenosis on L    Thyroid nodule  managed Dr. Posey Pronto 02/27/2014   Tobacco abuse    Tubular adenoma of colon     Family History  Problem Relation Age of Onset   Coronary artery disease Father 58       CAD,CABG   Hypertension Mother    Stroke Mother    Diabetes Sister        uncontrolled   Cancer Sister 33       uterine cancer   Aneurysm Sister        brain   Heart attack Maternal Grandmother    Blindness Paternal Grandmother    Breast cancer Neg Hx    Colon cancer Neg Hx    Esophageal cancer Neg Hx    Rectal cancer Neg Hx    Stomach cancer Neg Hx     Past Surgical History:  Procedure Laterality Date   ANAL FISTULECTOMY  01/19/1985   BREAST EXCISIONAL BIOPSY Left    BREAST LUMPECTOMY Left 1988   PAROTIDECTOMY  03/30/2011   Procedure: PAROTIDECTOMY;  Surgeon: Izora Gala, MD;  Location: [facility];  Service: ENT;  Laterality: Right;   WRIST GANGLION EXCISION Left 1988   Social History   Occupational History    Employer: [employer]  Tobacco Use   Smoking status: Every Day    Packs/day: 1.00    Years: 28.00    Pack years: 28.00    Types: Cigarettes   Smokeless tobacco: Never  Vaping Use   Vaping Use: Never used  Substance and Sexual Activity   Alcohol use: Yes    Alcohol/week: 14.0 standard drinks    Types: 14 Standard drinks or equivalent per week   Drug use: No   Sexual activity: Not Currently    Birth control/protection: Post-menopausal

## 2020-09-25 ENCOUNTER — Ambulatory Visit: Payer: Commercial Managed Care - PPO | Admitting: Physician Assistant

## 2020-09-26 ENCOUNTER — Ambulatory Visit (INDEPENDENT_AMBULATORY_CARE_PROVIDER_SITE_OTHER): Payer: Commercial Managed Care - PPO | Admitting: Physician Assistant

## 2020-09-26 ENCOUNTER — Ambulatory Visit: Payer: Self-pay

## 2020-09-26 ENCOUNTER — Encounter: Payer: Self-pay | Admitting: Physician Assistant

## 2020-09-26 ENCOUNTER — Encounter (HOSPITAL_BASED_OUTPATIENT_CLINIC_OR_DEPARTMENT_OTHER): Payer: Self-pay

## 2020-09-26 DIAGNOSIS — S92352A Displaced fracture of fifth metatarsal bone, left foot, initial encounter for closed fracture: Secondary | ICD-10-CM | POA: Diagnosis not present

## 2020-09-26 NOTE — Progress Notes (Signed)
Office Visit Note   Patient: Stacy [name]           Date of Birth: [birth year]           MRN: [id number] Visit Date: 09/26/2020              Requested by: No referring provider defined for this encounter. PCP: No primary care provider on file.  Chief Complaint  Patient presents with   Left Foot - Follow-up    Left foot 4th and 5th MT neck fx       HPI: Patient is a pleasant 55 year old woman who we have been following for over 2 months for fourth and fifth metatarsal neck fractures.  She has been wearing a postoperative shoe.  She says it feels about the same as it did at her last visit  Assessment & Plan: Visit Diagnoses:  1. Displaced fracture of fifth metatarsal bone, left foot, initial encounter for closed fracture     Plan: Continue with postop shoe question whether a CT scan would give [place] more information.  Certainly she has some delayed healing.  That being said it is fairly obvious on the x-rays that there has not been much progressive healing.  We will discuss with Dr. Sharol Given and call her on Monday  Follow-Up Instructions: No follow-ups on file.   Ortho Exam  Patient is alert, oriented, no adenopathy, well-dressed, normal affect, normal respiratory effort. Examination she has palpable dorsalis pedis pulse.  Some mild soft tissue swelling.  She is still very tender to palpation over the fifth and fourth metatarsals.  No lateral pain or other signs of fractures  Imaging: XR Foot Complete Left  Result Date: 09/26/2020 X-rays of her right foot demonstrate metatarsal neck fractures.  Still not robust callus.  Not much interval change since previous x-rays  No images are attached to the encounter.  Labs: Lab Results  Component Value Date   HGBA1C 8.1 06/19/2020   HGBA1C 7.3 (H) 12/04/2019   HGBA1C 8.3 (A) 06/23/2019   LABURIC 9.9 (H) 04/14/2017   LABORGA NO GROWTH 12/23/2015     Lab Results  Component Value Date   ALBUMIN 4.4 12/04/2019   ALBUMIN 4.3  08/02/2019   ALBUMIN 4.4 01/10/2019    No results found for: MG Lab Results  Component Value Date   VD25OH 44 09/30/2013    No results found for: PREALBUMIN CBC EXTENDED Latest Ref Rng & Units 08/02/2019 10/03/2018 01/24/2018  WBC 3.4 - 10.8 x10E3/uL 7.7 10.3 -  RBC 3.77 - 5.28 x10E6/uL 4.55 4.56 -  HGB 11.1 - 15.9 g/dL 12.5 12.8 14.2  HCT 34.0 - 46.6 % 37.1 38.3 -  PLT 150 - 450 x10E3/uL 325 429(H) -  NEUTROABS 1,500 - 7,800 cells/uL - 6,046 -  LYMPHSABS 850 - 3,900 cells/uL - 3,245 -     There is no height or weight on file to calculate BMI.  Orders:  Orders Placed This Encounter  Procedures   XR Foot Complete Left   No orders of the defined types were placed in this encounter.    Procedures: No procedures performed  Clinical Data: No additional findings.  ROS:  All other systems negative, except as noted in the HPI. Review of Systems  Objective: Vital Signs: There were no vitals taken for this visit.  Specialty Comments:  No specialty comments available.  PMFS History: Patient Active Problem List   Diagnosis Date Noted   Renal artery stenosis ([place]) 08/02/2019   Acute bronchitis  08/02/2019   Adenoma of right adrenal gland 01/16/2019   Coronary artery calcification seen on CAT scan 04/12/2018   Insomnia 01/01/2015   Fatty liver 01/01/2015   Adrenal mass, right Dr Erskine Squibb 03/13/2014   Thyroid nodule  managed Dr. Posey Pronto 02/27/2014   EtOH dependence ([place]) 11/22/2013   Bereavement 10/04/2013   Tobacco use 10/04/2013   MDD (major depressive disorder) 08/23/2013   Colon polyp 08/23/2013   Migraines 08/23/2013   GERD (gastroesophageal reflux disease)    Depression with anxiety    Type 2 diabetes mellitus with other specified complication ([place])    Gout    Hypertension    Hyperlipemia    Past Medical History:  Diagnosis Date   Acute bronchitis 08/02/2019   Adrenal adenoma    as per MRI 03/09/14   Allergy    Anxiety    Coronary artery  calcification seen on CAT scan 04/12/2018   Depression    Diabetes mellitus    Diverticulosis    Elevated LFTs    elevated since 09/2013   Fatty liver    moderate- as per 03/06/17 MRI liver   GERD (gastroesophageal reflux disease)    Gout    H/O hiatal hernia    Headache(784.0)    History of depression    Hyperlipemia    Hyperplastic colon polyp    Hypertension    Positive hepatitis C antibody test    RNA NEGATIVE   Renal artery stenosis ([place]) 08/02/2019   06/2019: 1-59% stenosis on L    Thyroid nodule  managed Dr. Posey Pronto 02/27/2014   Tobacco abuse    Tubular adenoma of colon     Family History  Problem Relation Age of Onset   Coronary artery disease Father 86       CAD,CABG   Hypertension Mother    Stroke Mother    Diabetes Sister        uncontrolled   Cancer Sister 25       uterine cancer   Aneurysm Sister        brain   Heart attack Maternal Grandmother    Blindness Paternal Grandmother    Breast cancer Neg Hx    Colon cancer Neg Hx    Esophageal cancer Neg Hx    Rectal cancer Neg Hx    Stomach cancer Neg Hx     Past Surgical History:  Procedure Laterality Date   ANAL FISTULECTOMY  01/19/1985   BREAST EXCISIONAL BIOPSY Left    BREAST LUMPECTOMY Left 1988   PAROTIDECTOMY  03/30/2011   Procedure: PAROTIDECTOMY;  Surgeon: Izora Gala, MD;  Location: [facility];  Service: ENT;  Laterality: Right;   WRIST GANGLION EXCISION Left 1988   Social History   Occupational History    Employer: [employer]  Tobacco Use   Smoking status: Every Day    Packs/day: 1.00    Years: 28.00    Pack years: 28.00    Types: Cigarettes   Smokeless tobacco: Never  Vaping Use   Vaping Use: Never used  Substance and Sexual Activity   Alcohol use: Yes    Alcohol/week: 14.0 standard drinks    Types: 14 Standard drinks or equivalent per week   Drug use: No   Sexual activity: Not Currently    Birth control/protection: Post-menopausal

## 2020-12-02 ENCOUNTER — Other Ambulatory Visit: Payer: Self-pay

## 2020-12-02 ENCOUNTER — Ambulatory Visit (INDEPENDENT_AMBULATORY_CARE_PROVIDER_SITE_OTHER): Payer: Commercial Managed Care - PPO | Admitting: Orthopedic Surgery

## 2020-12-02 ENCOUNTER — Ambulatory Visit: Payer: Self-pay

## 2020-12-02 DIAGNOSIS — S92352A Displaced fracture of fifth metatarsal bone, left foot, initial encounter for closed fracture: Secondary | ICD-10-CM

## 2020-12-19 ENCOUNTER — Inpatient Hospital Stay (HOSPITAL_COMMUNITY)
Admission: EM | Admit: 2020-12-19 | Discharge: 2020-12-22 | DRG: 683 | Disposition: A | Discharge status: Home / Self Care | Payer: Commercial Managed Care - PPO | Attending: Internal Medicine | Admitting: Internal Medicine

## 2020-12-19 ENCOUNTER — Emergency Department (HOSPITAL_COMMUNITY): Payer: Commercial Managed Care - PPO

## 2020-12-19 ENCOUNTER — Observation Stay (HOSPITAL_BASED_OUTPATIENT_CLINIC_OR_DEPARTMENT_OTHER): Payer: Commercial Managed Care - PPO

## 2020-12-19 ENCOUNTER — Other Ambulatory Visit: Payer: Self-pay

## 2020-12-19 ENCOUNTER — Encounter (HOSPITAL_COMMUNITY): Payer: Self-pay | Admitting: Emergency Medicine

## 2020-12-19 DIAGNOSIS — E861 Hypovolemia: Secondary | ICD-10-CM | POA: Diagnosis present

## 2020-12-19 DIAGNOSIS — N19 Unspecified kidney failure: Secondary | ICD-10-CM

## 2020-12-19 DIAGNOSIS — D72829 Elevated white blood cell count, unspecified: Secondary | ICD-10-CM | POA: Diagnosis present

## 2020-12-19 DIAGNOSIS — N179 Acute kidney failure, unspecified: Secondary | ICD-10-CM | POA: Diagnosis not present

## 2020-12-19 DIAGNOSIS — Z79899 Other long term (current) drug therapy: Secondary | ICD-10-CM

## 2020-12-19 DIAGNOSIS — E8729 Other acidosis: Secondary | ICD-10-CM | POA: Diagnosis present

## 2020-12-19 DIAGNOSIS — Z886 Allergy status to analgesic agent status: Secondary | ICD-10-CM

## 2020-12-19 DIAGNOSIS — M25561 Pain in right knee: Secondary | ICD-10-CM | POA: Diagnosis present

## 2020-12-19 DIAGNOSIS — R000 Tachycardia, unspecified: Secondary | ICD-10-CM | POA: Diagnosis present

## 2020-12-19 DIAGNOSIS — E875 Hyperkalemia: Secondary | ICD-10-CM | POA: Diagnosis present

## 2020-12-19 DIAGNOSIS — F17210 Nicotine dependence, cigarettes, uncomplicated: Secondary | ICD-10-CM | POA: Diagnosis present

## 2020-12-19 DIAGNOSIS — K760 Fatty (change of) liver, not elsewhere classified: Secondary | ICD-10-CM | POA: Diagnosis present

## 2020-12-19 DIAGNOSIS — Z780 Asymptomatic menopausal state: Secondary | ICD-10-CM

## 2020-12-19 DIAGNOSIS — R8281 Pyuria: Secondary | ICD-10-CM | POA: Diagnosis present

## 2020-12-19 DIAGNOSIS — F419 Anxiety disorder, unspecified: Secondary | ICD-10-CM | POA: Diagnosis present

## 2020-12-19 DIAGNOSIS — Z9109 Other allergy status, other than to drugs and biological substances: Secondary | ICD-10-CM

## 2020-12-19 DIAGNOSIS — D649 Anemia, unspecified: Secondary | ICD-10-CM | POA: Diagnosis present

## 2020-12-19 DIAGNOSIS — Z7984 Long term (current) use of oral hypoglycemic drugs: Secondary | ICD-10-CM

## 2020-12-19 DIAGNOSIS — I959 Hypotension, unspecified: Secondary | ICD-10-CM | POA: Diagnosis present

## 2020-12-19 DIAGNOSIS — I701 Atherosclerosis of renal artery: Secondary | ICD-10-CM | POA: Diagnosis present

## 2020-12-19 DIAGNOSIS — M109 Gout, unspecified: Secondary | ICD-10-CM | POA: Diagnosis present

## 2020-12-19 DIAGNOSIS — D696 Thrombocytopenia, unspecified: Secondary | ICD-10-CM | POA: Diagnosis present

## 2020-12-19 DIAGNOSIS — E871 Hypo-osmolality and hyponatremia: Secondary | ICD-10-CM | POA: Diagnosis present

## 2020-12-19 DIAGNOSIS — I2510 Atherosclerotic heart disease of native coronary artery without angina pectoris: Secondary | ICD-10-CM | POA: Diagnosis present

## 2020-12-19 DIAGNOSIS — E785 Hyperlipidemia, unspecified: Secondary | ICD-10-CM | POA: Diagnosis present

## 2020-12-19 DIAGNOSIS — Z823 Family history of stroke: Secondary | ICD-10-CM

## 2020-12-19 DIAGNOSIS — M659 Synovitis and tenosynovitis, unspecified: Secondary | ICD-10-CM

## 2020-12-19 DIAGNOSIS — Z720 Tobacco use: Secondary | ICD-10-CM | POA: Diagnosis present

## 2020-12-19 DIAGNOSIS — E1169 Type 2 diabetes mellitus with other specified complication: Secondary | ICD-10-CM

## 2020-12-19 DIAGNOSIS — Z821 Family history of blindness and visual loss: Secondary | ICD-10-CM

## 2020-12-19 DIAGNOSIS — Z833 Family history of diabetes mellitus: Secondary | ICD-10-CM

## 2020-12-19 DIAGNOSIS — E119 Type 2 diabetes mellitus without complications: Secondary | ICD-10-CM | POA: Diagnosis present

## 2020-12-19 DIAGNOSIS — R1011 Right upper quadrant pain: Secondary | ICD-10-CM | POA: Diagnosis present

## 2020-12-19 DIAGNOSIS — K449 Diaphragmatic hernia without obstruction or gangrene: Secondary | ICD-10-CM | POA: Diagnosis present

## 2020-12-19 DIAGNOSIS — E860 Dehydration: Secondary | ICD-10-CM | POA: Diagnosis present

## 2020-12-19 DIAGNOSIS — R1013 Epigastric pain: Secondary | ICD-10-CM

## 2020-12-19 DIAGNOSIS — K219 Gastro-esophageal reflux disease without esophagitis: Secondary | ICD-10-CM | POA: Diagnosis present

## 2020-12-19 DIAGNOSIS — Z20822 Contact with and (suspected) exposure to covid-19: Secondary | ICD-10-CM | POA: Diagnosis present

## 2020-12-19 DIAGNOSIS — G4700 Insomnia, unspecified: Secondary | ICD-10-CM | POA: Diagnosis present

## 2020-12-19 DIAGNOSIS — I10 Essential (primary) hypertension: Secondary | ICD-10-CM | POA: Diagnosis present

## 2020-12-19 DIAGNOSIS — F32A Depression, unspecified: Secondary | ICD-10-CM | POA: Diagnosis present

## 2020-12-19 DIAGNOSIS — E278 Other specified disorders of adrenal gland: Secondary | ICD-10-CM | POA: Diagnosis present

## 2020-12-19 DIAGNOSIS — M25562 Pain in left knee: Secondary | ICD-10-CM | POA: Diagnosis present

## 2020-12-19 DIAGNOSIS — E279 Disorder of adrenal gland, unspecified: Secondary | ICD-10-CM | POA: Diagnosis present

## 2020-12-19 DIAGNOSIS — F418 Other specified anxiety disorders: Secondary | ICD-10-CM | POA: Diagnosis present

## 2020-12-19 DIAGNOSIS — M25462 Effusion, left knee: Secondary | ICD-10-CM | POA: Diagnosis present

## 2020-12-19 DIAGNOSIS — E872 Acidosis, unspecified: Secondary | ICD-10-CM | POA: Diagnosis present

## 2020-12-19 DIAGNOSIS — N170 Acute kidney failure with tubular necrosis: Secondary | ICD-10-CM | POA: Diagnosis not present

## 2020-12-19 DIAGNOSIS — R7989 Other specified abnormal findings of blood chemistry: Secondary | ICD-10-CM | POA: Diagnosis present

## 2020-12-19 DIAGNOSIS — Z8249 Family history of ischemic heart disease and other diseases of the circulatory system: Secondary | ICD-10-CM

## 2020-12-19 DIAGNOSIS — Z8049 Family history of malignant neoplasm of other genital organs: Secondary | ICD-10-CM

## 2020-12-19 DIAGNOSIS — M25461 Effusion, right knee: Secondary | ICD-10-CM | POA: Diagnosis present

## 2020-12-19 LAB — PHOSPHORUS: Phosphorus: 5.6 mg/dL — ABNORMAL HIGH (ref 2.5–4.6)

## 2020-12-19 LAB — CBC WITH DIFFERENTIAL/PLATELET
Abs Immature Granulocytes: 0.06 10*3/uL (ref 0.00–0.07)
Basophils Absolute: 0 10*3/uL (ref 0.0–0.1)
Basophils Relative: 0 %
Eosinophils Absolute: 0.1 10*3/uL (ref 0.0–0.5)
Eosinophils Relative: 1 %
HCT: 34.4 % — ABNORMAL LOW (ref 36.0–46.0)
Hemoglobin: 10.7 g/dL — ABNORMAL LOW (ref 12.0–15.0)
Immature Granulocytes: 1 %
Lymphocytes Relative: 10 %
Lymphs Abs: 1.3 10*3/uL (ref 0.7–4.0)
MCH: 22.8 pg — ABNORMAL LOW (ref 26.0–34.0)
MCHC: 31.1 g/dL (ref 30.0–36.0)
MCV: 73.3 fL — ABNORMAL LOW (ref 80.0–100.0)
Monocytes Absolute: 0.9 10*3/uL (ref 0.1–1.0)
Monocytes Relative: 7 %
Neutro Abs: 10.1 10*3/uL — ABNORMAL HIGH (ref 1.7–7.7)
Neutrophils Relative %: 81 %
Platelets: 482 10*3/uL — ABNORMAL HIGH (ref 150–400)
RBC: 4.69 MIL/uL (ref 3.87–5.11)
RDW: 22.1 % — ABNORMAL HIGH (ref 11.5–15.5)
WBC: 12.4 10*3/uL — ABNORMAL HIGH (ref 4.0–10.5)
nRBC: 0 % (ref 0.0–0.2)

## 2020-12-19 LAB — COMPREHENSIVE METABOLIC PANEL
ALT: 40 U/L (ref 0–44)
AST: 64 U/L — ABNORMAL HIGH (ref 15–41)
Albumin: 3.7 g/dL (ref 3.5–5.0)
Alkaline Phosphatase: 88 U/L (ref 38–126)
Anion gap: 19 — ABNORMAL HIGH (ref 5–15)
BUN: 77 mg/dL — ABNORMAL HIGH (ref 6–20)
CO2: 20 mmol/L — ABNORMAL LOW (ref 22–32)
Calcium: 8.7 mg/dL — ABNORMAL LOW (ref 8.9–10.3)
Chloride: 93 mmol/L — ABNORMAL LOW (ref 98–111)
Creatinine, Ser: 9.5 mg/dL — ABNORMAL HIGH (ref 0.44–1.00)
GFR, Estimated: 4 mL/min — ABNORMAL LOW (ref >60)
Glucose, Bld: 84 mg/dL (ref 70–99)
Potassium: 4.9 mmol/L (ref 3.5–5.1)
Sodium: 132 mmol/L — ABNORMAL LOW (ref 135–145)
Total Bilirubin: 0.8 mg/dL (ref 0.3–1.2)
Total Protein: 8.3 g/dL — ABNORMAL HIGH (ref 6.5–8.1)

## 2020-12-19 LAB — URINALYSIS, MICROSCOPIC (REFLEX)

## 2020-12-19 LAB — URINALYSIS, ROUTINE W REFLEX MICROSCOPIC
Bilirubin Urine: NEGATIVE
Glucose, UA: NEGATIVE mg/dL
Ketones, ur: NEGATIVE mg/dL
Leukocytes,Ua: NEGATIVE
Nitrite: NEGATIVE
Protein, ur: 30 mg/dL — AB
Specific Gravity, Urine: 1.02 (ref 1.005–1.030)
pH: 6 (ref 5.0–8.0)

## 2020-12-19 LAB — LIPASE, BLOOD: Lipase: 62 U/L — ABNORMAL HIGH (ref 11–51)

## 2020-12-19 LAB — RESP PANEL BY RT-PCR (FLU A&B, COVID) ARPGX2
Influenza A by PCR: NEGATIVE
Influenza B by PCR: NEGATIVE
SARS Coronavirus 2 by RT PCR: NEGATIVE

## 2020-12-19 LAB — TROPONIN I (HIGH SENSITIVITY)
Troponin I (High Sensitivity): 6 ng/L (ref <18)
Troponin I (High Sensitivity): 5 ng/L (ref <18)

## 2020-12-19 LAB — CBG MONITORING, ED: Glucose-Capillary: 48 mg/dL — ABNORMAL LOW (ref 70–99)

## 2020-12-19 LAB — SODIUM, URINE, RANDOM: Sodium, Ur: 75 mmol/L

## 2020-12-19 LAB — CREATININE, URINE, RANDOM: Creatinine, Urine: 120.95 mg/dL

## 2020-12-19 LAB — LACTIC ACID, PLASMA
Lactic Acid, Venous: 2.2 mmol/L (ref 0.5–1.9)
Lactic Acid, Venous: 1.8 mmol/L (ref 0.5–1.9)

## 2020-12-19 LAB — URIC ACID: Uric Acid, Serum: 15.1 mg/dL — ABNORMAL HIGH (ref 2.5–7.1)

## 2020-12-19 LAB — MAGNESIUM: Magnesium: 1.7 mg/dL (ref 1.7–2.4)

## 2020-12-19 LAB — GLUCOSE, CAPILLARY: Glucose-Capillary: 100 mg/dL — ABNORMAL HIGH (ref 70–99)

## 2020-12-19 MED ORDER — HYDROCODONE-ACETAMINOPHEN 5-325 MG PO TABS
1.0000 | ORAL_TABLET | ORAL | Status: DC | PRN
  Administered 2020-12-19 – 2020-12-21 (×6): 2 via ORAL
  Filled 2020-12-19 (×6): qty 2

## 2020-12-19 MED ORDER — INSULIN ASPART 100 UNIT/ML IJ SOLN
0.0000 [IU] | Freq: Three times a day (TID) | INTRAMUSCULAR | Status: DC
  Administered 2020-12-20: 7 [IU] via SUBCUTANEOUS
  Administered 2020-12-21 (×3): 2 [IU] via SUBCUTANEOUS
  Administered 2020-12-22: 1 [IU] via SUBCUTANEOUS
  Administered 2020-12-22: 2 [IU] via SUBCUTANEOUS

## 2020-12-19 MED ORDER — SODIUM CHLORIDE 0.9 % IV SOLN
1.0000 g | INTRAVENOUS | Status: DC
  Administered 2020-12-19 – 2020-12-21 (×3): 1 g via INTRAVENOUS
  Filled 2020-12-19 (×3): qty 10

## 2020-12-19 MED ORDER — HEPARIN SODIUM (PORCINE) 5000 UNIT/ML IJ SOLN
5000.0000 [IU] | Freq: Three times a day (TID) | INTRAMUSCULAR | Status: DC
  Administered 2020-12-21: 5000 [IU] via SUBCUTANEOUS
  Filled 2020-12-19 (×4): qty 1

## 2020-12-19 MED ORDER — ONDANSETRON HCL 4 MG/2ML IJ SOLN: 4.0000 mg | Freq: Four times a day (QID) | INTRAMUSCULAR | Status: DC | PRN

## 2020-12-19 MED ORDER — MIRTAZAPINE 7.5 MG PO TABS
7.5000 mg | ORAL_TABLET | Freq: Every day | ORAL | Status: DC
  Filled 2020-12-19 (×4): qty 1

## 2020-12-19 MED ORDER — ACETAMINOPHEN 650 MG RE SUPP: 650.0000 mg | Freq: Four times a day (QID) | RECTAL | Status: DC | PRN

## 2020-12-19 MED ORDER — SODIUM CHLORIDE 0.9 % IV BOLUS
1000.0000 mL | Freq: Once | INTRAVENOUS | Status: AC
  Administered 2020-12-19: 1000 mL via INTRAVENOUS

## 2020-12-19 MED ORDER — ALBUTEROL SULFATE (2.5 MG/3ML) 0.083% IN NEBU: 3.0000 mL | INHALATION_SOLUTION | RESPIRATORY_TRACT | Status: DC | PRN

## 2020-12-19 MED ORDER — ACETAMINOPHEN 325 MG PO TABS: 650.0000 mg | ORAL_TABLET | Freq: Four times a day (QID) | ORAL | Status: DC | PRN

## 2020-12-19 MED ORDER — LIDOCAINE 5 % EX PTCH
1.0000 | MEDICATED_PATCH | CUTANEOUS | Status: DC
  Filled 2020-12-19 (×3): qty 1

## 2020-12-19 MED ORDER — ONDANSETRON HCL 4 MG PO TABS: 4.0000 mg | ORAL_TABLET | Freq: Four times a day (QID) | ORAL | Status: DC | PRN

## 2020-12-19 MED ORDER — CYCLOBENZAPRINE HCL 5 MG PO TABS
5.0000 mg | ORAL_TABLET | Freq: Three times a day (TID) | ORAL | Status: DC | PRN
  Administered 2020-12-19 – 2020-12-20 (×2): 5 mg via ORAL
  Filled 2020-12-19 (×2): qty 1

## 2020-12-19 MED ORDER — SODIUM CHLORIDE 0.9 % IV SOLN: INTRAVENOUS | Status: DC

## 2020-12-19 MED ORDER — PANTOPRAZOLE SODIUM 40 MG PO TBEC
40.0000 mg | DELAYED_RELEASE_TABLET | Freq: Every day | ORAL | Status: DC
  Administered 2020-12-19 – 2020-12-22 (×4): 40 mg via ORAL
  Filled 2020-12-19 (×4): qty 1

## 2020-12-19 MED ORDER — ZOLPIDEM TARTRATE 5 MG PO TABS
5.0000 mg | ORAL_TABLET | Freq: Every evening | ORAL | Status: DC | PRN
  Administered 2020-12-19 – 2020-12-21 (×3): 5 mg via ORAL
  Filled 2020-12-19 (×3): qty 1

## 2020-12-19 MED ORDER — NICOTINE 21 MG/24HR TD PT24
21.0000 mg | MEDICATED_PATCH | Freq: Every day | TRANSDERMAL | Status: DC
  Administered 2020-12-19 – 2020-12-22 (×4): 21 mg via TRANSDERMAL
  Filled 2020-12-19 (×4): qty 1

## 2020-12-19 MED ORDER — LACTATED RINGERS IV SOLN: INTRAVENOUS | Status: AC

## 2020-12-19 NOTE — ED Provider Notes (Signed)
Emergency Medicine Provider Triage Evaluation Note  Stacy [name] , a 55 y.o. female  was evaluated in triage.  Pt complains of chest pain, nausea, and vomiting.  States the pain is all over her chest and moved to her shoulder blades.  Symptoms have been ongoing for the past 3 days.  She has been unable to keep down her medication or any other food or fluids. Notably hypotensive and tachycardic, endorses significant weakness and lightheadedness for the past few days as well. History of hypertension, she has not taken her bp meds in several days.  Review of Systems  Positive:  Negative: See above  Physical Exam  BP (!) 80/63 (BP Location: Right Arm)   Pulse (!) 105   Temp 98.4 F (36.9 C) (Oral)   Resp 14   SpO2 100%  Gen:   Awake, no distress  Resp:  Normal effort  MSK:   Moves extremities without difficulty  Other:    Medical Decision Making  Medically screening exam initiated at 10:40 AM.  Appropriate orders placed.  Stacy [name] was informed that the remainder of the evaluation will be completed by another provider, this initial triage assessment does not replace that evaluation, and the importance of remaining in the ED until their evaluation is complete.  Hypotension and tachycardia, needs next available room   Nestor Lewandowsky 12/19/20 1045    Horton, Alvin Critchley, DO 12/20/20 1022

## 2020-12-19 NOTE — ED Notes (Signed)
Pt is still in [place] at this time

## 2020-12-19 NOTE — H&P (Addendum)
History and Physical    Stacy DEGUZMAN [name]:[id number] DOB: [birth year] DOA: 12/19/2020  PCP: Merryl Hacker, No Consultants:  cardiology: Dr. Oval Linsey, ortho: Dr. Lorin Mercy  Patient coming from:  Home - lives alone   Chief Complaint: RUQ pain, N/V and not feeling well x 3 days  HPI: Stacy [name] is a 55 y.o. female with medical history significant of HTN, HLD, GERD, T2DM, depression with anxiety, gout, nonobstructive CAD, left renal artery stenosis, tobacco abuse who presented to ED  Has been feeling weak, dizzy and nauseated since Tuesday. She has also had about 3 episodes of vomiting daily as well. She has not eaten anything but yogurt and a fruit cup since Tuesday. She just feels bad. She had a pressure in epigastric area, RUQ area and between her shoulder blades. Pain on the RUQ comes and goes and is sharp and stabbing and radiates across her stomach and behind her right shoulder blade. Rated as an 8/10. Worse with deep inhalation and twisting movement. She has been weak and gets out of breath easily, especially with exertion.   She was seen on 11/25 at [facility] for a cough and congestion. Flu and covid negative. Treated for acute bronchitis. Gave her a round of steroids, cough medication with hydrocodone and an albuterol inhaler. Feels better from this, still has cough but it's much better.   She has not had fevers, occasional chills. She has had headaches, but chronic, no palpitations, she has the RUQ pain, no diarrhea, no dysuria/frequency or urgency. Denies any color change in her urine or blood. She had a UA last month that was normal. No leg swelling. Denies any CVA tenderness. She feels like she is having a gout flair in her knees with increase pain and swelling.  She has been taking 800mg  ibuprofen TID since Tuesday as well as colchicine. NO alcohol since Tuesday. Drinks 1 drink/day. No hx of withdrawal. No hx of SLE in family.   She is smoker, smokes 1 PPD.    ED Course: vitals: afebrile, BP: 80/63,  HR: 105, RR: 14, oxygen: 100% room air Pertinent labs: WBC: 12.4, hgb: 10.7, platelets: 482, sodium: 132, BUN: 77, creatinine: 9.50, AST: 64, CO2: 20, AG: 19, lipase: 62, lactic acid: 2.2>1.8 CXR: no acute findings, hiatal hernia [place] abdo: no acute findings to explain RUQ pain. Hepatic steatosis.  In ED: 2L NS bolus.   Review of Systems: As per HPI; otherwise review of systems reviewed and negative.   Ambulatory Status:  Ambulates without assistance    Past Medical History:  Diagnosis Date   Acute bronchitis 08/02/2019   Adrenal adenoma    as per MRI 03/09/14   Allergy    Anxiety    Coronary artery calcification seen on CAT scan 04/12/2018   Depression    Diabetes mellitus    Diverticulosis    Elevated LFTs    elevated since 09/2013   Fatty liver    moderate- as per 03/06/17 MRI liver   GERD (gastroesophageal reflux disease)    Gout    H/O hiatal hernia    Headache(784.0)    History of depression    Hyperlipemia    Hyperplastic colon polyp    Hypertension    Positive hepatitis C antibody test    RNA NEGATIVE   Renal artery stenosis ([place]) 08/02/2019   06/2019: 1-59% stenosis on L    Thyroid nodule  managed Dr. Posey Pronto 02/27/2014   Tobacco abuse    Tubular adenoma of colon  Past Surgical History:  Procedure Laterality Date   ANAL FISTULECTOMY  01/19/1985   BREAST EXCISIONAL BIOPSY Left    BREAST LUMPECTOMY Left 1988   PAROTIDECTOMY  03/30/2011   Procedure: PAROTIDECTOMY;  Surgeon: Izora Gala, MD;  Location: [place];  Service: ENT;  Laterality: Right;   WRIST GANGLION EXCISION Left 1988    Social History   Socioeconomic History   Marital status: Widowed    Spouse name: Not on file   Number of children: 2   Years of education: Not on file   Highest education level: Not on file  Occupational History    Employer: [employer]  Tobacco Use   Smoking status: Every Day    Packs/day: 1.00    Years: 28.00    Pack years: 28.00    Types:  Cigarettes   Smokeless tobacco: Never  Vaping Use   Vaping Use: Never used  Substance and Sexual Activity   Alcohol use: Yes    Alcohol/week: 14.0 standard drinks    Types: 14 Standard drinks or equivalent per week   Drug use: No   Sexual activity: Not Currently    Birth control/protection: Post-menopausal  Other Topics Concern   Not on file  Social History Narrative   Married. 2 children.    Ages 31, 55 y/o ( entered 09/2012)      Works with [employer]   Social Determinants of Health   Financial Resource Strain: Not on file  Food Insecurity: Not on file  Transportation Needs: Not on file  Physical Activity: Not on file  Stress: Not on file  Social Connections: Not on file  Intimate Partner Violence: Not on file    Allergies  Allergen Reactions   Almond Meal Anaphylaxis and Swelling   Other Anaphylaxis    Almond Butter   Aspirin Nausea Only    Family History  Problem Relation Age of Onset   Coronary artery disease Father 12       CAD,CABG   Hypertension Mother    Stroke Mother    Diabetes Sister        uncontrolled   Cancer Sister 34       uterine cancer   Aneurysm Sister        brain   Heart attack Maternal Grandmother    Blindness Paternal Grandmother    Breast cancer Neg Hx    Colon cancer Neg Hx    Esophageal cancer Neg Hx    Rectal cancer Neg Hx    Stomach cancer Neg Hx     Prior to Admission medications   Medication Sig Start Date End Date Taking? Authorizing Provider  albuterol (VENTOLIN HFA) 108 (90 Base) MCG/ACT inhaler Inhale 2 puffs into the lungs every 4 (four) hours as needed for wheezing or shortness of breath. 08/02/19  Yes Skeet Latch, MD  amLODipine (NORVASC) 5 MG tablet TAKE 1 TABLET BY MOUTH DAILY. NEEDS TO MAKE F/U APPT FOR ADDITIONAL REFILLS. 06/23/19  Yes Elayne Snare, MD  atorvastatin (LIPITOR) 80 MG tablet Take 0.5 tablets (40 mg total) by mouth daily. 12/11/19  Yes Skeet Latch, MD  colchicine 0.6 MG  tablet Take 0.6 mg by mouth daily as needed (gout).   Yes [provider]  EPINEPHrine 0.3 mg/0.3 mL IJ SOAJ injection Insert into muscle once with any facial or neck swelling  Instruct in use 01/25/17  Yes [place], Modena Nunnery, MD  glimepiride (AMARYL) 1 MG tablet TAKE 1 TABLET BY MOUTH DAILY  BEFORE SUPPER. 02/16/20  Yes Elayne Snare, MD  HYDROcodone bit-homatropine (HYCODAN) 5-1.5 MG/5ML syrup Take 5 mLs by mouth every 4 (four) hours as needed for cough. 12/13/20  Yes [provider]  metFORMIN (GLUCOPHAGE) 850 MG tablet TAKE 1 TABLET (850 MG TOTAL) BY MOUTH 2 (TWO) TIMES DAILY WITH A MEAL. 12/27/19  Yes [place], Modena Nunnery, MD  mirtazapine (REMERON) 15 MG tablet TAKE 1/2 TABLET BY MOUTH AT BEDTIME 01/25/20  Yes [place], Modena Nunnery, MD  Multiple Vitamins-Minerals (ONE-A-DAY WOMENS VITACRAVES) CHEW Chew 1 each by mouth daily.   Yes [provider]  pioglitazone (ACTOS) 15 MG tablet Take 1 tablet (15 mg total) by mouth daily. Need follow up appointment 04/12/20  Yes Elayne Snare, MD  spironolactone (ALDACTONE) 50 MG tablet Take 1 tablet (50 mg total) by mouth daily. 02/16/20  Yes Elayne Snare, MD  SUMAtriptan (IMITREX) 100 MG tablet Take 1 tablet (100 mg total) by mouth every 2 (two) hours as needed for migraine. May repeat in 2 hours if headache persists or recurs. 08/05/15  Yes [place], Modena Nunnery, MD  valsartan-hydrochlorothiazide (DIOVAN-HCT) 160-12.5 MG tablet Take 1 tablet by mouth daily. 04/12/20  Yes Skeet Latch, MD  allopurinol (ZYLOPRIM) 100 MG tablet TAKE ONE TABLET BY MOUTH DAILY. START ON 04/28/2017. Patient not taking: Reported on 03/20/2020 02/28/19   Alycia Rossetti, MD  aspirin EC 81 MG tablet Take 1 tablet (81 mg total) by mouth daily. Swallow whole. Patient not taking: Reported on 12/19/2020 08/02/19   Skeet Latch, MD  Empagliflozin-metFORMIN HCl ER (SYNJARDY XR) 05-998 MG TB24 Take 2 tablets by mouth daily. Patient not taking: Reported on 12/19/2020 06/23/19   Elayne Snare, MD  FLUoxetine (PROZAC) 40 MG capsule TAKE 1 CAPSULE BY MOUTH DAILY. Patient not taking: Reported on 12/19/2020 04/12/20   Susy Frizzle, MD  gabapentin (NEURONTIN) 300 MG capsule Take 1 capsule (300 mg total) by mouth 3 (three) times daily. 3 times a day when necessary neuropathy pain Patient not taking: Reported on 12/19/2020 07/15/20   Newt Minion, MD  glucose blood test strip Use as instructed to monitor FSBS 3x daily. Dx: E11.65 01/02/15   Alycia Rossetti, MD  ibuprofen (ADVIL) 800 MG tablet Take 1 tablet (800 mg total) by mouth every 8 (eight) hours as needed. Patient not taking: Reported on 12/19/2020 07/15/20   Newt Minion, MD  Lancets MISC Use as instructed to monitor FSBS 3x daily. Dx: E11.65 01/03/15   Alycia Rossetti, MD  pantoprazole (PROTONIX) 40 MG tablet Take 1 tablet (40 mg total) by mouth daily. Patient not taking: Reported on 12/19/2020 01/25/20   Alycia Rossetti, MD  zolpidem (AMBIEN) 10 MG tablet Take 1 tablet (10 mg total) by mouth at bedtime as needed. for sleep Patient not taking: Reported on 12/19/2020 04/12/20   Susy Frizzle, MD    Physical Exam: Vitals:   12/19/20 1430 12/19/20 1445 12/19/20 1525 12/19/20 1530  BP: 98/61 101/65 112/72 106/71  Pulse: 92 87 96 95  Resp: 15 14 18  (!) 26  Temp:      TempSrc:      SpO2: 100% 100% 100% 100%     General:  Appears calm and comfortable and is in NAD Eyes:  PERRL, EOMI, normal lids, iris ENT:  grossly normal hearing, lips & tongue, mmm; appropriate dentition Neck:  no LAD, masses or thyromegaly; no carotid bruits Cardiovascular:  RRR, no m/r/g. No LE edema.  Respiratory:   CTA bilaterally with no  wheezes/rales/rhonchi.  Normal respiratory effort. But painful under right shoulder blade with deep inspiration.  Abdomen:  soft, NT, ND, NABS Back:   normal alignment, no CVAT Skin:  no rash or induration seen on limited exam Musculoskeletal:  grossly normal tone BUE/BLE, good ROM, no bony abnormality.  Bilateral knees with effusion and edema.  Lower extremity:  No LE edema.  Limited foot exam with no ulcerations.  2+ distal pulses. Psychiatric:  grossly normal mood and affect, speech fluent and appropriate, AOx3 Neurologic:  CN 2-12 grossly intact, moves all extremities in coordinated fashion, sensation intact   Radiological Exams on Admission: Independently reviewed - see discussion in A/P where applicable  DG Chest 2 View  Result Date: 12/19/2020 CLINICAL DATA:  Chest pain, body aches and fever over the last week EXAM: CHEST - 2 VIEW COMPARISON:  01/04/2018 FINDINGS: Heart size is normal. There is a hiatal hernia. The lungs are clear. No infiltrate, collapse or effusion. No abnormal bone finding. IMPRESSION: No active cardiopulmonary disease.  Hiatal hernia. Electronically Signed   By: Nelson Chimes M.D.   On: 12/19/2020 11:04   US Abdomen Complete  Result Date: 12/19/2020 CLINICAL DATA:  Right upper quadrant pain EXAM: ABDOMEN ULTRASOUND COMPLETE COMPARISON:  None. FINDINGS: Gallbladder: No gallstones or wall thickening visualized. No sonographic Murphy sign noted by sonographer. Common bile duct: Diameter: 5.6 Liver: Focal fatty sparing seen near the gallbladder fossa. No suspicious focal lesion identified. Increased parenchymal echogenicity. Portal vein is patent on color Doppler imaging with normal direction of blood flow towards the liver. IVC: No abnormality visualized. Pancreas: Visualized portion unremarkable. Spleen: Size and appearance within normal limits. Right Kidney: Length: 9.8 cm. Echogenicity within normal limits. No mass or hydronephrosis visualized. Simple cyst of the right kidney measuring 0.8 x 0.6 x 0.6 cm. Left Kidney: Length: 10.8 cm. Echogenicity within normal limits. No mass or hydronephrosis visualized. Abdominal aorta: No aneurysm visualized. Other findings: Solid nodule of the right upper quadrant measuring 3.3 x 2.8 x 2.9 cm which correlates with right adrenal gland  nodule seen on prior CT, previously characterized as benign adenoma. IMPRESSION: 1. No sonographic findings to explain right upper quadrant pain. 2. Hepatic steatosis. Electronically Signed   By: Yetta Glassman M.D.   On: 12/19/2020 14:22    EKG: Independently reviewed.  Sinus tachycardia with rate 108; nonspecific ST changes with no evidence of acute ischemia   Labs on Admission: I have personally reviewed the available labs and imaging studies at the time of the admission.  Pertinent labs:  WBC: 12.4,  hgb: 10.7,  platelets: 482,  sodium: 132,  BUN: 77,  creatinine: 9.50,  AST: 64,  CO2: 20,  AG: 19,  lipase: 62,  lactic acid: 2.2>1.8   Assessment/Plan Principal Problem:   Acute renal failure  55 year old female presenting with 3 day history of N/V, RUQ pain with radiation to her right shoulder blade, shortness of breath and fatigue and just not feeling well found to be in acute renal failure.  -place in observation -has hx of renal artery stenosis on left with 1-59% stenosis based off renal doppler 7/21. Has not had repeat, have ordered this to be done.  -pre renal vs. Intrinsic disease. Urine studies pending, no obstruction or other acute finding on [place] today.  -checking ANA with reflex, + in past.  -received 2L bolus in Ed, will continue with gentle IVF -has been on NSAID TID since Tuesday as well as metformin, spironolactone, ARB, hctz. Holding all nephrotoxic  drugs -intake/output -nephrology has been consulted    Active Problems: N/V -? GI illness vs. Due to renal failure -gentle IVF -zofran prn  RUQ pain/shoulder pain -troponin negative -lipase mildly elevated -ultrasound negative for gallbladder disease -appears to be more MSK, hx makes me think subluxated rib from coughing -muscle relaxer prn/norco prn, follow    Increased anion gap metabolic acidosis -likely secondary to AKI/hypovolemia  -IVF and follow -nephrology consult     Leukocytosis Likely  reactive in setting of illness, vomiting and decreased PO intake. Has not had a fever, CXR clear, UA pending. Lactic acid initally elevated, but likely due to hypovolemia -trend fever curve, follow cbc    Renal artery stenosis (HCC) Renal duplex pending     Anemia with thrombocytopenia  -appears to be new from one year ago.  -iron deficiency vs. ACD -checking iron studies -denies any blood in stool or other bleeding  -colonoscopy 12/2018, repeat 5 years for hx of adenomatous polyps    Fatty liver AST slightly more elevated, but could also be elevated in setting of GI viral illness, continue to monitor.  -followed by GI. Previous elastography showed fibrosis F2 with some F3.  -has cut down significant on alcohol intake.   Gout flair of knees  -she has bilateral knee effusion and swelling -checking uric acid/RF and ANA -typically gets drained by ortho, but discussed would want labs back first -prednisone if needed     Type 2 diabetes mellitus with other specified complication (HCC) B3A 6 months ago 8.1, followed by endocrinology -holding metformin, actos and glimeperide -sensitive SSI with routine accuchecks and carb modified diet     Hypertension Soft blood pressures with AKI holding anti hypertensive medication.     Hyperlipemia Hold statin with significant AKI     GERD (gastroesophageal reflux disease) Continue protonix     Depression with anxiety Stable, continue remeron qhs.     Adrenal mass, right Dr Erskine Squibb -seen again on imaging, followed outpatient.     Insomnia Ambien prn     Tobacco use  Nicotine patch, encouraged cessation.    There is no height or weight on file to calculate BMI.   Level of care: Telemetry Medical DVT prophylaxis: heparin   Code Status:  Full - confirmed with patient Family Communication: None present  Disposition Plan:  The patient is from: home  Anticipated d/c is to: home   Patient placed in observation as  anticipate less than 2 midnight stay. Requires hospitalization due for acute renal failure, need for IVF, further work up and MDM with specialists.    Patient is currently: stable  Consults called: nephrology   Admission status:  observation    Orma Flaming MD [facility]   How to contact the [facility] Attending or Consulting provider [place] or covering provider during after hours [place], for this patient?  Check the care team in [facility] and look for a) attending/consulting [facility] provider listed and b) the [facility] team listed Log into [email or web address] and use [place]'s universal password to access. If you do not have the password, please contact the hospital operator. Locate the [facility] provider you are looking for under [facility] and page to a number that you can be directly reached. If you still have difficulty reaching the provider, please page the [facility] (Director on Call) for the Hospitalists listed on amion for assistance.   12/19/2020, 4:24 PM

## 2020-12-19 NOTE — ED Notes (Signed)
Pt is at vascular

## 2020-12-19 NOTE — Consult Note (Signed)
Nephrology Consult   Requesting provider: Orma Flaming Service requesting consult: Hospitalist Reason for consult: AKI   Assessment/Recommendations: JANIFER GIESELMAN is a/an 55 y.o. female with a past medical history HTN, HLD, DM2, GERD, gout, tobacco use disorder who present w/ RUQ pain found to have severe AKI   Severe Non-oliguric AKI: Likely secondary to ATN from dehydration plus multiple insulting medications and hypotension. -Continue with hydration, lactated Ringer's 100 cc/hr -Agree with holding home blood pressure medications including ARB and diuretics -Hold further NSAIDs -Continue to monitor daily Cr, Dose meds for GFR -Monitor Daily I/Os, Daily weight  -Maintain MAP>65 for optimal renal perfusion.  -Avoid nephrotoxic medications including NSAIDs and Vanc/Zosyn combo -Currently no indication for HD; no signs of uremia  Pyuria: No obvious symptoms of UTI but some pyuria on urinalysis.  Also with leukocytosis.  Will treat with ceftriaxone until urine culture returns.  It is possible if urine culture is negative that she has some interstitial nephritis from NSAIDs  Bilateral knee pain: Most likely gout per patient's history but looks like ANA is ordered.  We will follow-up results and consider further serological work-up if kidney function does not improve  Right upper quadrant pain: Unclear cause.  Ultrasound reassuring.  Defer work-up and management per primary  Hypertension: Blood pressure low at this time.  Hold all home medications DM2: Management per primary team Fatty liver: Chronic issue.  Slight elevation in LFTs.  Management per primary   Recommendations conveyed to primary service.    [facility] 12/19/2020 4:47 PM   _____________________________________________________________________________________ CC: RUQ pain  History of Present Illness: Stacy [name] is a/an 55 y.o. female with a past medical history of HTN, HLD,  DM2, GERD, gout, tobacco use disorder who presents with right upper quadrant pain.  The patient states that she started feeling poorly on Tuesday.  She began to have right upper quadrant pain with nausea and vomiting.  She also had bilateral knee pain that she felt was consistent with prior gout flares.  She was taking ibuprofen 800 mg 3 times daily as well as some steroids provided for gout flares.  She also noticed worsening weakness, fatigue, dizziness.  She notes that her blood pressure is typically much higher but has been lower since she has been feeling ill.  The symptoms were associated with pain in her right upper quadrant that comes and goes.  She describes it as stabbing and it will radiate to her right shoulder blade.  Has not had pain like this before.  It is worse with deep inspiration.  She experiences fatigue with exertion as well as some dyspnea but mainly just becomes tired.  No other shortness of breath, orthopnea, lower extremity edema.  Never told she has a problem with her kidneys before.  She feels like she has continued to urinate normally.  No history of kidney stones or blood in her urine.  Longstanding hypertension but no new medications recently.  She has been taking her medications as prescribed. Does have a history of RAS but was <60%.  The emergency department she was noted to be relatively hypotensive on arrival with a systolic blood pressure of 80.  Labs demonstrated creatinine of 9.5 with baseline being 1.1 in June 2022.  Urinalysis with some pyuria and rare RBCs.  Urine sodium 75 was obtained after receiving IV fluids.  Did receive 2 L of normal saline in the emergency department.  Renal ultrasound with some hepatic steatosis but no acute  concerns.   Medications:  Current Facility-Administered Medications  Medication Dose Route Frequency Provider Last Rate Last Admin   0.9 %  sodium chloride infusion   Intravenous Continuous Orma Flaming, MD       acetaminophen  (TYLENOL) tablet 650 mg  650 mg Oral Q6H PRN Orma Flaming, MD       Or   acetaminophen (TYLENOL) suppository 650 mg  650 mg Rectal Q6H PRN Orma Flaming, MD       albuterol (VENTOLIN HFA) 108 (90 Base) MCG/ACT inhaler 2 puff  2 puff Inhalation Q4H PRN Orma Flaming, MD       heparin injection 5,000 Units  5,000 Units Subcutaneous Q8H Orma Flaming, MD       HYDROcodone-acetaminophen (NORCO/VICODIN) 5-325 MG per tablet 1-2 tablet  1-2 tablet Oral Q4H PRN Orma Flaming, MD       insulin aspart (novoLOG) injection 0-9 Units  0-9 Units Subcutaneous TID WC Orma Flaming, MD       lactated ringers infusion   Intravenous Continuous Reesa Chew, MD       mirtazapine (REMERON) tablet 7.5 mg  7.5 mg Oral QHS Orma Flaming, MD       nicotine (NICODERM CQ - dosed in mg/24 hours) patch 21 mg  21 mg Transdermal Daily Orma Flaming, MD       ondansetron [facility]) tablet 4 mg  4 mg Oral Q6H PRN Orma Flaming, MD       Or   ondansetron [facility]) injection 4 mg  4 mg Intravenous Q6H PRN Orma Flaming, MD       pantoprazole (PROTONIX) EC tablet 40 mg  40 mg Oral Daily Orma Flaming, MD       zolpidem (AMBIEN) tablet 5 mg  5 mg Oral QHS PRN Orma Flaming, MD       Current Outpatient Medications  Medication Sig Dispense Refill   albuterol (VENTOLIN HFA) 108 (90 Base) MCG/ACT inhaler Inhale 2 puffs into the lungs every 4 (four) hours as needed for wheezing or shortness of breath. 6.7 g 1   amLODipine (NORVASC) 5 MG tablet TAKE 1 TABLET BY MOUTH DAILY. NEEDS TO MAKE F/U APPT FOR ADDITIONAL REFILLS. 30 tablet 1   atorvastatin (LIPITOR) 80 MG tablet Take 0.5 tablets (40 mg total) by mouth daily. 90 tablet 1   colchicine 0.6 MG tablet Take 0.6 mg by mouth daily as needed (gout).     EPINEPHrine 0.3 mg/0.3 mL IJ SOAJ injection Insert into muscle once with any facial or neck swelling  Instruct in use 1 Device 1   glimepiride (AMARYL) 1 MG tablet TAKE 1 TABLET BY MOUTH DAILY BEFORE SUPPER. 30 tablet 0    HYDROcodone bit-homatropine (HYCODAN) 5-1.5 MG/5ML syrup Take 5 mLs by mouth every 4 (four) hours as needed for cough.     metFORMIN (GLUCOPHAGE) 850 MG tablet TAKE 1 TABLET (850 MG TOTAL) BY MOUTH 2 (TWO) TIMES DAILY WITH A MEAL. 180 tablet 3   mirtazapine (REMERON) 15 MG tablet TAKE 1/2 TABLET BY MOUTH AT BEDTIME 45 tablet 2   Multiple Vitamins-Minerals (ONE-A-DAY WOMENS VITACRAVES) CHEW Chew 1 each by mouth daily.     pioglitazone (ACTOS) 15 MG tablet Take 1 tablet (15 mg total) by mouth daily. Need follow up appointment 30 tablet 0   spironolactone (ALDACTONE) 50 MG tablet Take 1 tablet (50 mg total) by mouth daily. 30 tablet 0   SUMAtriptan (IMITREX) 100 MG tablet Take 1 tablet (100 mg total) by mouth every 2 (two) hours  as needed for migraine. May repeat in 2 hours if headache persists or recurs. 9 tablet 0   valsartan-hydrochlorothiazide (DIOVAN-HCT) 160-12.5 MG tablet Take 1 tablet by mouth daily. 90 tablet 1   allopurinol (ZYLOPRIM) 100 MG tablet TAKE ONE TABLET BY MOUTH DAILY. START ON 04/28/2017. (Patient not taking: Reported on 03/20/2020) 30 tablet 6   aspirin EC 81 MG tablet Take 1 tablet (81 mg total) by mouth daily. Swallow whole. (Patient not taking: Reported on 12/19/2020) 90 tablet 3   Empagliflozin-metFORMIN HCl ER (SYNJARDY XR) 05-998 MG TB24 Take 2 tablets by mouth daily. (Patient not taking: Reported on 12/19/2020) 60 tablet 1   FLUoxetine (PROZAC) 40 MG capsule TAKE 1 CAPSULE BY MOUTH DAILY. (Patient not taking: Reported on 12/19/2020) 90 capsule 0   gabapentin (NEURONTIN) 300 MG capsule Take 1 capsule (300 mg total) by mouth 3 (three) times daily. 3 times a day when necessary neuropathy pain (Patient not taking: Reported on 12/19/2020) 90 capsule 3   glucose blood test strip Use as instructed to monitor FSBS 3x daily. Dx: E11.65 200 each 12   ibuprofen (ADVIL) 800 MG tablet Take 1 tablet (800 mg total) by mouth every 8 (eight) hours as needed. (Patient not taking: Reported on  12/19/2020) 30 tablet 0   Lancets MISC Use as instructed to monitor FSBS 3x daily. Dx: E11.65 100 each 11   pantoprazole (PROTONIX) 40 MG tablet Take 1 tablet (40 mg total) by mouth daily. (Patient not taking: Reported on 12/19/2020) 90 tablet 1   zolpidem (AMBIEN) 10 MG tablet Take 1 tablet (10 mg total) by mouth at bedtime as needed. for sleep (Patient not taking: Reported on 12/19/2020) 30 tablet 0     ALLERGIES Almond meal, Other, and Aspirin  MEDICAL HISTORY Past Medical History:  Diagnosis Date   Acute bronchitis 08/02/2019   Adrenal adenoma    as per MRI 03/09/14   Allergy    Anxiety    Coronary artery calcification seen on CAT scan 04/12/2018   Depression    Diabetes mellitus    Diverticulosis    Elevated LFTs    elevated since 09/2013   Fatty liver    moderate- as per 03/06/17 MRI liver   GERD (gastroesophageal reflux disease)    Gout    H/O hiatal hernia    Headache(784.0)    History of depression    Hyperlipemia    Hyperplastic colon polyp    Hypertension    Positive hepatitis C antibody test    RNA NEGATIVE   Renal artery stenosis ([place]) 08/02/2019   06/2019: 1-59% stenosis on L    Thyroid nodule  managed Dr. Posey Pronto 02/27/2014   Tobacco abuse    Tubular adenoma of colon      SOCIAL HISTORY Social History   Socioeconomic History   Marital status: Widowed    Spouse name: Not on file   Number of children: 2   Years of education: Not on file   Highest education level: Not on file  Occupational History    Employer: [employer]  Tobacco Use   Smoking status: Every Day    Packs/day: 1.00    Years: 28.00    Pack years: 28.00    Types: Cigarettes   Smokeless tobacco: Never  Vaping Use   Vaping Use: Never used  Substance and Sexual Activity   Alcohol use: Yes    Alcohol/week: 14.0 standard drinks    Types: 14 Standard drinks or equivalent per week   Drug  use: No   Sexual activity: Not Currently    Birth control/protection: Post-menopausal   Other Topics Concern   Not on file  Social History Narrative   Married. 2 children.    Ages 16, 4 y/o ( entered 09/2012)      Works with [employer]   Social Determinants of Health   Financial Resource Strain: Not on file  Food Insecurity: Not on file  Transportation Needs: Not on file  Physical Activity: Not on file  Stress: Not on file  Social Connections: Not on file  Intimate Partner Violence: Not on file     FAMILY HISTORY Family History  Problem Relation Age of Onset   Coronary artery disease Father 40       CAD,CABG   Hypertension Mother    Stroke Mother    Diabetes Sister        uncontrolled   Cancer Sister 72       uterine cancer   Aneurysm Sister        brain   Heart attack Maternal Grandmother    Blindness Paternal Grandmother    Breast cancer Neg Hx    Colon cancer Neg Hx    Esophageal cancer Neg Hx    Rectal cancer Neg Hx    Stomach cancer Neg Hx       Review of Systems: 12 systems reviewed Otherwise as per HPI, all other systems reviewed and negative  Physical Exam: Vitals:   12/19/20 1615 12/19/20 1630  BP: 103/64 106/63  Pulse: 90 90  Resp: (!) 24 16  Temp:    SpO2: 100% 100%   Total I/O In: 999 [IV Piggyback:999] Out: -   Intake/Output Summary (Last 24 hours) at 12/19/2020 1647 Last data filed at 12/19/2020 1422 Gross per 24 hour  Intake 999 ml  Output --  Net 999 ml   General: well-appearing, no acute distress HEENT: anicteric sclera, oropharynx clear without lesions CV: regular rate, normal rhythm, no murmurs, no gallops, no rubs, no peripheral edema Lungs: clear to auscultation bilaterally, normal work of breathing Abd: soft, non-tender, non-distended Skin: no visible lesions or rashes Psych: alert, engaged, appropriate mood and affect Musculoskeletal: no obvious deformities Neuro: normal speech, no gross focal deficits   Test Results Reviewed Lab Results  Component Value Date   NA 132 (L)  12/19/2020   K 4.9 12/19/2020   CL 93 (L) 12/19/2020   CO2 20 (L) 12/19/2020   BUN 77 (H) 12/19/2020   CREATININE 9.50 (H) 12/19/2020   GFR 95.71 06/06/2019   GLU 117 02/22/2016   CALCIUM 8.7 (L) 12/19/2020   ALBUMIN 3.7 12/19/2020     I have reviewed all relevant outside healthcare records related to the patient's current hospitalization

## 2020-12-19 NOTE — ED Provider Notes (Addendum)
[facility] EMERGENCY DEPARTMENT Provider Note   CSN: [id number] Arrival date & time: 12/19/20  3875     History Chief Complaint  Patient presents with   Chest Pain    Stacy [name] is a 54 y.o. female.  HPI  55 year old female with past medical history of HTN, HLD, DM presents emergency department epigastric abdominal pain, nausea/vomiting.  Patient states this is been going on for the past 3 days.  A couple weeks ago she had a nonspecific illness that she thought was COVID although she tested negative.  Since then she has not felt back to baseline.  However 3 days ago her abdomen started hurting, she got referred pain to her back/shoulder blades associated with nonbloody nausea/vomiting.  Denies any diarrhea.  She has had chills but denies any fever.  Past Medical History:  Diagnosis Date   Acute bronchitis 08/02/2019   Adrenal adenoma    as per MRI 03/09/14   Allergy    Anxiety    Coronary artery calcification seen on CAT scan 04/12/2018   Depression    Diabetes mellitus    Diverticulosis    Elevated LFTs    elevated since 09/2013   Fatty liver    moderate- as per 03/06/17 MRI liver   GERD (gastroesophageal reflux disease)    Gout    H/O hiatal hernia    Headache(784.0)    History of depression    Hyperlipemia    Hyperplastic colon polyp    Hypertension    Positive hepatitis C antibody test    RNA NEGATIVE   Renal artery stenosis ([place]) 08/02/2019   06/2019: 1-59% stenosis on L    Thyroid nodule  managed Dr. Posey Pronto 02/27/2014   Tobacco abuse    Tubular adenoma of colon     Patient Active Problem List   Diagnosis Date Noted   Renal artery stenosis ([place]) 08/02/2019   Acute bronchitis 08/02/2019   Adenoma of right adrenal gland 01/16/2019   Coronary artery calcification seen on CAT scan 04/12/2018   Insomnia 01/01/2015   Fatty liver 01/01/2015   Adrenal mass, right Dr Erskine Squibb 03/13/2014   Thyroid nodule  managed Dr. Posey Pronto 02/27/2014    EtOH dependence ([place]) 11/22/2013   Bereavement 10/04/2013   Tobacco use 10/04/2013   MDD (major depressive disorder) 08/23/2013   Colon polyp 08/23/2013   Migraines 08/23/2013   GERD (gastroesophageal reflux disease)    Depression with anxiety    Type 2 diabetes mellitus with other specified complication ([place])    Gout    Hypertension    Hyperlipemia     Past Surgical History:  Procedure Laterality Date   ANAL FISTULECTOMY  01/19/1985   BREAST EXCISIONAL BIOPSY Left    BREAST LUMPECTOMY Left 1988   PAROTIDECTOMY  03/30/2011   Procedure: PAROTIDECTOMY;  Surgeon: Izora Gala, MD;  Location: [place];  Service: ENT;  Laterality: Right;   WRIST GANGLION EXCISION Left 1988     OB History   No obstetric history on file.     Family History  Problem Relation Age of Onset   Coronary artery disease Father 49       CAD,CABG   Hypertension Mother    Stroke Mother    Diabetes Sister        uncontrolled   Cancer Sister 26       uterine cancer   Aneurysm Sister        brain   Heart attack Maternal Grandmother  Blindness Paternal Grandmother    Breast cancer Neg Hx    Colon cancer Neg Hx    Esophageal cancer Neg Hx    Rectal cancer Neg Hx    Stomach cancer Neg Hx     Social History   Tobacco Use   Smoking status: Every Day    Packs/day: 1.00    Years: 28.00    Pack years: 28.00    Types: Cigarettes   Smokeless tobacco: Never  Vaping Use   Vaping Use: Never used  Substance Use Topics   Alcohol use: Yes    Alcohol/week: 14.0 standard drinks    Types: 14 Standard drinks or equivalent per week   Drug use: No    Home Medications Prior to Admission medications   Medication Sig Start Date End Date Taking? Authorizing Provider  albuterol (VENTOLIN HFA) 108 (90 Base) MCG/ACT inhaler Inhale 2 puffs into the lungs every 4 (four) hours as needed for wheezing or shortness of breath. 08/02/19   Skeet Latch, MD  allopurinol (ZYLOPRIM) 100 MG tablet TAKE  ONE TABLET BY MOUTH DAILY. START ON 04/28/2017. Patient not taking: Reported on 03/20/2020 02/28/19   Alycia Rossetti, MD  amLODipine (NORVASC) 5 MG tablet TAKE 1 TABLET BY MOUTH DAILY. NEEDS TO MAKE F/U APPT FOR ADDITIONAL REFILLS. 06/23/19   Elayne Snare, MD  aspirin EC 81 MG tablet Take 1 tablet (81 mg total) by mouth daily. Swallow whole. 08/02/19   Skeet Latch, MD  atorvastatin (LIPITOR) 80 MG tablet Take 0.5 tablets (40 mg total) by mouth daily. 12/11/19   Skeet Latch, MD  colchicine 0.6 MG tablet Take 0.6 mg by mouth as needed.    [provider]  Empagliflozin-metFORMIN HCl ER (SYNJARDY XR) 05-998 MG TB24 Take 2 tablets by mouth daily. 06/23/19   Elayne Snare, MD  EPINEPHrine 0.3 mg/0.3 mL IJ SOAJ injection Insert into muscle once with any facial or neck swelling  Instruct in use 01/25/17   Alycia Rossetti, MD  FLUoxetine (PROZAC) 40 MG capsule TAKE 1 CAPSULE BY MOUTH DAILY. 04/12/20   Susy Frizzle, MD  gabapentin (NEURONTIN) 300 MG capsule Take 1 capsule (300 mg total) by mouth 3 (three) times daily. 3 times a day when necessary neuropathy pain 07/15/20   Newt Minion, MD  glimepiride (AMARYL) 1 MG tablet TAKE 1 TABLET BY MOUTH DAILY BEFORE SUPPER. 02/16/20   Elayne Snare, MD  glucose blood test strip Use as instructed to monitor FSBS 3x daily. Dx: E11.65 01/02/15   Alycia Rossetti, MD  HYDROcodone bit-homatropine [facility]) 5-1.5 MG/5ML syrup Take 5 mLs by mouth every 4 (four) hours as needed. 12/13/20   [provider]  hydrOXYzine (VISTARIL) 25 MG capsule SMARTSIG:1 Capsule(s) By Mouth PRN 12/11/20   [provider]  ibuprofen (ADVIL) 800 MG tablet Take 1 tablet (800 mg total) by mouth every 8 (eight) hours as needed. 07/15/20   Newt Minion, MD  Lancets MISC Use as instructed to monitor FSBS 3x daily. Dx: E11.65 01/03/15   Alycia Rossetti, MD  metFORMIN (GLUCOPHAGE) 850 MG tablet TAKE 1 TABLET (850 MG TOTAL) BY MOUTH 2 (TWO) TIMES DAILY WITH A MEAL.  12/27/19   Alycia Rossetti, MD  methylPREDNISolone (MEDROL DOSEPAK) 4 MG TBPK tablet See admin instructions. 12/13/20   [provider]  mirtazapine (REMERON) 15 MG tablet TAKE 1/2 TABLET BY MOUTH AT BEDTIME 01/25/20   [place], Modena Nunnery, MD  Multiple Vitamins-Minerals (ONE-A-DAY WOMENS VITACRAVES) CHEW Chew 1 each by  mouth daily.    [provider]  pantoprazole (PROTONIX) 40 MG tablet Take 1 tablet (40 mg total) by mouth daily. 01/25/20   Alycia Rossetti, MD  pioglitazone (ACTOS) 15 MG tablet Take 1 tablet (15 mg total) by mouth daily. Need follow up appointment 04/12/20   Elayne Snare, MD  spironolactone (ALDACTONE) 50 MG tablet Take 1 tablet (50 mg total) by mouth daily. 02/16/20   Elayne Snare, MD  SUMAtriptan (IMITREX) 100 MG tablet Take 1 tablet (100 mg total) by mouth every 2 (two) hours as needed for migraine. May repeat in 2 hours if headache persists or recurs. 08/05/15   [place], Modena Nunnery, MD  valsartan-hydrochlorothiazide (DIOVAN-HCT) 160-12.5 MG tablet Take 1 tablet by mouth daily. 04/12/20   Skeet Latch, MD  zolpidem (AMBIEN) 10 MG tablet Take 1 tablet (10 mg total) by mouth at bedtime as needed. for sleep 04/12/20   Susy Frizzle, MD    Allergies    Almond meal, Other, and Aspirin  Review of Systems   Review of Systems  Constitutional:  Positive for chills. Negative for fever.  HENT:  Negative for congestion.   Eyes:  Negative for visual disturbance.  Respiratory:  Negative for shortness of breath.   Cardiovascular:  Negative for chest pain.  Gastrointestinal:  Positive for abdominal pain, nausea and vomiting. Negative for diarrhea.  Genitourinary:  Negative for dysuria.  Skin:  Negative for rash.  Neurological:  Negative for headaches.   Physical Exam Updated Vital Signs BP (!) 143/83 (BP Location: Right Arm)   Pulse 95   Temp 98.4 F (36.9 C) (Oral)   Resp 16   SpO2 100%   Physical Exam Vitals and nursing note reviewed.  Constitutional:       General: She is not in acute distress.    Appearance: Normal appearance.  HENT:     Head: Normocephalic.     Mouth/Throat:     Mouth: Mucous membranes are moist.  Cardiovascular:     Rate and Rhythm: Normal rate.  Pulmonary:     Effort: Pulmonary effort is normal. No respiratory distress.  Abdominal:     Palpations: Abdomen is soft.     Comments: +TTP RUQ   Skin:    General: Skin is warm.  Neurological:     Mental Status: She is alert and oriented to person, place, and time. Mental status is at baseline.  Psychiatric:        Mood and Affect: Mood normal.    ED Results / Procedures / Treatments   Labs (all labs ordered are listed, but only abnormal results are displayed) Labs Reviewed  CBC WITH DIFFERENTIAL/PLATELET - Abnormal; Notable for the following components:      Result Value   WBC 12.4 (*)    Hemoglobin 10.7 (*)    HCT 34.4 (*)    MCV 73.3 (*)    MCH 22.8 (*)    RDW 22.1 (*)    Platelets 482 (*)    Neutro Abs 10.1 (*)    All other components within normal limits  COMPREHENSIVE METABOLIC PANEL  LIPASE, BLOOD  URINALYSIS, ROUTINE W REFLEX MICROSCOPIC  LACTIC ACID, PLASMA  LACTIC ACID, PLASMA  TROPONIN I (HIGH SENSITIVITY)  TROPONIN I (HIGH SENSITIVITY)    EKG EKG Interpretation  Date/Time:  Thursday December 19 2020 09:56:39 EST Ventricular Rate:  108 PR Interval:  124 QRS Duration: 74 QT Interval:  310 QTC Calculation: 415 R Axis:   62 Text Interpretation: Sinus tachycardia Low  voltage QRS Borderline ECG Confirmed by Lavenia Atlas [id number]) on 12/19/2020 11:41:30 AM  Radiology DG Chest 2 View  Result Date: 12/19/2020 CLINICAL DATA:  Chest pain, body aches and fever over the last week EXAM: CHEST - 2 VIEW COMPARISON:  01/04/2018 FINDINGS: Heart size is normal. There is a hiatal hernia. The lungs are clear. No infiltrate, collapse or effusion. No abnormal bone finding. IMPRESSION: No active cardiopulmonary disease.  Hiatal hernia. Electronically Signed    By: Nelson Chimes M.D.   On: 12/19/2020 11:04    Procedures .Critical Care Performed by: Lorelle Gibbs, DO Authorized by: Lorelle Gibbs, DO   Critical care provider statement:    Critical care time (minutes):  30   Critical care time was exclusive of:  Separately billable procedures and treating other patients   Critical care was necessary to treat or prevent imminent or life-threatening deterioration of the following conditions:  Renal failure   Critical care was time spent personally by me on the following activities:  Development of treatment plan with patient or surrogate, discussions with consultants, evaluation of patient's response to treatment, examination of patient, ordering and review of laboratory studies, ordering and review of radiographic studies, ordering and performing treatments and interventions, pulse oximetry, re-evaluation of patient's condition and review of old charts   I assumed direction of critical care for this patient from another provider in my specialty: no     Care discussed with: admitting provider     Medications Ordered in ED Medications  sodium chloride 0.9 % bolus 1,000 mL (1,000 mLs Intravenous New Bag/Given 12/19/20 1149)    ED Course  I have reviewed the triage vital signs and the nursing notes.  Pertinent labs & imaging results that were available during my care of the patient were reviewed by me and considered in my medical decision making (see chart for details).    MDM Rules/Calculators/A&P                           55 year old female presents emergency department with epigastric abdominal pain, nausea/vomiting.  Was hypotensive and tachycardic in triage, resolved with small amount of fluids.  Blood work shows a new renal failure with BUN in the 70s and a creatinine over 9.  Electrolytes are reassuring.  Remainder of her work-up from a septic standpoint is normal.  Ultrasound of the abdomen shows no gallbladder pathology, LFT/bilirubin  is normal.  Renal ultrasound shows no acute abnormalities.  Patient's been given IV fluids, was just able to give urinalysis, will send for UA.  Patient will require admission for new renal failure.  Of note in the past week patient has been put on a Medrol Dosepak, inhaler, Z-Pak and cough syrup.  Briefly reviewed her home meds/new meds and do not see a specific nephrotoxic agent.  Patients evaluation and results requires admission for further treatment and care. Patient agrees with admission plan, offers no new complaints and is stable/unchanged at time of admit.  Final Clinical Impression(s) / ED Diagnoses Final diagnoses:  Epigastric pain    Rx / DC Orders ED Discharge Orders     None        Lorelle Gibbs, DO 12/19/20 1513    Lorelle Gibbs, DO 12/19/20 1516

## 2020-12-19 NOTE — ED Triage Notes (Signed)
Patient complains of chest pain and weakness that started several days ago. Patient also states she thinks both of her knees need aspiration. Patient hypotensive in triage but alert, oriented, and in no apparent distress at this time.

## 2020-12-20 DIAGNOSIS — N179 Acute kidney failure, unspecified: Secondary | ICD-10-CM

## 2020-12-20 DIAGNOSIS — M10062 Idiopathic gout, left knee: Secondary | ICD-10-CM

## 2020-12-20 DIAGNOSIS — I959 Hypotension, unspecified: Secondary | ICD-10-CM | POA: Diagnosis present

## 2020-12-20 DIAGNOSIS — D649 Anemia, unspecified: Secondary | ICD-10-CM | POA: Diagnosis present

## 2020-12-20 DIAGNOSIS — E279 Disorder of adrenal gland, unspecified: Secondary | ICD-10-CM | POA: Diagnosis present

## 2020-12-20 DIAGNOSIS — I701 Atherosclerosis of renal artery: Secondary | ICD-10-CM | POA: Diagnosis present

## 2020-12-20 DIAGNOSIS — E861 Hypovolemia: Secondary | ICD-10-CM | POA: Diagnosis present

## 2020-12-20 DIAGNOSIS — F32A Depression, unspecified: Secondary | ICD-10-CM | POA: Diagnosis present

## 2020-12-20 DIAGNOSIS — M659 Synovitis and tenosynovitis, unspecified: Secondary | ICD-10-CM | POA: Diagnosis not present

## 2020-12-20 DIAGNOSIS — M109 Gout, unspecified: Secondary | ICD-10-CM | POA: Diagnosis present

## 2020-12-20 DIAGNOSIS — E872 Acidosis, unspecified: Secondary | ICD-10-CM | POA: Diagnosis present

## 2020-12-20 DIAGNOSIS — F17210 Nicotine dependence, cigarettes, uncomplicated: Secondary | ICD-10-CM | POA: Diagnosis present

## 2020-12-20 DIAGNOSIS — E860 Dehydration: Secondary | ICD-10-CM | POA: Diagnosis present

## 2020-12-20 DIAGNOSIS — E871 Hypo-osmolality and hyponatremia: Secondary | ICD-10-CM | POA: Diagnosis present

## 2020-12-20 DIAGNOSIS — D72829 Elevated white blood cell count, unspecified: Secondary | ICD-10-CM

## 2020-12-20 DIAGNOSIS — I10 Essential (primary) hypertension: Secondary | ICD-10-CM | POA: Diagnosis present

## 2020-12-20 DIAGNOSIS — E8729 Other acidosis: Secondary | ICD-10-CM | POA: Diagnosis not present

## 2020-12-20 DIAGNOSIS — D696 Thrombocytopenia, unspecified: Secondary | ICD-10-CM | POA: Diagnosis present

## 2020-12-20 DIAGNOSIS — E785 Hyperlipidemia, unspecified: Secondary | ICD-10-CM | POA: Diagnosis present

## 2020-12-20 DIAGNOSIS — F419 Anxiety disorder, unspecified: Secondary | ICD-10-CM | POA: Diagnosis present

## 2020-12-20 DIAGNOSIS — K760 Fatty (change of) liver, not elsewhere classified: Secondary | ICD-10-CM

## 2020-12-20 DIAGNOSIS — G4700 Insomnia, unspecified: Secondary | ICD-10-CM | POA: Diagnosis present

## 2020-12-20 DIAGNOSIS — N170 Acute kidney failure with tubular necrosis: Secondary | ICD-10-CM | POA: Diagnosis present

## 2020-12-20 DIAGNOSIS — I2510 Atherosclerotic heart disease of native coronary artery without angina pectoris: Secondary | ICD-10-CM | POA: Diagnosis present

## 2020-12-20 DIAGNOSIS — E119 Type 2 diabetes mellitus without complications: Secondary | ICD-10-CM | POA: Diagnosis present

## 2020-12-20 DIAGNOSIS — K219 Gastro-esophageal reflux disease without esophagitis: Secondary | ICD-10-CM | POA: Diagnosis present

## 2020-12-20 DIAGNOSIS — E875 Hyperkalemia: Secondary | ICD-10-CM | POA: Diagnosis present

## 2020-12-20 DIAGNOSIS — Z20822 Contact with and (suspected) exposure to covid-19: Secondary | ICD-10-CM | POA: Diagnosis present

## 2020-12-20 HISTORY — DX: Acute kidney failure, unspecified: N17.9

## 2020-12-20 LAB — SYNOVIAL CELL COUNT + DIFF, W/ CRYSTALS
Eosinophils-Synovial: 0 % (ref 0–1)
Eosinophils-Synovial: 0 % (ref 0–1)
Lymphocytes-Synovial Fld: 0 % (ref 0–20)
Lymphocytes-Synovial Fld: 1 % (ref 0–20)
Monocyte-Macrophage-Synovial Fluid: 1 % — ABNORMAL LOW (ref 50–90)
Monocyte-Macrophage-Synovial Fluid: 4 % — ABNORMAL LOW (ref 50–90)
Neutrophil, Synovial: 95 % — ABNORMAL HIGH (ref 0–25)
Neutrophil, Synovial: 99 % — ABNORMAL HIGH (ref 0–25)
WBC, Synovial: 29056 /mm3 — ABNORMAL HIGH (ref 0–200)
WBC, Synovial: 35389 /mm3 — ABNORMAL HIGH (ref 0–200)

## 2020-12-20 LAB — COMPREHENSIVE METABOLIC PANEL
ALT: 32 U/L (ref 0–44)
AST: 43 U/L — ABNORMAL HIGH (ref 15–41)
Albumin: 3 g/dL — ABNORMAL LOW (ref 3.5–5.0)
Alkaline Phosphatase: 78 U/L (ref 38–126)
Anion gap: 12 (ref 5–15)
BUN: 74 mg/dL — ABNORMAL HIGH (ref 6–20)
CO2: 19 mmol/L — ABNORMAL LOW (ref 22–32)
Calcium: 7.7 mg/dL — ABNORMAL LOW (ref 8.9–10.3)
Chloride: 105 mmol/L (ref 98–111)
Creatinine, Ser: 7.69 mg/dL — ABNORMAL HIGH (ref 0.44–1.00)
GFR, Estimated: 6 mL/min — ABNORMAL LOW (ref >60)
Glucose, Bld: 46 mg/dL — ABNORMAL LOW (ref 70–99)
Potassium: 4.6 mmol/L (ref 3.5–5.1)
Sodium: 136 mmol/L (ref 135–145)
Total Bilirubin: 0.5 mg/dL (ref 0.3–1.2)
Total Protein: 6.8 g/dL (ref 6.5–8.1)

## 2020-12-20 LAB — CBC
HCT: 29.2 % — ABNORMAL LOW (ref 36.0–46.0)
Hemoglobin: 9.2 g/dL — ABNORMAL LOW (ref 12.0–15.0)
MCH: 22.7 pg — ABNORMAL LOW (ref 26.0–34.0)
MCHC: 31.5 g/dL (ref 30.0–36.0)
MCV: 72.1 fL — ABNORMAL LOW (ref 80.0–100.0)
Platelets: 381 10*3/uL (ref 150–400)
RBC: 4.05 MIL/uL (ref 3.87–5.11)
RDW: 21.6 % — ABNORMAL HIGH (ref 11.5–15.5)
WBC: 10.4 10*3/uL (ref 4.0–10.5)
nRBC: 0 % (ref 0.0–0.2)

## 2020-12-20 LAB — IRON AND TIBC
Iron: 30 ug/dL (ref 28–170)
Saturation Ratios: 6 % — ABNORMAL LOW (ref 10.4–31.8)
TIBC: 480 ug/dL — ABNORMAL HIGH (ref 250–450)
UIBC: 450 ug/dL

## 2020-12-20 LAB — GLUCOSE, CAPILLARY
Glucose-Capillary: 86 mg/dL (ref 70–99)
Glucose-Capillary: 108 mg/dL — ABNORMAL HIGH (ref 70–99)
Glucose-Capillary: 312 mg/dL — ABNORMAL HIGH (ref 70–99)
Glucose-Capillary: 183 mg/dL — ABNORMAL HIGH (ref 70–99)

## 2020-12-20 LAB — FERRITIN: Ferritin: 44 ng/mL (ref 11–307)

## 2020-12-20 LAB — FOLATE: Folate: 21.2 ng/mL (ref >5.9)

## 2020-12-20 LAB — VITAMIN B12: Vitamin B-12: 708 pg/mL (ref 180–914)

## 2020-12-20 LAB — HIV ANTIBODY (ROUTINE TESTING W REFLEX): HIV Screen 4th Generation wRfx: NONREACTIVE

## 2020-12-20 MED ORDER — PREDNISONE 50 MG PO TABS
60.0000 mg | ORAL_TABLET | Freq: Every day | ORAL | Status: DC
  Administered 2020-12-20 – 2020-12-22 (×3): 60 mg via ORAL
  Filled 2020-12-20 (×3): qty 1

## 2020-12-20 MED ORDER — PREDNISONE 20 MG PO TABS: 40.0000 mg | ORAL_TABLET | Freq: Every day | ORAL | Status: DC

## 2020-12-20 MED ORDER — BUPIVACAINE HCL (PF) 0.5 % IJ SOLN
20.0000 mL | Freq: Once | INTRAMUSCULAR | Status: AC
  Administered 2020-12-20: 20 mL
  Filled 2020-12-20: qty 20

## 2020-12-20 MED ORDER — METHYLPREDNISOLONE ACETATE 40 MG/ML IJ SUSP
80.0000 mg | Freq: Once | INTRAMUSCULAR | Status: AC
  Administered 2020-12-20: 80 mg via INTRA_ARTICULAR
  Filled 2020-12-20: qty 2

## 2020-12-20 NOTE — Procedures (Signed)
Procedure: Bilateral knee aspiration and injection   Indication: Bilateral knee effusion(s)   Surgeon: Silvestre Gunner, PA-C   Assist: None   Anesthesia: Topical refrigerant   EBL: None   Complications: None   Findings: After risks/benefits explained patient desires to undergo procedure. Consent obtained and time out performed. The bilateral knees were sterilely prepped and aspirated. 45ml pale foamy yellow fluid obtained from the right knee and 50ml similar fluid obtained from the left. 40mg  depo-medrol and 64ml 0.5% Marcaine instilled in each. Pt tolerated the procedure well.       Lisette Abu, PA-C Orthopedic Surgery [phone]

## 2020-12-20 NOTE — Progress Notes (Signed)
[facility] NEPHROLOGY PROGRESS NOTE  Assessment/ Plan:  Patient is a 55 y.o. yo female with hypertension, type 2 diabetes mellitus, left renal artery stenosis, non-obstructive coronary artery disease, tobacco use disorder admitted 12/1 for acute renal failure   #Acute non-oliguric renal failure #Anion-gap metabolic acidosis - Renal function mildly improved today, Cr 7.7<9.5, GFR 6<4. Anion gap has significantly improved, 12<19. - Vitals have been stable and patient appears euvolemic on exam - No indication for dialysis at this time  - Most likely renal failure 2/2 ATN d/t dehydration/medication-induced - Antibiotics were initiated yesterday for pyuria on urinalysis. Patient is not having any current symptoms. Of note, urine culture was not obtained prior to antibiotics.  - Renal artery ultrasound unchanged from prior, 1-59% stenosis of LRA.  - Continue with lactated ringer infusion, can consider switching to bicarb if continues to worsen - Continue to hold all nephrotoxic medications including ARB, diuretics, NSAIDs - Strict I/O, daily weights  #Bilateral knee pain - Most likely represents gout, uric acid elevated to 15. - Consider switching from Norco to dilaudid given severe renal impairment - Would avoid NSAID, colchicine  #Hypertension - BP appears stable, continue holding home antihypertensives  #Type II diabetes mellitus - Management per primary  Subjective:    Patient evaluated at bedside this AM. She states she is doing well this morning, no acute complaints. She does continue to feel thirsty, although improved from yesterday. Ms. Karger does mention bilateral knee pain, but this has been controlled thus far.   Objective Vital signs in last 24 hours: Vitals:   12/19/20 1845 12/19/20 1939 12/19/20 2123 12/20/20 0555  BP: (!) 112/55 123/76  107/73  Pulse: 95 93  78  Resp: 20 18  18   Temp:  97.8 F (36.6 C)  97.7 F (36.5 C)  TempSrc:  Oral  Oral   SpO2: 100% 100%  98%  Weight:   66 kg   Height:   5\' 3"  (1.6 m)    Weight change:   Intake/Output Summary (Last 24 hours) at 12/20/2020 0849 Last data filed at 12/20/2020 0841 Gross per 24 hour  Intake 3096.39 ml  Output 850 ml  Net 2246.39 ml   Labs: Basic Metabolic Panel: Recent Labs  Lab 12/19/20 1100 12/19/20 1631 12/20/20 0103  NA 132*  --  136  K 4.9  --  4.6  CL 93*  --  105  CO2 20*  --  19*  GLUCOSE 84  --  46*  BUN 77*  --  74*  CREATININE 9.50*  --  7.69*  CALCIUM 8.7*  --  7.7*  PHOS  --  5.6*  --    Liver Function Tests: Recent Labs  Lab 12/19/20 1100 12/20/20 0103  AST 64* 43*  ALT 40 32  ALKPHOS 88 78  BILITOT 0.8 0.5  PROT 8.3* 6.8  ALBUMIN 3.7 3.0*   Recent Labs  Lab 12/19/20 1100  LIPASE 62*   No results for input(s): AMMONIA in the last 168 hours. CBC: Recent Labs  Lab 12/19/20 1100 12/20/20 0103  WBC 12.4* 10.4  NEUTROABS 10.1*  --   HGB 10.7* 9.2*  HCT 34.4* 29.2*  MCV 73.3* 72.1*  PLT 482* 381   Cardiac Enzymes: No results for input(s): CKTOTAL, CKMB, CKMBINDEX, TROPONINI in the last 168 hours. CBG: Recent Labs  Lab 12/19/20 1840 12/19/20 1944 12/20/20 0601  GLUCAP 48* 100* 86    Iron Studies:  Recent Labs    12/20/20 0103  IRON 30  TIBC 480*  FERRITIN 44   Studies/Results: DG Chest 2 View  Result Date: 12/19/2020 CLINICAL DATA:  Chest pain, body aches and fever over the last week EXAM: CHEST - 2 VIEW COMPARISON:  01/04/2018 FINDINGS: Heart size is normal. There is a hiatal hernia. The lungs are clear. No infiltrate, collapse or effusion. No abnormal bone finding. IMPRESSION: No active cardiopulmonary disease.  Hiatal hernia. Electronically Signed   By: Nelson Chimes M.D.   On: 12/19/2020 11:04   US Abdomen Complete  Result Date: 12/19/2020 CLINICAL DATA:  Right upper quadrant pain EXAM: ABDOMEN ULTRASOUND COMPLETE COMPARISON:  None. FINDINGS: Gallbladder: No gallstones or wall thickening visualized. No  sonographic Murphy sign noted by sonographer. Common bile duct: Diameter: 5.6 Liver: Focal fatty sparing seen near the gallbladder fossa. No suspicious focal lesion identified. Increased parenchymal echogenicity. Portal vein is patent on color Doppler imaging with normal direction of blood flow towards the liver. IVC: No abnormality visualized. Pancreas: Visualized portion unremarkable. Spleen: Size and appearance within normal limits. Right Kidney: Length: 9.8 cm. Echogenicity within normal limits. No mass or hydronephrosis visualized. Simple cyst of the right kidney measuring 0.8 x 0.6 x 0.6 cm. Left Kidney: Length: 10.8 cm. Echogenicity within normal limits. No mass or hydronephrosis visualized. Abdominal aorta: No aneurysm visualized. Other findings: Solid nodule of the right upper quadrant measuring 3.3 x 2.8 x 2.9 cm which correlates with right adrenal gland nodule seen on prior CT, previously characterized as benign adenoma. IMPRESSION: 1. No sonographic findings to explain right upper quadrant pain. 2. Hepatic steatosis. Electronically Signed   By: Yetta Glassman M.D.   On: 12/19/2020 14:22   VAS US RENAL ARTERY DUPLEX  Result Date: 12/19/2020 ABDOMINAL VISCERAL Patient Name:  NIARA BUNKER Racey  Date of Exam:   12/19/2020 Medical Rec #: [id number]        Accession #:    [id number] Date of Birth: 05/29/1965         Patient Gender: F Patient Age:   7 years Exam Location:  [facility] Procedure:      VAS US RENAL ARTERY DUPLEX Referring Phys: [id number] Orma Flaming -------------------------------------------------------------------------------- Indications: Acute renal failure High Risk Factors: Hypertension, hyperlipidemia, Diabetes, current smoker. Limitations: Body habitus. Comparison Study: Previous exam on 07/05/2019 showed a 1-59% stenosis in LRA Performing Technologist: Rogelia Rohrer RVT, RDMS  Examination Guidelines: A complete evaluation includes B-mode imaging, spectral Doppler, color Doppler, and  power Doppler as needed of all accessible portions of each vessel. Bilateral testing is considered an integral part of a complete examination. Limited examinations for reoccurring indications may be performed as noted.  Duplex Findings: +--------------------+--------+--------+------+--------+ Mesenteric          PSV cm/sEDV cm/sPlaqueComments +--------------------+--------+--------+------+--------+ Aorta Prox             63      19                  +--------------------+--------+--------+------+--------+ Celiac Artery Origin  127                          +--------------------+--------+--------+------+--------+ SMA Proximal          169      39                  +--------------------+--------+--------+------+--------+    +------------------+--------+--------+-------+ Right Renal ArteryPSV cm/sEDV cm/sComment +------------------+--------+--------+-------+ Origin               89  26           +------------------+--------+--------+-------+ Proximal            123      34           +------------------+--------+--------+-------+ Mid                  86      21           +------------------+--------+--------+-------+ Distal              111      28           +------------------+--------+--------+-------+ +-----------------+--------+--------+-------+ Left Renal ArteryPSV cm/sEDV cm/sComment +-----------------+--------+--------+-------+ Origin              85      26           +-----------------+--------+--------+-------+ Proximal           132      38           +-----------------+--------+--------+-------+ Mid                169      44           +-----------------+--------+--------+-------+ Distal             150      38           +-----------------+--------+--------+-------+ +------------+--------+--------+----+-----------+--------+--------+----+ Right KidneyPSV cm/sEDV cm/sRI  Left KidneyPSV cm/sEDV cm/sRI    +------------+--------+--------+----+-----------+--------+--------+----+ Upper Pole  31      8       0.73Upper Pole 50      15      0.71 +------------+--------+--------+----+-----------+--------+--------+----+ Mid         30      9       0.71Mid        27      8       0.69 +------------+--------+--------+----+-----------+--------+--------+----+ Lower Pole  18      4       0.76Lower Pole 25      9       0.65 +------------+--------+--------+----+-----------+--------+--------+----+ Hilar       30      10      0.66Hilar      52      16      0.69 +------------+--------+--------+----+-----------+--------+--------+----+ +------------------+-----+------------------+-----+ Right Kidney           Left Kidney             +------------------+-----+------------------+-----+ RAR                    RAR                     +------------------+-----+------------------+-----+ RAR (manual)      1.95 RAR (manual)      2.68  +------------------+-----+------------------+-----+ Cortex            0.55 Cortex            0.52  +------------------+-----+------------------+-----+ Cortex thickness       Corex thickness         +------------------+-----+------------------+-----+ Kidney length (cm)10.96Kidney length (cm)10.60 +------------------+-----+------------------+-----+   Summary: Renal:  Right: Normal size right kidney. Normal right Resisitive Index. No        evidence of right renal artery stenosis. RRV flow present. Left:  Normal size of left kidney. Normal left Resistive Index.        1-59% stenosis of the  left renal artery. LRV flow present. Mesenteric: Normal Celiac artery findings.  *See table(s) above for measurements and observations.     Preliminary     Medications: Infusions:  cefTRIAXone (ROCEPHIN)  IV Stopped (12/19/20 1952)   lactated ringers 100 mL/hr at 12/20/20 0436    Scheduled Medications:  heparin  5,000 Units Subcutaneous Q8H   insulin aspart   0-9 Units Subcutaneous TID WC   lidocaine  1 patch Transdermal Q24H   mirtazapine  7.5 mg Oral QHS   nicotine  21 mg Transdermal Daily   pantoprazole  40 mg Oral Daily    have reviewed scheduled and prn medications.  Physical Exam: General: Resting comfortably in no acute distress Heart: Regular rate, rhythm. No murmurs appreciated. Lungs: Normal work of breathing on room air. Clear to ausculation.  Abdomen: Soft, non-tender, non-distended. Extremities: No edema bilaterally.  Sanjuan Dame, MD Internal Medicine Resident PGY-2 12/20/2020,8:49 AM  LOS: 0 days

## 2020-12-20 NOTE — Progress Notes (Signed)
Patient ID: Stacy [name], female   DOB: 1965-11-04, 55 y.o.   MRN: [id number]  PROGRESS NOTE    OPHA MCGHEE  EUM:[id number] DOB: 02/24/1965 DOA: 12/19/2020 PCP: Pcp, No   Brief Narrative:  55 y.o. female with medical history significant of HTN, HLD, GERD, T2DM, depression with anxiety, gout, nonobstructive CAD, left renal artery stenosis, tobacco abuse treated with abdominal pain, nausea and vomiting and not feeling well along with bilateral knee pain.  On presentation, she was slightly hypotensive, tachycardic.  WBC of 12.4; BUN of 77, creatinine of 9.5, lipase of 62.  Chest x-ray showed no acute findings.  Ultrasound of abdomen showed no acute findings to explain her abdominal pain.  She was started on IV fluids.  Nephrology was consulted.  Assessment & Plan:   Acute kidney injury/acute renal failure Anion gap metabolic acidosis -Possibly from ATN from dehydration and hypotension -Presented with creatinine of 9.5; creatinine improving to 7.69 today.  Patient making urine.  Renal ultrasound negative for hydronephrosis -nephrology following.  IV fluids as per nephrology. -Monitor creatinine.  Strict input and output.  Daily weights.  Avoid nephrotoxic medications.  Currently making urine.  Possible UTI -Present on admission.  Follow urine cultures.  Continue Rocephin.  Bilateral knee pain and swelling/?  Acute gout flare -Patient states that she has had similar symptoms in the past requiring arthrocentesis which was consistent with acute gout.  Patient is requesting orthopedics evaluation for possible arthrocentesis.  Consult orthopedics.  We will start oral prednisone.  Avoid colchicine because of recent nausea and vomiting  Nausea/vomiting -Questionable cause.  Ultrasound abdomen was negative for acute cause/gallbladder disease. -Use antiemetics as needed.  Advance diet as tolerated  Right upper quadrant pain/shoulder pain -Might be musculoskeletal as well.  Lipase only mildly  elevated.  Troponin negative.  Continue current pain management.  Leukocytosis -Possibly reactive.  Resolved  Renal artery stenosis -Renal duplex showed 1 to 59% stenosis of left renal 3.;  No evidence of right renal artery stenosis.  Normocytic anemia -Questionable cause.  Hemoglobin stable.  Outpatient follow-up  Fatty liver  Mildly elevated AST -Possibly from alcohol use.  Outpatient follow-up with GI.  Abstain from alcohol  Hypertension -Blood pressure on the lower side.  Antihypertensives on hold  Hyperlipidemia -Statin on hold because of slightly elevated LFTs  GERD -Continue Protonix  Depression with anxiety -Stable.  Continue Remeron  Adrenal mass, right -Has outpatient follow-up  Tobacco use -Continue nicotine patch.  Patient was counseled regarding cessation by admitting hospitalist   DVT prophylaxis: Heparin Code Status: Full Family Communication: None at bedside Disposition Plan: Status is: Observation  The patient will require care spanning > 2 midnights and should be moved to inpatient because: Of need for IV fluids/AKI; knee pain Possible arthrocentesis  Consultants: Nephrology/Orthopedics  Procedures: None  Antimicrobials: Rocephin from 12/19/2020 onwards   Subjective: Patient seen and examined at bedside.  Still complains of bilateral knee pain and swelling and is requesting them to be drained.  Making urine.  Denies fever, chest pain or worsening shortness of breath.  Intermittently nauseous  Objective: Vitals:   12/19/20 1845 12/19/20 1939 12/19/20 2123 12/20/20 0555  BP: (!) 112/55 123/76  107/73  Pulse: 95 93  78  Resp: 20 18  18   Temp:  97.8 F (36.6 C)  97.7 F (36.5 C)  TempSrc:  Oral  Oral  SpO2: 100% 100%  98%  Weight:   66 kg   Height:   5\' 3"  (1.6 m)  Intake/Output Summary (Last 24 hours) at 12/20/2020 0903 Last data filed at 12/20/2020 0841 Gross per 24 hour  Intake 3096.39 ml  Output 850 ml  Net 2246.39 ml    Filed Weights   12/19/20 2123  Weight: 66 kg    Examination:  General exam: Appears calm and comfortable.  Currently on room air. Respiratory system: Bilateral decreased breath sounds at bases Cardiovascular system: S1 & S2 heard, Rate controlled Gastrointestinal system: Abdomen is nondistended, soft and mildly tender in the right upper quadrant.  Normal bowel sounds heard. Extremities: Bilateral knees are swollen and mildly tender with decreased range of motion Central nervous system: Alert and oriented. No focal neurological deficits. Moving extremities Skin: No rashes, lesions or ulcers Psychiatry: Judgement and insight appear normal. Mood & affect appropriate.     Data Reviewed: I have personally reviewed following labs and imaging studies  CBC: Recent Labs  Lab 12/19/20 1100 12/20/20 0103  WBC 12.4* 10.4  NEUTROABS 10.1*  --   HGB 10.7* 9.2*  HCT 34.4* 29.2*  MCV 73.3* 72.1*  PLT 482* 854   Basic Metabolic Panel: Recent Labs  Lab 12/19/20 1100 12/19/20 1631 12/20/20 0103  NA 132*  --  136  K 4.9  --  4.6  CL 93*  --  105  CO2 20*  --  19*  GLUCOSE 84  --  46*  BUN 77*  --  74*  CREATININE 9.50*  --  7.69*  CALCIUM 8.7*  --  7.7*  MG  --  1.7  --   PHOS  --  5.6*  --    GFR: Estimated Creatinine Clearance: 7.5 mL/min (A) (by C-G formula based on SCr of 7.69 mg/dL (H)). Liver Function Tests: Recent Labs  Lab 12/19/20 1100 12/20/20 0103  AST 64* 43*  ALT 40 32  ALKPHOS 88 78  BILITOT 0.8 0.5  PROT 8.3* 6.8  ALBUMIN 3.7 3.0*   Recent Labs  Lab 12/19/20 1100  LIPASE 62*   No results for input(s): AMMONIA in the last 168 hours. Coagulation Profile: No results for input(s): INR, PROTIME in the last 168 hours. Cardiac Enzymes: No results for input(s): CKTOTAL, CKMB, CKMBINDEX, TROPONINI in the last 168 hours. BNP (last 3 results) No results for input(s): PROBNP in the last 8760 hours. HbA1C: No results for input(s): HGBA1C in the last 72  hours. CBG: Recent Labs  Lab 12/19/20 1840 12/19/20 1944 12/20/20 0601  GLUCAP 48* 100* 86   Lipid Profile: No results for input(s): CHOL, HDL, LDLCALC, TRIG, CHOLHDL, LDLDIRECT in the last 72 hours. Thyroid Function Tests: No results for input(s): TSH, T4TOTAL, FREET4, T3FREE, THYROIDAB in the last 72 hours. Anemia Panel: Recent Labs    12/20/20 0103  VITAMINB12 708  FOLATE 21.2  FERRITIN 44  TIBC 480*  IRON 30   Sepsis Labs: Recent Labs  Lab 12/19/20 1141 12/19/20 1229  LATICACIDVEN 2.2* 1.8    Recent Results (from the past 240 hour(s))  Resp Panel by RT-PCR (Flu A&B, Covid) Nasopharyngeal Swab     Status: None   Collection Time: 12/19/20  3:14 PM   Specimen: Nasopharyngeal Swab; Nasopharyngeal(NP) swabs in vial transport medium  Result Value Ref Range Status   SARS Coronavirus 2 by RT PCR NEGATIVE NEGATIVE Final    Comment: (NOTE) SARS-CoV-2 target nucleic acids are NOT DETECTED.  The SARS-CoV-2 RNA is generally detectable in upper respiratory specimens during the acute phase of infection. The lowest concentration of SARS-CoV-2 viral copies this assay can detect  is 138 copies/mL. A negative result does not preclude SARS-Cov-2 infection and should not be used as the sole basis for treatment or other patient management decisions. A negative result may occur with  improper specimen collection/handling, submission of specimen other than nasopharyngeal swab, presence of viral mutation(s) within the areas targeted by this assay, and inadequate number of viral copies(<138 copies/mL). A negative result must be combined with clinical observations, patient history, and epidemiological information. The expected result is Negative.  Fact Sheet for Patients:  EntrepreneurPulse.com.au  Fact Sheet for Healthcare Providers:  IncredibleEmployment.be  This test is no t yet approved or cleared by the [place] FDA and  has been  authorized for detection and/or diagnosis of SARS-CoV-2 by FDA under an Emergency Use Authorization (EUA). This EUA will remain  in effect (meaning this test can be used) for the duration of the COVID-19 declaration under Section 564(b)(1) of the Act, 21 U.S.C.section 360bbb-3(b)(1), unless the authorization is terminated  or revoked sooner.       Influenza A by PCR NEGATIVE NEGATIVE Final   Influenza B by PCR NEGATIVE NEGATIVE Final    Comment: (NOTE) The Xpert Xpress SARS-CoV-2/FLU/RSV plus assay is intended as an aid in the diagnosis of influenza from Nasopharyngeal swab specimens and should not be used as a sole basis for treatment. Nasal washings and aspirates are unacceptable for Xpert Xpress SARS-CoV-2/FLU/RSV testing.  Fact Sheet for Patients: EntrepreneurPulse.com.au  Fact Sheet for Healthcare Providers: IncredibleEmployment.be  This test is not yet approved or cleared by the [place] FDA and has been authorized for detection and/or diagnosis of SARS-CoV-2 by FDA under an Emergency Use Authorization (EUA). This EUA will remain in effect (meaning this test can be used) for the duration of the COVID-19 declaration under Section 564(b)(1) of the Act, 21 U.S.C. section 360bbb-3(b)(1), unless the authorization is terminated or revoked.  Performed at [facility], [place] [address] [place], [place] [address]          Radiology Studies: DG Chest 2 View  Result Date: 12/19/2020 CLINICAL DATA:  Chest pain, body aches and fever over the last week EXAM: CHEST - 2 VIEW COMPARISON:  01/04/2018 FINDINGS: Heart size is normal. There is a hiatal hernia. The lungs are clear. No infiltrate, collapse or effusion. No abnormal bone finding. IMPRESSION: No active cardiopulmonary disease.  Hiatal hernia. Electronically Signed   By: Nelson Chimes M.D.   On: 12/19/2020 11:04   US Abdomen Complete  Result Date: 12/19/2020 CLINICAL DATA:  Right  upper quadrant pain EXAM: ABDOMEN ULTRASOUND COMPLETE COMPARISON:  None. FINDINGS: Gallbladder: No gallstones or wall thickening visualized. No sonographic Murphy sign noted by sonographer. Common bile duct: Diameter: 5.6 Liver: Focal fatty sparing seen near the gallbladder fossa. No suspicious focal lesion identified. Increased parenchymal echogenicity. Portal vein is patent on color Doppler imaging with normal direction of blood flow towards the liver. IVC: No abnormality visualized. Pancreas: Visualized portion unremarkable. Spleen: Size and appearance within normal limits. Right Kidney: Length: 9.8 cm. Echogenicity within normal limits. No mass or hydronephrosis visualized. Simple cyst of the right kidney measuring 0.8 x 0.6 x 0.6 cm. Left Kidney: Length: 10.8 cm. Echogenicity within normal limits. No mass or hydronephrosis visualized. Abdominal aorta: No aneurysm visualized. Other findings: Solid nodule of the right upper quadrant measuring 3.3 x 2.8 x 2.9 cm which correlates with right adrenal gland nodule seen on prior CT, previously characterized as benign adenoma. IMPRESSION: 1. No sonographic findings to explain right upper quadrant pain.  2. Hepatic steatosis. Electronically Signed   By: Yetta Glassman M.D.   On: 12/19/2020 14:22   VAS US RENAL ARTERY DUPLEX  Result Date: 12/19/2020 ABDOMINAL VISCERAL Patient Name:  LESHONDA GALAMBOS Saal  Date of Exam:   12/19/2020 Medical Rec #: [id number]        Accession #:    [id number] Date of Birth: 09-17-1965         Patient Gender: F Patient Age:   76 years Exam Location:  [facility] Procedure:      VAS US RENAL ARTERY DUPLEX Referring Phys: [id number] Orma Flaming -------------------------------------------------------------------------------- Indications: Acute renal failure High Risk Factors: Hypertension, hyperlipidemia, Diabetes, current smoker. Limitations: Body habitus. Comparison Study: Previous exam on 07/05/2019 showed a 1-59% stenosis in LRA Performing  Technologist: Rogelia Rohrer RVT, RDMS  Examination Guidelines: A complete evaluation includes B-mode imaging, spectral Doppler, color Doppler, and power Doppler as needed of all accessible portions of each vessel. Bilateral testing is considered an integral part of a complete examination. Limited examinations for reoccurring indications may be performed as noted.  Duplex Findings: +--------------------+--------+--------+------+--------+ Mesenteric          PSV cm/sEDV cm/sPlaqueComments +--------------------+--------+--------+------+--------+ Aorta Prox             63      19                  +--------------------+--------+--------+------+--------+ Celiac Artery Origin  127                          +--------------------+--------+--------+------+--------+ SMA Proximal          169      39                  +--------------------+--------+--------+------+--------+    +------------------+--------+--------+-------+ Right Renal ArteryPSV cm/sEDV cm/sComment +------------------+--------+--------+-------+ Origin               89      26           +------------------+--------+--------+-------+ Proximal            123      34           +------------------+--------+--------+-------+ Mid                  86      21           +------------------+--------+--------+-------+ Distal              111      28           +------------------+--------+--------+-------+ +-----------------+--------+--------+-------+ Left Renal ArteryPSV cm/sEDV cm/sComment +-----------------+--------+--------+-------+ Origin              85      26           +-----------------+--------+--------+-------+ Proximal           132      38           +-----------------+--------+--------+-------+ Mid                169      44           +-----------------+--------+--------+-------+ Distal             150      38           +-----------------+--------+--------+-------+  +------------+--------+--------+----+-----------+--------+--------+----+ Right KidneyPSV cm/sEDV cm/sRI  Left KidneyPSV cm/sEDV cm/sRI   +------------+--------+--------+----+-----------+--------+--------+----+ Upper Pole  31  8       0.73Upper Pole 50      15      0.71 +------------+--------+--------+----+-----------+--------+--------+----+ Mid         30      9       0.71Mid        27      8       0.69 +------------+--------+--------+----+-----------+--------+--------+----+ Lower Pole  18      4       0.76Lower Pole 25      9       0.65 +------------+--------+--------+----+-----------+--------+--------+----+ Hilar       30      10      0.66Hilar      52      16      0.69 +------------+--------+--------+----+-----------+--------+--------+----+ +------------------+-----+------------------+-----+ Right Kidney           Left Kidney             +------------------+-----+------------------+-----+ RAR                    RAR                     +------------------+-----+------------------+-----+ RAR (manual)      1.95 RAR (manual)      2.68  +------------------+-----+------------------+-----+ Cortex            0.55 Cortex            0.52  +------------------+-----+------------------+-----+ Cortex thickness       Corex thickness         +------------------+-----+------------------+-----+ Kidney length (cm)10.96Kidney length (cm)10.60 +------------------+-----+------------------+-----+   Summary: Renal:  Right: Normal size right kidney. Normal right Resisitive Index. No        evidence of right renal artery stenosis. RRV flow present. Left:  Normal size of left kidney. Normal left Resistive Index.        1-59% stenosis of the left renal artery. LRV flow present. Mesenteric: Normal Celiac artery findings.  *See table(s) above for measurements and observations.     Preliminary         Scheduled Meds:  heparin  5,000 Units Subcutaneous Q8H    insulin aspart  0-9 Units Subcutaneous TID WC   lidocaine  1 patch Transdermal Q24H   mirtazapine  7.5 mg Oral QHS   nicotine  21 mg Transdermal Daily   pantoprazole  40 mg Oral Daily   Continuous Infusions:  cefTRIAXone (ROCEPHIN)  IV Stopped (12/19/20 1952)   lactated ringers 100 mL/hr at 12/20/20 0436          Aline August, MD [facility] 12/20/2020, 9:03 AM

## 2020-12-20 NOTE — TOC Initial Note (Signed)
Transition of Care [facility]) - Initial/Assessment Note    Patient Details  Name: Stacy [name] MRN: [id number] Date of Birth: [birth year]  [facility]) CM/SW Contact:    Tom-Johnson, Renea Ee, RN Phone Number: 12/20/2020, 4:19 PM  Clinical Narrative:                 CM spoke with patient about needs for post hospitalization transition. Patient presented to the ED with n/v, RUQ pain and bilateral knees pain. Patient found to be in AKF. Patient states she lives alone, has two sons whom are supportive. Her father is living and also supportive. 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 Independent with care and drives self prior to hospitalization. Does not have DME's. Does not currently have a PCP. States her PCP at [facility] left and went to do Bariatric Care. CM scheduled hospital followup visit with [facility]. Information on AVS. [facility] [address]. Medical workup continues. CM will continue to follow with needs.        Barriers to Discharge: Continued Medical Work up   Patient Goals and CMS Choice Patient states their goals for this hospitalization and ongoing recovery are:: To go home CMS Medicare.gov Compare Post Acute Care list provided to:: Patient    Expected Discharge Plan and Services     Discharge Planning Services: CM Consult   Living arrangements for the past 2 months: Single Family Home                                      Prior Living Arrangements/Services Living arrangements for the past 2 months: Single Family Home Lives with:: Self Patient language and need for interpreter reviewed:: Yes Do you feel safe going back to the place where you live?: Yes      Need for Family Participation in Patient Care: Yes (Comment) Care giver support system in place?: Yes (comment)   Criminal Activity/Legal Involvement Pertinent to Current Situation/Hospitalization: No -  Comment as needed  Activities of Daily Living Home Assistive Devices/Equipment: None ADL Screening (condition at time of admission) Patient's cognitive ability adequate to safely complete daily activities?: Yes Is the patient deaf or have difficulty hearing?: No Does the patient have difficulty seeing, even when wearing glasses/contacts?: No Does the patient have difficulty concentrating, remembering, or making decisions?: No Patient able to express need for assistance with ADLs?: No Does the patient have difficulty dressing or bathing?: No Independently performs ADLs?: Yes (appropriate for developmental age) Does the patient have difficulty walking or climbing stairs?: Yes Weakness of Legs: Both Weakness of Arms/Hands: None  Permission Sought/Granted Permission sought to share information with : Case Manager, Family Supports Permission granted to share information with : Yes, Verbal Permission Granted              Emotional Assessment Appearance:: Appears stated age Attitude/Demeanor/Rapport: Engaged Affect (typically observed): Accepting, Appropriate, Calm, Hopeful Orientation: : Oriented to Self, Oriented to Place, Oriented to  Time, Oriented to Situation Alcohol / Substance Use: Not Applicable Psych Involvement: No (comment)  Admission diagnosis:  Epigastric pain [R10.13] Acute renal failure (HCC) [N17.9] Renal failure, unspecified chronicity [N19] AKI (acute kidney injury) ([place]) [N17.9] Patient Active Problem List   Diagnosis Date Noted   AKI (acute kidney injury) ([place]) 12/20/2020   Acute renal failure ([place]) 12/19/2020   Leukocytosis 12/19/2020   Increased  anion gap metabolic acidosis 94/50/3888   Anemia 12/19/2020   Renal artery stenosis (HCC) 08/02/2019   Acute bronchitis 08/02/2019   Adenoma of right adrenal gland 01/16/2019   Coronary artery calcification seen on CAT scan 04/12/2018   Insomnia 01/01/2015   Fatty liver 01/01/2015   Adrenal mass, right Dr Erskine Squibb 03/13/2014   Thyroid nodule  managed Dr. Posey Pronto 02/27/2014   EtOH dependence ([place]) 11/22/2013   Bereavement 10/04/2013   Tobacco use 10/04/2013   MDD (major depressive disorder) 08/23/2013   Colon polyp 08/23/2013   Migraines 08/23/2013   GERD (gastroesophageal reflux disease)    Depression with anxiety    Type 2 diabetes mellitus with other specified complication ([place])    Gout    Hypertension    Hyperlipemia    PCP:  Pcp, No Pharmacy:   [id number] [place] - [address] [place] [address] Phone: [phone] Fax: [phone]  [id number] [place], [place] - [address] [place] [address] Phone: [phone] Fax: [phone]     Social Determinants of Health (SDOH) Interventions    Readmission Risk Interventions No flowsheet data found.

## 2020-12-20 NOTE — Consult Note (Signed)
Reason for Consult:Bilateral knee pain Referring Physician: Aline August Time called: 0902 Time at bedside: [place] is an 55 y.o. female.  HPI: Stacy [name] was admitted yesterday with acute renal failure. She reports severe bilateral knee pain since Tuesday. This feels exactly like gout flares she has had in the past. She admits to difficulty ambulating. She had an arthrocentesis about 7y ago which helped a lot and she would like that again.  Past Medical History:  Diagnosis Date   Acute bronchitis 08/02/2019   Adrenal adenoma    as per MRI 03/09/14   Allergy    Anxiety    Coronary artery calcification seen on CAT scan 04/12/2018   Depression    Diabetes mellitus    Diverticulosis    Elevated LFTs    elevated since 09/2013   Fatty liver    moderate- as per 03/06/17 MRI liver   GERD (gastroesophageal reflux disease)    Gout    H/O hiatal hernia    Headache(784.0)    History of depression    Hyperlipemia    Hyperplastic colon polyp    Hypertension    Positive hepatitis C antibody test    RNA NEGATIVE   Renal artery stenosis ([place]) 08/02/2019   06/2019: 1-59% stenosis on L    Thyroid nodule  managed Dr. Posey Pronto 02/27/2014   Tobacco abuse    Tubular adenoma of colon     Past Surgical History:  Procedure Laterality Date   ANAL FISTULECTOMY  01/19/1985   BREAST EXCISIONAL BIOPSY Left    BREAST LUMPECTOMY Left 1988   PAROTIDECTOMY  03/30/2011   Procedure: PAROTIDECTOMY;  Surgeon: Izora Gala, MD;  Location: [facility];  Service: ENT;  Laterality: Right;   WRIST GANGLION EXCISION Left 1988    Family History  Problem Relation Age of Onset   Coronary artery disease Father 57       CAD,CABG   Hypertension Mother    Stroke Mother    Diabetes Sister        uncontrolled   Cancer Sister 43       uterine cancer   Aneurysm Sister        brain   Heart attack Maternal Grandmother    Blindness Paternal Grandmother    Breast cancer Neg Hx    Colon cancer Neg  Hx    Esophageal cancer Neg Hx    Rectal cancer Neg Hx    Stomach cancer Neg Hx     Social History:  reports that she has been smoking cigarettes. She has a 28.00 pack-year smoking history. She has never used smokeless tobacco. She reports current alcohol use of about 14.0 standard drinks per week. She reports that she does not use drugs.  Allergies:  Allergies  Allergen Reactions   Almond Meal Anaphylaxis and Swelling   Other Anaphylaxis    Almond Butter   Aspirin Nausea Only    Medications: I have reviewed the patient's current medications.  Results for orders placed or performed during the hospital encounter of 12/19/20 (from the past 48 hour(s))  Urinalysis, Routine w reflex microscopic Urine, Clean Catch     Status: Abnormal   Collection Time: 12/19/20  9:43 AM  Result Value Ref Range   Color, Urine YELLOW YELLOW   APPearance CLEAR CLEAR   Specific Gravity, Urine 1.020 1.005 - 1.030   pH 6.0 5.0 - 8.0   Glucose, UA NEGATIVE NEGATIVE mg/dL   Hgb urine dipstick MODERATE (A) NEGATIVE  Bilirubin Urine NEGATIVE NEGATIVE   Ketones, ur NEGATIVE NEGATIVE mg/dL   Protein, ur 30 (A) NEGATIVE mg/dL   Nitrite NEGATIVE NEGATIVE   Leukocytes,Ua NEGATIVE NEGATIVE    Comment: Performed at [facility] [address] [place], [place] [address]  Urinalysis, Microscopic (reflex)     Status: Abnormal   Collection Time: 12/19/20  9:43 AM  Result Value Ref Range   RBC / HPF 0-5 0 - 5 RBC/hpf   WBC, UA 21-50 0 - 5 WBC/hpf   Bacteria, UA RARE (A) NONE SEEN   Squamous Epithelial / LPF 0-5 0 - 5   Non Squamous Epithelial PRESENT (A) NONE SEEN    Comment: Performed at [facility], [place] [address], [place], [place] [address]  CBC with Differential     Status: Abnormal   Collection Time: 12/19/20 11:00 AM  Result Value Ref Range   WBC 12.4 (H) 4.0 - 10.5 K/uL   RBC 4.69 3.87 - 5.11 MIL/uL   Hemoglobin 10.7 (L) 12.0 - 15.0 g/dL   HCT 34.4 (L) 36.0 - 46.0 %   MCV 73.3 (L) 80.0  - 100.0 fL   MCH 22.8 (L) 26.0 - 34.0 pg   MCHC 31.1 30.0 - 36.0 g/dL   RDW 22.1 (H) 11.5 - 15.5 %   Platelets 482 (H) 150 - 400 K/uL   nRBC 0.0 0.0 - 0.2 %   Neutrophils Relative % 81 %   Neutro Abs 10.1 (H) 1.7 - 7.7 K/uL   Lymphocytes Relative 10 %   Lymphs Abs 1.3 0.7 - 4.0 K/uL   Monocytes Relative 7 %   Monocytes Absolute 0.9 0.1 - 1.0 K/uL   Eosinophils Relative 1 %   Eosinophils Absolute 0.1 0.0 - 0.5 K/uL   Basophils Relative 0 %   Basophils Absolute 0.0 0.0 - 0.1 K/uL   Immature Granulocytes 1 %   Abs Immature Granulocytes 0.06 0.00 - 0.07 K/uL    Comment: Performed at [facility], [place] [address], [place], [place] [address]  Comprehensive metabolic panel     Status: Abnormal   Collection Time: 12/19/20 11:00 AM  Result Value Ref Range   Sodium 132 (L) 135 - 145 mmol/L   Potassium 4.9 3.5 - 5.1 mmol/L   Chloride 93 (L) 98 - 111 mmol/L   CO2 20 (L) 22 - 32 mmol/L   Glucose, Bld 84 70 - 99 mg/dL    Comment: Glucose reference range applies only to samples taken after fasting for at least 8 hours.   BUN 77 (H) 6 - 20 mg/dL   Creatinine, Ser 9.50 (H) 0.44 - 1.00 mg/dL   Calcium 8.7 (L) 8.9 - 10.3 mg/dL   Total Protein 8.3 (H) 6.5 - 8.1 g/dL   Albumin 3.7 3.5 - 5.0 g/dL   AST 64 (H) 15 - 41 U/L   ALT 40 0 - 44 U/L   Alkaline Phosphatase 88 38 - 126 U/L   Total Bilirubin 0.8 0.3 - 1.2 mg/dL   GFR, Estimated 4 (L) >60 mL/min    Comment: (NOTE) Calculated using the CKD-EPI Creatinine Equation (2021)    Anion gap 19 (H) 5 - 15    Comment: Performed at [facility], [place] [address], [place],  [address]  Lipase, blood     Status: Abnormal   Collection Time: 12/19/20 11:00 AM  Result Value Ref Range   Lipase 62 (H) 11 - 51 U/L    Comment: Performed  at [facility], [place] [address], [place], [place] [address]  Troponin I (High Sensitivity)     Status: None   Collection Time: 12/19/20 11:00 AM  Result Value Ref Range   Troponin I (High Sensitivity)  6 <18 ng/L    Comment: (NOTE) Elevated high sensitivity troponin I (hsTnI) values and significant  changes across serial measurements may suggest ACS but many other  chronic and acute conditions are known to elevate hsTnI results.  Refer to the "Links" section for chest pain algorithms and additional  guidance. Performed at [facility], [place] [address] [place], [place] [address]   Lactic acid, plasma     Status: Abnormal   Collection Time: 12/19/20 11:41 AM  Result Value Ref Range   Lactic Acid, Venous 2.2 (HH) 0.5 - 1.9 mmol/L    Comment: CRITICAL RESULT CALLED TO, READ BACK BY AND VERIFIED WITH: TORI GLOSSON,RN AT 1227 12/19/2020 BY ZBEECH. Performed at [facility], [place] [address] [place], [place] [address]   Lactic acid, plasma     Status: None   Collection Time: 12/19/20 12:29 PM  Result Value Ref Range   Lactic Acid, Venous 1.8 0.5 - 1.9 mmol/L    Comment: Performed at [facility] [address] [place], [place] [address]  Troponin I (High Sensitivity)     Status: None   Collection Time: 12/19/20 12:29 PM  Result Value Ref Range   Troponin I (High Sensitivity) 5 <18 ng/L    Comment: (NOTE) Elevated high sensitivity troponin I (hsTnI) values and significant  changes across serial measurements may suggest ACS but many other  chronic and acute conditions are known to elevate hsTnI results.  Refer to the "Links" section for chest pain algorithms and additional  guidance. Performed at [facility], [place] [address] [place], [place] [address]   Resp Panel by RT-PCR (Flu A&B, Covid) Nasopharyngeal Swab     Status: None   Collection Time: 12/19/20  3:14 PM   Specimen: Nasopharyngeal Swab; Nasopharyngeal(NP) swabs in vial transport medium  Result Value Ref Range   SARS Coronavirus 2 by RT PCR NEGATIVE NEGATIVE    Comment: (NOTE) SARS-CoV-2 target nucleic acids are NOT DETECTED.  The SARS-CoV-2 RNA is generally detectable in upper  respiratory specimens during the acute phase of infection. The lowest concentration of SARS-CoV-2 viral copies this assay can detect is 138 copies/mL. A negative result does not preclude SARS-Cov-2 infection and should not be used as the sole basis for treatment or other patient management decisions. A negative result may occur with  improper specimen collection/handling, submission of specimen other than nasopharyngeal swab, presence of viral mutation(s) within the areas targeted by this assay, and inadequate number of viral copies(<138 copies/mL). A negative result must be combined with clinical observations, patient history, and epidemiological information. The expected result is Negative.  Fact Sheet for Patients:  EntrepreneurPulse.com.au  Fact Sheet for Healthcare Providers:  IncredibleEmployment.be  This test is no t yet approved or cleared by the [place] FDA and  has been authorized for detection and/or diagnosis of SARS-CoV-2 by FDA under an Emergency Use Authorization (EUA). This EUA will remain  in effect (meaning this test can be used) for the duration of the COVID-19 declaration under Section 564(b)(1) of the Act, 21 U.S.C.section 360bbb-3(b)(1), unless the authorization is terminated  or revoked sooner.       Influenza A by PCR NEGATIVE NEGATIVE   Influenza B by PCR NEGATIVE  NEGATIVE    Comment: (NOTE) The Xpert Xpress SARS-CoV-2/FLU/RSV plus assay is intended as an aid in the diagnosis of influenza from Nasopharyngeal swab specimens and should not be used as a sole basis for treatment. Nasal washings and aspirates are unacceptable for Xpert Xpress SARS-CoV-2/FLU/RSV testing.  Fact Sheet for Patients: EntrepreneurPulse.com.au  Fact Sheet for Healthcare Providers: IncredibleEmployment.be  This test is not yet approved or cleared by the [place] FDA and has been authorized for  detection and/or diagnosis of SARS-CoV-2 by FDA under an Emergency Use Authorization (EUA). This EUA will remain in effect (meaning this test can be used) for the duration of the COVID-19 declaration under Section 564(b)(1) of the Act, 21 U.S.C. section 360bbb-3(b)(1), unless the authorization is terminated or revoked.  Performed at [facility], [place] [address] [place], [place] [address]   Sodium, urine, random     Status: None   Collection Time: 12/19/20  3:31 PM  Result Value Ref Range   Sodium, Ur 75 mmol/L    Comment: Performed at [facility] [address], [place], [place] [address]  Creatinine, urine, random     Status: None   Collection Time: 12/19/20  3:31 PM  Result Value Ref Range   Creatinine, Urine 120.95 mg/dL    Comment: Performed at [facility] [address], [place], [place] [address]  Magnesium     Status: None   Collection Time: 12/19/20  4:31 PM  Result Value Ref Range   Magnesium 1.7 1.7 - 2.4 mg/dL    Comment: Performed at [facility] [address] [place], [place] [address]  Phosphorus     Status: Abnormal   Collection Time: 12/19/20  4:31 PM  Result Value Ref Range   Phosphorus 5.6 (H) 2.5 - 4.6 mg/dL    Comment: Performed at [facility] [address] [place], [place] [address]  Uric acid     Status: Abnormal   Collection Time: 12/19/20  4:31 PM  Result Value Ref Range   Uric Acid, Serum 15.1 (H) 2.5 - 7.1 mg/dL    Comment: Performed at [facility] [address], [place], [place] [address]  CBG monitoring, ED     Status: Abnormal   Collection Time: 12/19/20  6:40 PM  Result Value Ref Range   Glucose-Capillary 48 (L) 70 - 99 mg/dL    Comment: Glucose reference range applies only to samples taken after fasting for at least 8 hours.  Glucose, capillary     Status: Abnormal   Collection Time: 12/19/20  7:44 PM  Result Value Ref Range   Glucose-Capillary 100 (H) 70 - 99 mg/dL    Comment: Glucose  reference range applies only to samples taken after fasting for at least 8 hours.  Comprehensive metabolic panel     Status: Abnormal   Collection Time: 12/20/20  1:03 AM  Result Value Ref Range   Sodium 136 135 - 145 mmol/L   Potassium 4.6 3.5 - 5.1 mmol/L   Chloride 105 98 - 111 mmol/L   CO2 19 (L) 22 - 32 mmol/L   Glucose, Bld 46 (L) 70 - 99 mg/dL    Comment: Glucose reference range applies only to samples taken after fasting for at least 8 hours.   BUN 74 (H) 6 - 20 mg/dL   Creatinine, Ser 7.69 (H) 0.44 - 1.00 mg/dL   Calcium 7.7 (L) 8.9 - 10.3 mg/dL   Total Protein 6.8  6.5 - 8.1 g/dL   Albumin 3.0 (L) 3.5 - 5.0 g/dL   AST 43 (H) 15 - 41 U/L   ALT 32 0 - 44 U/L   Alkaline Phosphatase 78 38 - 126 U/L   Total Bilirubin 0.5 0.3 - 1.2 mg/dL   GFR, Estimated 6 (L) >60 mL/min    Comment: (NOTE) Calculated using the CKD-EPI Creatinine Equation (2021)    Anion gap 12 5 - 15    Comment: Performed at [facility] [address] [place], [place] [address]  CBC     Status: Abnormal   Collection Time: 12/20/20  1:03 AM  Result Value Ref Range   WBC 10.4 4.0 - 10.5 K/uL   RBC 4.05 3.87 - 5.11 MIL/uL   Hemoglobin 9.2 (L) 12.0 - 15.0 g/dL   HCT 29.2 (L) 36.0 - 46.0 %   MCV 72.1 (L) 80.0 - 100.0 fL   MCH 22.7 (L) 26.0 - 34.0 pg   MCHC 31.5 30.0 - 36.0 g/dL   RDW 21.6 (H) 11.5 - 15.5 %   Platelets 381 150 - 400 K/uL   nRBC 0.0 0.0 - 0.2 %    Comment: Performed at [facility], [place] [address] [place], [place] [address]  Vitamin B12     Status: None   Collection Time: 12/20/20  1:03 AM  Result Value Ref Range   Vitamin B-12 708 180 - 914 pg/mL    Comment: (NOTE) This assay is not validated for testing neonatal or myeloproliferative syndrome specimens for Vitamin B12 levels. Performed at [facility], [place] [address], [place], [place] [address]   Iron and TIBC     Status: Abnormal   Collection Time: 12/20/20  1:03 AM  Result Value Ref Range   Iron 30 28 - 170  ug/dL   TIBC 480 (H) 250 - 450 ug/dL   Saturation Ratios 6 (L) 10.4 - 31.8 %   UIBC 450 ug/dL    Comment: Performed at [facility], [place] [address], [place], [place] [address]  Ferritin     Status: None   Collection Time: 12/20/20  1:03 AM  Result Value Ref Range   Ferritin 44 11 - 307 ng/mL    Comment: Performed at [facility], [place] [address], [place], [place] [address]  HIV Antibody (routine testing w rflx)     Status: None   Collection Time: 12/20/20  1:03 AM  Result Value Ref Range   HIV Screen 4th Generation wRfx Non Reactive Non Reactive    Comment: Performed at [facility], [place] [address], [place], [place] [address]  Folate     Status: None   Collection Time: 12/20/20  1:03 AM  Result Value Ref Range   Folate 21.2 >5.9 ng/mL    Comment: Performed at [facility] [address], [place], [place] [address]  Glucose, capillary     Status: None   Collection Time: 12/20/20  6:01 AM  Result Value Ref Range   Glucose-Capillary 86 70 - 99 mg/dL    Comment: Glucose reference range applies only to samples taken after fasting for at least 8 hours.  Glucose, capillary     Status: Abnormal   Collection Time: 12/20/20 12:00 PM  Result Value Ref Range   Glucose-Capillary 108 (H) 70 - 99 mg/dL    Comment: Glucose reference range applies only to samples taken after fasting for at least 8 hours.  DG Chest 2 View  Result Date: 12/19/2020 CLINICAL DATA:  Chest pain, body aches and fever over the last week EXAM: CHEST - 2 VIEW COMPARISON:  01/04/2018 FINDINGS: Heart size is normal. There is a hiatal hernia. The lungs are clear. No infiltrate, collapse or effusion. No abnormal bone finding. IMPRESSION: No active cardiopulmonary disease.  Hiatal hernia. Electronically Signed   By: Nelson Chimes M.D.   On: 12/19/2020 11:04   US Abdomen Complete  Result Date: 12/19/2020 CLINICAL DATA:  Right upper quadrant pain EXAM: ABDOMEN ULTRASOUND COMPLETE COMPARISON:  None.  FINDINGS: Gallbladder: No gallstones or wall thickening visualized. No sonographic Murphy sign noted by sonographer. Common bile duct: Diameter: 5.6 Liver: Focal fatty sparing seen near the gallbladder fossa. No suspicious focal lesion identified. Increased parenchymal echogenicity. Portal vein is patent on color Doppler imaging with normal direction of blood flow towards the liver. IVC: No abnormality visualized. Pancreas: Visualized portion unremarkable. Spleen: Size and appearance within normal limits. Right Kidney: Length: 9.8 cm. Echogenicity within normal limits. No mass or hydronephrosis visualized. Simple cyst of the right kidney measuring 0.8 x 0.6 x 0.6 cm. Left Kidney: Length: 10.8 cm. Echogenicity within normal limits. No mass or hydronephrosis visualized. Abdominal aorta: No aneurysm visualized. Other findings: Solid nodule of the right upper quadrant measuring 3.3 x 2.8 x 2.9 cm which correlates with right adrenal gland nodule seen on prior CT, previously characterized as benign adenoma. IMPRESSION: 1. No sonographic findings to explain right upper quadrant pain. 2. Hepatic steatosis. Electronically Signed   By: Yetta Glassman M.D.   On: 12/19/2020 14:22   VAS US RENAL ARTERY DUPLEX  Result Date: 12/20/2020 ABDOMINAL VISCERAL Patient Name:  Stacy [name]  Date of Exam:   12/19/2020 Medical Rec #: [id number]        Accession #:    [id number] Date of Birth: [birth year]         Patient Gender: F Patient Age:   24 years Exam Location:  [facility] Procedure:      VAS US RENAL ARTERY DUPLEX Referring Phys: [id number] Orma Flaming -------------------------------------------------------------------------------- Indications: Acute renal failure High Risk Factors: Hypertension, hyperlipidemia, Diabetes, current smoker. Limitations: Body habitus. Comparison Study: Previous exam on 07/05/2019 showed a 1-59% stenosis in LRA Performing Technologist: Rogelia Rohrer RVT, RDMS  Examination Guidelines: A complete  evaluation includes B-mode imaging, spectral Doppler, color Doppler, and power Doppler as needed of all accessible portions of each vessel. Bilateral testing is considered an integral part of a complete examination. Limited examinations for reoccurring indications may be performed as noted.  Duplex Findings: +--------------------+--------+--------+------+--------+ Mesenteric          PSV cm/sEDV cm/sPlaqueComments +--------------------+--------+--------+------+--------+ Aorta Prox             63      19                  +--------------------+--------+--------+------+--------+ Celiac Artery Origin  127                          +--------------------+--------+--------+------+--------+ SMA Proximal          169      39                  +--------------------+--------+--------+------+--------+    +------------------+--------+--------+-------+ Right Renal ArteryPSV cm/sEDV cm/sComment +------------------+--------+--------+-------+ Origin               89      26           +------------------+--------+--------+-------+  Proximal            123      34           +------------------+--------+--------+-------+ Mid                  86      21           +------------------+--------+--------+-------+ Distal              111      28           +------------------+--------+--------+-------+ +-----------------+--------+--------+-------+ Left Renal ArteryPSV cm/sEDV cm/sComment +-----------------+--------+--------+-------+ Origin              85      26           +-----------------+--------+--------+-------+ Proximal           132      38           +-----------------+--------+--------+-------+ Mid                169      44           +-----------------+--------+--------+-------+ Distal             150      38           +-----------------+--------+--------+-------+ +------------+--------+--------+----+-----------+--------+--------+----+ Right KidneyPSV cm/sEDV  cm/sRI  Left KidneyPSV cm/sEDV cm/sRI   +------------+--------+--------+----+-----------+--------+--------+----+ Upper Pole  31      8       0.73Upper Pole 50      15      0.71 +------------+--------+--------+----+-----------+--------+--------+----+ Mid         30      9       0.71Mid        27      8       0.69 +------------+--------+--------+----+-----------+--------+--------+----+ Lower Pole  18      4       0.76Lower Pole 25      9       0.65 +------------+--------+--------+----+-----------+--------+--------+----+ Hilar       30      10      0.66Hilar      52      16      0.69 +------------+--------+--------+----+-----------+--------+--------+----+ +------------------+-----+------------------+-----+ Right Kidney           Left Kidney             +------------------+-----+------------------+-----+ RAR                    RAR                     +------------------+-----+------------------+-----+ RAR (manual)      1.95 RAR (manual)      2.68  +------------------+-----+------------------+-----+ Cortex            0.55 Cortex            0.52  +------------------+-----+------------------+-----+ Cortex thickness       Corex thickness         +------------------+-----+------------------+-----+ Kidney length (cm)10.96Kidney length (cm)10.60 +------------------+-----+------------------+-----+  Summary: Renal:  Right: Normal size right kidney. Normal right Resisitive Index. No        evidence of right renal artery stenosis. RRV flow present. Left:  Normal size of left kidney. Normal left Resistive Index.        1-59% stenosis of the left renal artery. LRV flow present. Mesenteric: Normal Celiac artery findings.  *See  table(s) above for measurements and observations.  Diagnosing physician: Deitra Mayo MD  Electronically signed by Deitra Mayo MD on 12/20/2020 at 10:18:45 AM.    Final     Review of Systems  HENT:  Negative for ear  discharge, ear pain, hearing loss and tinnitus.   Eyes:  Negative for photophobia and pain.  Respiratory:  Negative for cough and shortness of breath.   Cardiovascular:  Negative for chest pain.  Gastrointestinal:  Negative for abdominal pain, nausea and vomiting.  Genitourinary:  Negative for dysuria, flank pain, frequency and urgency.  Musculoskeletal:  Positive for arthralgias (Bilateral knee pain). Negative for back pain, myalgias and neck pain.  Neurological:  Negative for dizziness and headaches.  Hematological:  Does not bruise/bleed easily.  Psychiatric/Behavioral:  The patient is not nervous/anxious.   Blood pressure 120/81, pulse 81, temperature 97.9 F (36.6 C), temperature source Oral, resp. rate 17, height 5\' 3"  (1.6 m), weight 66 kg, SpO2 98 %. Physical Exam Constitutional:      General: She is not in acute distress.    Appearance: She is well-developed. She is not diaphoretic.  HENT:     Head: Normocephalic and atraumatic.  Eyes:     General: No scleral icterus.       Right eye: No discharge.        Left eye: No discharge.     Conjunctiva/sclera: Conjunctivae normal.  Cardiovascular:     Rate and Rhythm: Normal rate and regular rhythm.  Pulmonary:     Effort: Pulmonary effort is normal. No respiratory distress.  Musculoskeletal:     Cervical back: Normal range of motion.     Comments: BLE No traumatic wounds, ecchymosis, or rash  Mild TTP, mod pain with knee AROM, ~45 degrees motion  Mod bilateral knee effusion  Sens DPN, SPN, TN intact  Motor EHL, ext, flex, evers 5/5  DP 2+, PT 2+, No significant edema  Skin:    General: Skin is warm and dry.  Neurological:     Mental Status: She is alert.  Psychiatric:        Mood and Affect: Mood normal.        Behavior: Behavior normal.    Assessment/Plan: Bilateral knee pain -- Will aspirate and inject.    Lisette Abu, PA-C Orthopedic Surgery [phone] 12/20/2020, 12:22 PM

## 2020-12-21 DIAGNOSIS — M10062 Idiopathic gout, left knee: Secondary | ICD-10-CM | POA: Diagnosis not present

## 2020-12-21 DIAGNOSIS — D72829 Elevated white blood cell count, unspecified: Secondary | ICD-10-CM | POA: Diagnosis not present

## 2020-12-21 DIAGNOSIS — I701 Atherosclerosis of renal artery: Secondary | ICD-10-CM | POA: Diagnosis not present

## 2020-12-21 DIAGNOSIS — N179 Acute kidney failure, unspecified: Secondary | ICD-10-CM | POA: Diagnosis not present

## 2020-12-21 LAB — CBC WITH DIFFERENTIAL/PLATELET
Abs Immature Granulocytes: 0.02 10*3/uL (ref 0.00–0.07)
Basophils Absolute: 0 10*3/uL (ref 0.0–0.1)
Basophils Relative: 0 %
Eosinophils Absolute: 0 10*3/uL (ref 0.0–0.5)
Eosinophils Relative: 0 %
HCT: 29.6 % — ABNORMAL LOW (ref 36.0–46.0)
Hemoglobin: 9.2 g/dL — ABNORMAL LOW (ref 12.0–15.0)
Immature Granulocytes: 0 %
Lymphocytes Relative: 6 %
Lymphs Abs: 0.4 10*3/uL — ABNORMAL LOW (ref 0.7–4.0)
MCH: 22.5 pg — ABNORMAL LOW (ref 26.0–34.0)
MCHC: 31.1 g/dL (ref 30.0–36.0)
MCV: 72.4 fL — ABNORMAL LOW (ref 80.0–100.0)
Monocytes Absolute: 0.2 10*3/uL (ref 0.1–1.0)
Monocytes Relative: 2 %
Neutro Abs: 6 10*3/uL (ref 1.7–7.7)
Neutrophils Relative %: 92 %
Platelets: 383 10*3/uL (ref 150–400)
RBC: 4.09 MIL/uL (ref 3.87–5.11)
RDW: 21.5 % — ABNORMAL HIGH (ref 11.5–15.5)
WBC: 6.6 10*3/uL (ref 4.0–10.5)
nRBC: 0 % (ref 0.0–0.2)

## 2020-12-21 LAB — RENAL FUNCTION PANEL
Albumin: 3 g/dL — ABNORMAL LOW (ref 3.5–5.0)
Anion gap: 11 (ref 5–15)
BUN: 57 mg/dL — ABNORMAL HIGH (ref 6–20)
CO2: 23 mmol/L (ref 22–32)
Calcium: 8.4 mg/dL — ABNORMAL LOW (ref 8.9–10.3)
Chloride: 100 mmol/L (ref 98–111)
Creatinine, Ser: 2.87 mg/dL — ABNORMAL HIGH (ref 0.44–1.00)
GFR, Estimated: 19 mL/min — ABNORMAL LOW (ref >60)
Glucose, Bld: 196 mg/dL — ABNORMAL HIGH (ref 70–99)
Phosphorus: 2.8 mg/dL (ref 2.5–4.6)
Potassium: 5.4 mmol/L — ABNORMAL HIGH (ref 3.5–5.1)
Sodium: 134 mmol/L — ABNORMAL LOW (ref 135–145)

## 2020-12-21 LAB — BASIC METABOLIC PANEL
Anion gap: 12 (ref 5–15)
BUN: 56 mg/dL — ABNORMAL HIGH (ref 6–20)
CO2: 20 mmol/L — ABNORMAL LOW (ref 22–32)
Calcium: 8.5 mg/dL — ABNORMAL LOW (ref 8.9–10.3)
Chloride: 103 mmol/L (ref 98–111)
Creatinine, Ser: 2.91 mg/dL — ABNORMAL HIGH (ref 0.44–1.00)
GFR, Estimated: 18 mL/min — ABNORMAL LOW (ref >60)
Glucose, Bld: 186 mg/dL — ABNORMAL HIGH (ref 70–99)
Potassium: 5.7 mmol/L — ABNORMAL HIGH (ref 3.5–5.1)
Sodium: 135 mmol/L (ref 135–145)

## 2020-12-21 LAB — GLUCOSE, CAPILLARY
Glucose-Capillary: 179 mg/dL — ABNORMAL HIGH (ref 70–99)
Glucose-Capillary: 169 mg/dL — ABNORMAL HIGH (ref 70–99)
Glucose-Capillary: 198 mg/dL — ABNORMAL HIGH (ref 70–99)
Glucose-Capillary: 271 mg/dL — ABNORMAL HIGH (ref 70–99)

## 2020-12-21 LAB — RHEUMATOID FACTOR: Rheumatoid fact SerPl-aCnc: 15.5 IU/mL — ABNORMAL HIGH (ref <14.0)

## 2020-12-21 LAB — ANA W/REFLEX IF POSITIVE: Anti Nuclear Antibody (ANA): NEGATIVE

## 2020-12-21 LAB — MAGNESIUM: Magnesium: 1.2 mg/dL — ABNORMAL LOW (ref 1.7–2.4)

## 2020-12-21 MED ORDER — POTASSIUM CHLORIDE CRYS ER 20 MEQ PO TBCR: 40.0000 meq | EXTENDED_RELEASE_TABLET | ORAL | Status: DC

## 2020-12-21 MED ORDER — SODIUM ZIRCONIUM CYCLOSILICATE 10 G PO PACK
10.0000 g | PACK | Freq: Every day | ORAL | Status: DC
  Administered 2020-12-21: 10 g via ORAL
  Filled 2020-12-21: qty 1

## 2020-12-21 MED ORDER — ALPRAZOLAM 0.5 MG PO TABS
0.5000 mg | ORAL_TABLET | Freq: Two times a day (BID) | ORAL | Status: DC | PRN
  Administered 2020-12-21: 0.5 mg via ORAL
  Filled 2020-12-21: qty 1

## 2020-12-21 MED ORDER — MAGNESIUM SULFATE 4 GM/100ML IV SOLN
4.0000 g | Freq: Once | INTRAVENOUS | Status: AC
  Administered 2020-12-21: 4 g via INTRAVENOUS
  Filled 2020-12-21: qty 100

## 2020-12-21 NOTE — Progress Notes (Signed)
Patient ID: Stacy [name], female   DOB: Jun 22, 1965, 55 y.o.   MRN: [id number]  PROGRESS NOTE    Stacy [name]  EUM:[id number] DOB: 12-23-1965 DOA: 12/19/2020 PCP: Pcp, No   Brief Narrative:  55 y.o. female with medical history significant of HTN, HLD, GERD, T2DM, depression with anxiety, gout, nonobstructive CAD, left renal artery stenosis, tobacco abuse treated with abdominal pain, nausea and vomiting and not feeling well along with bilateral knee pain.  On presentation, she was slightly hypotensive, tachycardic.  WBC of 12.4; BUN of 77, creatinine of 9.5, lipase of 62.  Chest x-ray showed no acute findings.  Ultrasound of abdomen showed no acute findings to explain her abdominal pain.  She was started on IV fluids.  Nephrology was consulted.  Assessment & Plan:   Acute kidney injury/acute renal failure Anion gap metabolic acidosis -Possibly from ATN from dehydration and hypotension -Presented with creatinine of 9.5; creatinine improving to 2.87 today.  We will repeat BMP now to verify that this is actually correct.  Patient making urine.  Renal ultrasound negative for hydronephrosis -nephrology following.  IV fluids as per nephrology. -Monitor creatinine.  Strict input and output.  Daily weights.  Avoid nephrotoxic medications.  Currently making urine.  Possible UTI -Present on admission. Continue Rocephin.  Bilateral knee pain and swelling/?  Acute gout flare -Patient states that she has had similar symptoms in the past requiring arthrocentesis which was consistent with acute gout.   - Avoid colchicine because of recent nausea and vomiting -Status post bilateral arthrocentesis by orthopedics on 12/20/2020: Synovial fluid analysis consistent with acute gout.  Bilateral knees were injected with steroids per orthopedics on 12/20/2020. -Oral prednisone has also been started which will be continued.  Nausea/vomiting -Questionable cause.  Ultrasound abdomen was negative for acute  cause/gallbladder disease. -Use antiemetics as needed.  Advance diet as tolerated  Right upper quadrant pain/shoulder pain -Might be musculoskeletal as well.  Lipase only mildly elevated.  Troponin negative.  Continue current pain management.  Hypomagnesemia -Replace.  Repeat a.m. labs  Mild hyperkalemia -Repeat a.m. labs.  Leukocytosis -Possibly reactive.  Resolved  Renal artery stenosis -Renal duplex showed 1 to 59% stenosis of left renal 3.;  No evidence of right renal artery stenosis.  Normocytic anemia -Questionable cause.  Hemoglobin stable.  Outpatient follow-up  Fatty liver  Mildly elevated AST -Possibly from alcohol use.  Outpatient follow-up with GI.  Abstain from alcohol  Hypertension -Blood pressure on the lower side.  Antihypertensives on hold  Hyperlipidemia -Statin on hold because of slightly elevated LFTs  GERD -Continue Protonix  Depression with anxiety -Stable.  Continue Remeron  Adrenal mass, right -Has outpatient follow-up  Tobacco use -Continue nicotine patch.  Patient was counseled regarding cessation by admitting hospitalist   DVT prophylaxis: Heparin Code Status: Full Family Communication: None at bedside Disposition Plan: Status is: Observation inpatient because: Of need for IV fluids/AKI; knee pain   Consultants: Nephrology/Orthopedics  Procedures: Bilateral knee aspiration and steroid injection on 12/20/2020 by orthopedics  Antimicrobials: Rocephin from 12/19/2020 onwards   Subjective: Patient seen and examined at bedside.  Feels that her knee pain is improving. denies worsening abdominal pain, no overnight fever or vomiting reported. Objective: Vitals:   12/20/20 1007 12/20/20 1614 12/20/20 2118 12/21/20 0508  BP: 120/81 121/74 109/77 127/68  Pulse: 81 (!) 53 88 73  Resp: 17 17 18 18   Temp: 97.9 F (36.6 C) 98.2 F (36.8 C) 98.2 F (36.8 C) 98.2 F (36.8 C)  TempSrc: Oral  Oral Oral  SpO2: 98% 100% 99% 97%  Weight:    65.7 kg   Height:        Intake/Output Summary (Last 24 hours) at 12/21/2020 0736 Last data filed at 12/20/2020 2358 Gross per 24 hour  Intake 2007.29 ml  Output 2950 ml  Net -942.71 ml    Filed Weights   12/19/20 2123 12/20/20 2118  Weight: 66 kg 65.7 kg    Examination:  General exam: On room air currently.  No acute distress.   Respiratory system: Decreased breath sounds at bases bilaterally; scattered crackles Cardiovascular system: Mildly bradycardic; S1-S2 heard gastrointestinal system: Abdomen is distended slightly, soft and mildly tender in the right upper quadrant.  Bowel sounds are heard extremities: Mildly swollen bilateral knees with slight tenderness present  Central nervous system: Awake and alert.  No focal neurological deficits.  Moves extremities  skin: No obvious ecchymosis/rashes  psychiatry: Mood, affect and judgment are normal   Data Reviewed: I have personally reviewed following labs and imaging studies  CBC: Recent Labs  Lab 12/19/20 1100 12/20/20 0103 12/21/20 0231  WBC 12.4* 10.4 6.6  NEUTROABS 10.1*  --  6.0  HGB 10.7* 9.2* 9.2*  HCT 34.4* 29.2* 29.6*  MCV 73.3* 72.1* 72.4*  PLT 482* 381 130    Basic Metabolic Panel: Recent Labs  Lab 12/19/20 1100 12/19/20 1631 12/20/20 0103 12/21/20 0231  NA 132*  --  136 134*  K 4.9  --  4.6 5.4*  CL 93*  --  105 100  CO2 20*  --  19* 23  GLUCOSE 84  --  46* 196*  BUN 77*  --  74* 57*  CREATININE 9.50*  --  7.69* 2.87*  CALCIUM 8.7*  --  7.7* 8.4*  MG  --  1.7  --  1.2*  PHOS  --  5.6*  --  2.8    GFR: Estimated Creatinine Clearance: 19.9 mL/min (A) (by C-G formula based on SCr of 2.87 mg/dL (H)). Liver Function Tests: Recent Labs  Lab 12/19/20 1100 12/20/20 0103 12/21/20 0231  AST 64* 43*  --   ALT 40 32  --   ALKPHOS 88 78  --   BILITOT 0.8 0.5  --   PROT 8.3* 6.8  --   ALBUMIN 3.7 3.0* 3.0*    Recent Labs  Lab 12/19/20 1100  LIPASE 62*    No results for input(s): AMMONIA  in the last 168 hours. Coagulation Profile: No results for input(s): INR, PROTIME in the last 168 hours. Cardiac Enzymes: No results for input(s): CKTOTAL, CKMB, CKMBINDEX, TROPONINI in the last 168 hours. BNP (last 3 results) No results for input(s): PROBNP in the last 8760 hours. HbA1C: No results for input(s): HGBA1C in the last 72 hours. CBG: Recent Labs  Lab 12/20/20 0601 12/20/20 1200 12/20/20 1614 12/20/20 2116 12/21/20 0641  GLUCAP 86 108* 312* 183* 179*    Lipid Profile: No results for input(s): CHOL, HDL, LDLCALC, TRIG, CHOLHDL, LDLDIRECT in the last 72 hours. Thyroid Function Tests: No results for input(s): TSH, T4TOTAL, FREET4, T3FREE, THYROIDAB in the last 72 hours. Anemia Panel: Recent Labs    12/20/20 0103  VITAMINB12 708  FOLATE 21.2  FERRITIN 44  TIBC 480*  IRON 30    Sepsis Labs: Recent Labs  Lab 12/19/20 1141 12/19/20 1229  LATICACIDVEN 2.2* 1.8     Recent Results (from the past 240 hour(s))  Resp Panel by RT-PCR (Flu A&B, Covid) Nasopharyngeal Swab     Status: None  Collection Time: 12/19/20  3:14 PM   Specimen: Nasopharyngeal Swab; Nasopharyngeal(NP) swabs in vial transport medium  Result Value Ref Range Status   SARS Coronavirus 2 by RT PCR NEGATIVE NEGATIVE Final    Comment: (NOTE) SARS-CoV-2 target nucleic acids are NOT DETECTED.  The SARS-CoV-2 RNA is generally detectable in upper respiratory specimens during the acute phase of infection. The lowest concentration of SARS-CoV-2 viral copies this assay can detect is 138 copies/mL. A negative result does not preclude SARS-Cov-2 infection and should not be used as the sole basis for treatment or other patient management decisions. A negative result may occur with  improper specimen collection/handling, submission of specimen other than nasopharyngeal swab, presence of viral mutation(s) within the areas targeted by this assay, and inadequate number of viral copies(<138 copies/mL). A  negative result must be combined with clinical observations, patient history, and epidemiological information. The expected result is Negative.  Fact Sheet for Patients:  EntrepreneurPulse.com.au  Fact Sheet for Healthcare Providers:  IncredibleEmployment.be  This test is no t yet approved or cleared by the [place] FDA and  has been authorized for detection and/or diagnosis of SARS-CoV-2 by FDA under an Emergency Use Authorization (EUA). This EUA will remain  in effect (meaning this test can be used) for the duration of the COVID-19 declaration under Section 564(b)(1) of the Act, 21 U.S.C.section 360bbb-3(b)(1), unless the authorization is terminated  or revoked sooner.       Influenza A by PCR NEGATIVE NEGATIVE Final   Influenza B by PCR NEGATIVE NEGATIVE Final    Comment: (NOTE) The Xpert Xpress SARS-CoV-2/FLU/RSV plus assay is intended as an aid in the diagnosis of influenza from Nasopharyngeal swab specimens and should not be used as a sole basis for treatment. Nasal washings and aspirates are unacceptable for Xpert Xpress SARS-CoV-2/FLU/RSV testing.  Fact Sheet for Patients: EntrepreneurPulse.com.au  Fact Sheet for Healthcare Providers: IncredibleEmployment.be  This test is not yet approved or cleared by the [place] FDA and has been authorized for detection and/or diagnosis of SARS-CoV-2 by FDA under an Emergency Use Authorization (EUA). This EUA will remain in effect (meaning this test can be used) for the duration of the COVID-19 declaration under Section 564(b)(1) of the Act, 21 U.S.C. section 360bbb-3(b)(1), unless the authorization is terminated or revoked.  Performed at [facility], [place] [address], [place], [place] [address]   Body fluid culture w Gram Stain     Status: None (Preliminary result)   Collection Time: 12/20/20 12:00 PM   Specimen: Synovium; Body Fluid   Result Value Ref Range Status   Specimen Description SYNOVIAL LEFT KNEE  Final   Special Requests NONE  Final   Gram Stain   Final    ABUNDANT WBC PRESENT,BOTH PMN AND MONONUCLEAR NO ORGANISMS SEEN Performed at [facility], [address] [place], [place] [address]    Culture PENDING  Incomplete   Report Status PENDING  Incomplete  Body fluid culture w Gram Stain     Status: None (Preliminary result)   Collection Time: 12/20/20 12:37 PM   Specimen: Synovium; Body Fluid  Result Value Ref Range Status   Specimen Description SYNOVIAL RIGHT KNEE  Final   Special Requests NONE  Final   Gram Stain   Final    ABUNDANT WBC PRESENT,BOTH PMN AND MONONUCLEAR NO ORGANISMS SEEN Performed at [facility], [address], [place], [place] [address]    Culture PENDING  Incomplete   Report Status PENDING  Incomplete          Radiology Studies: DG Chest 2 View  Result Date: 12/19/2020 CLINICAL DATA:  Chest pain, body aches and fever over the last week EXAM: CHEST - 2 VIEW COMPARISON:  01/04/2018 FINDINGS: Heart size is normal. There is a hiatal hernia. The lungs are clear. No infiltrate, collapse or effusion. No abnormal bone finding. IMPRESSION: No active cardiopulmonary disease.  Hiatal hernia. Electronically Signed   By: Nelson Chimes M.D.   On: 12/19/2020 11:04   US Abdomen Complete  Result Date: 12/19/2020 CLINICAL DATA:  Right upper quadrant pain EXAM: ABDOMEN ULTRASOUND COMPLETE COMPARISON:  None. FINDINGS: Gallbladder: No gallstones or wall thickening visualized. No sonographic Murphy sign noted by sonographer. Common bile duct: Diameter: 5.6 Liver: Focal fatty sparing seen near the gallbladder fossa. No suspicious focal lesion identified. Increased parenchymal echogenicity. Portal vein is patent on color Doppler imaging with normal direction of blood flow towards the liver. IVC: No abnormality visualized. Pancreas: Visualized portion unremarkable. Spleen: Size and  appearance within normal limits. Right Kidney: Length: 9.8 cm. Echogenicity within normal limits. No mass or hydronephrosis visualized. Simple cyst of the right kidney measuring 0.8 x 0.6 x 0.6 cm. Left Kidney: Length: 10.8 cm. Echogenicity within normal limits. No mass or hydronephrosis visualized. Abdominal aorta: No aneurysm visualized. Other findings: Solid nodule of the right upper quadrant measuring 3.3 x 2.8 x 2.9 cm which correlates with right adrenal gland nodule seen on prior CT, previously characterized as benign adenoma. IMPRESSION: 1. No sonographic findings to explain right upper quadrant pain. 2. Hepatic steatosis. Electronically Signed   By: Yetta Glassman M.D.   On: 12/19/2020 14:22   VAS US RENAL ARTERY DUPLEX  Result Date: 12/20/2020 ABDOMINAL VISCERAL Patient Name:  Stacy [name] [place]  Date of Exam:   12/19/2020 Medical Rec #: [id number]        Accession #:    [id number] Date of Birth: 1965/10/20         Patient Gender: F Patient Age:   59 years Exam Location:  [facility] Procedure:      VAS US RENAL ARTERY DUPLEX Referring Phys: [id number] Orma Flaming -------------------------------------------------------------------------------- Indications: Acute renal failure High Risk Factors: Hypertension, hyperlipidemia, Diabetes, current smoker. Limitations: Body habitus. Comparison Study: Previous exam on 07/05/2019 showed a 1-59% stenosis in LRA Performing Technologist: Rogelia Rohrer RVT, RDMS  Examination Guidelines: A complete evaluation includes B-mode imaging, spectral Doppler, color Doppler, and power Doppler as needed of all accessible portions of each vessel. Bilateral testing is considered an integral part of a complete examination. Limited examinations for reoccurring indications may be performed as noted.  Duplex Findings: +--------------------+--------+--------+------+--------+ Mesenteric          PSV cm/sEDV cm/sPlaqueComments +--------------------+--------+--------+------+--------+  Aorta Prox             63      19                  +--------------------+--------+--------+------+--------+ Celiac Artery Origin  127                          +--------------------+--------+--------+------+--------+ SMA Proximal          169      39                  +--------------------+--------+--------+------+--------+    +------------------+--------+--------+-------+ Right Renal ArteryPSV cm/sEDV cm/sComment +------------------+--------+--------+-------+ Origin  89      26           +------------------+--------+--------+-------+ Proximal            123      34           +------------------+--------+--------+-------+ Mid                  86      21           +------------------+--------+--------+-------+ Distal              111      28           +------------------+--------+--------+-------+ +-----------------+--------+--------+-------+ Left Renal ArteryPSV cm/sEDV cm/sComment +-----------------+--------+--------+-------+ Origin              85      26           +-----------------+--------+--------+-------+ Proximal           132      38           +-----------------+--------+--------+-------+ Mid                169      44           +-----------------+--------+--------+-------+ Distal             150      38           +-----------------+--------+--------+-------+ +------------+--------+--------+----+-----------+--------+--------+----+ Right KidneyPSV cm/sEDV cm/sRI  Left KidneyPSV cm/sEDV cm/sRI   +------------+--------+--------+----+-----------+--------+--------+----+ Upper Pole  31      8       0.73Upper Pole 50      15      0.71 +------------+--------+--------+----+-----------+--------+--------+----+ Mid         30      9       0.71Mid        27      8       0.69 +------------+--------+--------+----+-----------+--------+--------+----+ Lower Pole  18      4       0.76Lower Pole 25      9        0.65 +------------+--------+--------+----+-----------+--------+--------+----+ Hilar       30      10      0.66Hilar      52      16      0.69 +------------+--------+--------+----+-----------+--------+--------+----+ +------------------+-----+------------------+-----+ Right Kidney           Left Kidney             +------------------+-----+------------------+-----+ RAR                    RAR                     +------------------+-----+------------------+-----+ RAR (manual)      1.95 RAR (manual)      2.68  +------------------+-----+------------------+-----+ Cortex            0.55 Cortex            0.52  +------------------+-----+------------------+-----+ Cortex thickness       Corex thickness         +------------------+-----+------------------+-----+ Kidney length (cm)10.96Kidney length (cm)10.60 +------------------+-----+------------------+-----+  Summary: Renal:  Right: Normal size right kidney. Normal right Resisitive Index. No        evidence of right renal artery stenosis. RRV flow present. Left:  Normal size of left kidney. Normal left Resistive Index.  1-59% stenosis of the left renal artery. LRV flow present. Mesenteric: Normal Celiac artery findings.  *See table(s) above for measurements and observations.  Diagnosing physician: Deitra Mayo MD  Electronically signed by Deitra Mayo MD on 12/20/2020 at 10:18:45 AM.    Final         Scheduled Meds:  heparin  5,000 Units Subcutaneous Q8H   insulin aspart  0-9 Units Subcutaneous TID WC   lidocaine  1 patch Transdermal Q24H   mirtazapine  7.5 mg Oral QHS   nicotine  21 mg Transdermal Daily   pantoprazole  40 mg Oral Daily   predniSONE  60 mg Oral Q breakfast   Continuous Infusions:  cefTRIAXone (ROCEPHIN)  IV 1 g (12/20/20 1603)          Aline August, MD [facility] 12/21/2020, 7:36 AM

## 2020-12-21 NOTE — Progress Notes (Addendum)
[facility] NEPHROLOGY PROGRESS NOTE  Assessment/ Plan: Pt is a 55 y.o. yo female  with history of hypertension, diabetes, left renal artery stenosis, nonobstructive CAD, admitted with acute renal failure and metabolic acidosis.  # Acute kidney injury, nonoliguric thought to be due to ischemic ATN due to severe dehydration concomitant with the use of NSAIDs, ARB, diuretics.  UA with some pyuria and renal Doppler ultrasound unremarkable.  Treated with IV fluid with significant improvement of serum creatinine level today.  We will repeat BMP today.  Ordered a dose of Lokelma for hyperkalemia.  She has good oral intake therefore no need for further IV fluid.  She has no sign of uremia and clinically looking good.  Continue to hold offending medications.  No need for dialysis.  Continue daily lab, strict ins and out.   # Hyponatremia: Initially improved and now 134.  Repeating lab.  # Metabolic acidosis: Improved  # Hypertension: Blood pressure acceptable.  Holding antihypertensive.  #Possible urinary tract infection on ceftriaxone.  #Hyperkalemia: Discussed about low potassium diet.  Removed or NSAIDs.  Ordered a dose of Lokelma.  #Hypomagnesemia: Repleted magnesium.  #Acute gout flare status post aspiration and steroid injection.  Seen by Ortho.    Subjective: Seen and examined.  She is clinically asymptomatic and reports doing well.  Urine output is recorded around 2.9 L.  Denies nausea, vomiting, dysgeusia, chest pain or shortness of breath.   Objective Vital signs in last 24 hours: Vitals:   12/20/20 1614 12/20/20 2118 12/21/20 0508 12/21/20 0840  BP: 121/74 109/77 127/68 114/75  Pulse: (!) 53 88 73 (!) 103  Resp: 17 18 18 16   Temp: 98.2 F (36.8 C) 98.2 F (36.8 C) 98.2 F (36.8 C) 98.3 F (36.8 C)  TempSrc:  Oral Oral Oral  SpO2: 100% 99% 97% 99%  Weight:  65.7 kg    Height:       Weight change: -0.3 kg  Intake/Output Summary (Last 24 hours) at 12/21/2020  0940 Last data filed at 12/20/2020 2358 Gross per 24 hour  Intake 1383.78 ml  Output 2100 ml  Net -716.22 ml       Labs: Basic Metabolic Panel: Recent Labs  Lab 12/19/20 1100 12/19/20 1631 12/20/20 0103 12/21/20 0231  NA 132*  --  136 134*  K 4.9  --  4.6 5.4*  CL 93*  --  105 100  CO2 20*  --  19* 23  GLUCOSE 84  --  46* 196*  BUN 77*  --  74* 57*  CREATININE 9.50*  --  7.69* 2.87*  CALCIUM 8.7*  --  7.7* 8.4*  PHOS  --  5.6*  --  2.8   Liver Function Tests: Recent Labs  Lab 12/19/20 1100 12/20/20 0103 12/21/20 0231  AST 64* 43*  --   ALT 40 32  --   ALKPHOS 88 78  --   BILITOT 0.8 0.5  --   PROT 8.3* 6.8  --   ALBUMIN 3.7 3.0* 3.0*   Recent Labs  Lab 12/19/20 1100  LIPASE 62*   No results for input(s): AMMONIA in the last 168 hours. CBC: Recent Labs  Lab 12/19/20 1100 12/20/20 0103 12/21/20 0231  WBC 12.4* 10.4 6.6  NEUTROABS 10.1*  --  6.0  HGB 10.7* 9.2* 9.2*  HCT 34.4* 29.2* 29.6*  MCV 73.3* 72.1* 72.4*  PLT 482* 381 383   Cardiac Enzymes: No results for input(s): CKTOTAL, CKMB, CKMBINDEX, TROPONINI in the last 168 hours. CBG:  Recent Labs  Lab 12/20/20 0601 12/20/20 1200 12/20/20 1614 12/20/20 2116 12/21/20 0641  GLUCAP 86 108* 312* 183* 179*    Iron Studies:  Recent Labs    12/20/20 0103  IRON 30  TIBC 480*  FERRITIN 44   Studies/Results: DG Chest 2 View  Result Date: 12/19/2020 CLINICAL DATA:  Chest pain, body aches and fever over the last week EXAM: CHEST - 2 VIEW COMPARISON:  01/04/2018 FINDINGS: Heart size is normal. There is a hiatal hernia. The lungs are clear. No infiltrate, collapse or effusion. No abnormal bone finding. IMPRESSION: No active cardiopulmonary disease.  Hiatal hernia. Electronically Signed   By: Nelson Chimes M.D.   On: 12/19/2020 11:04   US Abdomen Complete  Result Date: 12/19/2020 CLINICAL DATA:  Right upper quadrant pain EXAM: ABDOMEN ULTRASOUND COMPLETE COMPARISON:  None. FINDINGS: Gallbladder: No  gallstones or wall thickening visualized. No sonographic Murphy sign noted by sonographer. Common bile duct: Diameter: 5.6 Liver: Focal fatty sparing seen near the gallbladder fossa. No suspicious focal lesion identified. Increased parenchymal echogenicity. Portal vein is patent on color Doppler imaging with normal direction of blood flow towards the liver. IVC: No abnormality visualized. Pancreas: Visualized portion unremarkable. Spleen: Size and appearance within normal limits. Right Kidney: Length: 9.8 cm. Echogenicity within normal limits. No mass or hydronephrosis visualized. Simple cyst of the right kidney measuring 0.8 x 0.6 x 0.6 cm. Left Kidney: Length: 10.8 cm. Echogenicity within normal limits. No mass or hydronephrosis visualized. Abdominal aorta: No aneurysm visualized. Other findings: Solid nodule of the right upper quadrant measuring 3.3 x 2.8 x 2.9 cm which correlates with right adrenal gland nodule seen on prior CT, previously characterized as benign adenoma. IMPRESSION: 1. No sonographic findings to explain right upper quadrant pain. 2. Hepatic steatosis. Electronically Signed   By: Yetta Glassman M.D.   On: 12/19/2020 14:22   VAS US RENAL ARTERY DUPLEX  Result Date: 12/20/2020 ABDOMINAL VISCERAL Patient Name:  Stacy [name]  Date of Exam:   12/19/2020 Medical Rec #: [id number]        Accession #:    [id number] Date of Birth: 1965-11-26         Patient Gender: F Patient Age:   53 years Exam Location:  [facility] Procedure:      VAS US RENAL ARTERY DUPLEX Referring Phys: [id number] Orma Flaming -------------------------------------------------------------------------------- Indications: Acute renal failure High Risk Factors: Hypertension, hyperlipidemia, Diabetes, current smoker. Limitations: Body habitus. Comparison Study: Previous exam on 07/05/2019 showed a 1-59% stenosis in LRA Performing Technologist: Rogelia Rohrer RVT, RDMS  Examination Guidelines: A complete evaluation includes B-mode  imaging, spectral Doppler, color Doppler, and power Doppler as needed of all accessible portions of each vessel. Bilateral testing is considered an integral part of a complete examination. Limited examinations for reoccurring indications may be performed as noted.  Duplex Findings: +--------------------+--------+--------+------+--------+ Mesenteric          PSV cm/sEDV cm/sPlaqueComments +--------------------+--------+--------+------+--------+ Aorta Prox             63      19                  +--------------------+--------+--------+------+--------+ Celiac Artery Origin  127                          +--------------------+--------+--------+------+--------+ SMA Proximal          169      39                  +--------------------+--------+--------+------+--------+    +------------------+--------+--------+-------+  Right Renal ArteryPSV cm/sEDV cm/sComment +------------------+--------+--------+-------+ Origin               89      26           +------------------+--------+--------+-------+ Proximal            123      34           +------------------+--------+--------+-------+ Mid                  86      21           +------------------+--------+--------+-------+ Distal              111      28           +------------------+--------+--------+-------+ +-----------------+--------+--------+-------+ Left Renal ArteryPSV cm/sEDV cm/sComment +-----------------+--------+--------+-------+ Origin              85      26           +-----------------+--------+--------+-------+ Proximal           132      38           +-----------------+--------+--------+-------+ Mid                169      44           +-----------------+--------+--------+-------+ Distal             150      38           +-----------------+--------+--------+-------+ +------------+--------+--------+----+-----------+--------+--------+----+ Right KidneyPSV cm/sEDV cm/sRI  Left KidneyPSV  cm/sEDV cm/sRI   +------------+--------+--------+----+-----------+--------+--------+----+ Upper Pole  31      8       0.73Upper Pole 50      15      0.71 +------------+--------+--------+----+-----------+--------+--------+----+ Mid         30      9       0.71Mid        27      8       0.69 +------------+--------+--------+----+-----------+--------+--------+----+ Lower Pole  18      4       0.76Lower Pole 25      9       0.65 +------------+--------+--------+----+-----------+--------+--------+----+ Hilar       30      10      0.66Hilar      52      16      0.69 +------------+--------+--------+----+-----------+--------+--------+----+ +------------------+-----+------------------+-----+ Right Kidney           Left Kidney             +------------------+-----+------------------+-----+ RAR                    RAR                     +------------------+-----+------------------+-----+ RAR (manual)      1.95 RAR (manual)      2.68  +------------------+-----+------------------+-----+ Cortex            0.55 Cortex            0.52  +------------------+-----+------------------+-----+ Cortex thickness       Corex thickness         +------------------+-----+------------------+-----+ Kidney length (cm)10.96Kidney length (cm)10.60 +------------------+-----+------------------+-----+  Summary: Renal:  Right: Normal size right kidney. Normal right Resisitive Index. No        evidence of right renal artery  stenosis. RRV flow present. Left:  Normal size of left kidney. Normal left Resistive Index.        1-59% stenosis of the left renal artery. LRV flow present. Mesenteric: Normal Celiac artery findings.  *See table(s) above for measurements and observations.  Diagnosing physician: Deitra Mayo MD  Electronically signed by Deitra Mayo MD on 12/20/2020 at 10:18:45 AM.    Final     Medications: Infusions:  cefTRIAXone (ROCEPHIN)  IV 1 g (12/20/20 1603)    magnesium sulfate bolus IVPB 4 g (12/21/20 0901)    Scheduled Medications:  heparin  5,000 Units Subcutaneous Q8H   insulin aspart  0-9 Units Subcutaneous TID WC   lidocaine  1 patch Transdermal Q24H   mirtazapine  7.5 mg Oral QHS   nicotine  21 mg Transdermal Daily   pantoprazole  40 mg Oral Daily   predniSONE  60 mg Oral Q breakfast   sodium zirconium cyclosilicate  10 g Oral Daily    have reviewed scheduled and prn medications.  Physical Exam: General:NAD, comfortable Heart:RRR, s1s2 nl, no rubs Lungs:clear b/l, no crackle Abdomen:soft, Non-tender, non-distended Extremities:No peripheral edema Neurology: Alert, awake, following commands.  Ritamarie Arkin Prasad Blakelynn Scheeler 12/21/2020,9:40 AM  LOS: 1 day

## 2020-12-21 NOTE — Plan of Care (Signed)
  Problem: Activity: Goal: Risk for activity intolerance will decrease Outcome: Progressing   Problem: Pain Managment: Goal: General experience of comfort will improve Outcome: Progressing   

## 2020-12-22 DIAGNOSIS — I701 Atherosclerosis of renal artery: Secondary | ICD-10-CM | POA: Diagnosis not present

## 2020-12-22 DIAGNOSIS — N179 Acute kidney failure, unspecified: Secondary | ICD-10-CM | POA: Diagnosis not present

## 2020-12-22 DIAGNOSIS — M10062 Idiopathic gout, left knee: Secondary | ICD-10-CM | POA: Diagnosis not present

## 2020-12-22 LAB — CBC WITH DIFFERENTIAL/PLATELET
Abs Immature Granulocytes: 0.08 10*3/uL — ABNORMAL HIGH (ref 0.00–0.07)
Basophils Absolute: 0 10*3/uL (ref 0.0–0.1)
Basophils Relative: 0 %
Eosinophils Absolute: 0 10*3/uL (ref 0.0–0.5)
Eosinophils Relative: 0 %
HCT: 29.1 % — ABNORMAL LOW (ref 36.0–46.0)
Hemoglobin: 9 g/dL — ABNORMAL LOW (ref 12.0–15.0)
Immature Granulocytes: 1 %
Lymphocytes Relative: 9 %
Lymphs Abs: 0.9 10*3/uL (ref 0.7–4.0)
MCH: 22.4 pg — ABNORMAL LOW (ref 26.0–34.0)
MCHC: 30.9 g/dL (ref 30.0–36.0)
MCV: 72.6 fL — ABNORMAL LOW (ref 80.0–100.0)
Monocytes Absolute: 0.7 10*3/uL (ref 0.1–1.0)
Monocytes Relative: 7 %
Neutro Abs: 8.7 10*3/uL — ABNORMAL HIGH (ref 1.7–7.7)
Neutrophils Relative %: 83 %
Platelets: 405 10*3/uL — ABNORMAL HIGH (ref 150–400)
RBC: 4.01 MIL/uL (ref 3.87–5.11)
RDW: 21.6 % — ABNORMAL HIGH (ref 11.5–15.5)
WBC: 10.4 10*3/uL (ref 4.0–10.5)
nRBC: 0.2 % (ref 0.0–0.2)

## 2020-12-22 LAB — GLUCOSE, CAPILLARY
Glucose-Capillary: 142 mg/dL — ABNORMAL HIGH (ref 70–99)
Glucose-Capillary: 188 mg/dL — ABNORMAL HIGH (ref 70–99)
Glucose-Capillary: 182 mg/dL — ABNORMAL HIGH (ref 70–99)

## 2020-12-22 LAB — MAGNESIUM: Magnesium: 2.2 mg/dL (ref 1.7–2.4)

## 2020-12-22 LAB — RENAL FUNCTION PANEL
Albumin: 2.8 g/dL — ABNORMAL LOW (ref 3.5–5.0)
Anion gap: 6 (ref 5–15)
BUN: 49 mg/dL — ABNORMAL HIGH (ref 6–20)
CO2: 25 mmol/L (ref 22–32)
Calcium: 8.8 mg/dL — ABNORMAL LOW (ref 8.9–10.3)
Chloride: 102 mmol/L (ref 98–111)
Creatinine, Ser: 1.57 mg/dL — ABNORMAL HIGH (ref 0.44–1.00)
GFR, Estimated: 38 mL/min — ABNORMAL LOW
Glucose, Bld: 224 mg/dL — ABNORMAL HIGH (ref 70–99)
Phosphorus: 2.7 mg/dL (ref 2.5–4.6)
Potassium: 4.1 mmol/L (ref 3.5–5.1)
Sodium: 133 mmol/L — ABNORMAL LOW (ref 135–145)

## 2020-12-22 MED ORDER — METHOCARBAMOL 500 MG PO TABS: 500.0000 mg | ORAL_TABLET | Freq: Four times a day (QID) | ORAL | 0 refills | Status: DC | PRN

## 2020-12-22 MED ORDER — ONDANSETRON HCL 4 MG PO TABS: 4.0000 mg | ORAL_TABLET | Freq: Four times a day (QID) | ORAL | 0 refills | Status: DC | PRN

## 2020-12-22 MED ORDER — LIDOCAINE 5 % EX PTCH: 1.0000 | MEDICATED_PATCH | CUTANEOUS | 0 refills | Status: DC

## 2020-12-22 MED ORDER — PREDNISONE 20 MG PO TABS: 40.0000 mg | ORAL_TABLET | Freq: Every day | ORAL | 0 refills | Status: AC

## 2020-12-22 MED ORDER — ALLOPURINOL 100 MG PO TABS: ORAL_TABLET | ORAL | 0 refills | Status: DC

## 2020-12-22 MED ORDER — HYDROCODONE-ACETAMINOPHEN 5-325 MG PO TABS: 1.0000 | ORAL_TABLET | Freq: Four times a day (QID) | ORAL | 0 refills | Status: DC | PRN

## 2020-12-22 NOTE — Progress Notes (Signed)
[facility] NEPHROLOGY PROGRESS NOTE  Assessment/ Plan: Pt is a 55 y.o. yo female  with history of hypertension, diabetes, left renal artery stenosis, nonobstructive CAD, admitted with acute renal failure and metabolic acidosis.  # Acute kidney injury, nonoliguric thought to be due to ischemic ATN due to severe dehydration concomitant with the use of NSAIDs, ARB, diuretics.  UA with some pyuria and renal Doppler ultrasound unremarkable.  Treated with IV fluid with significant improvement of serum creatinine level to 1.57 from 9.5 on admission.  She is clinically doing well.  Blood pressure and volume status acceptable.  I recommend to continue holding ARB, spironolactone, HCTZ on discharge.  Recommend to check renal panel in a week with PCP.  The PCP can refer to [place] if needed.   # Hyponatremia: Discontinue HCTZ.  Monitor lab.  # Metabolic acidosis: Improved  # Hypertension: Blood pressure acceptable.  Holding antihypertensive.  #Possible urinary tract infection on ceftriaxone.  #Hyperkalemia: DC losartan, spironolactone.  Treated with Lokelma and IV fluid in the hospital.  Potassium level improved.  #Hypomagnesemia: Repleted magnesium.  Magnesium level acceptable today.  #Acute gout flare status post aspiration and steroid injection.  Seen by Ortho.    Sign off, please call back with question.  Discussed the primary team.  Subjective: Seen and examined.  She reports increased urination.  Denies nausea, vomiting, dysgeusia, chest pain, shortness of breath.  No new event.  Objective Vital signs in last 24 hours: Vitals:   12/21/20 1730 12/21/20 2005 12/22/20 0538 12/22/20 0902  BP: 122/82 (!) 130/94 (!) 145/84 128/87  Pulse: 79 84 79 75  Resp: 16 16 20 20   Temp: 97.6 F (36.4 C) (!) 97.5 F (36.4 C) 97.6 F (36.4 C) 97.7 F (36.5 C)  TempSrc: Oral Oral Oral Oral  SpO2: 98% 100% 99% 100%  Weight:  65.9 kg    Height:       Weight change: 0.2 kg  Intake/Output  Summary (Last 24 hours) at 12/22/2020 1012 Last data filed at 12/22/2020 0600 Gross per 24 hour  Intake 700 ml  Output 550 ml  Net 150 ml        Labs: Basic Metabolic Panel: Recent Labs  Lab 12/19/20 1631 12/20/20 0103 12/21/20 0231 12/21/20 0913 12/22/20 0342  NA  --    < > 134* 135 133*  K  --    < > 5.4* 5.7* 4.1  CL  --    < > 100 103 102  CO2  --    < > 23 20* 25  GLUCOSE  --    < > 196* 186* 224*  BUN  --    < > 57* 56* 49*  CREATININE  --    < > 2.87* 2.91* 1.57*  CALCIUM  --    < > 8.4* 8.5* 8.8*  PHOS 5.6*  --  2.8  --  2.7   < > = values in this interval not displayed.    Liver Function Tests: Recent Labs  Lab 12/19/20 1100 12/20/20 0103 12/21/20 0231 12/22/20 0342  AST 64* 43*  --   --   ALT 40 32  --   --   ALKPHOS 88 78  --   --   BILITOT 0.8 0.5  --   --   PROT 8.3* 6.8  --   --   ALBUMIN 3.7 3.0* 3.0* 2.8*    Recent Labs  Lab 12/19/20 1100  LIPASE 62*    No results for  input(s): AMMONIA in the last 168 hours. CBC: Recent Labs  Lab 12/19/20 1100 12/20/20 0103 12/21/20 0231 12/22/20 0342  WBC 12.4* 10.4 6.6 10.4  NEUTROABS 10.1*  --  6.0 8.7*  HGB 10.7* 9.2* 9.2* 9.0*  HCT 34.4* 29.2* 29.6* 29.1*  MCV 73.3* 72.1* 72.4* 72.6*  PLT 482* 381 383 405*    Cardiac Enzymes: No results for input(s): CKTOTAL, CKMB, CKMBINDEX, TROPONINI in the last 168 hours. CBG: Recent Labs  Lab 12/21/20 1121 12/21/20 1653 12/21/20 2153 12/22/20 0743 12/22/20 0906  GLUCAP 169* 198* 271* 142* 188*     Iron Studies:  Recent Labs    12/20/20 0103  IRON 30  TIBC 480*  FERRITIN 44    Studies/Results: No results found.  Medications: Infusions:  cefTRIAXone (ROCEPHIN)  IV 1 g (12/21/20 1718)    Scheduled Medications:  heparin  5,000 Units Subcutaneous Q8H   insulin aspart  0-9 Units Subcutaneous TID WC   lidocaine  1 patch Transdermal Q24H   mirtazapine  7.5 mg Oral QHS   nicotine  21 mg Transdermal Daily   pantoprazole  40 mg Oral  Daily   predniSONE  60 mg Oral Q breakfast   sodium zirconium cyclosilicate  10 g Oral Daily    have reviewed scheduled and prn medications.  Physical Exam: General: Pleasant female, sitting on bed comfortable. Heart:RRR, s1s2 nl, no rubs Lungs: Clear b/l, no crackle Abdomen:soft, Non-tender, non-distended Extremities:No peripheral edema Neurology: Alert, awake, following commands.  Naila Elizondo Prasad Chasya Zenz 12/22/2020,10:12 AM  LOS: 2 days

## 2020-12-22 NOTE — Evaluation (Signed)
Physical Therapy Evaluation Patient Details Name: Stacy [name] MRN: [id number] DOB: Nov 02, 1965 Today's Date: 12/22/2020  History of Present Illness  55 y.o. female presents to [facility] hospital on 12/19/2020 with abdominal pain associated with nausea and vomiting as well as bilateral knee pain. Pt admitted for AKI, possible UTI, possible gout flare. PMH includes HTN, HLD, GERD, T2DM, depression with anxiety, gout, nonobstructive CAD, left renal artery stenosis, tobacco abuse.  Clinical Impression  Pt presents to PT with deficits in activity tolerance, power, gait, balance, strength. Pt is limited by knee soreness, however reports of soreness and stiffness improve with progressive mobility. Pt is able to perform all mobility required within the home without physical assistance at this time. PT encourages continued active knee ROM to reduce soreness and improve ROM. Pt has no follow-up PT or DME needs currently.       Recommendations for follow up therapy are one component of a multi-disciplinary discharge planning process, led by the attending physician.  Recommendations may be updated based on patient status, additional functional criteria and insurance authorization.  Follow Up Recommendations No PT follow up    Assistance Recommended at Discharge None  Functional Status Assessment Patient has had a recent decline in their functional status and demonstrates the ability to make significant improvements in function in a reasonable and predictable amount of time.  Equipment Recommendations  None recommended by PT    Recommendations for Other Services       Precautions / Restrictions Precautions Precautions: Fall Restrictions Weight Bearing Restrictions: No      Mobility  Bed Mobility Overal bed mobility: Independent                  Transfers Overall transfer level: Independent                      Ambulation/Gait Ambulation/Gait assistance: Supervision Gait Distance  (Feet): 800 Feet Assistive device: None Gait Pattern/deviations: Step-through pattern Gait velocity: functional Gait velocity interpretation: 1.31 - 2.62 ft/sec, indicative of limited community ambulator   General Gait Details: pt with slowed step-through gait, 2 mild lateral loss of balance which pt correct for with stepping strategy. Pt gait speed, stride length, and stance time on RLE improve with progressive ambulation  Stairs Stairs: Yes Stairs assistance: Supervision Stair Management: One rail Right;Step to pattern (ascends forward, descends sideways) Number of Stairs: 10    Wheelchair Mobility    Modified Rankin (Stroke Patients Only)       Balance Overall balance assessment: Needs assistance Sitting-balance support: No upper extremity supported;Feet supported Sitting balance-Leahy Scale: Good     Standing balance support: No upper extremity supported;During functional activity Standing balance-Leahy Scale: Good                               Pertinent Vitals/Pain Pain Assessment: Faces Faces Pain Scale: Hurts little more Pain Location: knees Pain Descriptors / Indicators: Sore Pain Intervention(s): Monitored during session    Home Living Family/patient expects to be discharged to:: Private residence Living Arrangements: Alone Available Help at Discharge: Family;Available PRN/intermittently;Friend(s) Type of Home: House Home Access: Stairs to enter Entrance Stairs-Rails: Can reach both Entrance Stairs-Number of Steps: 4 Alternate Level Stairs-Number of Steps: 16 (8, landing, 8) Home Layout: Two level Home Equipment: Crutches      Prior Function Prior Level of Function : Independent/Modified Independent;Working/employed;Driving  Mobility Comments: works in healthcare education at [place]        Extremity/Trunk Assessment   Upper Extremity Assessment Upper Extremity Assessment: Overall WFL for tasks  assessed    Lower Extremity Assessment Lower Extremity Assessment: Generalized weakness (limited by knee soreness, at least 4/5 based on observed mobility)    Cervical / Trunk Assessment Cervical / Trunk Assessment: Normal  Communication   Communication: No difficulties  Cognition Arousal/Alertness: Awake/alert Behavior During Therapy: WFL for tasks assessed/performed Overall Cognitive Status: Within Functional Limits for tasks assessed                                          General Comments General comments (skin integrity, edema, etc.): VSS on RA    Exercises     Assessment/Plan    PT Assessment Patient needs continued PT services  PT Problem List Decreased strength;Decreased activity tolerance;Decreased balance;Decreased mobility;Pain       PT Treatment Interventions Gait training;Stair training;Functional mobility training;Therapeutic activities;Therapeutic exercise;Balance training;Patient/family education    PT Goals (Current goals can be found in the Care Plan section)  Acute Rehab PT Goals Patient Stated Goal: to go home PT Goal Formulation: With patient Time For Goal Achievement: 01/05/21 Potential to Achieve Goals: Good Additional Goals Additional Goal #1: Pt will score >19/24 on DGI to indicate a reduced risk for falls    Frequency Min 3X/week   Barriers to discharge        Co-evaluation               AM-PAC PT "6 Clicks" Mobility  Outcome Measure Help needed turning from your back to your side while in a flat bed without using bedrails?: None Help needed moving from lying on your back to sitting on the side of a flat bed without using bedrails?: None Help needed moving to and from a bed to a chair (including a wheelchair)?: None Help needed standing up from a chair using your arms (e.g., wheelchair or bedside chair)?: None Help needed to walk in hospital room?: A Little Help needed climbing 3-5 steps with a railing? : A  Little 6 Click Score: 22    End of Session   Activity Tolerance: Patient tolerated treatment well Patient left: in chair;with call bell/phone within reach Nurse Communication: Mobility status PT Visit Diagnosis: Other abnormalities of gait and mobility (R26.89);Pain;Muscle weakness (generalized) (M62.81) Pain - part of body: Knee    Time: 3295-1884 PT Time Calculation (min) (ACUTE ONLY): 21 min   Charges:   PT Evaluation $PT Eval Low Complexity: 1 Low          Zenaida Niece, PT, DPT Acute Rehabilitation Pager: [phone] Office [phone]   Zenaida Niece 12/22/2020, 9:47 AM

## 2020-12-22 NOTE — Discharge Summary (Signed)
Physician Discharge Summary  Stacy [name] IRS:[id number] DOB: Sep 20, 1965 DOA: 12/19/2020  PCP: Merryl Hacker, No  Admit date: 12/19/2020 Discharge date: 12/22/2020  Admitted From: Home Disposition: Home  Recommendations for Outpatient Follow-up:  Follow up with PCP in 1 week with repeat CBC/BMP Outpatient follow-up with nephrology if needed Outpatient follow-up with orthopedics/Dr. Lorin Mercy Follow up in ED if symptoms worsen or new appear   Home Health: No Equipment/Devices: None  Discharge Condition: Stable CODE STATUS: Full Diet recommendation: Heart healthy/carb modified  Brief/Interim Summary: 55 y.o. female with medical history significant of HTN, HLD, GERD, T2DM, depression with anxiety, gout, nonobstructive CAD, left renal artery stenosis, tobacco abuse treated with abdominal pain, nausea and vomiting and not feeling well along with bilateral knee pain.  On presentation, she was slightly hypotensive, tachycardic.  WBC of 12.4; BUN of 77, creatinine of 9.5, lipase of 62.  Chest x-ray showed no acute findings.  Ultrasound of abdomen showed no acute findings to explain her abdominal pain.  She was started on IV fluids.  Nephrology was consulted.  During the hospitalization, her condition has gradually improved; creatinine is improving.  Orthopedics was consulted and patient underwent bilateral knee arthrocentesis and steroid injection; synovial fluid was consistent with gout flare.  She was also treated with oral prednisone.  Currently her renal function has much improved, nephrology has cleared the patient for discharge.  Currently she is hemodynamically stable, tolerating diet.  She will be discharged home today on oral prednisone and allopurinol will be started.  Follow-up with PCP and orthopedics.  Follow-up with nephrology if needed.  Discharge Diagnoses:   Acute kidney injury/acute renal failure Anion gap metabolic acidosis -Possibly from ATN from dehydration and hypotension -Presented  with creatinine of 9.5; treated with IV fluids.  Creatinine much improved to 1.57 today.  - Renal ultrasound negative for hydronephrosis  -nephrology following and has cleared the patient for discharge.  Outpatient follow-up with nephrology if needed. -Outpatient follow-up of BMP by PCP  Possible UTI -Present on admission.  Treated with 3 days of Rocephin.  No need for any further antibiotics on discharge.  Currently afebrile.  Bilateral knee pain and swelling/?  Acute gout flare -Patient states that she has had similar symptoms in the past requiring arthrocentesis which was consistent with acute gout.   - Avoid colchicine because of recent nausea and vomiting -Status post bilateral arthrocentesis by orthopedics on 12/20/2020: Synovial fluid analysis consistent with acute gout.  Bilateral knees were injected with steroids per orthopedics on 12/20/2020. -Continue oral prednisone on discharge 40 mg daily for 7 days.  Outpatient follow-up with orthopedics.    Nausea/vomiting -Questionable cause.  Ultrasound abdomen was negative for acute cause/gallbladder disease. -Much improved.  Currently tolerating diet.  Right upper quadrant pain/shoulder pain -Might be musculoskeletal as well.  Lipase only mildly elevated.  Troponin negative.  Continue current pain management.   Hypomagnesemia -Resolved  Mild hyperkalemia -Resolved  Leukocytosis -Possibly reactive.  Resolved   Renal artery stenosis -Renal duplex showed 1 to 59% stenosis of left renal 3.;  No evidence of right renal artery stenosis.   Normocytic anemia -Questionable cause.  Hemoglobin stable.  Outpatient follow-up   Fatty liver  Mildly elevated AST -Possibly from alcohol use.  Outpatient follow-up with GI.  Abstain from alcohol   Hypertension -Blood pressure on the lower side.  Antihypertensives on hold   Hyperlipidemia -Statin on hold because of slightly elevated LFTs.  Outpatient follow-up with PCP.   GERD -Has  completed Protonix treatment as an  outpatient.  Follow-up with PCP.  Depression with anxiety -Stable.  Continue Remeron   Adrenal mass, right -Has outpatient follow-up   Tobacco use -Continue nicotine patch.  Patient was counseled regarding cessation by admitting hospitalist Discharge Instructions  Discharge Instructions     Diet - low sodium heart healthy   Complete by: As directed    Diet Carb Modified   Complete by: As directed    Increase activity slowly   Complete by: As directed       Allergies as of 12/22/2020       Reactions   Almond Meal Anaphylaxis, Swelling   Other Anaphylaxis   Almond Butter   Aspirin Nausea Only        Medication List     STOP taking these medications    amLODipine 5 MG tablet Commonly known as: NORVASC   aspirin EC 81 MG tablet   atorvastatin 80 MG tablet Commonly known as: LIPITOR   colchicine 0.6 MG tablet   FLUoxetine 40 MG capsule Commonly known as: PROZAC   gabapentin 300 MG capsule Commonly known as: NEURONTIN   HYDROcodone bit-homatropine 5-1.5 MG/5ML syrup Commonly known as: HYCODAN   ibuprofen 800 MG tablet Commonly known as: ADVIL   metFORMIN 850 MG tablet Commonly known as: GLUCOPHAGE   pantoprazole 40 MG tablet Commonly known as: PROTONIX   spironolactone 50 MG tablet Commonly known as: ALDACTONE   Synjardy XR 05-998 MG Tb24 Generic drug: Empagliflozin-metFORMIN HCl ER   valsartan-hydrochlorothiazide 160-12.5 MG tablet Commonly known as: DIOVAN-HCT   zolpidem 10 MG tablet Commonly known as: AMBIEN       TAKE these medications    albuterol 108 (90 Base) MCG/ACT inhaler Commonly known as: VENTOLIN HFA Inhale 2 puffs into the lungs every 4 (four) hours as needed for wheezing or shortness of breath.   allopurinol 100 MG tablet Commonly known as: ZYLOPRIM TAKE ONE TABLET BY MOUTH DAILY. START ON 04/28/2017.   EPINEPHrine 0.3 mg/0.3 mL Soaj injection Commonly known as: EPI-PEN Insert into  muscle once with any facial or neck swelling  Instruct in use   glimepiride 1 MG tablet Commonly known as: AMARYL TAKE 1 TABLET BY MOUTH DAILY BEFORE SUPPER.   glucose blood test strip Use as instructed to monitor FSBS 3x daily. Dx: E11.65   HYDROcodone-acetaminophen 5-325 MG tablet Commonly known as: NORCO/VICODIN Take 1 tablet by mouth every 6 (six) hours as needed for moderate pain.   Lancets Misc Use as instructed to monitor FSBS 3x daily. Dx: E11.65   lidocaine 5 % Commonly known as: LIDODERM Place 1 patch onto the skin daily. Remove & Discard patch within 12 hours or as directed by MD   methocarbamol 500 MG tablet Commonly known as: Robaxin Take 1 tablet (500 mg total) by mouth every 6 (six) hours as needed for muscle spasms.   mirtazapine 15 MG tablet Commonly known as: REMERON TAKE 1/2 TABLET BY MOUTH AT BEDTIME   ondansetron 4 MG tablet Commonly known as: ZOFRAN Take 1 tablet (4 mg total) by mouth every 6 (six) hours as needed for nausea.   One-A-Day [employer] 1 each by mouth daily.   pioglitazone 15 MG tablet Commonly known as: ACTOS Take 1 tablet (15 mg total) by mouth daily. Need follow up appointment   predniSONE 20 MG tablet Commonly known as: DELTASONE Take 2 tablets (40 mg total) by mouth daily with breakfast for 7 days.   SUMAtriptan 100 MG tablet Commonly known as: Imitrex Take 1  tablet (100 mg total) by mouth every 2 (two) hours as needed for migraine. May repeat in 2 hours if headache persists or recurs.        Follow-up Information     Skeet Latch, MD. Schedule an appointment as soon as possible for a visit in 1 week(s).   Specialty: Cardiology Why: with repeat cbc/bmp Contact information: [address] [place] [address] [phone]                Allergies  Allergen Reactions   Almond Meal Anaphylaxis and Swelling   Other Anaphylaxis    Almond Butter   Aspirin Nausea Only     Consultations: Nephrology/orthopedics   Procedures/Studies: DG Chest 2 View  Result Date: 12/19/2020 CLINICAL DATA:  Chest pain, body aches and fever over the last week EXAM: CHEST - 2 VIEW COMPARISON:  01/04/2018 FINDINGS: Heart size is normal. There is a hiatal hernia. The lungs are clear. No infiltrate, collapse or effusion. No abnormal bone finding. IMPRESSION: No active cardiopulmonary disease.  Hiatal hernia. Electronically Signed   By: Nelson Chimes M.D.   On: 12/19/2020 11:04   US Abdomen Complete  Result Date: 12/19/2020 CLINICAL DATA:  Right upper quadrant pain EXAM: ABDOMEN ULTRASOUND COMPLETE COMPARISON:  None. FINDINGS: Gallbladder: No gallstones or wall thickening visualized. No sonographic Murphy sign noted by sonographer. Common bile duct: Diameter: 5.6 Liver: Focal fatty sparing seen near the gallbladder fossa. No suspicious focal lesion identified. Increased parenchymal echogenicity. Portal vein is patent on color Doppler imaging with normal direction of blood flow towards the liver. IVC: No abnormality visualized. Pancreas: Visualized portion unremarkable. Spleen: Size and appearance within normal limits. Right Kidney: Length: 9.8 cm. Echogenicity within normal limits. No mass or hydronephrosis visualized. Simple cyst of the right kidney measuring 0.8 x 0.6 x 0.6 cm. Left Kidney: Length: 10.8 cm. Echogenicity within normal limits. No mass or hydronephrosis visualized. Abdominal aorta: No aneurysm visualized. Other findings: Solid nodule of the right upper quadrant measuring 3.3 x 2.8 x 2.9 cm which correlates with right adrenal gland nodule seen on prior CT, previously characterized as benign adenoma. IMPRESSION: 1. No sonographic findings to explain right upper quadrant pain. 2. Hepatic steatosis. Electronically Signed   By: Yetta Glassman M.D.   On: 12/19/2020 14:22   VAS US RENAL ARTERY DUPLEX  Result Date: 12/20/2020 ABDOMINAL VISCERAL Patient Name:  TATYANA BIBER Adams  Date  of Exam:   12/19/2020 Medical Rec #: [id number]        Accession #:    [id number] Date of Birth: 1966-01-14         Patient Gender: F Patient Age:   89 years Exam Location:  [facility] Procedure:      VAS US RENAL ARTERY DUPLEX Referring Phys: [id number] Orma Flaming -------------------------------------------------------------------------------- Indications: Acute renal failure High Risk Factors: Hypertension, hyperlipidemia, Diabetes, current smoker. Limitations: Body habitus. Comparison Study: Previous exam on 07/05/2019 showed a 1-59% stenosis in LRA Performing Technologist: Rogelia Rohrer RVT, RDMS  Examination Guidelines: A complete evaluation includes B-mode imaging, spectral Doppler, color Doppler, and power Doppler as needed of all accessible portions of each vessel. Bilateral testing is considered an integral part of a complete examination. Limited examinations for reoccurring indications may be performed as noted.  Duplex Findings: +--------------------+--------+--------+------+--------+ Mesenteric          PSV cm/sEDV cm/sPlaqueComments +--------------------+--------+--------+------+--------+ Aorta Prox             63      19                  +--------------------+--------+--------+------+--------+  Celiac Artery Origin  127                          +--------------------+--------+--------+------+--------+ SMA Proximal          169      39                  +--------------------+--------+--------+------+--------+    +------------------+--------+--------+-------+ Right Renal ArteryPSV cm/sEDV cm/sComment +------------------+--------+--------+-------+ Origin               89      26           +------------------+--------+--------+-------+ Proximal            123      34           +------------------+--------+--------+-------+ Mid                  86      21           +------------------+--------+--------+-------+ Distal              111      28            +------------------+--------+--------+-------+ +-----------------+--------+--------+-------+ Left Renal ArteryPSV cm/sEDV cm/sComment +-----------------+--------+--------+-------+ Origin              85      26           +-----------------+--------+--------+-------+ Proximal           132      38           +-----------------+--------+--------+-------+ Mid                169      44           +-----------------+--------+--------+-------+ Distal             150      38           +-----------------+--------+--------+-------+ +------------+--------+--------+----+-----------+--------+--------+----+ Right KidneyPSV cm/sEDV cm/sRI  Left KidneyPSV cm/sEDV cm/sRI   +------------+--------+--------+----+-----------+--------+--------+----+ Upper Pole  31      8       0.73Upper Pole 50      15      0.71 +------------+--------+--------+----+-----------+--------+--------+----+ Mid         30      9       0.71Mid        27      8       0.69 +------------+--------+--------+----+-----------+--------+--------+----+ Lower Pole  18      4       0.76Lower Pole 25      9       0.65 +------------+--------+--------+----+-----------+--------+--------+----+ Hilar       30      10      0.66Hilar      52      16      0.69 +------------+--------+--------+----+-----------+--------+--------+----+ +------------------+-----+------------------+-----+ Right Kidney           Left Kidney             +------------------+-----+------------------+-----+ RAR                    RAR                     +------------------+-----+------------------+-----+ RAR (manual)      1.95 RAR (manual)      2.68  +------------------+-----+------------------+-----+ Cortex  0.55 Cortex            0.52  +------------------+-----+------------------+-----+ Cortex thickness       Corex thickness         +------------------+-----+------------------+-----+ Kidney length  (cm)10.96Kidney length (cm)10.60 +------------------+-----+------------------+-----+  Summary: Renal:  Right: Normal size right kidney. Normal right Resisitive Index. No        evidence of right renal artery stenosis. RRV flow present. Left:  Normal size of left kidney. Normal left Resistive Index.        1-59% stenosis of the left renal artery. LRV flow present. Mesenteric: Normal Celiac artery findings.  *See table(s) above for measurements and observations.  Diagnosing physician: Deitra Mayo MD  Electronically signed by Deitra Mayo MD on 12/20/2020 at 10:18:45 AM.    Final       Subjective: Patient seen and examined at bedside.  Feels much better and thinks that she would be okay going home.  No overnight fever, vomiting, worsening shortness of breath reported.  Still complains of intermittent knee pain.  Discharge Exam: Vitals:   12/22/20 0538 12/22/20 0902  BP: (!) 145/84 128/87  Pulse: 79 75  Resp: 20 20  Temp: 97.6 F (36.4 C) 97.7 F (36.5 C)  SpO2: 99% 100%    General: Pt is alert, awake, not in acute distress Cardiovascular: rate controlled, S1/S2 + Respiratory: bilateral decreased breath sounds at bases Abdominal: Soft, NT, ND, bowel sounds + Extremities: no edema, no cyanosis.  Bilateral knees only mildly tender.    The results of significant diagnostics from this hospitalization (including imaging, microbiology, ancillary and laboratory) are listed below for reference.     Microbiology: Recent Results (from the past 240 hour(s))  Resp Panel by RT-PCR (Flu A&B, Covid) Nasopharyngeal Swab     Status: None   Collection Time: 12/19/20  3:14 PM   Specimen: Nasopharyngeal Swab; Nasopharyngeal(NP) swabs in vial transport medium  Result Value Ref Range Status   SARS Coronavirus 2 by RT PCR NEGATIVE NEGATIVE Final    Comment: (NOTE) SARS-CoV-2 target nucleic acids are NOT DETECTED.  The SARS-CoV-2 RNA is generally detectable in upper  respiratory specimens during the acute phase of infection. The lowest concentration of SARS-CoV-2 viral copies this assay can detect is 138 copies/mL. A negative result does not preclude SARS-Cov-2 infection and should not be used as the sole basis for treatment or other patient management decisions. A negative result may occur with  improper specimen collection/handling, submission of specimen other than nasopharyngeal swab, presence of viral mutation(s) within the areas targeted by this assay, and inadequate number of viral copies(<138 copies/mL). A negative result must be combined with clinical observations, patient history, and epidemiological information. The expected result is Negative.  Fact Sheet for Patients:  EntrepreneurPulse.com.au  Fact Sheet for Healthcare Providers:  IncredibleEmployment.be  This test is no t yet approved or cleared by the [place] FDA and  has been authorized for detection and/or diagnosis of SARS-CoV-2 by FDA under an Emergency Use Authorization (EUA). This EUA will remain  in effect (meaning this test can be used) for the duration of the COVID-19 declaration under Section 564(b)(1) of the Act, 21 U.S.C.section 360bbb-3(b)(1), unless the authorization is terminated  or revoked sooner.       Influenza A by PCR NEGATIVE NEGATIVE Final   Influenza B by PCR NEGATIVE NEGATIVE Final    Comment: (NOTE) The Xpert Xpress SARS-CoV-2/FLU/RSV plus assay is intended as an aid in the diagnosis of influenza from Nasopharyngeal swab specimens and  should not be used as a sole basis for treatment. Nasal washings and aspirates are unacceptable for Xpert Xpress SARS-CoV-2/FLU/RSV testing.  Fact Sheet for Patients: EntrepreneurPulse.com.au  Fact Sheet for Healthcare Providers: IncredibleEmployment.be  This test is not yet approved or cleared by the [place] FDA and has been  authorized for detection and/or diagnosis of SARS-CoV-2 by FDA under an Emergency Use Authorization (EUA). This EUA will remain in effect (meaning this test can be used) for the duration of the COVID-19 declaration under Section 564(b)(1) of the Act, 21 U.S.C. section 360bbb-3(b)(1), unless the authorization is terminated or revoked.  Performed at [facility], [place] [address] [place], [place] [address]   Body fluid culture w Gram Stain     Status: None (Preliminary result)   Collection Time: 12/20/20 12:00 PM   Specimen: Synovium; Body Fluid  Result Value Ref Range Status   Specimen Description SYNOVIAL LEFT KNEE  Final   Special Requests NONE  Final   Gram Stain   Final    ABUNDANT WBC PRESENT,BOTH PMN AND MONONUCLEAR NO ORGANISMS SEEN    Culture   Final    CULTURE REINCUBATED FOR BETTER GROWTH Performed at [facility], [address], [place], [place] [address]    Report Status PENDING  Incomplete  Body fluid culture w Gram Stain     Status: None (Preliminary result)   Collection Time: 12/20/20 12:37 PM   Specimen: Synovium; Body Fluid  Result Value Ref Range Status   Specimen Description SYNOVIAL RIGHT KNEE  Final   Special Requests NONE  Final   Gram Stain   Final    ABUNDANT WBC PRESENT,BOTH PMN AND MONONUCLEAR NO ORGANISMS SEEN    Culture   Final    CULTURE REINCUBATED FOR BETTER GROWTH Performed at [facility], [address] [place], [place] [address]    Report Status PENDING  Incomplete     Labs: BNP (last 3 results) No results for input(s): BNP in the last 8760 hours. Basic Metabolic Panel: Recent Labs  Lab 12/19/20 1100 12/19/20 1631 12/20/20 0103 12/21/20 0231 12/21/20 0913 12/22/20 0342  NA 132*  --  136 134* 135 133*  K 4.9  --  4.6 5.4* 5.7* 4.1  CL 93*  --  105 100 103 102  CO2 20*  --  19* 23 20* 25  GLUCOSE 84  --  46* 196* 186* 224*  BUN 77*  --  74* 57* 56* 49*  CREATININE 9.50*  --  7.69* 2.87* 2.91* 1.57*   CALCIUM 8.7*  --  7.7* 8.4* 8.5* 8.8*  MG  --  1.7  --  1.2*  --  2.2  PHOS  --  5.6*  --  2.8  --  2.7   Liver Function Tests: Recent Labs  Lab 12/19/20 1100 12/20/20 0103 12/21/20 0231 12/22/20 0342  AST 64* 43*  --   --   ALT 40 32  --   --   ALKPHOS 88 78  --   --   BILITOT 0.8 0.5  --   --   PROT 8.3* 6.8  --   --   ALBUMIN 3.7 3.0* 3.0* 2.8*   Recent Labs  Lab 12/19/20 1100  LIPASE 62*   No results for input(s): AMMONIA in the last 168 hours. CBC: Recent Labs  Lab 12/19/20 1100 12/20/20 0103 12/21/20 0231 12/22/20 0342  WBC 12.4* 10.4 6.6 10.4  NEUTROABS 10.1*  --  6.0 8.7*  HGB  10.7* 9.2* 9.2* 9.0*  HCT 34.4* 29.2* 29.6* 29.1*  MCV 73.3* 72.1* 72.4* 72.6*  PLT 482* 381 383 405*   Cardiac Enzymes: No results for input(s): CKTOTAL, CKMB, CKMBINDEX, TROPONINI in the last 168 hours. BNP: Invalid input(s): POCBNP CBG: Recent Labs  Lab 12/21/20 1121 12/21/20 1653 12/21/20 2153 12/22/20 0743 12/22/20 0906  GLUCAP 169* 198* 271* 142* 188*   D-Dimer No results for input(s): DDIMER in the last 72 hours. Hgb A1c No results for input(s): HGBA1C in the last 72 hours. Lipid Profile No results for input(s): CHOL, HDL, LDLCALC, TRIG, CHOLHDL, LDLDIRECT in the last 72 hours. Thyroid function studies No results for input(s): TSH, T4TOTAL, T3FREE, THYROIDAB in the last 72 hours.  Invalid input(s): FREET3 Anemia work up Recent Labs    12/20/20 0103  VITAMINB12 708  FOLATE 21.2  FERRITIN 44  TIBC 480*  IRON 30   Urinalysis    Component Value Date/Time   COLORURINE YELLOW 12/19/2020 [place] 12/19/2020 0943   LABSPEC 1.020 12/19/2020 0943   PHURINE 6.0 12/19/2020 0943   GLUCOSEU NEGATIVE 12/19/2020 0943   HGBUR MODERATE (A) 12/19/2020 0943   BILIRUBINUR NEGATIVE 12/19/2020 0943   BILIRUBINUR neg 02/26/2014 [place] 12/19/2020 0943   PROTEINUR 30 (A) 12/19/2020 0943   UROBILINOGEN negative 02/26/2014 0952    NITRITE NEGATIVE 12/19/2020 0943   LEUKOCYTESUR NEGATIVE 12/19/2020 0943   Sepsis Labs Invalid input(s): PROCALCITONIN,  WBC,  LACTICIDVEN Microbiology Recent Results (from the past 240 hour(s))  Resp Panel by RT-PCR (Flu A&B, Covid) Nasopharyngeal Swab     Status: None   Collection Time: 12/19/20  3:14 PM   Specimen: Nasopharyngeal Swab; Nasopharyngeal(NP) swabs in vial transport medium  Result Value Ref Range Status   SARS Coronavirus 2 by RT PCR NEGATIVE NEGATIVE Final    Comment: (NOTE) SARS-CoV-2 target nucleic acids are NOT DETECTED.  The SARS-CoV-2 RNA is generally detectable in upper respiratory specimens during the acute phase of infection. The lowest concentration of SARS-CoV-2 viral copies this assay can detect is 138 copies/mL. A negative result does not preclude SARS-Cov-2 infection and should not be used as the sole basis for treatment or other patient management decisions. A negative result may occur with  improper specimen collection/handling, submission of specimen other than nasopharyngeal swab, presence of viral mutation(s) within the areas targeted by this assay, and inadequate number of viral copies(<138 copies/mL). A negative result must be combined with clinical observations, patient history, and epidemiological information. The expected result is Negative.  Fact Sheet for Patients:  EntrepreneurPulse.com.au  Fact Sheet for Healthcare Providers:  IncredibleEmployment.be  This test is no t yet approved or cleared by the [place] FDA and  has been authorized for detection and/or diagnosis of SARS-CoV-2 by FDA under an Emergency Use Authorization (EUA). This EUA will remain  in effect (meaning this test can be used) for the duration of the COVID-19 declaration under Section 564(b)(1) of the Act, 21 U.S.C.section 360bbb-3(b)(1), unless the authorization is terminated  or revoked sooner.       Influenza A by PCR  NEGATIVE NEGATIVE Final   Influenza B by PCR NEGATIVE NEGATIVE Final    Comment: (NOTE) The Xpert Xpress SARS-CoV-2/FLU/RSV plus assay is intended as an aid in the diagnosis of influenza from Nasopharyngeal swab specimens and should not be used as a sole basis for treatment. Nasal washings and aspirates are unacceptable for Xpert Xpress SARS-CoV-2/FLU/RSV testing.  Fact Sheet for Patients: EntrepreneurPulse.com.au  Fact Sheet  for Healthcare Providers: IncredibleEmployment.be  This test is not yet approved or cleared by the [place] and has been authorized for detection and/or diagnosis of SARS-CoV-2 by FDA under an Emergency Use Authorization (EUA). This EUA will remain in effect (meaning this test can be used) for the duration of the COVID-19 declaration under Section 564(b)(1) of the Act, 21 U.S.C. section 360bbb-3(b)(1), unless the authorization is terminated or revoked.  Performed at [facility], [place] [address], [place], [place] [address]   Body fluid culture w Gram Stain     Status: None (Preliminary result)   Collection Time: 12/20/20 12:00 PM   Specimen: Synovium; Body Fluid  Result Value Ref Range Status   Specimen Description SYNOVIAL LEFT KNEE  Final   Special Requests NONE  Final   Gram Stain   Final    ABUNDANT WBC PRESENT,BOTH PMN AND MONONUCLEAR NO ORGANISMS SEEN    Culture   Final    CULTURE REINCUBATED FOR BETTER GROWTH Performed at [facility], [address] [place], [place] [address]    Report Status PENDING  Incomplete  Body fluid culture w Gram Stain     Status: None (Preliminary result)   Collection Time: 12/20/20 12:37 PM   Specimen: Synovium; Body Fluid  Result Value Ref Range Status   Specimen Description SYNOVIAL RIGHT KNEE  Final   Special Requests NONE  Final   Gram Stain   Final    ABUNDANT WBC PRESENT,BOTH PMN AND MONONUCLEAR NO ORGANISMS SEEN    Culture   Final    CULTURE  REINCUBATED FOR BETTER GROWTH Performed at [facility], [address], [place], [place] [address]    Report Status PENDING  Incomplete     Time coordinating discharge: 35 minutes  SIGNED:   Aline August, MD  [facility] 12/22/2020, 10:13 AM

## 2020-12-22 NOTE — Progress Notes (Signed)
DISCHARGE NOTE HOME Stacy [name] to be discharged Home per MD order. Discussed prescriptions and follow up appointments with the patient. Prescriptions given to patient; medication list explained in detail. Patient verbalized understanding.  Skin clean, dry and intact without evidence of skin break down, no evidence of skin tears noted. IV catheter discontinued intact. Site without signs and symptoms of complications. Dressing and pressure applied. Pt denies pain at the site currently. No complaints noted.  Patient free of lines, drains, and wounds.   An After Visit Summary (AVS) was printed and given to the patient. Patient escorted via wheelchair, and discharged home via private auto.  Arlyss Repress, RN

## 2020-12-23 DIAGNOSIS — M659 Synovitis and tenosynovitis, unspecified: Secondary | ICD-10-CM

## 2020-12-23 LAB — BODY FLUID CULTURE W GRAM STAIN
Culture: NO GROWTH
Culture: NO GROWTH

## 2020-12-23 LAB — PATHOLOGIST SMEAR REVIEW

## 2020-12-26 ENCOUNTER — Other Ambulatory Visit: Payer: Self-pay | Admitting: Orthopaedic Surgery

## 2020-12-27 ENCOUNTER — Other Ambulatory Visit: Payer: Self-pay

## 2020-12-27 ENCOUNTER — Ambulatory Visit (INDEPENDENT_AMBULATORY_CARE_PROVIDER_SITE_OTHER): Payer: Commercial Managed Care - PPO | Admitting: Orthopaedic Surgery

## 2020-12-27 VITALS — BP 159/96 | HR 107 | Ht 63.0 in | Wt 145.8 lb

## 2020-12-27 DIAGNOSIS — G8929 Other chronic pain: Secondary | ICD-10-CM

## 2020-12-27 DIAGNOSIS — M25561 Pain in right knee: Secondary | ICD-10-CM

## 2020-12-27 DIAGNOSIS — M25462 Effusion, left knee: Secondary | ICD-10-CM

## 2020-12-27 DIAGNOSIS — M25461 Effusion, right knee: Secondary | ICD-10-CM | POA: Diagnosis not present

## 2020-12-27 DIAGNOSIS — M10062 Idiopathic gout, left knee: Secondary | ICD-10-CM

## 2020-12-27 DIAGNOSIS — M25562 Pain in left knee: Secondary | ICD-10-CM

## 2020-12-27 DIAGNOSIS — M659 Synovitis and tenosynovitis, unspecified: Secondary | ICD-10-CM | POA: Diagnosis not present

## 2020-12-27 NOTE — Progress Notes (Signed)
Office Visit Note   Patient: Stacy [name]           Date of Birth: 05-12-1965           MRN: [id number] Visit Date: 12/27/2020              Requested by: No referring provider defined for this encounter. PCP: Pcp, No   Assessment & Plan: Visit Diagnoses:  1. Chronic pain of both knees   2. Bilateral knee swelling   3. Synovitis of right knee   4. Idiopathic gout of left knee, unspecified chronicity     Plan: Knee aspiration performed with yellow cloudy fluid 20 cc.  She got improvement in pain there is typical crystals may be related from previous cortisone injection.  I will call her with the lab work.  Follow-Up Instructions: Return if symptoms worsen or fail to improve.   Orders:  Orders Placed This Encounter  Procedures   Comprehensive Metabolic Panel (CMET)   Uric acid   EXTRA LAV TOP TUBE   No orders of the defined types were placed in this encounter.     Procedures: No procedures performed   Clinical Data: No additional findings.   Subjective: No chief complaint on file.   HPI Stacy [name] recently in the hospital with dehydration and acute renal failure.  She had gout is on allopurinol also on prednisone.  Avoiding anti-inflammatories and limited colchicine due to renal function.  She has had increased problems with right knee swelling and stiffness.  I talked her on the phone and she is in to get uric acid and c-Met obtained.  She has an appointment with Dr. Joya Gaskins January 15 or 17.  Review of Systems negative for fever chills positive for gout with uric acid 15 when she was in the hospital.   Objective: Vital Signs: BP (!) 159/96   Pulse (!) 107   Ht _0  (1.6 m)   Wt 145 lb 12.8 oz (66.1 kg)   BMI 25.83 kg/m   Physical Exam Constitutional:      Appearance: She is well-developed.  HENT:     Head: Normocephalic.     Right Ear: External ear normal.     Left Ear: External ear normal. There is no impacted cerumen.  Eyes:     Pupils: Pupils are  equal, round, and reactive to light.  Neck:     Thyroid: No thyromegaly.     Trachea: No tracheal deviation.  Cardiovascular:     Rate and Rhythm: Normal rate.  Pulmonary:     Effort: Pulmonary effort is normal.  Abdominal:     Palpations: Abdomen is soft.  Musculoskeletal:     Cervical back: No rigidity.  Skin:    General: Skin is warm and dry.  Neurological:     Mental Status: She is alert and oriented to person, place, and time.  Psychiatric:        Behavior: Behavior normal.    Ortho Exam right knee more than left knee synovitis painful with range of motion knee is not hot.  Collateral ligaments are stable.  Specialty Comments:  No specialty comments available.  Imaging: No results found.   PMFS History: Patient Active Problem List   Diagnosis Date Noted   Synovitis of right knee 12/28/2020   Synovitis of left knee    AKI (acute kidney injury) ([place]) 12/20/2020   Acute renal failure ([place]) 12/19/2020   Leukocytosis 12/19/2020   Increased anion gap metabolic acidosis 81/44/8185  Anemia 12/19/2020   Renal artery stenosis ([facility]) 08/02/2019   Acute bronchitis 08/02/2019   Adenoma of right adrenal gland 01/16/2019   Coronary artery calcification seen on CAT scan 04/12/2018   Insomnia 01/01/2015   Fatty liver 01/01/2015   Adrenal mass, right Dr Erskine Squibb 03/13/2014   Thyroid nodule  managed Dr. Posey Pronto 02/27/2014   EtOH dependence ([place]) 11/22/2013   Bereavement 10/04/2013   Tobacco use 10/04/2013   MDD (major depressive disorder) 08/23/2013   Colon polyp 08/23/2013   Migraines 08/23/2013   GERD (gastroesophageal reflux disease)    Depression with anxiety    Type 2 diabetes mellitus with other specified complication ([place])    Gout    Hypertension    Hyperlipemia    Past Medical History:  Diagnosis Date   Acute bronchitis 08/02/2019   Adrenal adenoma    as per MRI 03/09/14   Allergy    Anxiety    Coronary artery calcification seen on CAT scan 04/12/2018    Depression    Diabetes mellitus    Diverticulosis    Elevated LFTs    elevated since 09/2013   Fatty liver    moderate- as per 03/06/17 MRI liver   GERD (gastroesophageal reflux disease)    Gout    H/O hiatal hernia    Headache(784.0)    History of depression    Hyperlipemia    Hyperplastic colon polyp    Hypertension    Positive hepatitis C antibody test    RNA NEGATIVE   Renal artery stenosis ([place]) 08/02/2019   06/2019: 1-59% stenosis on L    Thyroid nodule  managed Dr. Posey Pronto 02/27/2014   Tobacco abuse    Tubular adenoma of colon     Family History  Problem Relation Age of Onset   Coronary artery disease Father 19       CAD,CABG   Hypertension Mother    Stroke Mother    Diabetes Sister        uncontrolled   Cancer Sister 62       uterine cancer   Aneurysm Sister        brain   Heart attack Maternal Grandmother    Blindness Paternal Grandmother    Breast cancer Neg Hx    Colon cancer Neg Hx    Esophageal cancer Neg Hx    Rectal cancer Neg Hx    Stomach cancer Neg Hx     Past Surgical History:  Procedure Laterality Date   ANAL FISTULECTOMY  01/19/1985   BREAST EXCISIONAL BIOPSY Left    BREAST LUMPECTOMY Left 1988   PAROTIDECTOMY  03/30/2011   Procedure: PAROTIDECTOMY;  Surgeon: Izora Gala, MD;  Location: [facility];  Service: ENT;  Laterality: Right;   WRIST GANGLION EXCISION Left 1988   Social History   Occupational History    Employer: [employer]  Tobacco Use   Smoking status: Every Day    Packs/day: 1.00    Years: 28.00    Pack years: 28.00    Types: Cigarettes   Smokeless tobacco: Never  Vaping Use   Vaping Use: Never used  Substance and Sexual Activity   Alcohol use: Yes    Alcohol/week: 14.0 standard drinks    Types: 14 Standard drinks or equivalent per week   Drug use: No   Sexual activity: Not Currently    Birth control/protection: Post-menopausal

## 2020-12-28 ENCOUNTER — Telehealth: Payer: Self-pay | Admitting: Orthopaedic Surgery

## 2020-12-28 DIAGNOSIS — M659 Synovitis and tenosynovitis, unspecified: Secondary | ICD-10-CM | POA: Insufficient documentation

## 2020-12-28 LAB — COMPREHENSIVE METABOLIC PANEL
AG Ratio: 1.1 (calc) (ref 1.0–2.5)
ALT: 87 U/L — ABNORMAL HIGH (ref 6–29)
AST: 42 U/L — ABNORMAL HIGH (ref 10–35)
Albumin: 3.9 g/dL (ref 3.6–5.1)
Alkaline phosphatase (APISO): 80 U/L (ref 37–153)
BUN: 22 mg/dL (ref 7–25)
CO2: 28 mmol/L (ref 20–32)
Calcium: 10.8 mg/dL — ABNORMAL HIGH (ref 8.6–10.4)
Chloride: 103 mmol/L (ref 98–110)
Creat: 0.93 mg/dL (ref 0.50–1.03)
Globulin: 3.4 g/dL (calc) (ref 1.9–3.7)
Glucose, Bld: 182 mg/dL — ABNORMAL HIGH (ref 65–99)
Potassium: 4.2 mmol/L (ref 3.5–5.3)
Sodium: 142 mmol/L (ref 135–146)
Total Bilirubin: 0.2 mg/dL (ref 0.2–1.2)
Total Protein: 7.3 g/dL (ref 6.1–8.1)

## 2020-12-28 LAB — EXTRA LAV TOP TUBE

## 2020-12-28 LAB — URIC ACID: Uric Acid, Serum: 4.8 mg/dL (ref 2.5–7.0)

## 2020-12-28 NOTE — Telephone Encounter (Signed)
See note

## 2021-01-01 ENCOUNTER — Telehealth: Payer: Self-pay | Admitting: Radiology

## 2021-01-01 ENCOUNTER — Telehealth: Payer: Self-pay | Admitting: Orthopaedic Surgery

## 2021-01-01 NOTE — Telephone Encounter (Signed)
Patient needs work note on letterhead stating that she has been under Dr. Lorin Mercy care since 12/19/2020 with gout and dehydration. She is out of work 12/18/2020-01/02/2021 and may return to work 01/02/2021.  Letter needs to be faxed to   Attn: Geronimo [phone]

## 2021-01-01 NOTE — Telephone Encounter (Signed)
Received $25.00 check,medical records release form and FMLA/disability paperwork from patient/forwarding to [facility] today

## 2021-01-03 NOTE — Telephone Encounter (Signed)
Patient spoke with Dr. Lorin Mercy and work note was extended. Patient out of work 01/02/2021 and 01/03/2021.  Note was faxed to Bronson Ing at [phone].

## 2021-01-08 ENCOUNTER — Encounter: Payer: Self-pay | Admitting: Orthopedic Surgery

## 2021-01-08 NOTE — Progress Notes (Signed)
Office Visit Note   Patient: Stacy [name]           Date of Birth: Oct 20, 1965           MRN: [id number] Visit Date: 12/02/2020              Requested by: No referring provider defined for this encounter. PCP: Pcp, No  Chief Complaint  Patient presents with   Left Foot - Fracture, Follow-up      HPI: Patient is status post metatarsal neck fractures of the fourth and fifth metatarsals.  Patient is currently ambulating in regular shoes she feels fine.  Assessment & Plan: Visit Diagnoses:  1. Displaced fracture of fifth metatarsal bone, left foot, initial encounter for closed fracture     Plan: Patient will advance to regular shoewear without restrictions.  Follow-Up Instructions: Return if symptoms worsen or fail to improve.   Ortho Exam  Patient is alert, oriented, no adenopathy, well-dressed, normal affect, normal respiratory effort. Examination there is no pain to palpation of the fracture sites.  Patient is warm high-heeled boots without symptoms.  Imaging: No results found. No images are attached to the encounter.  Labs: Lab Results  Component Value Date   HGBA1C 8.1 06/19/2020   HGBA1C 7.3 (H) 12/04/2019   HGBA1C 8.3 (A) 06/23/2019   LABURIC 4.8 12/27/2020   LABURIC 15.1 (H) 12/19/2020   LABURIC 9.9 (H) 04/14/2017   REPTSTATUS 12/23/2020 FINAL 12/20/2020   GRAMSTAIN  12/20/2020    ABUNDANT WBC PRESENT,BOTH PMN AND MONONUCLEAR NO ORGANISMS SEEN    CULT  12/20/2020    NO GROWTH 3 DAYS Performed at [facility], [place] [address] [place], [place] [address]    LABORGA NO GROWTH 12/23/2015     Lab Results  Component Value Date   ALBUMIN 2.8 (L) 12/22/2020   ALBUMIN 3.0 (L) 12/21/2020   ALBUMIN 3.0 (L) 12/20/2020    Lab Results  Component Value Date   MG 2.2 12/22/2020   MG 1.2 (L) 12/21/2020   MG 1.7 12/19/2020   Lab Results  Component Value Date   VD25OH 44 09/30/2013    No results found for: PREALBUMIN CBC EXTENDED Latest Ref Rng  & Units 12/22/2020 12/21/2020 12/20/2020  WBC 4.0 - 10.5 K/uL 10.4 6.6 10.4  RBC 3.87 - 5.11 MIL/uL 4.01 4.09 4.05  HGB 12.0 - 15.0 g/dL 9.0(L) 9.2(L) 9.2(L)  HCT 36.0 - 46.0 % 29.1(L) 29.6(L) 29.2(L)  PLT 150 - 400 K/uL 405(H) 383 381  NEUTROABS 1.7 - 7.7 K/uL 8.7(H) 6.0 -  LYMPHSABS 0.7 - 4.0 K/uL 0.9 0.4(L) -     There is no height or weight on file to calculate BMI.  Orders:  Orders Placed This Encounter  Procedures   XR Foot Complete Left   No orders of the defined types were placed in this encounter.    Procedures: No procedures performed  Clinical Data: No additional findings.  ROS:  All other systems negative, except as noted in the HPI. Review of Systems  Objective: Vital Signs: There were no vitals taken for this visit.  Specialty Comments:  No specialty comments available.  PMFS History: Patient Active Problem List   Diagnosis Date Noted   Synovitis of right knee 12/28/2020   Synovitis of left knee    AKI (acute kidney injury) ([place]) 12/20/2020   Acute renal failure ([place]) 12/19/2020   Leukocytosis 12/19/2020   Increased anion gap metabolic acidosis 62/13/0865   Anemia 12/19/2020   Renal artery  stenosis ([place]) 08/02/2019   Acute bronchitis 08/02/2019   Adenoma of right adrenal gland 01/16/2019   Coronary artery calcification seen on CAT scan 04/12/2018   Insomnia 01/01/2015   Fatty liver 01/01/2015   Adrenal mass, right Dr Erskine Squibb 03/13/2014   Thyroid nodule  managed Dr. Posey Pronto 02/27/2014   EtOH dependence ([place]) 11/22/2013   Bereavement 10/04/2013   Tobacco use 10/04/2013   MDD (major depressive disorder) 08/23/2013   Colon polyp 08/23/2013   Migraines 08/23/2013   GERD (gastroesophageal reflux disease)    Depression with anxiety    Type 2 diabetes mellitus with other specified complication ([place])    Gout    Hypertension    Hyperlipemia    Past Medical History:  Diagnosis Date   Acute bronchitis 08/02/2019   Adrenal adenoma    as  per MRI 03/09/14   Allergy    Anxiety    Coronary artery calcification seen on CAT scan 04/12/2018   Depression    Diabetes mellitus    Diverticulosis    Elevated LFTs    elevated since 09/2013   Fatty liver    moderate- as per 03/06/17 MRI liver   GERD (gastroesophageal reflux disease)    Gout    H/O hiatal hernia    Headache(784.0)    History of depression    Hyperlipemia    Hyperplastic colon polyp    Hypertension    Positive hepatitis C antibody test    RNA NEGATIVE   Renal artery stenosis ([place]) 08/02/2019   06/2019: 1-59% stenosis on L    Thyroid nodule  managed Dr. Posey Pronto 02/27/2014   Tobacco abuse    Tubular adenoma of colon     Family History  Problem Relation Age of Onset   Coronary artery disease Father 80       CAD,CABG   Hypertension Mother    Stroke Mother    Diabetes Sister        uncontrolled   Cancer Sister 45       uterine cancer   Aneurysm Sister        brain   Heart attack Maternal Grandmother    Blindness Paternal Grandmother    Breast cancer Neg Hx    Colon cancer Neg Hx    Esophageal cancer Neg Hx    Rectal cancer Neg Hx    Stomach cancer Neg Hx     Past Surgical History:  Procedure Laterality Date   ANAL FISTULECTOMY  01/19/1985   BREAST EXCISIONAL BIOPSY Left    BREAST LUMPECTOMY Left 1988   PAROTIDECTOMY  03/30/2011   Procedure: PAROTIDECTOMY;  Surgeon: Izora Gala, MD;  Location: [facility];  Service: ENT;  Laterality: Right;   WRIST GANGLION EXCISION Left 1988   Social History   Occupational History    Employer: [employer]  Tobacco Use   Smoking status: Every Day    Packs/day: 1.00    Years: 28.00    Pack years: 28.00    Types: Cigarettes   Smokeless tobacco: Never  Vaping Use   Vaping Use: Never used  Substance and Sexual Activity   Alcohol use: Yes    Alcohol/week: 14.0 standard drinks    Types: 14 Standard drinks or equivalent per week   Drug use: No   Sexual activity: Not Currently    Birth  control/protection: Post-menopausal

## 2021-01-23 ENCOUNTER — Telehealth: Payer: Self-pay | Admitting: Orthopaedic Surgery

## 2021-01-23 ENCOUNTER — Encounter (HOSPITAL_BASED_OUTPATIENT_CLINIC_OR_DEPARTMENT_OTHER): Payer: Self-pay

## 2021-01-23 ENCOUNTER — Ambulatory Visit (INDEPENDENT_AMBULATORY_CARE_PROVIDER_SITE_OTHER): Payer: Commercial Managed Care - PPO | Admitting: Surgery

## 2021-01-23 ENCOUNTER — Ambulatory Visit: Payer: Self-pay

## 2021-01-23 ENCOUNTER — Encounter: Payer: Self-pay | Admitting: Surgery

## 2021-01-23 ENCOUNTER — Other Ambulatory Visit: Payer: Self-pay | Admitting: Orthopaedic Surgery

## 2021-01-23 ENCOUNTER — Other Ambulatory Visit: Payer: Self-pay

## 2021-01-23 DIAGNOSIS — M659 Synovitis and tenosynovitis, unspecified: Secondary | ICD-10-CM

## 2021-01-23 DIAGNOSIS — M25561 Pain in right knee: Secondary | ICD-10-CM

## 2021-01-23 DIAGNOSIS — G8929 Other chronic pain: Secondary | ICD-10-CM | POA: Diagnosis not present

## 2021-01-23 MED ORDER — LIDOCAINE HCL 1 % IJ SOLN
3.0000 mL | INTRAMUSCULAR | Status: AC | PRN
  Administered 2021-01-23: 3 mL

## 2021-01-23 MED ORDER — ALLOPURINOL 300 MG PO TABS: 300.0000 mg | ORAL_TABLET | Freq: Every day | ORAL | 4 refills | Status: DC

## 2021-01-23 MED ORDER — METHYLPREDNISOLONE ACETATE 40 MG/ML IJ SUSP
40.0000 mg | INTRAMUSCULAR | Status: AC | PRN
  Administered 2021-01-23: 40 mg via INTRA_ARTICULAR

## 2021-01-23 MED ORDER — BUPIVACAINE HCL 0.25 % IJ SOLN
7.0000 mL | INTRAMUSCULAR | Status: AC | PRN
  Administered 2021-01-23: 7 mL via INTRA_ARTICULAR

## 2021-01-23 NOTE — Telephone Encounter (Signed)
Ok per Dr Lorin Mercy to see Stacy [name] and have him aspirate right knee. I called patient and advised. Also advised Dr. Lorin Mercy sent in Allopurinol 300mg  to her pharmacy. She is continuing to take this daily and has not missed a dose. Advised that he sent in the 300mg  instead of 100mg  this time.  Could you please call patient and schedule appt with Stacy [name] for this afternoon for right knee pain/swelling/aspiration per Dr. Lorin Mercy? Thanks.

## 2021-01-23 NOTE — Addendum Note (Signed)
Addended by: Precious Bard on: 01/23/2021 04:06 PM   Modules accepted: Orders

## 2021-01-23 NOTE — Telephone Encounter (Signed)
Pt called and states she has fluid on her knee. Wondering if someone can see her for it today since Lorin Mercy is not here.  CB [phone]

## 2021-01-23 NOTE — Progress Notes (Addendum)
Office Visit Note   Patient: Stacy [name]           Date of Birth: [birth year]           MRN: [id number] Visit Date: 01/23/2021              Requested by: No referring provider defined for this encounter. PCP: Pcp, No   Assessment & Plan: Visit Diagnoses:  1. Chronic pain of right knee   2. Synovitis of right knee     Plan: CBG in clinic today 97.  The patient consent right knee was prepped with Betadine and about 25 cc of serous slightly cloudy fluid was aspirated from a superior lateral patellar approach.  I then performed a intra-articular Marcaine/Depo-Medrol 7:1 injection.  I sent joint fluid off to the lab for routine analysis.  I also did blood work today to check a CBC and arthritis panel.  Patient is slightly elevated rheumatoid factor in the hospital.  Follow-up with me in 3 weeks to review blood work and see if rheumatology consult is indicated.  Patient will ice the knee off and on tonight.  Follow-Up Instructions: Return in about 3 weeks (around 02/13/2021) for with Stacy [name] recheck right knee and review labs.   Orders:  Orders Placed This Encounter  Procedures   XR KNEE 3 VIEW RIGHT   CBC with Differential   Antinuclear Antib (ANA)   Rheumatoid Factor   Uric acid   Sed Rate (ESR)   HLA-B27 antigen   No orders of the defined types were placed in this encounter.     Procedures: Large Joint Inj: R knee on 01/23/2021 2:46 PM Indications: pain Details: 22 G 1.5 in needle, superolateral approach Medications: 3 mL lidocaine 1 %; 7 mL bupivacaine 0.25 %; 40 mg methylPREDNISolone acetate 40 MG/ML Aspirate: 25 mL cloudy and serous Outcome: tolerated well, no immediate complications Consent was given by the patient. Patient was prepped and draped in the usual sterile fashion.      Clinical Data: No additional findings.   Subjective: Chief Complaint  Patient presents with   Right Knee - Pain    HPI 56 year old black female returns with complaints of right  knee pain and swelling.  She was seen by Dr. Lorin Mercy in the office December 27, 2020 and knee was aspirated.  Patient states that she did reasonly well after that but last night had sudden acute right knee pain and swelling.  Difficulty bending the knee and consider amount discomfort when she is up and weightbearing.  No injury.  Patient has a history of gout.  During recent hospital admission last month she did have a slightly elevated rheumatoid factor.  I do not see that she had a rheumatology eval. Review of Systems No current cardiac pulmonary GI GU  Objective: Vital Signs: There were no vitals taken for this visit.  Physical Exam HENT:     Head: Normocephalic.  Eyes:     Extraocular Movements: Extraocular movements intact.  Pulmonary:     Effort: No respiratory distress.  Musculoskeletal:     Comments: Gait is antalgic.  Pleasant female alert and oriented in no acute distress.  Right knee range of motion about 5 to 85 degrees.  Knee is diffusely tender.  No signs of infection.  Positive effusion.  Calf nontender.  Bilateral 2-3+ pretibial edema.  Bilateral calves are nontender.  Neurological:     Mental Status: She is alert.  Psychiatric:  Mood and Affect: Mood normal.    Ortho Exam  Specialty Comments:  No specialty comments available.  Imaging: No results found.   PMFS History: Patient Active Problem List   Diagnosis Date Noted   Synovitis of right knee 12/28/2020   Synovitis of left knee    AKI (acute kidney injury) ([place]) 12/20/2020   Acute renal failure ([place]) 12/19/2020   Leukocytosis 12/19/2020   Increased anion gap metabolic acidosis 73/42/8768   Anemia 12/19/2020   Renal artery stenosis ([facility]) 08/02/2019   Acute bronchitis 08/02/2019   Adenoma of right adrenal gland 01/16/2019   Coronary artery calcification seen on CAT scan 04/12/2018   Insomnia 01/01/2015   Fatty liver 01/01/2015   Adrenal mass, right Dr Erskine Squibb 03/13/2014   Thyroid nodule   managed Dr. Posey Pronto 02/27/2014   EtOH dependence ([place]) 11/22/2013   Bereavement 10/04/2013   Tobacco use 10/04/2013   MDD (major depressive disorder) 08/23/2013   Colon polyp 08/23/2013   Migraines 08/23/2013   GERD (gastroesophageal reflux disease)    Depression with anxiety    Type 2 diabetes mellitus with other specified complication ([place])    Gout    Hypertension    Hyperlipemia    Past Medical History:  Diagnosis Date   Acute bronchitis 08/02/2019   Adrenal adenoma    as per MRI 03/09/14   Allergy    Anxiety    Coronary artery calcification seen on CAT scan 04/12/2018   Depression    Diabetes mellitus    Diverticulosis    Elevated LFTs    elevated since 09/2013   Fatty liver    moderate- as per 03/06/17 MRI liver   GERD (gastroesophageal reflux disease)    Gout    H/O hiatal hernia    Headache(784.0)    History of depression    Hyperlipemia    Hyperplastic colon polyp    Hypertension    Positive hepatitis C antibody test    RNA NEGATIVE   Renal artery stenosis ([place]) 08/02/2019   06/2019: 1-59% stenosis on L    Thyroid nodule  managed Dr. Posey Pronto 02/27/2014   Tobacco abuse    Tubular adenoma of colon     Family History  Problem Relation Age of Onset   Coronary artery disease Father 62       CAD,CABG   Hypertension Mother    Stroke Mother    Diabetes Sister        uncontrolled   Cancer Sister 29       uterine cancer   Aneurysm Sister        brain   Heart attack Maternal Grandmother    Blindness Paternal Grandmother    Breast cancer Neg Hx    Colon cancer Neg Hx    Esophageal cancer Neg Hx    Rectal cancer Neg Hx    Stomach cancer Neg Hx     Past Surgical History:  Procedure Laterality Date   ANAL FISTULECTOMY  01/19/1985   BREAST EXCISIONAL BIOPSY Left    BREAST LUMPECTOMY Left 1988   PAROTIDECTOMY  03/30/2011   Procedure: PAROTIDECTOMY;  Surgeon: Izora Gala, MD;  Location: [facility];  Service: ENT;  Laterality: Right;   WRIST GANGLION  EXCISION Left 1988   Social History   Occupational History    Employer: [employer]  Tobacco Use   Smoking status: Every Day    Packs/day: 1.00    Years: 28.00    Pack years: 28.00  Types: Cigarettes   Smokeless tobacco: Never  Vaping Use   Vaping Use: Never used  Substance and Sexual Activity   Alcohol use: Yes    Alcohol/week: 14.0 standard drinks    Types: 14 Standard drinks or equivalent per week   Drug use: No   Sexual activity: Not Currently    Birth control/protection: Post-menopausal

## 2021-01-24 ENCOUNTER — Encounter (HOSPITAL_BASED_OUTPATIENT_CLINIC_OR_DEPARTMENT_OTHER): Payer: Self-pay | Admitting: Nurse Practitioner

## 2021-01-24 ENCOUNTER — Telehealth: Payer: Self-pay | Admitting: Radiology

## 2021-01-24 ENCOUNTER — Ambulatory Visit (INDEPENDENT_AMBULATORY_CARE_PROVIDER_SITE_OTHER): Payer: Commercial Managed Care - PPO | Admitting: Nurse Practitioner

## 2021-01-24 ENCOUNTER — Telehealth (HOSPITAL_BASED_OUTPATIENT_CLINIC_OR_DEPARTMENT_OTHER): Payer: Self-pay | Admitting: *Deleted

## 2021-01-24 VITALS — BP 150/80 | HR 82 | Ht 63.0 in | Wt 149.4 lb

## 2021-01-24 DIAGNOSIS — R600 Localized edema: Secondary | ICD-10-CM

## 2021-01-24 DIAGNOSIS — G479 Sleep disorder, unspecified: Secondary | ICD-10-CM

## 2021-01-24 DIAGNOSIS — Z720 Tobacco use: Secondary | ICD-10-CM

## 2021-01-24 DIAGNOSIS — R7401 Elevation of levels of liver transaminase levels: Secondary | ICD-10-CM

## 2021-01-24 DIAGNOSIS — I2510 Atherosclerotic heart disease of native coronary artery without angina pectoris: Secondary | ICD-10-CM

## 2021-01-24 DIAGNOSIS — I10 Essential (primary) hypertension: Secondary | ICD-10-CM | POA: Diagnosis not present

## 2021-01-24 DIAGNOSIS — E785 Hyperlipidemia, unspecified: Secondary | ICD-10-CM

## 2021-01-24 MED ORDER — SPIRONOLACTONE 25 MG PO TABS: 25.0000 mg | ORAL_TABLET | Freq: Every day | ORAL | 3 refills | Status: DC

## 2021-01-24 MED ORDER — CLONIDINE HCL 0.2 MG PO TABS: 0.2000 mg | ORAL_TABLET | Freq: Two times a day (BID) | ORAL | 3 refills | Status: DC

## 2021-01-24 NOTE — Telephone Encounter (Signed)
-----   Message from Emmaline Life, NP sent at 01/24/2021 12:34 PM EST ----- Hey,  I don't want this pt to get lost in f/u but I did not schedule her today bc she has missed a lot of work and has a lot of upcoming appts. She was hospitalized with acute renal failure (creat was 9!) but it has recovered. All anti-hypertensives were stopped but someone added back clonidine. I restarted spiro today. She wants Dr. Oval Linsey to manage her HTN, so could you check on her in a month or so and then schedule an appropriate appt?  Thank you! Sharyn Lull

## 2021-01-24 NOTE — Telephone Encounter (Signed)
I left voicemail requesting return call from patient to find out how she is doing, if knee is still considerably painful, or if it is better after aspiration yesterday. Jeneen Rinks would like to be called once patient lets [place] know how she is. If continued problems, he may order MRI.

## 2021-01-24 NOTE — Patient Instructions (Signed)
Medication Instructions:  Your physician has recommended you make the following change in your medication:   Change: Please reduce Clonidine (Catapres) 0.2mg  tablet from three times daily to twice daily   Start: Spironolactone 25mg  tablet daily    *If you need a refill on your cardiac medications before your next appointment, please call your pharmacy*   Lab Work: Your physician recommends that you return for lab work in One week for [employer] with your Employer Nurse   If you have labs (blood work) drawn today and your tests are completely normal, you will receive your results only by: [place] (if you have MyChart) OR A paper copy in the mail If you have any lab test that is abnormal or we need to change your treatment, we will call you to review the results.   Testing/Procedures: None ordered today    Follow-Up: At [facility], you and your health needs are our priority.  As part of our continuing mission to provide you with exceptional heart care, we have created designated Provider Care Teams.  These Care Teams include your primary Cardiologist (physician) and Advanced Practice Providers (APPs -  Physician Assistants and Nurse Practitioners) who all work together to provide you with the care you need, when you need it.  We recommend signing up for the patient portal called "MyChart".  Sign up information is provided on this After Visit Summary.  MyChart is used to connect with patients for Virtual Visits (Telemedicine).  Patients are able to view lab/test results, encounter notes, upcoming appointments, etc.  Non-urgent messages can be sent to your provider as well.   To learn more about what you can do with MyChart, go to [email or web address].    Your next appointment:   Follow Up as Scheduled with Pharmacist

## 2021-01-24 NOTE — Progress Notes (Signed)
Cardiology Office Note:    Date:  01/24/2021   ID:  Stacy [name], DOB 03/31/1965, MRN [id number]  PCP:  [name], MD   [facility] Cardiologist:  Skeet Latch, MD     Referring MD: No ref. provider found   Chief Complaint: hospital f/u for hypertension  History of Present Illness:    Stacy [name] is a 56 y.o. female with a hx of HTN, hyperlipidemia, GERD, T2DM, mild nonobstructive CAD, tobacco abuse, renal stenosis, normocytic anemia, and fatty liver.  She was found to have mild to moderate calcified plaque in the thoracic aortic and LAD by CT. She later underwent a coronary CT for exertional dyspnea and was found to have mild plaque in the LAD and minimal plaque in the LCx. She had mild elevation in LFTs on atorvastatin but they improved upon recheck so she was continued on statin therapy. She was last seen by Dr. Oval Linsey on 03/20/20 via telemedicine. A 6 mo f/u was recommended.   She presented to the [facility] ED on 12/19/20 with abdominal pain and knee pain. She was found to have acute renal failure with severely elevated creatinine at 9.5 as well as electrolyte imbalance. She reported a 3 day hx of n/v and poor po intake prior to arrival to the ED. She was treating herself for a gout flare in her knees with ibuprofen and colchicine. During admission, her renal u/s was negative for hydronephrosis but revealed renal artery stenosis 1-59% of left renal 3, no evidence of rt renal stenosis. She was treated by ortho for gout flare with steroid injection and oral prednisone and was advised to avoid colchicine and NSAIDs. Lipase was mildly elevated and troponin was negative. She was discharged on 12/22/21 and advised to f/u with cardiology.   Today, she is here alone for hospital follow-up.  She reports her blood pressure readings have been quite high high at home with diastolic readings greater than 100 mmHg at times.  She denies chest pain, shortness of breath, fatigue,  palpitations, melena, hematuria, hemoptysis, diaphoresis, weakness, presyncope, syncope, orthopnea, and PND.  She has bilateral 1+ pitting edema in her lower extremities. She is having a lot of knee pain related to recent gout flare and is being followed by orthopedics for this.  She has been unable to resume colchicine or ibuprofen and continues to have significant pain.  She reports she is not sleeping well and she is weepy during her office visit.  States she has been this way for a little while and thinks it is related to pain and depression.  Is excited that 1 of her sons has moved back to [place].   Past Medical History:  Diagnosis Date   Acute bronchitis 08/02/2019   Adrenal adenoma    as per MRI 03/09/14   Allergy    Anxiety    Coronary artery calcification seen on CAT scan 04/12/2018   Depression    Diabetes mellitus    Diverticulosis    Elevated LFTs    elevated since 09/2013   Fatty liver    moderate- as per 03/06/17 MRI liver   GERD (gastroesophageal reflux disease)    Gout    H/O hiatal hernia    Headache(784.0)    History of depression    Hyperlipemia    Hyperplastic colon polyp    Hypertension    Positive hepatitis C antibody test    RNA NEGATIVE   Renal artery stenosis ([place]) 08/02/2019   06/2019: 1-59%  stenosis on L    Thyroid nodule  managed Dr. Posey Pronto 02/27/2014   Tobacco abuse    Tubular adenoma of colon     Past Surgical History:  Procedure Laterality Date   ANAL FISTULECTOMY  01/19/1985   BREAST EXCISIONAL BIOPSY Left    BREAST LUMPECTOMY Left 1988   PAROTIDECTOMY  03/30/2011   Procedure: PAROTIDECTOMY;  Surgeon: Izora Gala, MD;  Location: [facility];  Service: ENT;  Laterality: Right;   WRIST GANGLION EXCISION Left 1988    Current Medications: Current Meds  Medication Sig   albuterol (VENTOLIN HFA) 108 (90 Base) MCG/ACT inhaler Inhale 2 puffs into the lungs every 4 (four) hours as needed for wheezing or shortness of breath.    allopurinol (ZYLOPRIM) 300 MG tablet Take 1 tablet (300 mg total) by mouth daily.   EPINEPHrine 0.3 mg/0.3 mL IJ SOAJ injection Insert into muscle once with any facial or neck swelling  Instruct in use   glimepiride (AMARYL) 1 MG tablet TAKE 1 TABLET BY MOUTH DAILY BEFORE SUPPER.   glucose blood test strip Use as instructed to monitor FSBS 3x daily. Dx: E11.65   HYDROcodone-acetaminophen (NORCO/VICODIN) 5-325 MG tablet Take 1 tablet by mouth every 6 (six) hours as needed for moderate pain.   Lancets MISC Use as instructed to monitor FSBS 3x daily. Dx: E11.65   mirtazapine (REMERON) 15 MG tablet TAKE 1/2 TABLET BY MOUTH AT BEDTIME   Multiple Vitamins-Minerals (ONE-A-DAY WOMENS VITACRAVES) CHEW Chew 1 each by mouth daily.   pioglitazone (ACTOS) 15 MG tablet Take 1 tablet (15 mg total) by mouth daily. Need follow up appointment   spironolactone (ALDACTONE) 25 MG tablet Take 1 tablet (25 mg total) by mouth daily.   SUMAtriptan (IMITREX) 100 MG tablet Take 1 tablet (100 mg total) by mouth every 2 (two) hours as needed for migraine. May repeat in 2 hours if headache persists or recurs.   [DISCONTINUED] cloNIDine (CATAPRES) 0.2 MG tablet Take 0.2 mg by mouth 3 (three) times daily.     Allergies:   Almond meal, Other, and Aspirin   Social History   Socioeconomic History   Marital status: Widowed    Spouse name: Not on file   Number of children: 2   Years of education: Not on file   Highest education level: Not on file  Occupational History    Employer: [employer]  Tobacco Use   Smoking status: Every Day    Packs/day: 1.00    Years: 28.00    Pack years: 28.00    Types: Cigarettes   Smokeless tobacco: Never  Vaping Use   Vaping Use: Never used  Substance and Sexual Activity   Alcohol use: Yes    Alcohol/week: 14.0 standard drinks    Types: 14 Standard drinks or equivalent per week   Drug use: No   Sexual activity: Not Currently    Birth control/protection:  Post-menopausal  Other Topics Concern   Not on file  Social History Narrative   Married. 2 children.    Ages 33, 26 y/o ( entered 09/2012)      Works with [employer]   Social Determinants of Health   Financial Resource Strain: Not on file  Food Insecurity: Not on file  Transportation Needs: Not on file  Physical Activity: Not on file  Stress: Not on file  Social Connections: Not on file     Family History: The patient's family history includes Aneurysm in her sister; Blindness in her  paternal grandmother; Cancer (age of onset: 53) in her sister; Coronary artery disease (age of onset: 35) in her father; Diabetes in her sister; Heart attack in her maternal grandmother; Hypertension in her mother; Stroke in her mother. There is no history of Breast cancer, Colon cancer, Esophageal cancer, Rectal cancer, or Stomach cancer.  ROS:   Please see the history of present illness.    +right knee swelling +bilateral lower extremity edema All other systems reviewed and are negative.  Labs/Other Studies Reviewed:    The following studies were reviewed today:  Vas US Renal Artery Bilateral 12/19/20  Right: Normal size right kidney. Normal right Resisitive Index. No         evidence of right renal artery stenosis. RRV flow present.  Left:  Normal size of left kidney. Normal left Resistive Index.         1-59% stenosis of the left renal artery. LRV flow present.  Mesenteric:  Normal Celiac artery findings.    Cor CT 03/29/19  IMPRESSION: LAD is a large vessel with mild (25-49%) calcified plaque in the proximal portion. The mid and distal portion with no plaque. D1 small vessel with no plaque. LCX is a non-dominant artery that gives rise to one large OM1 branch. There is minimal (1-24%) calcified plaque in the proximal portion of the LCX. The mid and distal portion with no plaque. There is no plaque in the OM1 branch. 1. Coronary calcium score of 50.4. This was 30  percentile for age and sex matched control. 2. Normal coronary origin with right dominance. 3. Mild Coronary artery disease. CADRADS 2. Recommend medical therapy. 4. Mild calcification of the aortic root.   Recent Labs: 12/22/2020: Hemoglobin 9.0; Magnesium 2.2; Platelets 405 12/27/2020: ALT 87; BUN 22; Creat 0.93; Potassium 4.2; Sodium 142  Recent Lipid Panel    Component Value Date/Time   CHOL 208 (H) 08/02/2019 0955   TRIG 230 (A) 06/19/2020 0000   HDL 42 06/19/2020 0000   HDL 63 08/02/2019 0955   CHOLHDL 3.3 08/02/2019 0955   CHOLHDL 3 01/10/2019 0918   VLDL 28.8 01/10/2019 0918   LDLCALC 75 06/19/2020 0000   LDLCALC 127 (H) 08/02/2019 0955     Physical Exam:    VS:  BP (!) 150/80    Pulse 82    Ht 5\' 3"  (1.6 m)    Wt 149 lb 6.4 oz (67.8 kg)    SpO2 99%    BMI 26.47 kg/m     Wt Readings from Last 3 Encounters:  01/24/21 149 lb 6.4 oz (67.8 kg)  12/27/20 145 lb 12.8 oz (66.1 kg)  12/21/20 145 lb 4.5 oz (65.9 kg)     GEN:  Well nourished, well developed in no acute distress HEENT: Normal NECK: No JVD; No carotid bruits LYMPHATICS: No lymphadenopathy CARDIAC: RRR, no murmurs, rubs, gallops RESPIRATORY:  Clear to auscultation without rales, wheezing or rhonchi  ABDOMEN: Soft, non-tender, non-distended MUSCULOSKELETAL:  1+ pitting edema bilateral lower extremities; No deformity  SKIN: Warm and dry NEUROLOGIC:  Alert and oriented x 3 PSYCHIATRIC:  Normal affect   EKG:  EKG is ordered today.  The ekg ordered today demonstrates NSR at rate of 82 bpm, no ST/T wave abnormality  Diagnoses:    1. Essential hypertension   2. Coronary artery disease involving native coronary artery of native heart without angina pectoris   3. Hyperlipidemia LDL goal <70   4. Bilateral leg edema   5. Difficulty sleeping   6. Elevated transaminase  level   7. Tobacco abuse    Assessment and Plan:     Essential hypertension: Medications were held during hospitalization 12/22 due to  acute renal failure and hypotension. She was restarted on clonidine 0.2 mg 3 times a day.  Initial clinic blood pressure is elevated, however on my recheck it has actually decreased to 100/64.  She reports high blood pressure readings at home.  She would like to take as few pills as possible and would like something that may help with lower extremity edema.  We will restart spironolactone 25 mg once daily and reduce clonidine to 0.2 mg twice daily.  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  We will plan to follow-up on her blood pressure in 3 to 6 months depending on her availability.  She reports she currently is missing a lot of work due to multiple appointments and recent hospitalization.  Mild nonobstructive CAD: Coronary CT March 2021 revealed mild disease in the LAD and LCx.  Her coronary calcium score is 50.4, 94th percentile for age/sex, mild calcification of the aortic root.  Medical therapy recommended.  She denies chest pain, dyspnea or other symptoms concerning for angina.  No further ischemic evaluation warranted at this time.  Her statin was stopped during hospitalization 12/22 due to elevated transaminase.  This was also a problem in the past for her.  We will refer her to lipid clinic for management of lipids.  Aspirin was stopped during hospitalization due to anemia.  Would favor restarting aspirin 81 mg at next visit if blood counts have stabilized.  Hyperlipidemia LDL goal < 70: LDL 75 6/22.  She was previously on atorvastatin 40 mg.  As noted above statin was stopped during recent hospitalization due to elevated transaminase.  We will refer her to lipid clinic for management.  Bilateral leg edema: She has 1+ pitting edema in both lower extremities.  She is having a significant amount of right knee swelling and pain due to gout.  She requests a blood pressure medicine that may help reduce some of the swelling.  We  will restart spironolactone 25 mg as noted above she wants to take as few pills as possible and this is a potassium sparing diuretic.  Advised that she may not notice significant improvement in joint swelling with spironolactone.  Encouraged leg elevation (toes to nose) to help decrease leg swelling.  Difficulty sleeping: She states she is having difficulty sleeping because her mind is racing and she is having knee pain and requests a prescription for Ambien. States melatonin does not work for her. I encouraged her to use nonpharmacologic methods to help her sleep and suggested the Calm app or other methods that may help her calm her mind before going to sleep.  Encouraged leg elevation to help with pain and swelling.  Tobacco abuse: She continues to smoke less than 1/2 packs/day. Has reduced daytime smoking. Feels that her son who has recently moved back to [place] may help her quit because he is a big encouragement. Complete cessation advised.    Disposition: 3 to 6 months with Dr. Oval Linsey      Medication Adjustments/Labs and Tests Ordered: Current medicines are reviewed at length with the patient today.  Concerns regarding medicines are outlined above.  Orders Placed This Encounter  Procedures   Basic metabolic panel   AMB Referral to [facility] Pharm-D   EKG 12-Lead  Meds ordered this encounter  Medications   spironolactone (ALDACTONE) 25 MG tablet    Sig: Take 1 tablet (25 mg total) by mouth daily.    Dispense:  90 tablet    Refill:  3   cloNIDine (CATAPRES) 0.2 MG tablet    Sig: Take 1 tablet (0.2 mg total) by mouth 2 (two) times daily.    Dispense:  180 tablet    Refill:  3    Patient Instructions  Medication Instructions:  Your physician has recommended you make the following change in your medication:   Change: Please reduce Clonidine (Catapres) 0.2mg  tablet from three times daily to twice daily   Start: Spironolactone 25mg  tablet daily    *If you need a refill  on your cardiac medications before your next appointment, please call your pharmacy*   Lab Work: Your physician recommends that you return for lab work in One week for [employer] with your Employer Nurse   If you have labs (blood work) drawn today and your tests are completely normal, you will receive your results only by: [place] (if you have MyChart) OR A paper copy in the mail If you have any lab test that is abnormal or we need to change your treatment, we will call you to review the results.   Testing/Procedures: None ordered today    Follow-Up: At [facility], you and your health needs are our priority.  As part of our continuing mission to provide you with exceptional heart care, we have created designated Provider Care Teams.  These Care Teams include your primary Cardiologist (physician) and Advanced Practice Providers (APPs -  Physician Assistants and Nurse Practitioners) who all work together to provide you with the care you need, when you need it.  We recommend signing up for the patient portal called "MyChart".  Sign up information is provided on this After Visit Summary.  MyChart is used to connect with patients for Virtual Visits (Telemedicine).  Patients are able to view lab/test results, encounter notes, upcoming appointments, etc.  Non-urgent messages can be sent to your provider as well.   To learn more about what you can do with MyChart, go to [email or web address].    Your next appointment:   Follow Up as Scheduled with Pharmacist      Signed, Emmaline Life, NP  01/24/2021 12:23 PM    [place]

## 2021-01-25 ENCOUNTER — Other Ambulatory Visit: Payer: Self-pay | Admitting: Surgical

## 2021-01-25 MED ORDER — HYDROCODONE-ACETAMINOPHEN 5-325 MG PO TABS: 1.0000 | ORAL_TABLET | Freq: Four times a day (QID) | ORAL | 0 refills | Status: DC | PRN

## 2021-01-26 ENCOUNTER — Other Ambulatory Visit: Payer: Self-pay | Admitting: Surgical

## 2021-01-26 ENCOUNTER — Telehealth: Payer: Self-pay | Admitting: Surgical

## 2021-01-26 MED ORDER — PREDNISONE 5 MG (21) PO TBPK: ORAL_TABLET | ORAL | 0 refills | Status: DC

## 2021-01-26 NOTE — Telephone Encounter (Signed)
Patient called on-call number on 01/25/2021.  She has had progressively worsening knee pain.  Had aspiration and injection by Benjiman Core, PA-C recently without relief for even several hours.  She complains of continued swelling and knee pain with difficulty bearing weight.  Denies any fevers, chills, night sweats, history of prior surgery or infection to the knee.  She is elevating and taking Tylenol and also try the hydrocodone that was prescribed on 01/25/2021 without much relief.  She called the office on-call again on 01/26/2021 and requested aspiration.  With her difficulty bearing weight and inability to control pain with other conservative measures, patient was met in the office and the right knee was aspirated of 30 cc of cloudy blood-tinged fluid with some debris in the aspiration tube.  With the appearance of the fluid, I called [facility] who reported over the phone that her previous aspiration had cell count of 3700 with neutrophil percentage of 51% and positive monosodium urate crystals.  Her uric acid level was 6.0 and ESR was 55.  Blood work is overall reassuring against septic arthritis concurrently with gout so plan to continue treating as gout flare.  Prescribe lower dose Medrol Dosepak and patient will check her blood glucose every morning to ensure no severe hyperglycemia.  Last A1c 7.0.  Blood glucose 128 this morning.  Instructed her to call the office tomorrow morning to inform Jeneen Rinks or Dr. Lorin Mercy about how she is feeling and decide whether or not to proceed with any further intervention.

## 2021-01-27 ENCOUNTER — Encounter: Payer: Self-pay | Admitting: Orthopaedic Surgery

## 2021-01-27 DIAGNOSIS — G8929 Other chronic pain: Secondary | ICD-10-CM

## 2021-01-27 DIAGNOSIS — M659 Synovitis and tenosynovitis, unspecified: Secondary | ICD-10-CM

## 2021-01-27 NOTE — Telephone Encounter (Signed)
FYI  Patient is still having considerable trouble with her knee. MRI was ordered.

## 2021-01-28 LAB — CBC WITH DIFFERENTIAL/PLATELET
Absolute Monocytes: 742 cells/uL (ref 200–950)
Basophils Absolute: 46 cells/uL (ref 0–200)
Basophils Relative: 0.4 %
Eosinophils Absolute: 220 cells/uL (ref 15–500)
Eosinophils Relative: 1.9 %
HCT: 32.2 % — ABNORMAL LOW (ref 35.0–45.0)
Hemoglobin: 9.8 g/dL — ABNORMAL LOW (ref 11.7–15.5)
Lymphs Abs: 2575 cells/uL (ref 850–3900)
MCH: 22.3 pg — ABNORMAL LOW (ref 27.0–33.0)
MCHC: 30.4 g/dL — ABNORMAL LOW (ref 32.0–36.0)
MCV: 73.2 fL — ABNORMAL LOW (ref 80.0–100.0)
MPV: 9.7 fL (ref 7.5–12.5)
Monocytes Relative: 6.4 %
Neutro Abs: 8016 cells/uL — ABNORMAL HIGH (ref 1500–7800)
Neutrophils Relative %: 69.1 %
Platelets: 699 10*3/uL — ABNORMAL HIGH (ref 140–400)
RBC: 4.4 10*6/uL (ref 3.80–5.10)
RDW: 20.2 % — ABNORMAL HIGH (ref 11.0–15.0)
Total Lymphocyte: 22.2 %
WBC: 11.6 10*3/uL — ABNORMAL HIGH (ref 3.8–10.8)

## 2021-01-28 LAB — HLA-B27 ANTIGEN: HLA-B27 Antigen: NEGATIVE

## 2021-01-28 LAB — ANA: Anti Nuclear Antibody (ANA): NEGATIVE

## 2021-01-28 LAB — SEDIMENTATION RATE: Sed Rate: 55 mm/h — ABNORMAL HIGH (ref 0–30)

## 2021-01-28 LAB — RHEUMATOID FACTOR: Rheumatoid fact SerPl-aCnc: <14 IU/mL (ref <14)

## 2021-01-28 LAB — URIC ACID: Uric Acid, Serum: 6 mg/dL (ref 2.5–7.0)

## 2021-01-30 ENCOUNTER — Other Ambulatory Visit: Payer: Self-pay | Admitting: Orthopaedic Surgery

## 2021-01-30 MED ORDER — HYDROCODONE-ACETAMINOPHEN 5-325 MG PO TABS
1.0000 | ORAL_TABLET | Freq: Four times a day (QID) | ORAL | 0 refills | Status: DC | PRN
Start: 2021-01-30 — End: 2021-02-10

## 2021-01-31 LAB — ANAEROBIC AND AEROBIC CULTURE
AER RESULT:: NO GROWTH
GRAM STAIN:: NONE SEEN
MICRO NUMBER:: 12831946
MICRO NUMBER:: 12831947
SPECIMEN QUALITY:: ADEQUATE
SPECIMEN QUALITY:: ADEQUATE

## 2021-01-31 LAB — SYNOVIAL FLUID ANALYSIS, COMPLETE
Basophils, %: 0 %
Eosinophils-Synovial: 0 % (ref 0–2)
Lymphocytes-Synovial Fld: 39 % (ref 0–74)
Monocyte/Macrophage: 10 % (ref 0–69)
Neutrophil, Synovial: 51 % — ABNORMAL HIGH (ref 0–24)
Synoviocytes, %: 0 % (ref 0–15)
WBC, Synovial: 3473 cells/uL — ABNORMAL HIGH (ref <150)

## 2021-01-31 NOTE — Telephone Encounter (Signed)
Please see message from [place] below. Thanks.

## 2021-01-31 NOTE — Telephone Encounter (Signed)
We are still waiting on her insurance to approve.

## 2021-02-03 NOTE — Progress Notes (Deleted)
New Patient Office Visit  Subjective:  Patient ID: Stacy [name], female    DOB: 1965-08-22  Age: 56 y.o. MRN: [id number]  CC: No chief complaint on file.   HPI Shaneequa R Santee presents for   [employer], pcv a1c flu pap eye foot  Former [facility] pt  Admit date: 12/19/2020 Discharge date: 12/22/2020   Admitted From: Home Disposition: Home   Recommendations for Outpatient Follow-up:  Follow up with PCP in 1 week with repeat CBC/BMP Outpatient follow-up with nephrology if needed Outpatient follow-up with orthopedics/Dr. Lorin Mercy Follow up in ED if symptoms worsen or new appear     Home Health: No Equipment/Devices: None   Discharge Condition: Stable CODE STATUS: Full Diet recommendation: Heart healthy/carb modified   Brief/Interim Summary: 56 y.o. female with medical history significant of HTN, HLD, GERD, T2DM, depression with anxiety, gout, nonobstructive CAD, left renal artery stenosis, tobacco abuse treated with abdominal pain, nausea and vomiting and not feeling well along with bilateral knee pain.  On presentation, she was slightly hypotensive, tachycardic.  WBC of 12.4; BUN of 77, creatinine of 9.5, lipase of 62.  Chest x-ray showed no acute findings.  Ultrasound of abdomen showed no acute findings to explain her abdominal pain.  She was started on IV fluids.  Nephrology was consulted.  During the hospitalization, her condition has gradually improved; creatinine is improving.  Orthopedics was consulted and patient underwent bilateral knee arthrocentesis and steroid injection; synovial fluid was consistent with gout flare.  She was also treated with oral prednisone.  Currently her renal function has much improved, nephrology has cleared the patient for discharge.  Currently she is hemodynamically stable, tolerating diet.  She will be discharged home today on oral prednisone and allopurinol will be started.  Follow-up with PCP and orthopedics.  Follow-up with nephrology if  needed.   Discharge Diagnoses:    Acute kidney injury/acute renal failure Anion gap metabolic acidosis -Possibly from ATN from dehydration and hypotension -Presented with creatinine of 9.5; treated with IV fluids.  Creatinine much improved to 1.57 today.  - Renal ultrasound negative for hydronephrosis  -nephrology following and has cleared the patient for discharge.  Outpatient follow-up with nephrology if needed. -Outpatient follow-up of BMP by PCP   Possible UTI -Present on admission.  Treated with 3 days of Rocephin.  No need for any further antibiotics on discharge.  Currently afebrile.   Bilateral knee pain and swelling/?  Acute gout flare -Patient states that she has had similar symptoms in the past requiring arthrocentesis which was consistent with acute gout.   - Avoid colchicine because of recent nausea and vomiting -Status post bilateral arthrocentesis by orthopedics on 12/20/2020: Synovial fluid analysis consistent with acute gout.  Bilateral knees were injected with steroids per orthopedics on 12/20/2020. -Continue oral prednisone on discharge 40 mg daily for 7 days.  Outpatient follow-up with orthopedics.     Nausea/vomiting -Questionable cause.  Ultrasound abdomen was negative for acute cause/gallbladder disease. -Much improved.  Currently tolerating diet.   Right upper quadrant pain/shoulder pain -Might be musculoskeletal as well.  Lipase only mildly elevated.  Troponin negative.  Continue current pain management.   Hypomagnesemia -Resolved   Mild hyperkalemia -Resolved   Leukocytosis -Possibly reactive.  Resolved   Renal artery stenosis -Renal duplex showed 1 to 59% stenosis of left renal 3.;  No evidence of right renal artery stenosis.   Normocytic anemia -Questionable cause.  Hemoglobin stable.  Outpatient follow-up   Fatty liver  Mildly elevated AST -  Possibly from alcohol use.  Outpatient follow-up with GI.  Abstain from alcohol   Hypertension -Blood  pressure on the lower side.  Antihypertensives on hold   Hyperlipidemia -Statin on hold because of slightly elevated LFTs.  Outpatient follow-up with PCP.   GERD -Has completed Protonix treatment as an outpatient.  Follow-up with PCP.   Depression with anxiety -Stable.  Continue Remeron   Adrenal mass, right -Has outpatient follow-up   Tobacco use -Continue nicotine patch.  Patient was counseled regarding cessation by admitting hospitalist  Cards 1/6    Essential hypertension: Medications were held during hospitalization 12/22 due to acute renal failure and hypotension. She was restarted on clonidine 0.2 mg 3 times a day.  Initial clinic blood pressure is elevated, however on my recheck it has actually decreased to 100/64.  She reports high blood pressure readings at home.  She would like to take as few pills as possible and would like something that may help with lower extremity edema.  We will restart spironolactone 25 mg once daily and reduce clonidine to 0.2 mg twice daily.  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  We will plan to follow-up on her blood pressure in 3 to 6 months depending on her availability.  She reports she currently is missing a lot of work due to multiple appointments and recent hospitalization.   Mild nonobstructive CAD: Coronary CT March 2021 revealed mild disease in the LAD and LCx.  Her coronary calcium score is 50.4, 94th percentile for age/sex, mild calcification of the aortic root.  Medical therapy recommended.  She denies chest pain, dyspnea or other symptoms concerning for angina.  No further ischemic evaluation warranted at this time.  Her statin was stopped during hospitalization 12/22 due to elevated transaminase.  This was also a problem in the past for her.  We will refer her to lipid clinic for management of lipids.  Aspirin was stopped during hospitalization due to anemia.  Would  favor restarting aspirin 81 mg at next visit if blood counts have stabilized.   Hyperlipidemia LDL goal < 70: LDL 75 6/22.  She was previously on atorvastatin 40 mg.  As noted above statin was stopped during recent hospitalization due to elevated transaminase.  We will refer her to lipid clinic for management.   Bilateral leg edema: She has 1+ pitting edema in both lower extremities.  She is having a significant amount of right knee swelling and pain due to gout.  She requests a blood pressure medicine that may help reduce some of the swelling.  We will restart spironolactone 25 mg as noted above she wants to take as few pills as possible and this is a potassium sparing diuretic.  Advised that she may not notice significant improvement in joint swelling with spironolactone.  Encouraged leg elevation (toes to nose) to help decrease leg swelling.   Difficulty sleeping: She states she is having difficulty sleeping because her mind is racing and she is having knee pain and requests a prescription for Ambien. States melatonin does not work for her. I encouraged her to use nonpharmacologic methods to help her sleep and suggested the Calm app or other methods that may help her calm her mind before going to sleep.  Encouraged leg elevation to help with pain and swelling.   Tobacco abuse: She continues to smoke less than 1/2 packs/day. Has reduced daytime smoking. Feels that her son  who has recently moved back to [place] may help her quit because he is a big encouragement. Complete cessation advised.    Past Medical History:  Diagnosis Date   Acute bronchitis 08/02/2019   Adrenal adenoma    as per MRI 03/09/14   Allergy    Anxiety    Coronary artery calcification seen on CAT scan 04/12/2018   Depression    Diabetes mellitus    Diverticulosis    Elevated LFTs    elevated since 09/2013   Fatty liver    moderate- as per 03/06/17 MRI liver   GERD (gastroesophageal reflux disease)    Gout    H/O  hiatal hernia    Headache(784.0)    History of depression    Hyperlipemia    Hyperplastic colon polyp    Hypertension    Positive hepatitis C antibody test    RNA NEGATIVE   Renal artery stenosis ([place]) 08/02/2019   06/2019: 1-59% stenosis on L    Thyroid nodule  managed Dr. Posey Pronto 02/27/2014   Tobacco abuse    Tubular adenoma of colon     Past Surgical History:  Procedure Laterality Date   ANAL FISTULECTOMY  01/19/1985   BREAST EXCISIONAL BIOPSY Left    BREAST LUMPECTOMY Left 1988   PAROTIDECTOMY  03/30/2011   Procedure: PAROTIDECTOMY;  Surgeon: Izora Gala, MD;  Location: [facility];  Service: ENT;  Laterality: Right;   WRIST GANGLION EXCISION Left 1988    Family History  Problem Relation Age of Onset   Coronary artery disease Father 28       CAD,CABG   Hypertension Mother    Stroke Mother    Diabetes Sister        uncontrolled   Cancer Sister 106       uterine cancer   Aneurysm Sister        brain   Heart attack Maternal Grandmother    Blindness Paternal Grandmother    Breast cancer Neg Hx    Colon cancer Neg Hx    Esophageal cancer Neg Hx    Rectal cancer Neg Hx    Stomach cancer Neg Hx     Social History   Socioeconomic History   Marital status: Widowed    Spouse name: Not on file   Number of children: 2   Years of education: Not on file   Highest education level: Not on file  Occupational History    Employer: [employer]  Tobacco Use   Smoking status: Every Day    Packs/day: 1.00    Years: 28.00    Pack years: 28.00    Types: Cigarettes   Smokeless tobacco: Never  Vaping Use   Vaping Use: Never used  Substance and Sexual Activity   Alcohol use: Yes    Alcohol/week: 14.0 standard drinks    Types: 14 Standard drinks or equivalent per week   Drug use: No   Sexual activity: Not Currently    Birth control/protection: Post-menopausal  Other Topics Concern   Not on file  Social History Narrative   Married. 2 children.     Ages 25, 39 y/o ( entered 09/2012)      Works with [employer]   Social Determinants of Health   Financial Resource Strain: Not on file  Food Insecurity: Not on file  Transportation Needs: Not on file  Physical Activity: Not on file  Stress: Not on file  Social Connections: Not on file  Intimate  Partner Violence: Not on file    ROS Review of Systems  Objective:   Today's Vitals: There were no vitals taken for this visit.  Physical Exam Vas US Renal Artery Bilateral 12/19/20  Right: Normal size right kidney. Normal right Resisitive Index. No         evidence of right renal artery stenosis. RRV flow present.  Left:  Normal size of left kidney. Normal left Resistive Index.         1-59% stenosis of the left renal artery. LRV flow present.  Mesenteric:  Normal Celiac artery findings.      Cor CT 03/29/19   IMPRESSION: LAD is a large vessel with mild (25-49%) calcified plaque in the proximal portion. The mid and distal portion with no plaque. D1 small vessel with no plaque. LCX is a non-dominant artery that gives rise to one large OM1 branch. There is minimal (1-24%) calcified plaque in the proximal portion of the LCX. The mid and distal portion with no plaque. There is no plaque in the OM1 branch. 1. Coronary calcium score of 50.4. This was 27 percentile for age and sex matched control. 2. Normal coronary origin with right dominance. 3. Mild Coronary artery disease. CADRADS 2. Recommend medical therapy. 4. Mild calcification of the aortic root. Assessment & Plan:   Problem List Items Addressed This Visit   None   Outpatient Encounter Medications as of 02/04/2021  Medication Sig   predniSONE (STERAPRED UNI-PAK 21 TAB) 5 MG (21) TBPK tablet Take as directed on package   albuterol (VENTOLIN HFA) 108 (90 Base) MCG/ACT inhaler Inhale 2 puffs into the lungs every 4 (four) hours as needed for wheezing or shortness of breath.   allopurinol (ZYLOPRIM) 300  MG tablet Take 1 tablet (300 mg total) by mouth daily.   cloNIDine (CATAPRES) 0.2 MG tablet Take 1 tablet (0.2 mg total) by mouth 2 (two) times daily.   EPINEPHrine 0.3 mg/0.3 mL IJ SOAJ injection Insert into muscle once with any facial or neck swelling  Instruct in use   glimepiride (AMARYL) 1 MG tablet TAKE 1 TABLET BY MOUTH DAILY BEFORE SUPPER.   glucose blood test strip Use as instructed to monitor FSBS 3x daily. Dx: E11.65   HYDROcodone-acetaminophen (NORCO/VICODIN) 5-325 MG tablet Take 1 tablet by mouth every 6 (six) hours as needed for moderate pain.   HYDROcodone-acetaminophen (NORCO/VICODIN) 5-325 MG tablet Take 1 tablet by mouth every 6 (six) hours as needed for moderate pain.   Lancets MISC Use as instructed to monitor FSBS 3x daily. Dx: E11.65   lidocaine (LIDODERM) 5 % Place 1 patch onto the skin daily. Remove & Discard patch within 12 hours or as directed by MD (Patient not taking: Reported on 01/24/2021)   methocarbamol (ROBAXIN) 500 MG tablet Take 1 tablet (500 mg total) by mouth every 6 (six) hours as needed for muscle spasms. (Patient not taking: Reported on 01/24/2021)   mirtazapine (REMERON) 15 MG tablet TAKE 1/2 TABLET BY MOUTH AT BEDTIME   Multiple Vitamins-Minerals (ONE-A-DAY WOMENS VITACRAVES) CHEW Chew 1 each by mouth daily.   ondansetron (ZOFRAN) 4 MG tablet Take 1 tablet (4 mg total) by mouth every 6 (six) hours as needed for nausea. (Patient not taking: Reported on 01/24/2021)   pioglitazone (ACTOS) 15 MG tablet Take 1 tablet (15 mg total) by mouth daily. Need follow up appointment   spironolactone (ALDACTONE) 25 MG tablet Take 1 tablet (25 mg total) by mouth daily.   SUMAtriptan (IMITREX) 100 MG tablet Take 1  tablet (100 mg total) by mouth every 2 (two) hours as needed for migraine. May repeat in 2 hours if headache persists or recurs.   No facility-administered encounter medications on file as of 02/04/2021.    Follow-up: No follow-ups on file.   Asencion Noble, MD

## 2021-02-04 ENCOUNTER — Encounter: Payer: Self-pay | Admitting: Endocrinology

## 2021-02-04 ENCOUNTER — Ambulatory Visit: Payer: Commercial Managed Care - PPO | Attending: Critical Care Medicine | Admitting: Critical Care Medicine

## 2021-02-04 ENCOUNTER — Other Ambulatory Visit: Payer: Self-pay

## 2021-02-04 ENCOUNTER — Telehealth: Payer: Self-pay | Admitting: Critical Care Medicine

## 2021-02-04 ENCOUNTER — Ambulatory Visit (INDEPENDENT_AMBULATORY_CARE_PROVIDER_SITE_OTHER): Payer: Commercial Managed Care - PPO | Admitting: Endocrinology

## 2021-02-04 ENCOUNTER — Encounter: Payer: Self-pay | Admitting: Critical Care Medicine

## 2021-02-04 VITALS — BP 100/68 | HR 105 | Ht 63.0 in | Wt 138.6 lb

## 2021-02-04 VITALS — BP 122/85 | HR 102 | Resp 16

## 2021-02-04 DIAGNOSIS — F418 Other specified anxiety disorders: Secondary | ICD-10-CM

## 2021-02-04 DIAGNOSIS — E041 Nontoxic single thyroid nodule: Secondary | ICD-10-CM

## 2021-02-04 DIAGNOSIS — G4709 Other insomnia: Secondary | ICD-10-CM

## 2021-02-04 DIAGNOSIS — K760 Fatty (change of) liver, not elsewhere classified: Secondary | ICD-10-CM | POA: Diagnosis not present

## 2021-02-04 DIAGNOSIS — F17210 Nicotine dependence, cigarettes, uncomplicated: Secondary | ICD-10-CM

## 2021-02-04 DIAGNOSIS — E1169 Type 2 diabetes mellitus with other specified complication: Secondary | ICD-10-CM

## 2021-02-04 DIAGNOSIS — I10 Essential (primary) hypertension: Secondary | ICD-10-CM

## 2021-02-04 DIAGNOSIS — Z23 Encounter for immunization: Secondary | ICD-10-CM | POA: Insufficient documentation

## 2021-02-04 DIAGNOSIS — D3501 Benign neoplasm of right adrenal gland: Secondary | ICD-10-CM

## 2021-02-04 DIAGNOSIS — E278 Other specified disorders of adrenal gland: Secondary | ICD-10-CM

## 2021-02-04 DIAGNOSIS — K635 Polyp of colon: Secondary | ICD-10-CM

## 2021-02-04 DIAGNOSIS — E7800 Pure hypercholesterolemia, unspecified: Secondary | ICD-10-CM

## 2021-02-04 DIAGNOSIS — Z1231 Encounter for screening mammogram for malignant neoplasm of breast: Secondary | ICD-10-CM

## 2021-02-04 DIAGNOSIS — I2510 Atherosclerotic heart disease of native coronary artery without angina pectoris: Secondary | ICD-10-CM

## 2021-02-04 DIAGNOSIS — F331 Major depressive disorder, recurrent, moderate: Secondary | ICD-10-CM | POA: Diagnosis not present

## 2021-02-04 DIAGNOSIS — F1029 Alcohol dependence with unspecified alcohol-induced disorder: Secondary | ICD-10-CM | POA: Diagnosis not present

## 2021-02-04 DIAGNOSIS — N179 Acute kidney failure, unspecified: Secondary | ICD-10-CM

## 2021-02-04 DIAGNOSIS — Z720 Tobacco use: Secondary | ICD-10-CM

## 2021-02-04 DIAGNOSIS — M109 Gout, unspecified: Secondary | ICD-10-CM

## 2021-02-04 DIAGNOSIS — I701 Atherosclerosis of renal artery: Secondary | ICD-10-CM

## 2021-02-04 DIAGNOSIS — D649 Anemia, unspecified: Secondary | ICD-10-CM

## 2021-02-04 DIAGNOSIS — G43009 Migraine without aura, not intractable, without status migrainosus: Secondary | ICD-10-CM

## 2021-02-04 LAB — POCT GLYCOSYLATED HEMOGLOBIN (HGB A1C): Hemoglobin A1C: 7.6 % — AB (ref 4.0–5.6)

## 2021-02-04 MED ORDER — NICOTINE 21 MG/24HR TD PT24: 21.0000 mg | MEDICATED_PATCH | Freq: Every day | TRANSDERMAL | 0 refills | Status: DC

## 2021-02-04 MED ORDER — FREESTYLE LANCETS MISC: 12 refills | Status: DC

## 2021-02-04 MED ORDER — PIOGLITAZONE HCL 15 MG PO TABS: 15.0000 mg | ORAL_TABLET | Freq: Every day | ORAL | 3 refills | Status: DC

## 2021-02-04 MED ORDER — NICOTINE POLACRILEX 2 MG MT LOZG: LOZENGE | OROMUCOSAL | 0 refills | Status: DC

## 2021-02-04 MED ORDER — CLONIDINE HCL 0.1 MG PO TABS: 0.1000 mg | ORAL_TABLET | Freq: Two times a day (BID) | ORAL | 2 refills | Status: DC

## 2021-02-04 MED ORDER — METFORMIN HCL ER 500 MG PO TB24: 1000.0000 mg | ORAL_TABLET | Freq: Every day | ORAL | 3 refills | Status: DC

## 2021-02-04 MED ORDER — GLIMEPIRIDE 1 MG PO TABS: ORAL_TABLET | ORAL | 3 refills | Status: DC

## 2021-02-04 MED ORDER — FREESTYLE PRECISION NEO TEST VI STRP: ORAL_STRIP | 12 refills | Status: DC

## 2021-02-04 NOTE — Assessment & Plan Note (Addendum)
Discussed smoking cessation. Rx sent for nicotine patches and lozenges. Referral to [facility], LCSW for behavioral therapy.       Current smoking consumption amount: 1 ppd   Dicsussion on advise to quit smoking and smoking impacts: CV lung impacts   Patient's willingness to quit:  Interested    Methods to quit smoking discussed:  Behavioral mod /meds   Medication management of smoking session drugs discussed:nicotine replacement: combo of nicotine patch and lozenge   Resources provided:  AVS    Setting quit date not established   Follow-up arranged 2 mo and LCSW ref for counseling   Time spent counseling the patient:  5 min

## 2021-02-04 NOTE — Assessment & Plan Note (Addendum)
Blood pressure is normal at today's visit. Continue aldactone and clonidine. Check CMP today for kidney function.

## 2021-02-04 NOTE — Assessment & Plan Note (Signed)
Monitor CBC 

## 2021-02-04 NOTE — Assessment & Plan Note (Signed)
As per depression/anxiety assessment

## 2021-02-04 NOTE — Assessment & Plan Note (Signed)
Pneumonia vaccine given today.  

## 2021-02-04 NOTE — Telephone Encounter (Signed)
Pls see this patient for alcohol and anxiety and smoking dependence /counseling

## 2021-02-04 NOTE — Assessment & Plan Note (Addendum)
Discussed reduction in alcohol consumption. CBC ordered today to assess blood counts and check for anemia.

## 2021-02-04 NOTE — Assessment & Plan Note (Signed)
Hx of AKI with Cr 9.5 in December now resolved

## 2021-02-04 NOTE — Assessment & Plan Note (Signed)
Observation for now

## 2021-02-04 NOTE — Assessment & Plan Note (Signed)
Per lipid clinic has been referred by cardiology to lipid clinic for management

## 2021-02-04 NOTE — Progress Notes (Signed)
NEW Patient Office Visit  Subjective:  Patient ID: Stacy [name], female    DOB: 1965/10/18  Age: 56 y.o. MRN: [id number]  CC:  Chief Complaint  Patient presents with   Hospitalization Follow-up   New Patient (Initial Visit)    HPI Stacy [name] presents for follow up after hospitalization for an AKI/acute renal failure and electrolyte imbalance that resulted from severe vomiting. She was concurrently treated for an acute gout flare at that time. While in the hospital, she received nephrology and orthopedics consult. Her kidney function improved during her hospital stay, and she was cleared by nephrology for discharge. She received bilateral knee arthrocentesis and steroid injection of her knees to treat her gout. She is in need of an MRI for her knees, and has just gotten insurance approval for this procedure. She is attempting to schedule the MRI. The following includes the summary from discharge:   "56 y.o. female with medical history significant of HTN, HLD, GERD, T2DM, depression with anxiety, gout, nonobstructive CAD, left renal artery stenosis, tobacco abuse treated with abdominal pain, nausea and vomiting and not feeling well along with bilateral knee pain.  On presentation, she was slightly hypotensive, tachycardic.  WBC of 12.4; BUN of 77, creatinine of 9.5, lipase of 62.  Chest x-ray showed no acute findings.  Ultrasound of abdomen showed no acute findings to explain her abdominal pain.  She was started on IV fluids.  Nephrology was consulted.  During the hospitalization, her condition has gradually improved; creatinine is improving.  Orthopedics was consulted and patient underwent bilateral knee arthrocentesis and steroid injection; synovial fluid was consistent with gout flare.  She was also treated with oral prednisone.  Currently her renal function has much improved, nephrology has cleared the patient for discharge.  Currently she is hemodynamically stable, tolerating diet.  She  will be discharged home today on oral prednisone and allopurinol will be started.  Follow-up with PCP and orthopedics.  Follow-up with nephrology if needed."  Today, the patient states that she has improved since her hospital stay. She has had follow ups with multiple providers for her other medical issues. She followed up with cardiology on 01/24/21 and was started on spironolactone. She has a diagnosis of mild nonobstructive CAD that is being medically managed at this point in time. She previously took a statin for this issue, but it was stopped during hospitalization due to elevated liver transaminase. She has been referred to the lipid clinic for further management of this issue. She followed up with endocrinology for her diabetes earlier today. She is currently on Amaryl and Actos, and metformin was added back to her regimen today. She checks her blood sugars in the morning, and reports they have typically been between 105-110 mg/dL.   Stacy [name] gout symptoms remain troublesome. She is having significant pain in her right knee from the gout flare. She is taking Vicodin for this. She is unable to do any weight-bearing on this leg. She also has pain from the gout flare in her right foot. Her other concern today is that she is having significant trouble with sleeping. She has difficulty falling asleep and staying asleep, and this has been significantly impacting her mood. She has experienced racing thoughts when trying to fall asleep, and she has felt down and depressed.  The patient states that she is drinking less alcohol than previously. She now drinks two whiskeys 3-4 times per week, rather than every day. She still smokes, less than 1 PPD.  She states she has been smoking a little more previously due to feelings of stress.  Notes reviewed below :  Cardiology OV 01/24/21.  Hosp DC from early 12/2020  Admit date: 12/19/2020 Discharge date: 12/22/2020   Admitted From: Home Disposition: Home    Recommendations for Outpatient Follow-up:  Follow up with PCP in 1 week with repeat CBC/BMP Outpatient follow-up with nephrology if needed Outpatient follow-up with orthopedics/Dr. Lorin Mercy Follow up in ED if symptoms worsen or new appear     Home Health: No Equipment/Devices: None   Discharge Condition: Stable CODE STATUS: Full Diet recommendation: Heart healthy/carb modified   Brief/Interim Summary: 56 y.o. female with medical history significant of HTN, HLD, GERD, T2DM, depression with anxiety, gout, nonobstructive CAD, left renal artery stenosis, tobacco abuse treated with abdominal pain, nausea and vomiting and not feeling well along with bilateral knee pain.  On presentation, she was slightly hypotensive, tachycardic.  WBC of 12.4; BUN of 77, creatinine of 9.5, lipase of 62.  Chest x-ray showed no acute findings.  Ultrasound of abdomen showed no acute findings to explain her abdominal pain.  She was started on IV fluids.  Nephrology was consulted.  During the hospitalization, her condition has gradually improved; creatinine is improving.  Orthopedics was consulted and patient underwent bilateral knee arthrocentesis and steroid injection; synovial fluid was consistent with gout flare.  She was also treated with oral prednisone.  Currently her renal function has much improved, nephrology has cleared the patient for discharge.  Currently she is hemodynamically stable, tolerating diet.  She will be discharged home today on oral prednisone and allopurinol will be started.  Follow-up with PCP and orthopedics.  Follow-up with nephrology if needed.   Discharge Diagnoses:    Acute kidney injury/acute renal failure Anion gap metabolic acidosis -Possibly from ATN from dehydration and hypotension -Presented with creatinine of 9.5; treated with IV fluids.  Creatinine much improved to 1.57 today.  - Renal ultrasound negative for hydronephrosis  -nephrology following and has cleared the patient for  discharge.  Outpatient follow-up with nephrology if needed. -Outpatient follow-up of BMP by PCP   Possible UTI -Present on admission.  Treated with 3 days of Rocephin.  No need for any further antibiotics on discharge.  Currently afebrile.   Bilateral knee pain and swelling/?  Acute gout flare -Patient states that she has had similar symptoms in the past requiring arthrocentesis which was consistent with acute gout.   - Avoid colchicine because of recent nausea and vomiting -Status post bilateral arthrocentesis by orthopedics on 12/20/2020: Synovial fluid analysis consistent with acute gout.  Bilateral knees were injected with steroids per orthopedics on 12/20/2020. -Continue oral prednisone on discharge 40 mg daily for 7 days.  Outpatient follow-up with orthopedics.     Nausea/vomiting -Questionable cause.  Ultrasound abdomen was negative for acute cause/gallbladder disease. -Much improved.  Currently tolerating diet.   Right upper quadrant pain/shoulder pain -Might be musculoskeletal as well.  Lipase only mildly elevated.  Troponin negative.  Continue current pain management.   Hypomagnesemia -Resolved   Mild hyperkalemia -Resolved   Leukocytosis -Possibly reactive.  Resolved   Renal artery stenosis -Renal duplex showed 1 to 59% stenosis of left renal 3.;  No evidence of right renal artery stenosis.   Normocytic anemia -Questionable cause.  Hemoglobin stable.  Outpatient follow-up   Fatty liver  Mildly elevated AST -Possibly from alcohol use.  Outpatient follow-up with GI.  Abstain from alcohol   Hypertension -Blood pressure on the lower side.  Antihypertensives on hold   Hyperlipidemia -Statin on hold because of slightly elevated LFTs.  Outpatient follow-up with PCP.   GERD -Has completed Protonix treatment as an outpatient.  Follow-up with PCP.   Depression with anxiety -Stable.  Continue Remeron   Adrenal mass, right -Has outpatient follow-up   Tobacco  use -Continue nicotine patch.  Patient was counseled regarding cessation by admitting hospitalist  Cards 1/6    Essential hypertension: Medications were held during hospitalization 12/22 due to acute renal failure and hypotension. She was restarted on clonidine 0.2 mg 3 times a day.  Initial clinic blood pressure is elevated, however on my recheck it has actually decreased to 100/64.  She reports high blood pressure readings at home.  She would like to take as few pills as possible and would like something that may help with lower extremity edema.  We will restart spironolactone 25 mg once daily and reduce clonidine to 0.2 mg twice daily.  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  We will plan to follow-up on her blood pressure in 3 to 6 months depending on her availability.  She reports she currently is missing a lot of work due to multiple appointments and recent hospitalization.   Mild nonobstructive CAD: Coronary CT March 2021 revealed mild disease in the LAD and LCx.  Her coronary calcium score is 50.4, 94th percentile for age/sex, mild calcification of the aortic root.  Medical therapy recommended.  She denies chest pain, dyspnea or other symptoms concerning for angina.  No further ischemic evaluation warranted at this time.  Her statin was stopped during hospitalization 12/22 due to elevated transaminase.  This was also a problem in the past for her.  We will refer her to lipid clinic for management of lipids.  Aspirin was stopped during hospitalization due to anemia.  Would favor restarting aspirin 81 mg at next visit if blood counts have stabilized.   Hyperlipidemia LDL goal < 70: LDL 75 6/22.  She was previously on atorvastatin 40 mg.  As noted above statin was stopped during recent hospitalization due to elevated transaminase.  We will refer her to lipid clinic for management.   Bilateral leg edema: She has 1+ pitting  edema in both lower extremities.  She is having a significant amount of right knee swelling and pain due to gout.  She requests a blood pressure medicine that may help reduce some of the swelling.  We will restart spironolactone 25 mg as noted above she wants to take as few pills as possible and this is a potassium sparing diuretic.  Advised that she may not notice significant improvement in joint swelling with spironolactone.  Encouraged leg elevation (toes to nose) to help decrease leg swelling.   Difficulty sleeping: She states she is having difficulty sleeping because her mind is racing and she is having knee pain and requests a prescription for Ambien. States melatonin does not work for her. I encouraged her to use nonpharmacologic methods to help her sleep and suggested the Calm app or other methods that may help her calm her mind before going to sleep.  Encouraged leg elevation to help with pain and swelling.   Tobacco abuse: She continues to smoke less than 1/2 packs/day. Has reduced daytime smoking. Feels that her son who has recently moved back to [place] may help her quit because he is a big encouragement. Complete cessation advised.  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 Posey Pronto 02/27/2014   Tobacco abuse    Tubular adenoma of colon     Past Surgical History:  Procedure Laterality Date   ANAL FISTULECTOMY  01/19/1985   BREAST EXCISIONAL BIOPSY Left    BREAST LUMPECTOMY Left 1988   PAROTIDECTOMY  03/30/2011   Procedure: PAROTIDECTOMY;  Surgeon: Izora Gala, MD;  Location: [facility];  Service: ENT;  Laterality: Right;   WRIST GANGLION EXCISION Left 1988    Family History  Problem Relation Age of Onset   Coronary artery disease Father 20       CAD,CABG   Hypertension Mother    Stroke Mother    Diabetes Sister        uncontrolled   Cancer Sister 83       uterine cancer   Aneurysm Sister        brain   Heart attack Maternal Grandmother    Blindness Paternal Grandmother    Breast cancer Neg Hx    Colon cancer Neg Hx    Esophageal cancer Neg Hx    Rectal cancer Neg Hx    Stomach cancer Neg Hx     Social History   Socioeconomic History   Marital status: Widowed    Spouse name: Not on file   Number of children: 2   Years of education: Not on file   Highest education level: Not on file  Occupational History    Employer: [employer]  Tobacco Use   Smoking status: Every Day    Packs/day: 1.00    Years: 28.00    Pack years: 28.00    Types: Cigarettes   Smokeless tobacco: Never  Vaping Use   Vaping Use: Never used  Substance and Sexual Activity   Alcohol use: Yes    Alcohol/week: 14.0 standard drinks    Types: 14 Standard drinks or equivalent per week   Drug use: No   Sexual activity: Not Currently    Birth control/protection: Post-menopausal  Other Topics Concern   Not on file  Social History Narrative   Married. 2 children.    Ages 48, 62 y/o ( entered 09/2012)      Works with [employer]   Social Determinants of Health   Financial Resource Strain: Not on file  Food Insecurity: Not on file  Transportation Needs: Not on file  Physical Activity: Not on file  Stress: Not on  file  Social Connections: Not on file  Intimate Partner Violence: Not on file    Outpatient  Medications Prior to Visit  Medication Sig Dispense Refill   albuterol (VENTOLIN HFA) 108 (90 Base) MCG/ACT inhaler Inhale 2 puffs into the lungs every 4 (four) hours as needed for wheezing or shortness of breath. 6.7 g 1   allopurinol (ZYLOPRIM) 300 MG tablet Take 1 tablet (300 mg total) by mouth daily. 30 tablet 4   cloNIDine (CATAPRES) 0.1 MG tablet Take 1 tablet (0.1 mg total) by mouth 2 (two) times daily. 60 tablet 2   EPINEPHrine 0.3 mg/0.3 mL IJ SOAJ injection Insert into muscle once with any facial or neck swelling  Instruct in use 1 Device 1   metFORMIN (GLUCOPHAGE-XR) 500 MG 24 hr tablet Take 2 tablets (1,000 mg total) by mouth daily with supper. 120 tablet 3   Multiple Vitamins-Minerals (ONE-A-DAY WOMENS VITACRAVES) CHEW Chew 1 each by mouth daily.     predniSONE (STERAPRED UNI-PAK 21 TAB) 5 MG (21) TBPK tablet Take as directed on package (Patient not taking: Reported on 02/04/2021) 21 tablet 0   spironolactone (ALDACTONE) 25 MG tablet Take 1 tablet (25 mg total) by mouth daily. 90 tablet 3   glimepiride (AMARYL) 1 MG tablet TAKE 1 TABLET BY MOUTH DAILY BEFORE SUPPER. 30 tablet 0   Lancets MISC Use as instructed to monitor FSBS 3x daily. Dx: E11.65 100 each 11   methocarbamol (ROBAXIN) 500 MG tablet Take 1 tablet (500 mg total) by mouth every 6 (six) hours as needed for muscle spasms. 30 tablet 0   mirtazapine (REMERON) 15 MG tablet TAKE 1/2 TABLET BY MOUTH AT BEDTIME 45 tablet 2   pioglitazone (ACTOS) 15 MG tablet Take 1 tablet (15 mg total) by mouth daily. Need follow up appointment 30 tablet 0   SUMAtriptan (IMITREX) 100 MG tablet Take 1 tablet (100 mg total) by mouth every 2 (two) hours as needed for migraine. May repeat in 2 hours if headache persists or recurs. 9 tablet 0   HYDROcodone-acetaminophen (NORCO/VICODIN) 5-325 MG tablet Take 1 tablet by mouth every 6 (six) hours as needed for  moderate pain. 10 tablet 0   glucose blood test strip Use as instructed to monitor FSBS 3x daily. Dx: E11.65 200 each 12   HYDROcodone-acetaminophen (NORCO/VICODIN) 5-325 MG tablet Take 1 tablet by mouth every 6 (six) hours as needed for moderate pain. 14 tablet 0   lidocaine (LIDODERM) 5 % Place 1 patch onto the skin daily. Remove & Discard patch within 12 hours or as directed by MD (Patient not taking: Reported on 01/24/2021) 30 patch 0   ondansetron (ZOFRAN) 4 MG tablet Take 1 tablet (4 mg total) by mouth every 6 (six) hours as needed for nausea. (Patient not taking: Reported on 01/24/2021) 20 tablet 0   No facility-administered medications prior to visit.    Allergies  Allergen Reactions   Almond Meal Anaphylaxis and Swelling   Other Anaphylaxis    Almond Butter   Aspirin Nausea Only    ROS Review of Systems  Constitutional:  Positive for appetite change and fatigue.  HENT: Negative.    Eyes: Negative.   Respiratory: Negative.    Cardiovascular:  Negative for chest pain and palpitations.  Gastrointestinal:  Negative for abdominal pain, nausea and vomiting.  Endocrine: Negative.   Genitourinary:  Negative for dysuria and hematuria.  Musculoskeletal:  Positive for arthralgias and joint swelling.  Skin: Negative.   Neurological:  Positive for weakness.  Psychiatric/Behavioral:  Positive for dysphoric mood and sleep disturbance.      Objective:    Physical Exam  Constitutional:      Appearance: Normal appearance. She is normal weight.  HENT:     Head: Normocephalic and atraumatic.     Mouth/Throat:     Mouth: Mucous membranes are moist.     Pharynx: Oropharynx is clear.  Eyes:     General: No scleral icterus. Cardiovascular:     Rate and Rhythm: Normal rate and regular rhythm.     Heart sounds: Normal heart sounds.  Pulmonary:     Effort: Pulmonary effort is normal.  Musculoskeletal:     Right foot: Tenderness (tophi present on metatarsal of first toe) present.     Left  foot: Normal.  Skin:    General: Skin is warm and dry.  Neurological:     Mental Status: She is alert.  Psychiatric:        Mood and Affect: Mood normal.        Behavior: Behavior normal.        Thought Content: Thought content normal.        Judgment: Judgment normal.    BP 122/85    Pulse (!) 102    Resp 16    SpO2 99%  Wt Readings from Last 3 Encounters:  02/04/21 138 lb 9.6 oz (62.9 kg)  01/24/21 149 lb 6.4 oz (67.8 kg)  12/27/20 145 lb 12.8 oz (66.1 kg)     Health Maintenance Due  Topic Date Due   Zoster Vaccines- Shingrix (1 of 2) Never done   OPHTHALMOLOGY EXAM  06/10/2017   COVID-19 Vaccine (3 - Booster for Moderna series) 04/21/2019   MAMMOGRAM  03/09/2020   PAP SMEAR-Modifier  09/10/2020    There are no preventive care reminders to display for this patient. Vas US Renal Artery Bilateral 12/19/20  Right: Normal size right kidney. Normal right Resisitive Index. No         evidence of right renal artery stenosis. RRV flow present.  Left:  Normal size of left kidney. Normal left Resistive Index.         1-59% stenosis of the left renal artery. LRV flow present.  Mesenteric:  Normal Celiac artery findings.      Cor CT 03/29/19   IMPRESSION: LAD is a large vessel with mild (25-49%) calcified plaque in the proximal portion. The mid and distal portion with no plaque. D1 small vessel with no plaque. LCX is a non-dominant artery that gives rise to one large OM1 branch. There is minimal (1-24%) calcified plaque in the proximal portion of the LCX. The mid and distal portion with no plaque. There is no plaque in the OM1 branch. 1. Coronary calcium score of 50.4. This was 65 percentile for age and sex matched control. 2. Normal coronary origin with right dominance. 3. Mild Coronary artery disease. CADRADS 2. Recommend medical therapy. 4. Mild calcification of the aortic root. Lab Results  Component Value Date   TSH 0.61 06/06/2019   Lab Results  Component Value  Date   WBC 11.6 (H) 01/23/2021   HGB 9.8 (L) 01/23/2021   HCT 32.2 (L) 01/23/2021   MCV 73.2 (L) 01/23/2021   PLT 699 (H) 01/23/2021   Lab Results  Component Value Date   NA 142 12/27/2020   K 4.2 12/27/2020   CO2 28 12/27/2020   GLUCOSE 182 (H) 12/27/2020   BUN 22 12/27/2020   CREATININE 0.93 12/27/2020   BILITOT 0.2 12/27/2020   ALKPHOS 78 12/20/2020   AST 42 (H) 12/27/2020   ALT 87 (H) 12/27/2020  PROT 7.3 12/27/2020   ALBUMIN 2.8 (L) 12/22/2020   CALCIUM 10.8 (H) 12/27/2020   ANIONGAP 6 12/22/2020   GFR 95.71 06/06/2019   Lab Results  Component Value Date   CHOL 208 (H) 08/02/2019   Lab Results  Component Value Date   HDL 42 06/19/2020   Lab Results  Component Value Date   LDLCALC 75 06/19/2020   Lab Results  Component Value Date   TRIG 230 (A) 06/19/2020   Lab Results  Component Value Date   CHOLHDL 3.3 08/02/2019   Lab Results  Component Value Date   HGBA1C 7.6 (A) 02/04/2021      Assessment & Plan:   Problem List Items Addressed This Visit       Cardiovascular and Mediastinum   Hypertension    Blood pressure is normal at today's visit. Continue aldactone and clonidine. Check CMP today for kidney function.       Relevant Orders   CBC with Differential/Platelet   Migraines    Not active ,monitor      Coronary artery calcification seen on CAT scan    Per cardiology, medical management warranted at this time pending normal liver function. Referral to lipid clinic is in place for management of lipids, who will decide whether to restart statin and/or aspirin.       Renal artery stenosis (HCC)    Observation for now        Digestive   Colon polyp    Has call back for colonoscopy      Fatty liver    Discussed continued decrease in alcohol consumption. Discussed impacts of alcohol on her overall health.  Discussed healthy diet options aka food as medicine      Relevant Orders   Comprehensive metabolic panel     Endocrine   Type  2 diabetes mellitus with other specified complication [facility]) - Primary    Patient saw endocrinology today for diabetes management. Metformin restarted today. Endocrine amaryl and Actos   Patient will continue seeing endocrinology as planned.      Relevant Medications   pioglitazone (ACTOS) 15 MG tablet   glimepiride (AMARYL) 1 MG tablet   Other Relevant Orders   Comprehensive metabolic panel   Thyroid nodule  managed Dr. Posey Pronto    Per endocrine      Adenoma of right adrenal gland    This is being monitored        Genitourinary   RESOLVED: Acute renal failure ([place])    As per AKI assessment       RESOLVED: AKI (acute kidney injury) ([place])    Hx of AKI with Cr 9.5 in December now resolved         Other   Hyperlipemia    Per lipid clinic has been referred by cardiology to lipid clinic for management       Depression with anxiety    Off medication ,  Still drinking some ETOH and smoking  PHQ9 low  Plan referral to LCSW      Gout    Continue allopurinol. Follow up with orthopedics as planned. Schedule MRI as planned. Discontinue lidocaine patches for gout pain as they are not providing benefit. Continue with other pain medications as prescribed.       Tobacco use    Discussed smoking cessation. Rx sent for nicotine patches and lozenges. Referral to Asante, LCSW for behavioral therapy.      Current smoking consumption amount: 1 ppd  Dicsussion on advise to quit  smoking and smoking impacts: CV lung impacts  Patient's willingness to quit:  Interested   Methods to quit smoking discussed:  Behavioral mod /meds  Medication management of smoking session drugs discussed:nicotine replacement: combo of nicotine patch and lozenge  Resources provided:  AVS   Setting quit date not established  Follow-up arranged 2 mo and LCSW ref for counseling   Time spent counseling the patient:  5 min       EtOH dependence ([place])    Discussed reduction in alcohol consumption. CBC  ordered today to assess blood counts and check for anemia.       Relevant Orders   CBC with Differential/Platelet   Insomnia    Discontinue Remeron as it is not producing desired therapeutic effect, and risks of medication outweigh benefits. Trial Melatonin, 10 mg to help with sleep.       Anemia    Monitor CBC      Need for Streptococcus pneumoniae vaccination    Pneumonia vaccine given today.       Relevant Orders   Pneumococcal conjugate vaccine 20-valent (Completed)   Encounter for screening mammogram for malignant neoplasm of breast    Order for bilateral mammogram placed.       Relevant Orders   MM DIGITAL SCREENING BILATERAL   RESOLVED: Adrenal mass, right Dr Erskine Squibb   RESOLVED: Moderate episode of recurrent major depressive disorder (HCC)    PHQ-9 indicated moderate depressive symptoms. Referral made to Asante, LCSW for appropriate therapy.        Meds ordered this encounter  Medications   glucose blood (FREESTYLE PRECISION NEO TEST) test strip    Sig: Use as instructed    Dispense:  100 each    Refill:  12   Lancets (FREESTYLE) lancets    Sig: Use as instructed    Dispense:  100 each    Refill:  12   pioglitazone (ACTOS) 15 MG tablet    Sig: Take 1 tablet (15 mg total) by mouth daily.    Dispense:  60 tablet    Refill:  3   glimepiride (AMARYL) 1 MG tablet    Sig: TAKE 1 TABLET BY MOUTH DAILY BEFORE SUPPER.    Dispense:  60 tablet    Refill:  3   nicotine (NICOTINE STEP 1) 21 mg/24hr patch    Sig: Place 1 patch (21 mg total) onto the skin daily.    Dispense:  28 patch    Refill:  0   nicotine polacrilex (NICOTINE MINI) 2 MG lozenge    Sig: Use three times a day as needed for smoking cessation    Dispense:  100 tablet    Refill:  0   65 minutes spent reviewing all old records performing Hx and physical, pt education , complex disease management, multiple consulting providers   Follow-up: Return in about 2 months (around 04/04/2021).     Asencion Noble, MD

## 2021-02-04 NOTE — Assessment & Plan Note (Signed)
Has call back for colonoscopy

## 2021-02-04 NOTE — Assessment & Plan Note (Signed)
This is being monitored

## 2021-02-04 NOTE — Assessment & Plan Note (Signed)
Per endocrine  

## 2021-02-04 NOTE — Assessment & Plan Note (Signed)
PHQ-9 indicated moderate depressive symptoms. Referral made to Asante, LCSW for appropriate therapy.

## 2021-02-04 NOTE — Patient Instructions (Addendum)
Labs today screening labs metabolic panel, blood counts  A mammogram will be ordered  Stop smoking use nicotine patch 21mg  daily and nicotine lozenge , a prescription sent [facility] your [facility] or you can get from health dept  Decrease alcohol use.  We will arrange a meeting with our counselor Asante for a visit   Keep your lipid clinic appt.  Keep your MRI of the knee appt  We stopped the remeron  See attachement for insomnia recommendations and try 10mg  melatonin one hour before sleep  Return Dr Joya Gaskins 2 months  A prevnar 20 pneumonia vaccine was given

## 2021-02-04 NOTE — Assessment & Plan Note (Signed)
Off medication ,  Still drinking some ETOH and smoking  PHQ9 low  Plan referral to LCSW

## 2021-02-04 NOTE — Assessment & Plan Note (Signed)
Continue allopurinol. Follow up with orthopedics as planned. Schedule MRI as planned. Discontinue lidocaine patches for gout pain as they are not providing benefit. Continue with other pain medications as prescribed.

## 2021-02-04 NOTE — Patient Instructions (Addendum)
Stay on Glimeperide 1 mg daily  Check blood sugars on waking up 3 days a week  Also check blood sugars about 2 hours after meals and do this after different meals by rotation  Recommended blood sugar levels on waking up are 90-130 and about 2 hours after meal is 130-160  Please bring your blood sugar monitor to each visit, thank you

## 2021-02-04 NOTE — Assessment & Plan Note (Addendum)
Patient saw endocrinology today for diabetes management. Metformin restarted today. Endocrine amaryl and Actos   Patient will continue seeing endocrinology as planned.

## 2021-02-04 NOTE — Assessment & Plan Note (Signed)
Order for bilateral mammogram placed.

## 2021-02-04 NOTE — Assessment & Plan Note (Signed)
Not active ,monitor

## 2021-02-04 NOTE — Assessment & Plan Note (Signed)
Discontinue Remeron as it is not producing desired therapeutic effect, and risks of medication outweigh benefits. Trial Melatonin, 10 mg to help with sleep.

## 2021-02-04 NOTE — Progress Notes (Signed)
Patient ID: Stacy [name], female   DOB: Sep 13, 1965, 56 y.o.   MRN: [id number]             Reason for Appointment: Endocrinology follow-up    History of Present Illness:          Date of diagnosis of type 2 diabetes mellitus: 2014        Background history:    She apparently was asymptomatic at the time of diagnosis She was started on metformin but no details of initial management or A1c are available She thinks about 3 years ago she was started on Farxiga also with improved control and no side effects with this She has mostly been followed by a PCP but may have seen an endocrinologist also About 1-1/2-2 years ago she was also given basal insulin with Tyler Aas presumably for higher sugars but not clear what her A1c was at that time  Recent history:    Her A1c is 7.6   Non-insulin hypoglycemic drugs the patient is taking TGY:BWLSL 15 mg, Amaryl 1mg  at dinnertime  Current management, blood sugar patterns and problems identified: She has not been seen since 7/21  At that time she was recommended Jardiance in addition to Amaryl, Actos and metformin  In December because of renal dysfunction her metformin was stopped and still not resumed  Not clear why she did not continue Synjardy that was prescribed previously  She has been hospitalized and also has had some issues with gout requiring steroids  Blood sugar monitoring has been sporadic and mostly in the mornings        Side effects from medications have been: Diarrhea from metformin  Compliance with the medical regimen:  Fair Hypoglycemia: none   Glucose monitoring:  done  <1 times a day         Glucometer: Freestyle      Blood Glucose readings by download:   PRE-MEAL Fasting Lunch Dinner Bedtime Overall  Glucose range: 107-207 106-148 169    Mean/median: 140    141   Previous readings: Recently 130-150 fasting   Self-care: The diet that the patient has been following is: tries to limit fried food.             Dietician visit, most recent: None  Weight history:  Wt Readings from Last 3 Encounters:  02/04/21 138 lb 9.6 oz (62.9 kg)  01/24/21 149 lb 6.4 oz (67.8 kg)  12/27/20 145 lb 12.8 oz (66.1 kg)    Glycemic control:   Lab Results  Component Value Date   HGBA1C 7.6 (A) 02/04/2021   HGBA1C 8.1 06/19/2020   HGBA1C 7.3 (H) 12/04/2019   Lab Results  Component Value Date   MICROALBUR 24.8 (H) 12/10/2017   LDLCALC 75 06/19/2020   CREATININE 0.93 12/27/2020   Lab Results  Component Value Date   MICRALBCREAT 6.5 12/10/2017    Lab Results  Component Value Date   FRUCTOSAMINE 233 08/02/2019   FRUCTOSAMINE 244 08/02/2018   FRUCTOSAMINE 248 03/11/2018   OTHER active issues including hypertension are discussed in review of systems   Allergies as of 02/04/2021       Reactions   Almond Meal Anaphylaxis, Swelling   Other Anaphylaxis   Almond Butter   Aspirin Nausea Only        Medication List        Accurate as of February 04, 2021 11:37 AM. If you have any questions, ask your nurse or doctor.  STOP taking these medications    glucose blood test strip Stopped by: Asencion Noble, MD   Lancets Misc Stopped by: Asencion Noble, MD       TAKE these medications    albuterol 108 (90 Base) MCG/ACT inhaler Commonly known as: VENTOLIN HFA Inhale 2 puffs into the lungs every 4 (four) hours as needed for wheezing or shortness of breath.   allopurinol 300 MG tablet Commonly known as: ZYLOPRIM Take 1 tablet (300 mg total) by mouth daily.   cloNIDine 0.1 MG tablet Commonly known as: CATAPRES Take 1 tablet (0.1 mg total) by mouth 2 (two) times daily. What changed:  medication strength how much to take Changed by: Elayne Snare, MD   EPINEPHrine 0.3 mg/0.3 mL Soaj injection Commonly known as: EPI-PEN Insert into muscle once with any facial or neck swelling  Instruct in use   glimepiride 1 MG tablet Commonly known as: AMARYL TAKE 1 TABLET BY MOUTH DAILY  BEFORE SUPPER.   HYDROcodone-acetaminophen 5-325 MG tablet Commonly known as: NORCO/VICODIN Take 1 tablet by mouth every 6 (six) hours as needed for moderate pain.   HYDROcodone-acetaminophen 5-325 MG tablet Commonly known as: NORCO/VICODIN Take 1 tablet by mouth every 6 (six) hours as needed for moderate pain.   lidocaine 5 % Commonly known as: LIDODERM Place 1 patch onto the skin daily. Remove & Discard patch within 12 hours or as directed by MD   metFORMIN 500 MG 24 hr tablet Commonly known as: GLUCOPHAGE-XR Take 2 tablets (1,000 mg total) by mouth daily with supper. Started by: Elayne Snare, MD   methocarbamol 500 MG tablet Commonly known as: Robaxin Take 1 tablet (500 mg total) by mouth every 6 (six) hours as needed for muscle spasms.   mirtazapine 15 MG tablet Commonly known as: REMERON TAKE 1/2 TABLET BY MOUTH AT BEDTIME   ondansetron 4 MG tablet Commonly known as: ZOFRAN Take 1 tablet (4 mg total) by mouth every 6 (six) hours as needed for nausea.   One-A-Day Womens [employer] 1 each by mouth daily.   pioglitazone 15 MG tablet Commonly known as: ACTOS Take 1 tablet (15 mg total) by mouth daily. Need follow up appointment   predniSONE 5 MG (21) Tbpk tablet Commonly known as: STERAPRED UNI-PAK 21 TAB Take as directed on package   spironolactone 25 MG tablet Commonly known as: ALDACTONE Take 1 tablet (25 mg total) by mouth daily.   SUMAtriptan 100 MG tablet Commonly known as: Imitrex Take 1 tablet (100 mg total) by mouth every 2 (two) hours as needed for migraine. May repeat in 2 hours if headache persists or recurs.        Allergies:  Allergies  Allergen Reactions   Almond Meal Anaphylaxis and Swelling   Other Anaphylaxis    Almond Butter   Aspirin Nausea Only    Past Medical History:  Diagnosis Date   Acute bronchitis 08/02/2019   Adrenal adenoma    as per MRI 03/09/14   Allergy    Anxiety    Coronary artery calcification seen on CAT  scan 04/12/2018   Depression    Diabetes mellitus    Diverticulosis    Elevated LFTs    elevated since 09/2013   Fatty liver    moderate- as per 03/06/17 MRI liver   GERD (gastroesophageal reflux disease)    Gout    H/O hiatal hernia    Headache(784.0)    History of depression    Hyperlipemia    Hyperplastic colon polyp  Hypertension    Positive hepatitis C antibody test    RNA NEGATIVE   Renal artery stenosis ([place]) 08/02/2019   06/2019: 1-59% stenosis on L    Thyroid nodule  managed Dr. Posey Pronto 02/27/2014   Tobacco abuse    Tubular adenoma of colon     Past Surgical History:  Procedure Laterality Date   ANAL FISTULECTOMY  01/19/1985   BREAST EXCISIONAL BIOPSY Left    BREAST LUMPECTOMY Left 1988   PAROTIDECTOMY  03/30/2011   Procedure: PAROTIDECTOMY;  Surgeon: Izora Gala, MD;  Location: [facility];  Service: ENT;  Laterality: Right;   WRIST GANGLION EXCISION Left 1988    Family History  Problem Relation Age of Onset   Coronary artery disease Father 29       CAD,CABG   Hypertension Mother    Stroke Mother    Diabetes Sister        uncontrolled   Cancer Sister 74       uterine cancer   Aneurysm Sister        brain   Heart attack Maternal Grandmother    Blindness Paternal Grandmother    Breast cancer Neg Hx    Colon cancer Neg Hx    Esophageal cancer Neg Hx    Rectal cancer Neg Hx    Stomach cancer Neg Hx     Social History:  reports that she has been smoking cigarettes. She has a 28.00 pack-year smoking history. She has never used smokeless tobacco. She reports current alcohol use of about 14.0 standard drinks per week. She reports that she does not use drugs.   Review of Systems    Lipid history: Managed by PCP with atorvastatin 20 mg     Lab Results  Component Value Date   CHOL 208 (H) 08/02/2019   HDL 42 06/19/2020   LDLCALC 75 06/19/2020   TRIG 230 (A) 06/19/2020   CHOLHDL 3.3 08/02/2019           Hypertension: This has been present  for several years Previously on valsartan HCTZ, also on Norvasc and 50 mg Aldactone Blood pressure appears much lower and she is now only on clonidine given by her previous PCP along with spironolactone 25 mg  Also followed by cardiologist and clonidine was reduced to twice a day instead of 3 times a day recently  She was started on Aldactone on her visit in June 21 Previously potassium was low and evaluation with aldosterone level was normal, she does tend to have a high renin level She did not collect her 24-hour urine for potassium    Has had a right adrenal adenoma diagnosed initially in 2009 and stable on further imaging subsequently including 01/2018  BP Readings from Last 3 Encounters:  02/04/21 122/85  02/04/21 100/68  01/24/21 (!) 150/80   Lab Results  Component Value Date   K 4.2 12/27/2020   Lab Results  Component Value Date   CREATININE 0.93 12/27/2020   CREATININE 1.57 (H) 12/22/2020   CREATININE 2.91 (H) 12/21/2020    Reportedly has fatty liver, last ALT:  Lab Results  Component Value Date   ALT 87 (H) 12/27/2020    GOITER: She has had a goiter Thyroid levels were normal. Ultrasound previously showed a stable 7 mm nodule only in the left side, this was done in 12/2014  Lab Results  Component Value Date   TSH 0.61 06/06/2019   TSH 0.70 01/24/2018   TSH 0.57 11/06/2015   FREET4 1.06  06/06/2019   FREET4 0.9 11/06/2015   FREET4 1.24 01/01/2015    LABS:  Office Visit on 02/04/2021  Component Date Value Ref Range Status   Hemoglobin A1C 02/04/2021 7.6 (A)  4.0 - 5.6 % Final    Physical Examination:  BP 100/68    Pulse (!) 105    Ht 5\' 3"  (1.6 m)    Wt 138 lb 9.6 oz (62.9 kg)    SpO2 99%    BMI 24.55 kg/m       ASSESSMENT:  Diabetes type 2, on oral agents  See history of present illness for detailed discussion of current diabetes management, blood sugar patterns and problems identified  Current regimen is only Amaryl and Actos  A1c is still  high at 7.6 She is finally coming back for follow-up after 1-1/2 years Not taking metformin which was started in the hospital and also not taking Jardiance that was recommended Surprisingly her blood sugars are not much higher at home except for a reading of 207 today, checking mostly fasting readings  HYPERTENSION:  Blood pressure is now being treated with clonidine but she has relatively low blood pressure reading, tends to have initial high of whitecoat syndrome Blood pressure has not come up despite taking the clonidine twice a day instead of 3 times a day and is only 100/68 on the second measurement  History of hypokalemia: Controlled with spironolactone and she will continue the same dose  PLAN:     Continue 15 mg Actos and 1 mg Amaryl in the evening Restart metformin, may take 500 mg first for the first couple of days and then go up to at least 1000 mg at dinnertime Consider adding Jardiance once blood pressure is stabilized Discussed this is going to be helpful long-term for cardiovascular and renal protection  She needs to start checking blood sugars consistently at least once a day and alternate fasting and after meals  Reduce CLONIDINE to 0.1 mg twice daily instead of 0.2 She will bring her home monitor for comparison, reportedly she has an [employer] monitor  Encouraged her to start walking for exercise when able to  Patient Instructions  Stay on Glimeperide 1 mg daily  Check blood sugars on waking up 3 days a week  Also check blood sugars about 2 hours after meals and do this after different meals by rotation  Recommended blood sugar levels on waking up are 90-130 and about 2 hours after meal is 130-160  Please bring your blood sugar monitor to each visit, thank you     Elayne Snare 02/04/2021, 11:37 AM   Note: This office note was prepared with Dragon voice recognition system technology. Any transcriptional errors that result from this process are unintentional.

## 2021-02-04 NOTE — Assessment & Plan Note (Signed)
As per AKI assessment

## 2021-02-04 NOTE — Assessment & Plan Note (Addendum)
Per cardiology, medical management warranted at this time pending normal liver function. Referral to lipid clinic is in place for management of lipids, who will decide whether to restart statin and/or aspirin.

## 2021-02-04 NOTE — Assessment & Plan Note (Addendum)
Discussed continued decrease in alcohol consumption. Discussed impacts of alcohol on her overall health.  Discussed healthy diet options aka food as medicine

## 2021-02-05 LAB — CBC WITH DIFFERENTIAL/PLATELET
Basophils Absolute: 0.1 10*3/uL (ref 0.0–0.2)
Basos: 1 %
EOS (ABSOLUTE): 0.1 10*3/uL (ref 0.0–0.4)
Eos: 1 %
Hematocrit: 33.3 % — ABNORMAL LOW (ref 34.0–46.6)
Hemoglobin: 10.3 g/dL — ABNORMAL LOW (ref 11.1–15.9)
Immature Grans (Abs): 0.1 10*3/uL (ref 0.0–0.1)
Immature Granulocytes: 1 %
Lymphocytes Absolute: 2.6 10*3/uL (ref 0.7–3.1)
Lymphs: 17 %
MCH: 22.2 pg — ABNORMAL LOW (ref 26.6–33.0)
MCHC: 30.9 g/dL — ABNORMAL LOW (ref 31.5–35.7)
MCV: 72 fL — ABNORMAL LOW (ref 79–97)
Monocytes Absolute: 0.9 10*3/uL (ref 0.1–0.9)
Monocytes: 6 %
Neutrophils Absolute: 11.7 10*3/uL — ABNORMAL HIGH (ref 1.4–7.0)
Neutrophils: 74 %
Platelets: 746 10*3/uL — ABNORMAL HIGH (ref 150–450)
RBC: 4.65 x10E6/uL (ref 3.77–5.28)
RDW: 19.4 % — ABNORMAL HIGH (ref 11.7–15.4)
WBC: 15.5 10*3/uL — ABNORMAL HIGH (ref 3.4–10.8)

## 2021-02-05 LAB — COMPREHENSIVE METABOLIC PANEL
ALT: 13 IU/L (ref 0–32)
AST: 13 IU/L (ref 0–40)
Albumin/Globulin Ratio: 1.1 — ABNORMAL LOW (ref 1.2–2.2)
Albumin: 4.1 g/dL (ref 3.8–4.9)
Alkaline Phosphatase: 108 IU/L (ref 44–121)
BUN/Creatinine Ratio: 20 (ref 9–23)
BUN: 15 mg/dL (ref 6–24)
Bilirubin Total: 0.2 mg/dL (ref 0.0–1.2)
CO2: 23 mmol/L (ref 20–29)
Calcium: 10.3 mg/dL — ABNORMAL HIGH (ref 8.7–10.2)
Chloride: 99 mmol/L (ref 96–106)
Creatinine, Ser: 0.75 mg/dL (ref 0.57–1.00)
Globulin, Total: 3.8 g/dL (ref 1.5–4.5)
Glucose: 106 mg/dL — ABNORMAL HIGH (ref 70–99)
Potassium: 4.1 mmol/L (ref 3.5–5.2)
Sodium: 144 mmol/L (ref 134–144)
Total Protein: 7.9 g/dL (ref 6.0–8.5)
eGFR: 93 mL/min/{1.73_m2} (ref >59)

## 2021-02-06 ENCOUNTER — Telehealth: Payer: Self-pay

## 2021-02-06 ENCOUNTER — Ambulatory Visit: Payer: Commercial Managed Care - PPO | Admitting: Surgery

## 2021-02-06 NOTE — Telephone Encounter (Signed)
-----   Message from [name], MD sent at 02/06/2021  8:18 AM EST ----- Let the pt know: liver function is better,  glucose is lower, blood counts show inflammation, doubt infection but likely due to gout in knee, no medication changes

## 2021-02-06 NOTE — Telephone Encounter (Signed)
Pt was called and vm was left, Information has been sent to nurse pool.   

## 2021-02-07 NOTE — Telephone Encounter (Signed)
Patient returned call and was notified of lab results. °

## 2021-02-08 ENCOUNTER — Other Ambulatory Visit: Payer: Self-pay

## 2021-02-08 ENCOUNTER — Ambulatory Visit
Admission: RE | Admit: 2021-02-08 | Discharge: 2021-02-08 | Disposition: A | Discharge status: Home / Self Care | Payer: Commercial Managed Care - PPO | Source: Ambulatory Visit | Attending: Surgery | Admitting: Surgery

## 2021-02-08 DIAGNOSIS — G8929 Other chronic pain: Secondary | ICD-10-CM

## 2021-02-08 DIAGNOSIS — M659 Synovitis and tenosynovitis, unspecified: Secondary | ICD-10-CM

## 2021-02-10 ENCOUNTER — Other Ambulatory Visit: Payer: Self-pay | Admitting: Orthopaedic Surgery

## 2021-02-10 MED ORDER — HYDROCODONE-ACETAMINOPHEN 5-325 MG PO TABS: 1.0000 | ORAL_TABLET | Freq: Four times a day (QID) | ORAL | 0 refills | Status: DC | PRN

## 2021-02-10 NOTE — Progress Notes (Unsigned)
Patient ID: Stacy [name]                 DOB: [birth year]                    MRN: [id number]     HPI: Stacy [name] is a 56 y.o. female patient referred to lipid clinic by Christen Bame, NP. PMH is significant for HTN, hyperlipidemia, GERD, T2DM, mild nonobstructive CAD, tobacco abuse, renal stenosis, normocytic anemia, and fatty liver.  She was found to have mild to moderate calcified plaque in the thoracic aortic and LAD by CT. She later underwent a coronary CT on 03/2019 for exertional dyspnea and was found to have mild plaque in the LAD and minimal plaque in the LCx. Her coronary CAC score was 50.4 (94th percentile for age/sex matched). Medical therapy recommended. She had mild elevation in LFTs on atorvastatin but they improved upon recheck so she was continued on statin therapy.   Most recently, she was admitted to [facility] ED on 12/19/20 with abdominal pain and knee pain- found to have acute renal failure with electrolyte imbalance. She had been self-treating for a gout flare. Medications were held while treated, including statin due to elevated transaminase. She was advised at discharge to avoid colchicine and NSAIDS in the future and sent for f/u with cardiology.  At follow up visit on 01/24/21, pt was still off atorvastatin 40 mg daily. Referred to lipid clinic to discuss alternative options  Plan: - assess AE with statins - try rosuvastatin 20 mg daily - then Zetia 10mg  daily - focus on diet with TGL - talk through other options with Vasce[facility] in future after labs - assess if back on baby aspirin (planned to initiate at next visit after 1/6)  Current Medications: none Intolerances: atorvastatin 20 mg, 40 mg daily Risk Factors: CAD, smoker, T2DM, HTN, family hx of early CAD, elevated CAC score LDL goal: <55 mg/dL  Diet:   Exercise:   Family History: Aneurysm in her sister; Blindness in her paternal grandmother; Cancer (age of onset: 85) in her sister; Coronary artery disease (age of  onset: 81) in her father; Diabetes in her sister; Heart attack in her maternal grandmother; Hypertension in her mother; Stroke in her mother. There is no history of Breast cancer, Colon cancer, Esophageal cancer, Rectal cancer, or Stomach cancer.  Social History: Drinks 14 drinks/week, current smoker - 1 ppd, 28 pack years  Labs: (06/19/20): TGL 230, HDL 42, LDL 75 (atorvastatin 40mg  daily)  (02/04/21): AST 13, ALT 13 (off statin) (12/27/20): AST: 42 ALT 87 (hospitalization w/ acute renal failure) (12/04/19): AST: 35 ALT: 29  Past Medical History:  Diagnosis Date   Acute bronchitis 08/02/2019   Adrenal adenoma    as per MRI 03/09/14   AKI (acute kidney injury) ([place]) 12/20/2020   Allergy    Anxiety    Coronary artery calcification seen on CAT scan 04/12/2018   Depression    Diabetes mellitus    Diverticulosis    Elevated LFTs    elevated since 09/2013   Fatty liver    moderate- as per 03/06/17 MRI liver   GERD (gastroesophageal reflux disease)    Gout    H/O hiatal hernia    Headache(784.0)    History of depression    Hyperlipemia    Hyperplastic colon polyp    Hypertension    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 Posey Pronto 02/27/2014   Tobacco abuse    Tubular adenoma of colon     Current Outpatient Medications on File Prior to Visit  Medication Sig Dispense Refill   predniSONE (STERAPRED UNI-PAK 21 TAB) 5 MG (21) TBPK tablet Take as directed on package (Patient not taking: Reported on 02/04/2021) 21 tablet 0   albuterol (VENTOLIN HFA) 108 (90 Base) MCG/ACT inhaler Inhale 2 puffs into the lungs every 4 (four) hours as needed for wheezing or shortness of breath. 6.7 g 1   allopurinol (ZYLOPRIM) 300 MG tablet Take 1 tablet (300 mg total) by mouth daily. 30 tablet 4   cloNIDine (CATAPRES) 0.1 MG tablet Take 1 tablet (0.1 mg total) by mouth 2  (two) times daily. 60 tablet 2   EPINEPHrine 0.3 mg/0.3 mL IJ SOAJ injection Insert into muscle once with any facial or neck swelling  Instruct in use 1 Device 1   glimepiride (AMARYL) 1 MG tablet TAKE 1 TABLET BY MOUTH DAILY BEFORE SUPPER. 60 tablet 3   glucose blood (FREESTYLE PRECISION NEO TEST) test strip Use as instructed 100 each 12   HYDROcodone-acetaminophen (NORCO/VICODIN) 5-325 MG tablet Take 1 tablet by mouth every 6 (six) hours as needed for moderate pain. 10 tablet 0   Lancets (FREESTYLE) lancets Use as instructed 100 each 12   metFORMIN (GLUCOPHAGE-XR) 500 MG 24 hr tablet Take 2 tablets (1,000 mg total) by mouth daily with supper. 120 tablet 3   Multiple Vitamins-Minerals (ONE-A-DAY WOMENS VITACRAVES) CHEW Chew 1 each by mouth daily.     nicotine (NICOTINE STEP 1) 21 mg/24hr patch Place 1 patch (21 mg total) onto the skin daily. 28 patch 0   nicotine polacrilex (NICOTINE MINI) 2 MG lozenge Use three times a day as needed for smoking cessation 100 tablet 0   pioglitazone (ACTOS) 15 MG tablet Take 1 tablet (15 mg total) by mouth daily. 60 tablet 3   spironolactone (ALDACTONE) 25 MG tablet Take 1 tablet (25 mg total) by mouth daily. 90 tablet 3   [DISCONTINUED] Lancets MISC Use as instructed to monitor FSBS 3x daily. Dx: E11.65 100 each 11   No current facility-administered medications on file prior to visit.    Allergies  Allergen Reactions   Almond Meal Anaphylaxis and Swelling   Other Anaphylaxis    Almond Butter   Aspirin Nausea Only    Assessment/Plan:  1. Hyperlipidemia -

## 2021-02-11 ENCOUNTER — Encounter: Payer: Self-pay | Admitting: Orthopaedic Surgery

## 2021-02-11 ENCOUNTER — Ambulatory Visit (INDEPENDENT_AMBULATORY_CARE_PROVIDER_SITE_OTHER): Payer: Commercial Managed Care - PPO | Admitting: Orthopaedic Surgery

## 2021-02-11 ENCOUNTER — Ambulatory Visit: Payer: Commercial Managed Care - PPO | Admitting: Pharmacist

## 2021-02-11 ENCOUNTER — Other Ambulatory Visit: Payer: Self-pay

## 2021-02-11 VITALS — BP 138/94 | HR 130 | Ht 63.0 in | Wt 138.0 lb

## 2021-02-11 DIAGNOSIS — M659 Synovitis and tenosynovitis, unspecified: Secondary | ICD-10-CM | POA: Diagnosis not present

## 2021-02-11 DIAGNOSIS — M65961 Unspecified synovitis and tenosynovitis, right lower leg: Secondary | ICD-10-CM

## 2021-02-11 MED ORDER — ZOLPIDEM TARTRATE ER 12.5 MG PO TBCR: 12.5000 mg | EXTENDED_RELEASE_TABLET | Freq: Every evening | ORAL | 0 refills | Status: DC | PRN

## 2021-02-11 NOTE — Telephone Encounter (Signed)
I called from rheumatology office, no answer. Patient in office now for return appt.

## 2021-02-11 NOTE — Progress Notes (Signed)
Office Visit Note   Patient: Stacy [name]           Date of Birth: 11/30/1965           MRN: [id number] Visit Date: 02/11/2021              Requested by: No referring provider defined for this encounter. PCP: Stacy Stain, MD   Assessment & Plan: Visit Diagnoses:  1. Synovitis of right knee     Plan: Discussed she may have some synovitis with gout in her right knee.  Injection performed with some improvement in pain she still on her crutches.  She will call when she is ready to resume work and we will send a work note which can be faxed for her.  She has not been able to sleep and Dr. Joya [name] recommended melatonin which has not worked.  10 tablets only prescribed of Ambien.  Her knee is painful she cannot sleep.  She may need to be on something for depression , we willleave that up to Dr. Joya [name].  Knee MRI shows some mild changes primarily synovitis.  Some changes around the collateral ligaments but she has not had a specific injury to her knee.  Follow-Up Instructions: Return in about 4 weeks (around 03/11/2021).   Orders:  No orders of the defined types were placed in this encounter.  Meds ordered this encounter  Medications   zolpidem (AMBIEN CR) 12.5 MG CR tablet    Sig: Take 1 tablet (12.5 mg total) by mouth at bedtime as needed for sleep.    Dispense:  10 tablet    Refill:  0      Procedures: Large Joint Inj: R knee on 02/13/2021 12:51 PM Indications: pain and joint swelling Details: 22 G 1.5 in needle, anterolateral approach  Arthrogram: No  Medications: 40 mg methylPREDNISolone acetate 40 MG/ML; 0.5 mL lidocaine 1 %; 4 mL bupivacaine 0.25 % Outcome: tolerated well, no immediate complications Procedure, treatment alternatives, risks and benefits explained, specific risks discussed. Consent was given by the patient. Immediately prior to procedure a time out was called to verify the correct patient, procedure, equipment, support staff and site/side marked as  required. Patient was prepped and draped in the usual sterile fashion.      Clinical Data: No additional findings.   Subjective: Chief Complaint  Patient presents with   Right Knee - Pain, Follow-up    MRI right knee review   Left Knee - Pain, Follow-up    HPI 56 year old female returns previous knee aspiration 01/23/2021.  She has had 2 aspirations with intracellular sodium urate crystals.  1 point her uric acid was in the 13-15 range.  She is on allopurinol.  She has significant pain in her right knee and is ambulating with crutches and cannot weight-bear without them.  She lacks 20 degrees reaching full knee extension with significant pain and tenderness history of alcohol dependence and smoking.  Type 2 diabetes.  Husband died suddenly in she has had problems with depression.  She is seeing Dr. Asencion [name] now as her PCP.  Long history of using Ambien.  She states melatonin has not worked and she has been up without sleep.  She cannot walk without the crutches she states she cannot work with her knee pain and cannot sleep.  Today she is tearful in the office. MRIs been obtained of her right knee and we reviewed that today. Review of Systems all other systems noncontributory to HPI.  Objective: Vital Signs: BP (!) 138/94    Pulse (!) 130    Ht 5\' 3"  (1.6 m)    Wt 138 lb (62.6 kg)    BMI 24.45 kg/m   Physical Exam Constitutional:      Appearance: She is well-developed.  HENT:     Head: Normocephalic.     Right Ear: External ear normal.     Left Ear: External ear normal. There is no impacted cerumen.  Eyes:     Pupils: Pupils are equal, round, and reactive to light.  Neck:     Thyroid: No thyromegaly.     Trachea: No tracheal deviation.  Cardiovascular:     Rate and Rhythm: Normal rate.  Pulmonary:     Effort: Pulmonary effort is normal.  Abdominal:     Palpations: Abdomen is soft.  Musculoskeletal:     Cervical back: No rigidity.  Skin:    General: Skin is warm and  dry.  Neurological:     Mental Status: She is alert and oriented to person, place, and time.  Psychiatric:        Behavior: Behavior normal.    Ortho Exam significant knee tenderness she cannot reach full extension she lacks 15 degrees.  Flexion only to 90 degrees.  Persistent pain despite distraction.  Negative logroll the hips distal pulses are intact trace knee effusion on the right none on the left.  Specialty Comments:  No specialty comments available.  Imaging: Narrative & Impression  CLINICAL DATA:  Chronic knee pain and swelling. Fell 1 month ago.   EXAM: MRI OF THE RIGHT KNEE WITHOUT CONTRAST   TECHNIQUE: Multiplanar, multisequence MR imaging of the knee was performed. No intravenous contrast was administered.   COMPARISON:  None.   FINDINGS: MENISCI   Medial: Intact.   Lateral: Intact.   LIGAMENTS   Cruciates: Intact ACL. ACL is expanded and increased in signal consistent with mucinous degeneration. Intact PCL.   Collaterals: Mild edema superficial to the MCL concerning for mild MCL strain. Iliotibial band and biceps femoris tendons are intact. Increased signal in the femoral insertion of the fibular collateral ligament concerning for ligament strain without a tear.   CARTILAGE   Patellofemoral: Partial-thickness cartilage loss of the lateral patellofemoral compartment.   Medial:  Chondral thinning of the medial femorotibial compartment.   Lateral: Severe cartilage thickening of the posterior nonweightbearing surface of the lateral femoral condyle.   JOINT: Large joint effusion with synovitis. Mild edema in Hoffa's fat. No plical thickening.   POPLITEAL FOSSA: Mild tendinosis of the popliteus tendon insertion. No Baker's cyst.   EXTENSOR MECHANISM: Intact quadriceps tendon. Intact patellar tendon. Intact lateral patellar retinaculum. Intact medial patellar retinaculum. Intact MPFL.   BONES: No aggressive osseous lesion. No fracture or  dislocation.   Other: No fluid collection or hematoma. Muscles are normal.   IMPRESSION: 1. No meniscal tear. 2. Mild MCL strain without a tear. 3. Increased signal in the femoral insertion of the fibular collateral ligament concerning for ligament strain without a tear. 4. 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 5. Large joint effusion with mild synovitis.     Electronically Signed   By: Kathreen Devoid M.D.   On: 02/09/2021 07:45       PMFS History: Patient Active Problem List   Diagnosis Date Noted   Need for Streptococcus pneumoniae vaccination 02/04/2021   Encounter for screening mammogram for malignant neoplasm of breast  02/04/2021   Synovitis of right knee 12/28/2020   Synovitis of left knee    Anemia 12/19/2020   Epidermoid cyst of ear 11/23/2019   Renal artery stenosis ([place]) 08/02/2019   Adenoma of right adrenal gland 01/16/2019   Coronary artery calcification seen on CAT scan 04/12/2018   Insomnia 01/01/2015   Fatty liver 01/01/2015   Thyroid nodule  managed Dr. Posey Pronto 02/27/2014   EtOH dependence ([place]) 11/22/2013   Tobacco use 10/04/2013   MDD (major depressive disorder) 08/23/2013   Colon polyp 08/23/2013   Migraines 08/23/2013   GERD (gastroesophageal reflux disease)    Depression with anxiety    Type 2 diabetes mellitus with other specified complication ([place])    Gout    Hypertension    Hyperlipemia    Past Medical History:  Diagnosis Date   Acute bronchitis 08/02/2019   Adrenal adenoma    as per MRI 03/09/14   AKI (acute kidney injury) (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 Posey Pronto 02/27/2014   Tobacco abuse    Tubular adenoma of colon     Family History  Problem Relation Age of Onset   Coronary artery disease Father 34       CAD,CABG   Hypertension Mother    Stroke Mother    Diabetes Sister        uncontrolled   Cancer Sister 65       uterine cancer   Aneurysm Sister        brain   Heart attack Maternal Grandmother    Blindness Paternal Grandmother    Breast cancer Neg Hx    Colon cancer Neg Hx    Esophageal cancer Neg Hx    Rectal cancer Neg Hx    Stomach cancer Neg Hx     Past Surgical History:  Procedure Laterality Date   ANAL FISTULECTOMY  01/19/1985   BREAST EXCISIONAL BIOPSY Left    BREAST LUMPECTOMY Left 1988   PAROTIDECTOMY  03/30/2011   Procedure: PAROTIDECTOMY;  Surgeon: Izora Gala, MD;  Location: [facility];  Service: ENT;  Laterality: Right;   WRIST GANGLION EXCISION Left 1988   Social History   Occupational History    Employer: [employer]  Tobacco Use   Smoking status: Every Day    Packs/day: 1.00    Years: 28.00    Pack years: 28.00    Types: Cigarettes   Smokeless tobacco: Never  Vaping Use   Vaping Use: Never used  Substance and Sexual Activity   Alcohol use: Yes    Alcohol/week: 14.0 standard drinks    Types: 14 Standard drinks or equivalent per week   Drug use: No   Sexual activity: Not Currently    Birth  control/protection: Post-menopausal

## 2021-02-13 ENCOUNTER — Encounter: Payer: Self-pay | Admitting: Orthopaedic Surgery

## 2021-02-13 ENCOUNTER — Encounter: Payer: Self-pay | Admitting: Critical Care Medicine

## 2021-02-13 ENCOUNTER — Other Ambulatory Visit: Payer: Self-pay | Admitting: Orthopaedic Surgery

## 2021-02-13 DIAGNOSIS — M659 Synovitis and tenosynovitis, unspecified: Secondary | ICD-10-CM

## 2021-02-13 DIAGNOSIS — M0580 Other rheumatoid arthritis with rheumatoid factor of unspecified site: Secondary | ICD-10-CM

## 2021-02-13 DIAGNOSIS — M109 Gout, unspecified: Secondary | ICD-10-CM

## 2021-02-13 MED ORDER — METHYLPREDNISOLONE ACETATE 40 MG/ML IJ SUSP
40.0000 mg | INTRAMUSCULAR | Status: AC | PRN
  Administered 2021-02-13: 40 mg via INTRA_ARTICULAR

## 2021-02-13 MED ORDER — LIDOCAINE HCL 1 % IJ SOLN
0.5000 mL | INTRAMUSCULAR | Status: AC | PRN
  Administered 2021-02-13: .5 mL

## 2021-02-13 MED ORDER — BUPIVACAINE HCL 0.25 % IJ SOLN
4.0000 mL | INTRAMUSCULAR | Status: AC | PRN
  Administered 2021-02-13: 4 mL via INTRA_ARTICULAR

## 2021-02-13 NOTE — Telephone Encounter (Signed)
noted 

## 2021-02-15 MED ORDER — ESCITALOPRAM OXALATE 5 MG PO TABS
5.0000 mg | ORAL_TABLET | Freq: Every day | ORAL | 2 refills | Status: DC
Start: 2021-02-15 — End: 2021-03-11

## 2021-02-15 NOTE — Telephone Encounter (Signed)
Asante see patient message, please call her to set up a depression therapy session,

## 2021-02-17 DIAGNOSIS — M0580 Other rheumatoid arthritis with rheumatoid factor of unspecified site: Secondary | ICD-10-CM | POA: Insufficient documentation

## 2021-02-17 MED ORDER — TRAZODONE HCL 50 MG PO TABS
ORAL_TABLET | ORAL | 3 refills | Status: DC
Start: 2021-02-17 — End: 2021-04-07

## 2021-02-17 NOTE — Addendum Note (Signed)
Addended by: [name] on: 02/17/2021 06:12 AM   Modules accepted: Orders

## 2021-02-17 NOTE — Addendum Note (Signed)
Addended by: Asencion Noble E on: 02/17/2021 01:34 PM   Modules accepted: Orders

## 2021-02-21 ENCOUNTER — Telehealth: Payer: Self-pay | Admitting: Clinical

## 2021-02-21 NOTE — Telephone Encounter (Signed)
I attempted to call pt per message from PCP. No answer, left vm.

## 2021-02-23 ENCOUNTER — Encounter: Payer: Self-pay | Admitting: Endocrinology

## 2021-02-23 ENCOUNTER — Encounter: Payer: Self-pay | Admitting: Orthopaedic Surgery

## 2021-02-25 ENCOUNTER — Other Ambulatory Visit: Payer: Self-pay

## 2021-02-25 ENCOUNTER — Ambulatory Visit (INDEPENDENT_AMBULATORY_CARE_PROVIDER_SITE_OTHER): Payer: Commercial Managed Care - PPO | Admitting: Orthopaedic Surgery

## 2021-02-25 ENCOUNTER — Encounter (HOSPITAL_BASED_OUTPATIENT_CLINIC_OR_DEPARTMENT_OTHER): Payer: Self-pay | Admitting: Cardiovascular Disease

## 2021-02-25 ENCOUNTER — Encounter: Payer: Self-pay | Admitting: Endocrinology

## 2021-02-25 ENCOUNTER — Encounter: Payer: Self-pay | Admitting: Orthopaedic Surgery

## 2021-02-25 VITALS — BP 122/85 | Ht 63.0 in | Wt 138.0 lb

## 2021-02-25 DIAGNOSIS — M659 Synovitis and tenosynovitis, unspecified: Secondary | ICD-10-CM | POA: Diagnosis not present

## 2021-02-27 ENCOUNTER — Other Ambulatory Visit: Payer: Self-pay

## 2021-02-27 DIAGNOSIS — E1169 Type 2 diabetes mellitus with other specified complication: Secondary | ICD-10-CM

## 2021-02-27 NOTE — Progress Notes (Signed)
Office Visit Note   Patient: Stacy [name]           Date of Birth: Oct 16, 1965           MRN: [id number] Visit Date: 02/25/2021              Requested by: [name], MD [address],  [place] [address] PCP: [name], MD   Assessment & Plan: Visit Diagnoses:  1. Synovitis of right knee     Plan: Patient needs the rheumatologist.  Her MRI showed some mild changes near the MCL suggesting a strain.  She had some changes in the posterior lateral femoral condyle suggestive of severe severe chondromalacia which does not show on x-ray.  She had more tenderness medial than lateral and has had intermittent synovitis certainly had gout 2 months ago.  Last cortisone injection in her knee has not helped her.  She will check her calendar and her appointment is 6 many weeks away she will call we will try to expedite rheumatology referral.  I do not think an additional cortisone injection today is likely to benefit her.  If rheumatologic labs are nonproductive then physical therapy would be the next step.  Follow-Up Instructions: No follow-ups on file.   Orders:  No orders of the defined types were placed in this encounter.  No orders of the defined types were placed in this encounter.     Procedures: No procedures performed   Clinical Data: No additional findings.   Subjective: Chief Complaint  Patient presents with   Right Knee - Pain   Left Knee - Pain    HPI 56 year old female returns with ongoing severe right knee pain which is requiring her to ambulate with crutches.  She had previous aspiration 1/5 and 01/26/2021.  Also 02/11/2021.  Patient had problems with ethanol had acute renal failure was in the hospital and had gout with aspiration of the knee with 35,000 cells on 12 to later 29,000 cells following day.  Last aspiration 01/23/2021 showed 3400 cells with 51 neutrophils 39 lymphs.  First aspiration showed intracellular monosodium urate crystals.  Patient  is having severe pain in her knee barely has range of motion with ambulation walking with a bent knee gait.  She has an appointment coming up with a rheumatologist but unsure if its within the next week or so or possibly at the end of March she states she has to check her calendar.  Last A1c was 7.6.  She states she has not been drinking.  She expressed that she needs to get back to work.  Cultures on the aspirate have been negative.  Sedimentation rate a month ago was 55 but she had acute renal failure at the time.  Creatinine is now returned to normal.  Uric acid 2 months ago was 15 and 1 month ago was 6.  She has had 2 positive ANA 7 years ago 4 years ago and 2 months ago and 1 month ago was negative.  Review of Systems all other systems noncontributory to HPI.   Objective: Vital Signs: BP 122/85    Ht 5\' 3"  (1.6 m)    Wt 138 lb (62.6 kg)    BMI 24.45 kg/m   Physical Exam Constitutional:      Appearance: She is well-developed.  HENT:     Head: Normocephalic.     Right Ear: External ear normal.     Left Ear: External ear normal. There is no impacted  cerumen.  Eyes:     Pupils: Pupils are equal, round, and reactive to light.  Neck:     Thyroid: No thyromegaly.     Trachea: No tracheal deviation.  Cardiovascular:     Rate and Rhythm: Normal rate.  Pulmonary:     Effort: Pulmonary effort is normal.  Abdominal:     Palpations: Abdomen is soft.  Musculoskeletal:     Cervical back: No rigidity.  Skin:    General: Skin is warm and dry.  Neurological:     Mental Status: She is alert and oriented to person, place, and time.  Psychiatric:        Behavior: Behavior normal.    Ortho Exam patient is ability to flex the knee past 90 in a sitting position with ambulation she has minimal range of motion of her knee keeps her knee slightly flexed with ambulation.  Pain with full extension some medial lateral joint line tenderness no palpable Baker's cyst negative logroll the hips distal pulses  are 2+ anterior tib EHL is intact no sciatic notch tenderness.  Specialty Comments:  No specialty comments available.  Imaging: No results found.   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 Posey Pronto 02/27/2014   EtOH dependence ([place]) 11/22/2013   Tobacco use 10/04/2013   MDD (major depressive disorder) 08/23/2013   Colon polyp 08/23/2013   Migraines 08/23/2013   GERD (gastroesophageal reflux disease)    Depression with anxiety    Type 2 diabetes mellitus with other specified complication ([facility])    Gout    Hypertension    Hyperlipemia    Past Medical History:  Diagnosis Date   Acute bronchitis 08/02/2019   Adrenal adenoma    as per MRI 03/09/14   AKI (acute kidney injury) ([place]) 12/20/2020   Allergy    Anxiety    Coronary artery calcification seen on CAT scan 04/12/2018   Depression    Diabetes mellitus    Diverticulosis    Elevated LFTs    elevated since 09/2013   Fatty liver    moderate- as per 03/06/17 MRI liver   GERD (gastroesophageal reflux disease)    Gout    H/O hiatal hernia    Headache(784.0)    History of depression    Hyperlipemia    Hyperplastic colon polyp    Hypertension    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 Posey Pronto 02/27/2014   Tobacco abuse     Tubular adenoma of colon     Family History  Problem Relation Age of Onset   Coronary artery disease Father 61       CAD,CABG   Hypertension Mother    Stroke Mother    Diabetes Sister  uncontrolled   Cancer Sister 58       uterine cancer   Aneurysm Sister        brain   Heart attack Maternal Grandmother    Blindness Paternal Grandmother    Breast cancer Neg Hx    Colon cancer Neg Hx    Esophageal cancer Neg Hx    Rectal cancer Neg Hx    Stomach cancer Neg Hx     Past Surgical History:  Procedure Laterality Date   ANAL FISTULECTOMY  01/19/1985   BREAST EXCISIONAL BIOPSY Left    BREAST LUMPECTOMY Left 1988   PAROTIDECTOMY  03/30/2011   Procedure: PAROTIDECTOMY;  Surgeon: Izora Gala, MD;  Location: [facility];  Service: ENT;  Laterality: Right;   WRIST GANGLION EXCISION Left 1988   Social History   Occupational History    Employer: [employer]  Tobacco Use   Smoking status: Every Day    Packs/day: 1.00    Years: 28.00    Pack years: 28.00    Types: Cigarettes   Smokeless tobacco: Never  Vaping Use   Vaping Use: Never used  Substance and Sexual Activity   Alcohol use: Yes    Alcohol/week: 14.0 standard drinks    Types: 14 Standard drinks or equivalent per week   Drug use: No   Sexual activity: Not Currently    Birth control/protection: Post-menopausal

## 2021-02-27 NOTE — Addendum Note (Signed)
Addended by: Kaylyn Lim I on: 02/27/2021 04:09 PM   Modules accepted: Orders

## 2021-02-28 NOTE — Telephone Encounter (Signed)
Mychart message sent to patient asking about labs. She has follow up with pharm D 03/13/2021

## 2021-03-05 NOTE — Telephone Encounter (Signed)
I attempted to contact pt again, no answer, left vm.

## 2021-03-07 NOTE — Telephone Encounter (Signed)
Third and final attempt to reach pt, no answer, left vm. Will send mychart message.

## 2021-03-10 ENCOUNTER — Other Ambulatory Visit: Payer: Self-pay | Admitting: Critical Care Medicine

## 2021-03-10 ENCOUNTER — Encounter: Payer: Self-pay | Admitting: Orthopaedic Surgery

## 2021-03-10 DIAGNOSIS — M7989 Other specified soft tissue disorders: Secondary | ICD-10-CM

## 2021-03-11 ENCOUNTER — Other Ambulatory Visit (HOSPITAL_COMMUNITY): Payer: Self-pay | Admitting: Orthopaedic Surgery

## 2021-03-11 ENCOUNTER — Ambulatory Visit (HOSPITAL_BASED_OUTPATIENT_CLINIC_OR_DEPARTMENT_OTHER)
Admission: RE | Admit: 2021-03-11 | Discharge: 2021-03-11 | Disposition: A | Discharge status: Home / Self Care | Payer: Commercial Managed Care - PPO | Source: Ambulatory Visit | Attending: Orthopaedic Surgery | Admitting: Orthopaedic Surgery

## 2021-03-11 ENCOUNTER — Other Ambulatory Visit: Payer: Self-pay

## 2021-03-11 ENCOUNTER — Telehealth: Payer: Self-pay | Admitting: Radiology

## 2021-03-11 ENCOUNTER — Ambulatory Visit (HOSPITAL_COMMUNITY)
Admission: RE | Admit: 2021-03-11 | Discharge: 2021-03-11 | Disposition: A | Discharge status: Home / Self Care | Payer: Commercial Managed Care - PPO | Source: Ambulatory Visit | Attending: Orthopaedic Surgery | Admitting: Orthopaedic Surgery

## 2021-03-11 DIAGNOSIS — I824Z1 Acute embolism and thrombosis of unspecified deep veins of right distal lower extremity: Secondary | ICD-10-CM | POA: Diagnosis not present

## 2021-03-11 DIAGNOSIS — M7989 Other specified soft tissue disorders: Secondary | ICD-10-CM | POA: Diagnosis not present

## 2021-03-11 MED ORDER — RIVAROXABAN (XARELTO) VTE STARTER PACK (15 & 20 MG): ORAL_TABLET | ORAL | 0 refills | Status: DC

## 2021-03-11 NOTE — Telephone Encounter (Signed)
Requested Prescriptions  Pending Prescriptions Disp Refills   escitalopram (LEXAPRO) 5 MG tablet [Pharmacy Med Name: ESCITALOPRAM 5 MG TABLET] 90 tablet 0    Sig: TAKE 1 TABLET (5 MG TOTAL) BY MOUTH DAILY.     Psychiatry:  Antidepressants - SSRI Passed - 03/10/2021 12:30 PM      Passed - Completed PHQ-2 or PHQ-9 in the last 360 days      Passed - Valid encounter within last 6 months    Recent Outpatient Visits          1 month ago Type 2 diabetes mellitus with other specified complication, without long-term current use of insulin [facility])   [place], MD      Future Appointments            In 2 days  [place]   In 3 weeks Joya Gaskins, Burnett Harry, MD [place]

## 2021-03-11 NOTE — Telephone Encounter (Signed)
Dr. Lorin Mercy spoke with Stacy [name] and advised positive for blood clot.  Per verbal order from Dr. Lorin Mercy, Xarelto starter pack sent to patient's pharmacy.   Could you advise on out of work note? For how long?

## 2021-03-11 NOTE — Telephone Encounter (Signed)
Note entered and available through My Chart. Will send by fax when patient calls with fax number.

## 2021-03-11 NOTE — Progress Notes (Signed)
Right lower extremity venous duplex and IVC/Iliac vein duplex completed. Refer to "CV Proc" under chart review to view preliminary results.  Preliminary results discussed with Dr. Lorin Mercy.  03/11/2021 11:56 AM Kelby Aline., MHA, RVT, RDCS, RDMS

## 2021-03-12 ENCOUNTER — Encounter: Payer: Self-pay | Admitting: Orthopaedic Surgery

## 2021-03-13 ENCOUNTER — Ambulatory Visit: Payer: Commercial Managed Care - PPO

## 2021-03-13 ENCOUNTER — Other Ambulatory Visit: Payer: Commercial Managed Care - PPO

## 2021-03-13 ENCOUNTER — Other Ambulatory Visit: Payer: Self-pay | Admitting: Critical Care Medicine

## 2021-03-13 NOTE — Telephone Encounter (Signed)
Refilled 02/17/2021 #60 with 3 refills. Should last until 05/2021.  Requested Prescriptions  Pending Prescriptions Disp Refills   traZODone (DESYREL) 50 MG tablet [Pharmacy Med Name: TRAZODONE 50 MG TABLET] 180 tablet 2    Sig: TAKE 1-2 TABLETS AS NEEDED FOR SLEEP     Psychiatry: Antidepressants - Serotonin Modulator Passed - 03/13/2021  2:31 PM      Passed - Completed PHQ-2 or PHQ-9 in the last 360 days      Passed - Valid encounter within last 6 months    Recent Outpatient Visits          1 month ago Type 2 diabetes mellitus with other specified complication, without long-term current use of insulin [facility])   [place], MD      Future Appointments            In 3 weeks [name], MD [place]

## 2021-03-14 ENCOUNTER — Other Ambulatory Visit: Payer: Self-pay | Admitting: Orthopaedic Surgery

## 2021-03-14 ENCOUNTER — Encounter: Payer: Self-pay | Admitting: Orthopaedic Surgery

## 2021-03-14 ENCOUNTER — Ambulatory Visit: Payer: Commercial Managed Care - PPO | Admitting: Orthopaedic Surgery

## 2021-03-14 MED ORDER — COLCHICINE 0.6 MG PO TABS: 0.6000 mg | ORAL_TABLET | Freq: Every day | ORAL | 0 refills | Status: DC | PRN

## 2021-03-14 NOTE — Progress Notes (Unsigned)
Co

## 2021-03-18 ENCOUNTER — Ambulatory Visit: Payer: Commercial Managed Care - PPO | Admitting: Endocrinology

## 2021-03-26 ENCOUNTER — Ambulatory Visit: Payer: Commercial Managed Care - PPO | Attending: Critical Care Medicine | Admitting: Clinical

## 2021-03-26 ENCOUNTER — Other Ambulatory Visit: Payer: Self-pay

## 2021-03-26 DIAGNOSIS — F1010 Alcohol abuse, uncomplicated: Secondary | ICD-10-CM | POA: Diagnosis not present

## 2021-03-26 DIAGNOSIS — F331 Major depressive disorder, recurrent, moderate: Secondary | ICD-10-CM | POA: Diagnosis not present

## 2021-03-26 NOTE — BH Specialist Note (Signed)
Integrated Behavioral Health Initial In-Person Visit ? ?MRN: [id number] ?Name: Stacy [name] ? ?Number of [place] Clinician visits: 1- Initial Visit ? ?Session Start time: 6712 ?   ?Session End time: 0930 ? ?Total time in minutes: 53 ? ? ?Types of Service: Individual psychotherapy ? ?Interpretor:No. Interpretor Name and Language: N/A ? ? Warm Hand Off Completed. ?  ? ?  ? ? ?Subjective: ?Stacy [name] is a 56 y.o. female accompanied by  self ?Patient was referred by Asencion Noble, MD for depression. ?Patient reports the following symptoms/concerns: Reports feeling depressed, trouble sleeping, decreased energy, self-esteem disturbances, decreased motivation, trouble concentrating, anxiousness, worrying, trouble relaxing, and irritability. Reports that her spouse died 7 years ago from a heart attack and she has difficulty accepting his death. Reports that her mother also died two years ago. Reports that she has felt depressed since losing her husband. Reports that she drinks 2 cups of alcohol daily since her husband died.  ?Duration of problem: 7 years; Severity of problem: moderate ? ?Objective: ?Mood: Depressed and Affect: Appropriate and Tearful ?Risk of harm to self or others: No plan to harm self or others ? ?Life Context: ?Family and Social: Pt has two adult children. Pt's son is staying with her.  ?School/Work: Pt is employed.  ?Self-Care: Pt drinks alcohol daily as coping skill. Pt wants to start back going to church.  ?Life Changes: Pt's husband died about 7 years ago from a heart attack and her mother died two years ago.  ? ?Patient and/or Family's Strengths/Protective Factors: ?Social and Emotional competence and Concrete supports in place (healthy food, safe environments, etc.) ? ?Goals Addressed: ?Patient will: ?Reduce symptoms of: anxiety and depression ?Increase knowledge and/or ability of: coping skills and healthy habits  ?Demonstrate ability to: Increase healthy adjustment  to current life circumstances and Decrease self-medicating behaviors ? ?Progress towards Goals: ?Ongoing ? ?Interventions: ?Interventions utilized: Optician, dispensing, CBT Cognitive Behavioral Therapy, and Supportive Counseling  ?Standardized Assessments completed: AUDIT, GAD-7, and PHQ 9 ?GAD 7 : Generalized Anxiety Score 03/26/2021 02/04/2021  ?Nervous, Anxious, on Edge 3 3  ?Control/stop worrying 3 2  ?Worry too much - different things 3 1  ?Trouble relaxing 2 1  ?Restless 0 1  ?Easily annoyed or irritable 3 2  ?Afraid - awful might happen 3 2  ?Total GAD 7 Score 17 12  ? ?  ?Depression screen [facility] 2/9 03/26/2021 02/04/2021 01/23/2019 10/03/2018 01/25/2017  ?Decreased Interest '2 2 1 1 '$ 0  ?Down, Depressed, Hopeless '3 2 1 1 1  '$ ?PHQ - 2 Score '5 4 2 2 1  '$ ?Altered sleeping '3 3 2 3 3  '$ ?Tired, decreased energy '3 1 2 2 3  '$ ?Change in appetite '3 1 2 1 3  '$ ?Feeling bad or failure about yourself  '2 1 1 1 '$ 0  ?Trouble concentrating '3 1 1 1 1  '$ ?Moving slowly or fidgety/restless 0 0 0 1 0  ?Suicidal thoughts 0 0 0 0 0  ?PHQ-9 Score '19 11 10 11 11  '$ ?Difficult doing work/chores - - Somewhat difficult Somewhat difficult Not difficult at all  ?Some recent data might be hidden  ? ?[place] from 03/26/2021 in [place]  ?Alcohol Use Disorder Identification Test Final Score (AUDIT) 17  ? ?  ?  ?Patient and/or Family Response: Pt receptive to tx. Pt receptive to psychoeducation provided on depression, grief, and alcohol use. Pt receptive to cognitive restructuring to decrease unhelpful thoughts.  Pt receptive to decreasing alcohol use to one cup/day. Pt receptive to grief journaling, spending time outside, and deep breathing.  ? ?Patient Centered Plan: ?Patient is on the following Treatment Plan(s):  Depression and grief ? ?Assessment: ?Denies SI/HI. Denies auditory/visual hallucinations. Patient currently experiencing depression associated with grief. Pt also experiences  problems with alcohol use as a result of grief. Pt has difficulty with processing her feelings related to losing her husband and tries to escape her feelings. ?  ?Patient may benefit from grief journaling. LCSW provided psychoeducation on depression, grief, and alcohol use. LCSW utilized cognitive restructuring to decrease unhelpful thoughts. LCSW encouraged pt to decrease alcohol use to one cup/day. LCSW encouraged pt to utilize grief journaling, spend time outside, and deep breathing. LCSW discussed outpatient therapy with pt and she believes it will be beneficial. LCSW will refer pt. ? ?Plan: ?Follow up with behavioral health clinician on : 04/16/21 ?Behavioral recommendations: Utilize deep breathing, grief journaling, spend time outside, and decrease alcohol use to one cup/day.  ?Referral(s): [place] (In Clinic) and Counselor ?"From scale of 1-10, how likely are you to follow plan?": 10 ? ?Dulse Rutan C Nehemiah Montee, LCSW ? ? ? ? ? ? ? ? ?

## 2021-03-28 ENCOUNTER — Other Ambulatory Visit: Payer: Self-pay

## 2021-03-28 ENCOUNTER — Other Ambulatory Visit: Payer: Self-pay | Admitting: Endocrinology

## 2021-04-02 ENCOUNTER — Ambulatory Visit: Payer: Commercial Managed Care - PPO | Admitting: Internal Medicine

## 2021-04-02 NOTE — Progress Notes (Deleted)
? ?Office Visit Note ? ?Patient: Stacy [name]             ?Date of Birth: 11-03-1965           ?MRN: [id number]             ?PCP: [name], MD ?Referring: [name], MD ?Visit Date: 04/02/2021 ?Occupation: _0 @ ? ?Subjective:  ?No chief complaint on file. ? ? ?History of Present Illness: Stacy [name] is a 56 y.o. female here for evaluation of knee pain with history of gouty arthritis also positive rheumatoid factor. She was found to have gout flare in the knees at hospitalization last year with AKI treated with steroids and is prescribed allopurinol 300 mg daily.***  ? ?Labs reviewed ?01/2021 ?ANA neg ?RF neg ?HLA-B27 neg ?ESR 55 ?Uric acid 6.0 ?Synovial fluid WBC 3,473, extracellular MSU ? ?12/2020 ?ANA neg ?RF 15.5 ?Uric acid 15.1 ?Synovial fluid WBC 35,389, intracellular MSU ? ?IMPRESSION: ?1. No meniscal tear. ?2. Mild MCL strain without a tear. ?3. Increased signal in the femoral insertion of the fibular collateral ligament concerning for ligament strain without a tear. ?4. 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 ?5. Large joint effusion with mild synovitis. ? ?Activities of Daily Living:  ?Patient reports morning stiffness for *** {minute/hour:19697}.   ?Patient {ACTIONS;DENIES/REPORTS:21021675::"Denies"} nocturnal pain.  ?Difficulty dressing/grooming: {ACTIONS;DENIES/REPORTS:21021675::"Denies"} ?Difficulty climbing stairs: {ACTIONS;DENIES/REPORTS:21021675::"Denies"} ?Difficulty getting out of chair: {ACTIONS;DENIES/REPORTS:21021675::"Denies"} ?Difficulty using hands for taps, buttons, cutlery, and/or writing: {ACTIONS;DENIES/REPORTS:21021675::"Denies"} ? ?No Rheumatology ROS completed.  ? ?PMFS History:  ?Patient Active Problem List  ? Diagnosis Date Noted  ? 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 Need for Streptococcus pneumoniae vaccination 02/04/2021  ?  Encounter for screening mammogram for malignant neoplasm of breast 02/04/2021  ? Synovitis of right knee 12/28/2020  ? Synovitis of left knee   ? Anemia 12/19/2020  ? Epidermoid cyst of ear 11/23/2019  ? Renal artery stenosis ([place]) 08/02/2019  ? Adenoma of right adrenal gland 01/16/2019  ? Coronary artery calcification seen on CAT scan 04/12/2018  ? Insomnia 01/01/2015  ? Fatty liver 01/01/2015  ? Thyroid nodule  managed Dr. Posey Pronto 02/27/2014  ? EtOH dependence ([place]) 11/22/2013  ? Tobacco use 10/04/2013  ? MDD (major depressive disorder) 08/23/2013  ? Colon polyp 08/23/2013  ? Migraines 08/23/2013  ? GERD (gastroesophageal reflux disease)   ? Depression with anxiety   ? Type 2 diabetes mellitus with other specified complication ([place])   ? Gout   ? Hypertension   ? Hyperlipemia   ?  ?Past Medical History:  ?Diagnosis Date  ? Acute bronchitis 08/02/2019  ? Adrenal adenoma   ? as per MRI 03/09/14  ? AKI (acute kidney injury) ([place]) 12/20/2020  ? Allergy   ? Anxiety   ? Coronary artery calcification seen on CAT scan 04/12/2018  ? Depression   ? Diabetes mellitus   ? Diverticulosis   ? Elevated LFTs   ? elevated since 09/2013  ? Fatty liver   ? moderate- as per 03/06/17 MRI liver  ? GERD (gastroesophageal reflux disease)   ? Gout   ? H/O hiatal hernia   ? Headache(784.0)   ? History of depression   ? Hyperlipemia   ? Hyperplastic colon polyp   ? Hypertension   ? 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 Positive hepatitis C antibody test   ? RNA NEGATIVE  ? Renal artery stenosis ([place]) 08/02/2019  ?  06/2019: 1-59% stenosis on L   ? Thyroid nodule  managed Dr. Posey Pronto 02/27/2014  ? Tobacco abuse   ? Tubular adenoma of colon   ?  ?Family History  ?Problem Relation Age of Onset  ? Coronary artery disease Father 72  ?     CAD,CABG  ? Hypertension Mother   ? Stroke Mother   ? Diabetes Sister   ?     uncontrolled  ? Cancer Sister 31  ?     uterine cancer  ? Aneurysm Sister   ?     brain  ? Heart attack Maternal Grandmother   ? Blindness Paternal  Grandmother   ? Breast cancer Neg Hx   ? Colon cancer Neg Hx   ? Esophageal cancer Neg Hx   ? Rectal cancer Neg Hx   ? Stomach cancer Neg Hx   ? ?Past Surgical History:  ?Procedure Laterality Date  ? ANAL FISTULECTOMY  01/19/1985  ? BREAST EXCISIONAL BIOPSY Left   ? BREAST LUMPECTOMY Left 1988  ? PAROTIDECTOMY  03/30/2011  ? Procedure: PAROTIDECTOMY;  Surgeon: Izora Gala, MD;  Location: [facility];  Service: ENT;  Laterality: Right;  ? [place]  ? ?Social History  ? ?Social History Narrative  ? Married. 2 children.   ? Ages 22, 32 y/o ( entered 09/2012)  ?   ? Works with [employer]  ? ?Immunization History  ?Administered Date(s) Administered  ? Influenza,inj,Quad PF,6+ Mos 02/01/2014  ? Influenza-Unspecified 01/16/2019, 11/06/2020  ? Moderna Sars-Covid-2 Vaccination 01/27/2019, 02/24/2019  ? PNEUMOCOCCAL CONJUGATE-20 02/04/2021  ?  ? ?Objective: ?Vital Signs: There were no vitals taken for this visit.  ? ?Physical Exam  ? ?Musculoskeletal Exam: *** ? ?CDAI Exam: ?CDAI Score: -- ?Patient Global: --; Provider Global: -- ?Swollen: --; Tender: -- ?Joint Exam 04/02/2021  ? ?No joint exam has been documented for this visit  ? ?There is currently no information documented on the homunculus. Go to the Rheumatology activity and complete the homunculus joint exam. ? ?Investigation: ?No additional findings. ? ?Imaging: ?VAS [place] IVC/ILIAC (VENOUS ONLY) ? ?Result Date: 03/11/2021 ?IVC/ILIAC STUDY Patient Name:  Stacy [name]  Date of Exam:   03/11/2021 Medical Rec #: [id number]        Accession #:    [id number] Date of Birth: [birth year]         Patient Gender: F Patient Age:   40 years Exam Location:  [facility] Procedure:      VAS [place] IVC/ILIAC (VENOUS ONLY) Referring Phys: Elta Guadeloupe YATES --------------------------------------------------------------------------------  Indications: Extensive right lower extremity DVT  Comparison Study: No prior study Performing  Technologist: Maudry Mayhew MHA, RDMS, RVT, RDCS  Examination Guidelines: A complete evaluation includes B-mode imaging, spectral Doppler, color Doppler, and power Doppler as needed of all accessible portions of each vessel. Bilateral testing is considered an integral part of a complete examination. Limited examinations for reoccurring indications may be performed as noted.  IVC/Iliac Findings: +----------+------+--------+--------+    IVC    PatentThrombusComments +----------+------+--------+--------+ IVC Prox  patent                 +----------+------+--------+--------+ IVC Mid   patent                 +----------+------+--------+--------+ IVC Distalpatent                 +----------+------+--------+--------+  +----------------+---------+-----------+---------+-----------+--------+       CIV  RT-PatentRT-ThrombusLT-PatentLT-ThrombusComments +----------------+---------+-----------+---------+-----------+--------+ Common Iliac Mid patent              patent                      +----------------+---------+-----------+---------+-----------+--------+  +-------------------------+---------+-----------+---------+-----------+--------+            EIV           RT-PatentRT-ThrombusLT-PatentLT-ThrombusComments +-------------------------+---------+-----------+---------+-----------+--------+ External Iliac Vein                  acute    patent                      Distal                                                                    +-------------------------+---------+-----------+---------+-----------+--------+   Summary: IVC/Iliac: There is no evidence of thrombus involving the IVC. There is no evidence of thrombus involving the right common iliac vein and left common iliac vein. There is no evidence of thrombus involving the left external iliac vein. There is evidence of partially occlusive acute thrombus involving the distal right external iliac vein.  *See  table(s) above for measurements and observations.  Electronically signed by Servando Snare MD on 03/11/2021 at 2:15:33 PM.    Final   ? ?VAS [place] LOWER EXTREMITY VENOUS (DVT) ? ?Result Date: 03/11/2021 ? Lower Venous DVT Study Patient Name:  Stacy [name]  Date of Exam:

## 2021-04-06 NOTE — Progress Notes (Signed)
? ?NEW Patient Office Visit ? ?Subjective:  ?Patient ID: Stacy [name], female    DOB: 01/17/1966  Age: 56 y.o. MRN: [id number] ? ?CC:  ?No chief complaint on file. ? ? ?HPI ?Stacy [name] presents for follow up after hospitalization for an AKI/acute renal failure and electrolyte imbalance that resulted from severe vomiting. She was concurrently treated for an acute gout flare at that time. While in the hospital, she received nephrology and orthopedics consult. Her kidney function improved during her hospital stay, and she was cleared by nephrology for discharge. She received bilateral knee arthrocentesis and steroid injection of her knees to treat her gout. She is in need of an MRI for her knees, and has just gotten insurance approval for this procedure. She is attempting to schedule the MRI. The following includes the summary from discharge:  ? ?"56 y.o. female with medical history significant of HTN, HLD, GERD, T2DM, depression with anxiety, gout, nonobstructive CAD, left renal artery stenosis, tobacco abuse treated with abdominal pain, nausea and vomiting and not feeling well along with bilateral knee pain.  On presentation, she was slightly hypotensive, tachycardic.  WBC of 12.4; BUN of 77, creatinine of 9.5, lipase of 62.  Chest x-ray showed no acute findings.  Ultrasound of abdomen showed no acute findings to explain her abdominal pain.  She was started on IV fluids.  Nephrology was consulted.  During the hospitalization, her condition has gradually improved; creatinine is improving.  Orthopedics was consulted and patient underwent bilateral knee arthrocentesis and steroid injection; synovial fluid was consistent with gout flare.  She was also treated with oral prednisone.  Currently her renal function has much improved, nephrology has cleared the patient for discharge.  Currently she is hemodynamically stable, tolerating diet.  She will be discharged home today on oral prednisone and allopurinol will be  started.  Follow-up with PCP and orthopedics.  Follow-up with nephrology if needed." ? ?Today, the patient states that she has improved since her hospital stay. She has had follow ups with multiple providers for her other medical issues. She followed up with cardiology on 01/24/21 and was started on spironolactone. She has a diagnosis of mild nonobstructive CAD that is being medically managed at this point in time. She previously took a statin for this issue, but it was stopped during hospitalization due to elevated liver transaminase. She has been referred to the lipid clinic for further management of this issue. She followed up with endocrinology for her diabetes earlier today. She is currently on Amaryl and Actos, and metformin was added back to her regimen today. She checks her blood sugars in the morning, and reports they have typically been between 105-110 mg/dL.  ? ?Stacy [name] gout symptoms remain troublesome. She is having significant pain in her right knee from the gout flare. She is taking Vicodin for this. She is unable to do any weight-bearing on this leg. She also has pain from the gout flare in her right foot. Her other concern today is that she is having significant trouble with sleeping. She has difficulty falling asleep and staying asleep, and this has been significantly impacting her mood. She has experienced racing thoughts when trying to fall asleep, and she has felt down and depressed. ? ?The patient states that she is drinking less alcohol than previously. She now drinks two whiskeys 3-4 times per week, rather than every day. She still smokes, less than 1 PPD. She states she has been smoking a little more previously due to  feelings of stress. ? ?Notes reviewed below :  Cardiology OV 01/24/21.  [place] from early 12/2020 ? ?Admit date: 12/19/2020 ?Discharge date: 12/22/2020 ?  ?Admitted From: Home ?Disposition: Home ?  ?Recommendations for Outpatient Follow-up:  ?Follow up with PCP in 1 week with  repeat CBC/BMP ?Outpatient follow-up with nephrology if needed ?Outpatient follow-up with orthopedics/Dr. Lorin Mercy ?Follow up in ED if symptoms worsen or new appear ?  ?  ?Home Health: No ?Equipment/Devices: None ?  ?Discharge Condition: Stable ?CODE STATUS: Full ?Diet recommendation: Heart healthy/carb modified ?  ?Brief/Interim Summary: ?56 y.o. female with medical history significant of HTN, HLD, GERD, T2DM, depression with anxiety, gout, nonobstructive CAD, left renal artery stenosis, tobacco abuse treated with abdominal pain, nausea and vomiting and not feeling well along with bilateral knee pain.  On presentation, she was slightly hypotensive, tachycardic.  WBC of 12.4; BUN of 77, creatinine of 9.5, lipase of 62.  Chest x-ray showed no acute findings.  Ultrasound of abdomen showed no acute findings to explain her abdominal pain.  She was started on IV fluids.  Nephrology was consulted.  During the hospitalization, her condition has gradually improved; creatinine is improving.  Orthopedics was consulted and patient underwent bilateral knee arthrocentesis and steroid injection; synovial fluid was consistent with gout flare.  She was also treated with oral prednisone.  Currently her renal function has much improved, nephrology has cleared the patient for discharge.  Currently she is hemodynamically stable, tolerating diet.  She will be discharged home today on oral prednisone and allopurinol will be started.  Follow-up with PCP and orthopedics.  Follow-up with nephrology if needed. ?  ?Discharge Diagnoses:  ?  ?Acute kidney injury/acute renal failure ?Anion gap metabolic acidosis ?-Possibly from ATN from dehydration and hypotension ?-Presented with creatinine of 9.5; treated with IV fluids.  Creatinine much improved to 1.57 today.  ?- Renal ultrasound negative for hydronephrosis  ?-nephrology following and has cleared the patient for discharge.  Outpatient follow-up with nephrology if needed. ?-Outpatient follow-up  of BMP by PCP ?  ?Possible UTI ?-Present on admission.  Treated with 3 days of Rocephin.  No need for any further antibiotics on discharge.  Currently afebrile. ?  ?Bilateral knee pain and swelling/?  Acute gout flare ?-Patient states that she has had similar symptoms in the past requiring arthrocentesis which was consistent with acute gout.   ?- Avoid colchicine because of recent nausea and vomiting ?-Status post bilateral arthrocentesis by orthopedics on 12/20/2020: Synovial fluid analysis consistent with acute gout.  Bilateral knees were injected with steroids per orthopedics on 12/20/2020. ?-Continue oral prednisone on discharge 40 mg daily for 7 days.  Outpatient follow-up with orthopedics.   ?  ?Nausea/vomiting ?-Questionable cause.  Ultrasound abdomen was negative for acute cause/gallbladder disease. ?-Much improved.  Currently tolerating diet. ?  ?Right upper quadrant pain/shoulder pain ?-Might be musculoskeletal as well.  Lipase only mildly elevated.  Troponin negative.  Continue current pain management. ?  ?Hypomagnesemia ?-Resolved ?  ?Mild hyperkalemia ?-Resolved ?  ?Leukocytosis ?-Possibly reactive.  Resolved ?  ?Renal artery stenosis ?-Renal duplex showed 1 to 59% stenosis of left renal 3.;  No evidence of right renal artery stenosis. ?  ?Normocytic anemia ?-Questionable cause.  Hemoglobin stable.  Outpatient follow-up ?  ?Fatty liver  ?Mildly elevated AST ?-Possibly from alcohol use.  Outpatient follow-up with GI.  Abstain from alcohol ?  ?Hypertension ?-Blood pressure on the lower side.  Antihypertensives on hold ?  ?Hyperlipidemia ?-Statin on hold because of slightly  elevated LFTs.  Outpatient follow-up with PCP. ?  ?GERD ?-Has completed Protonix treatment as an outpatient.  Follow-up with PCP. ?  ?Depression with anxiety ?-Stable.  Continue Remeron ?  ?Adrenal mass, right ?-Has outpatient follow-up ?  ?Tobacco use ?-Continue nicotine patch.  Patient was counseled regarding cessation by admitting  hospitalist ? ?Cards 1/6 ?   ?Essential hypertension: Medications were held during hospitalization 12/22 due to acute renal failure and hypotension. She was restarted on clonidine 0.2 mg 3 times a day.  [facility]

## 2021-04-07 ENCOUNTER — Encounter: Payer: Self-pay | Admitting: Critical Care Medicine

## 2021-04-07 ENCOUNTER — Other Ambulatory Visit: Payer: Self-pay

## 2021-04-07 ENCOUNTER — Ambulatory Visit: Payer: Commercial Managed Care - PPO | Attending: Critical Care Medicine | Admitting: Critical Care Medicine

## 2021-04-07 VITALS — BP 138/90 | HR 95

## 2021-04-07 DIAGNOSIS — G4709 Other insomnia: Secondary | ICD-10-CM

## 2021-04-07 DIAGNOSIS — I82411 Acute embolism and thrombosis of right femoral vein: Secondary | ICD-10-CM

## 2021-04-07 DIAGNOSIS — E1169 Type 2 diabetes mellitus with other specified complication: Secondary | ICD-10-CM

## 2021-04-07 DIAGNOSIS — I10 Essential (primary) hypertension: Secondary | ICD-10-CM | POA: Diagnosis not present

## 2021-04-07 DIAGNOSIS — E7800 Pure hypercholesterolemia, unspecified: Secondary | ICD-10-CM

## 2021-04-07 DIAGNOSIS — Z1231 Encounter for screening mammogram for malignant neoplasm of breast: Secondary | ICD-10-CM

## 2021-04-07 DIAGNOSIS — K760 Fatty (change of) liver, not elsewhere classified: Secondary | ICD-10-CM | POA: Diagnosis not present

## 2021-04-07 DIAGNOSIS — F331 Major depressive disorder, recurrent, moderate: Secondary | ICD-10-CM

## 2021-04-07 DIAGNOSIS — K635 Polyp of colon: Secondary | ICD-10-CM

## 2021-04-07 DIAGNOSIS — Z720 Tobacco use: Secondary | ICD-10-CM

## 2021-04-07 DIAGNOSIS — Z7901 Long term (current) use of anticoagulants: Secondary | ICD-10-CM

## 2021-04-07 DIAGNOSIS — M0580 Other rheumatoid arthritis with rheumatoid factor of unspecified site: Secondary | ICD-10-CM | POA: Diagnosis not present

## 2021-04-07 DIAGNOSIS — I2510 Atherosclerotic heart disease of native coronary artery without angina pectoris: Secondary | ICD-10-CM

## 2021-04-07 HISTORY — DX: Acute embolism and thrombosis of right femoral vein: I82.411

## 2021-04-07 LAB — GLUCOSE, POCT (MANUAL RESULT ENTRY): POC Glucose: 152 mg/dl — AB (ref 70–99)

## 2021-04-07 MED ORDER — METFORMIN HCL ER 500 MG PO TB24: 1000.0000 mg | ORAL_TABLET | Freq: Every day | ORAL | 3 refills | Status: DC

## 2021-04-07 MED ORDER — TRAZODONE HCL 100 MG PO TABS: ORAL_TABLET | ORAL | 2 refills | Status: DC

## 2021-04-07 MED ORDER — COLCHICINE 0.6 MG PO TABS: 0.6000 mg | ORAL_TABLET | Freq: Every day | ORAL | 0 refills | Status: DC | PRN

## 2021-04-07 MED ORDER — ESCITALOPRAM OXALATE 5 MG PO TABS: 5.0000 mg | ORAL_TABLET | Freq: Every day | ORAL | 0 refills | Status: DC

## 2021-04-07 MED ORDER — SPIRONOLACTONE 25 MG PO TABS: 25.0000 mg | ORAL_TABLET | Freq: Every day | ORAL | 3 refills | Status: DC

## 2021-04-07 MED ORDER — ALBUTEROL SULFATE HFA 108 (90 BASE) MCG/ACT IN AERS: 2.0000 | INHALATION_SPRAY | RESPIRATORY_TRACT | 1 refills | Status: DC | PRN

## 2021-04-07 MED ORDER — PIOGLITAZONE HCL 15 MG PO TABS: 15.0000 mg | ORAL_TABLET | Freq: Every day | ORAL | 3 refills | Status: DC

## 2021-04-07 MED ORDER — GLIMEPIRIDE 1 MG PO TABS: ORAL_TABLET | ORAL | 3 refills | Status: DC

## 2021-04-07 MED ORDER — ALLOPURINOL 300 MG PO TABS: 300.0000 mg | ORAL_TABLET | Freq: Every day | ORAL | 4 refills | Status: DC

## 2021-04-07 MED ORDER — RIVAROXABAN 20 MG PO TABS
20.0000 mg | ORAL_TABLET | Freq: Every day | ORAL | 3 refills | Status: DC
Start: 2021-04-07 — End: 2021-07-21

## 2021-04-07 NOTE — Assessment & Plan Note (Signed)
Because of liver disease statins were not given she has follow-up visit with pharmacist in lipid clinic ?

## 2021-04-07 NOTE — Assessment & Plan Note (Signed)
Plan continue counseling with clinical social work ?

## 2021-04-07 NOTE — Assessment & Plan Note (Signed)
Assess CBC this visit continue Xarelto for indefinite treatment. ?

## 2021-04-07 NOTE — Assessment & Plan Note (Signed)
Plan increase dose to 100 mg of trazodone nightly handout on insomnia management given ?

## 2021-04-07 NOTE — Assessment & Plan Note (Signed)
As per Dr. [name] we will refill the Actos glimepiride and metformin ? ?Patient needs to eat at least 3 meals a day and advised with Actos and glimepiride the risk for hypoglycemia if she does not eat regularly ?

## 2021-04-07 NOTE — Assessment & Plan Note (Signed)
Patient will need repeat colonoscopy in 3 years ?

## 2021-04-07 NOTE — Assessment & Plan Note (Signed)
This study has been ordered the patient had to cancel her appointment she will reschedule ?

## 2021-04-07 NOTE — Assessment & Plan Note (Signed)
I went over the patient a healthy diet and also importance of alcohol abstinence ?

## 2021-04-07 NOTE — Assessment & Plan Note (Signed)
Discussed smoking cessation. Rx sent for nicotine patches and lozenges. Referral to [place], LCSW for behavioral therapy.  ? ? ? ? ?? Current smoking consumption amount: 1 ppd ? ?? Dicsussion on advise to quit smoking and smoking impacts: CV lung impacts ? ?? Patient's willingness to quit:  Interested  ? ?? Methods to quit smoking discussed:  Behavioral mod /meds ? ?? Medication management of smoking session drugs discussed:nicotine replacement: combo of nicotine patch and lozenge ? ?? Resources provided:  AVS  ? ?? Setting quit date not established ? ?? Follow-up arranged 2 mo and LCSW ref for counseling ? ? ?Time spent counseling the patient:  5 min ? ?

## 2021-04-07 NOTE — Patient Instructions (Signed)
No change in medications refill sent to [facility] I sent refills on all your medications under my name ? ?Complete set of screening labs obtained at this visit ? ?I sent a refill on the Xarelto to get when the starter pack is complete it will be a 20 mg tablet daily did not pick this up till the starter pack is finished ? ?Keep your upcoming appointments for your mammogram, lipid clinic ? ?Please get an eye exam this calendar year performed ? ?Focus on smoking cessation use the nicotine products we have given you before see attachment ? ?Stay off clonidine for now stay on the Aldactone daily ? ?Trazodone was increased to 100 mg daily for sleep see attachment for sleep hygiene ? ?See lifestyle medicine handout as well focus on the sleep section as well as the diet section ? ?Continue to focus on reducing your alcohol intake ? ?Return to Dr. Joya Gaskins 2 months ? ? ?

## 2021-04-07 NOTE — Assessment & Plan Note (Signed)
Patient has a pending appointment with the lipid clinic encouraged to keep this appointment ?

## 2021-04-08 ENCOUNTER — Telehealth: Payer: Self-pay

## 2021-04-08 LAB — COMPREHENSIVE METABOLIC PANEL
ALT: 35 IU/L — ABNORMAL HIGH (ref 0–32)
AST: 44 IU/L — ABNORMAL HIGH (ref 0–40)
Albumin/Globulin Ratio: 1.2 (ref 1.2–2.2)
Albumin: 4.1 g/dL (ref 3.8–4.9)
Alkaline Phosphatase: 123 IU/L — ABNORMAL HIGH (ref 44–121)
BUN/Creatinine Ratio: 25 — ABNORMAL HIGH (ref 9–23)
BUN: 16 mg/dL (ref 6–24)
Bilirubin Total: 0.3 mg/dL (ref 0.0–1.2)
CO2: 26 mmol/L (ref 20–29)
Calcium: 10 mg/dL (ref 8.7–10.2)
Chloride: 96 mmol/L (ref 96–106)
Creatinine, Ser: 0.64 mg/dL (ref 0.57–1.00)
Globulin, Total: 3.3 g/dL (ref 1.5–4.5)
Glucose: 121 mg/dL — ABNORMAL HIGH (ref 70–99)
Potassium: 3.7 mmol/L (ref 3.5–5.2)
Sodium: 137 mmol/L (ref 134–144)
Total Protein: 7.4 g/dL (ref 6.0–8.5)
eGFR: 104 mL/min/{1.73_m2} (ref >59)

## 2021-04-08 LAB — CBC WITH DIFFERENTIAL/PLATELET
Basophils Absolute: 0 10*3/uL (ref 0.0–0.2)
Basos: 1 %
EOS (ABSOLUTE): 0.1 10*3/uL (ref 0.0–0.4)
Eos: 1 %
Hematocrit: 33.7 % — ABNORMAL LOW (ref 34.0–46.6)
Hemoglobin: 10 g/dL — ABNORMAL LOW (ref 11.1–15.9)
Immature Grans (Abs): 0 10*3/uL (ref 0.0–0.1)
Immature Granulocytes: 0 %
Lymphocytes Absolute: 1.9 10*3/uL (ref 0.7–3.1)
Lymphs: 25 %
MCH: 20.2 pg — ABNORMAL LOW (ref 26.6–33.0)
MCHC: 29.7 g/dL — ABNORMAL LOW (ref 31.5–35.7)
MCV: 68 fL — ABNORMAL LOW (ref 79–97)
Monocytes Absolute: 0.6 10*3/uL (ref 0.1–0.9)
Monocytes: 8 %
Neutrophils Absolute: 4.8 10*3/uL (ref 1.4–7.0)
Neutrophils: 65 %
Platelets: 488 10*3/uL — ABNORMAL HIGH (ref 150–450)
RBC: 4.95 x10E6/uL (ref 3.77–5.28)
RDW: 20.8 % — ABNORMAL HIGH (ref 11.7–15.4)
WBC: 7.4 10*3/uL (ref 3.4–10.8)

## 2021-04-08 LAB — LIPID PANEL
Chol/HDL Ratio: 3.2 ratio (ref 0.0–4.4)
Cholesterol, Total: 173 mg/dL (ref 100–199)
HDL: 54 mg/dL (ref >39)
LDL Chol Calc (NIH): 95 mg/dL (ref 0–99)
Triglycerides: 134 mg/dL (ref 0–149)
VLDL Cholesterol Cal: 24 mg/dL (ref 5–40)

## 2021-04-08 LAB — THYROID PANEL WITH TSH
Free Thyroxine Index: 2.4 (ref 1.2–4.9)
T3 Uptake Ratio: 29 % (ref 24–39)
T4, Total: 8.2 ug/dL (ref 4.5–12.0)
TSH: 0.69 u[IU]/mL (ref 0.450–4.500)

## 2021-04-08 LAB — HEMOGLOBIN A1C
Est. average glucose Bld gHb Est-mCnc: 143 mg/dL
Hgb A1c MFr Bld: 6.6 % — ABNORMAL HIGH (ref 4.8–5.6)

## 2021-04-08 NOTE — Telephone Encounter (Signed)
-----   Message from [name], MD sent at 04/08/2021  5:45 AM EDT ----- ?Let pt know A1C is great , down to 6.6,  kidney stable , liver stable, blood counts stable she is less anemic, cholesterol is normal: follow healthy diet as we discussed, thyroid normal ?

## 2021-04-08 NOTE — Telephone Encounter (Signed)
Pt was called and no vm was left due to mailbox being full. Information has been sent to nurse pool.  

## 2021-04-14 ENCOUNTER — Encounter: Payer: Self-pay | Admitting: Critical Care Medicine

## 2021-04-16 ENCOUNTER — Ambulatory Visit: Payer: Commercial Managed Care - PPO | Admitting: Clinical

## 2021-05-06 ENCOUNTER — Telehealth: Payer: Self-pay | Admitting: Critical Care Medicine

## 2021-05-06 ENCOUNTER — Other Ambulatory Visit: Payer: Self-pay | Admitting: Critical Care Medicine

## 2021-05-06 NOTE — Telephone Encounter (Signed)
Let pt know mammogram was negative.  Repeat in one year, Stacy [name] will mail her a letter ?

## 2021-05-06 NOTE — Telephone Encounter (Signed)
Called pt and she is aware DOB was confirmed ? ?

## 2021-05-09 ENCOUNTER — Other Ambulatory Visit: Payer: Self-pay | Admitting: Critical Care Medicine

## 2021-05-09 NOTE — Telephone Encounter (Signed)
?  Notes to clinic:  Pharmacy requests as follows: Unsure if you want to give larger amount at a time, please assess. ?Pharmacy comment: REQUEST FOR 90 DAYS PRESCRIPTION.  ? ? ?  ? ?Requested Prescriptions  ?Pending Prescriptions Disp Refills  ? traZODone (DESYREL) 100 MG tablet [Pharmacy Med Name: TRAZODONE 100 MG TABLET] 180 tablet 1  ?  Sig: TAKE 1-2 TABLETS BY MOUTH AS NEEDED FOR SLEEP  ?  ? Psychiatry: Antidepressants - Serotonin Modulator Passed - 05/09/2021 12:32 PM  ?  ?  Passed - Completed PHQ-2 or PHQ-9 in the last 360 days  ?  ?  Passed - Valid encounter within last 6 months  ?  Recent Outpatient Visits   ? ?      ? 1 month ago Type 2 diabetes mellitus with other specified complication, without long-term current use of insulin ([place])  ? [place] [name], MD  ? 3 months ago Type 2 diabetes mellitus with other specified complication, without long-term current use of insulin ([place])  ? [place] [name], MD  ? ?  ?  ?Future Appointments   ? ?        ? In 1 month Joya Gaskins Burnett Harry, MD [place]  ? ?  ? ? ?  ?  ?  ? ? ?

## 2021-05-20 ENCOUNTER — Encounter: Payer: Self-pay | Admitting: Orthopaedic Surgery

## 2021-05-21 ENCOUNTER — Other Ambulatory Visit: Payer: Self-pay | Admitting: Orthopaedic Surgery

## 2021-05-21 MED ORDER — HYDROCODONE-ACETAMINOPHEN 5-325 MG PO TABS: 1.0000 | ORAL_TABLET | Freq: Two times a day (BID) | ORAL | 0 refills | Status: DC | PRN

## 2021-05-22 ENCOUNTER — Encounter: Payer: Self-pay | Admitting: Critical Care Medicine

## 2021-05-22 ENCOUNTER — Encounter (HOSPITAL_BASED_OUTPATIENT_CLINIC_OR_DEPARTMENT_OTHER): Payer: Self-pay | Admitting: Critical Care Medicine

## 2021-05-22 DIAGNOSIS — J209 Acute bronchitis, unspecified: Secondary | ICD-10-CM

## 2021-05-22 MED ORDER — COLCHICINE 0.6 MG PO TABS: 0.6000 mg | ORAL_TABLET | Freq: Every day | ORAL | 0 refills | Status: DC | PRN

## 2021-05-22 MED ORDER — AZITHROMYCIN 250 MG PO TABS: ORAL_TABLET | ORAL | 0 refills | Status: DC

## 2021-05-22 NOTE — Progress Notes (Signed)
Please see the MyChart message reply(ies) for my assessment and plan.  ?  ?This patient gave consent for this Medical Advice Message and is aware that it may result in a bill to [employer], as well as the possibility of receiving a bill for a co-payment or deductible. They are an established patient, but are not seeking medical advice exclusively about a problem treated during an in person or video visit in the last seven days. I did not recommend an in person or video visit within seven days of my reply.  ?  ?I spent a total of 8 minutes cumulative time within 7 days through [employer]. ? ?Asencion Noble, MD   ?

## 2021-05-23 ENCOUNTER — Ambulatory Visit: Payer: Commercial Managed Care - PPO | Admitting: Orthopaedic Surgery

## 2021-05-27 ENCOUNTER — Encounter: Payer: Self-pay | Admitting: Orthopaedic Surgery

## 2021-05-27 ENCOUNTER — Ambulatory Visit (INDEPENDENT_AMBULATORY_CARE_PROVIDER_SITE_OTHER): Payer: 59 | Admitting: Orthopaedic Surgery

## 2021-05-27 ENCOUNTER — Other Ambulatory Visit: Payer: Self-pay

## 2021-05-27 VITALS — BP 118/79 | Ht 63.0 in | Wt 138.0 lb

## 2021-05-27 DIAGNOSIS — M10062 Idiopathic gout, left knee: Secondary | ICD-10-CM

## 2021-05-27 DIAGNOSIS — M659 Synovitis and tenosynovitis, unspecified: Secondary | ICD-10-CM

## 2021-05-27 MED ORDER — HYDROCODONE-ACETAMINOPHEN 5-325 MG PO TABS: 1.0000 | ORAL_TABLET | Freq: Two times a day (BID) | ORAL | 0 refills | Status: DC | PRN

## 2021-05-27 NOTE — Progress Notes (Signed)
? ?Office Visit Note ?  ?Patient: Stacy [name]           ?Date of Birth: Aug 06, 1965           ?MRN: [id number] ?Visit Date: 05/27/2021 ?             ?Requested by: [name], MD ?[address] ?[address] ?[place],  [place] [id number] ?PCP: [name], MD ? ? ?Assessment & Plan: ?Visit Diagnoses:  ?1. Synovitis of left knee   ? ? ?Plan : arterial doppler LE,  norco 5/325 tablets #10 ordered no refill.  Injection performed left knee. ? ?Follow-Up Instructions: Return if symptoms worsen or fail to improve.  ? ?Orders:  ?Orders Placed This Encounter  ?Procedures  ? Large Joint Inj: L knee  ? ?Meds ordered this encounter  ?Medications  ? HYDROcodone-acetaminophen (NORCO/VICODIN) 5-325 MG tablet  ?  Sig: Take 1 tablet by mouth every 12 (twelve) hours as needed for moderate pain.  ?  Dispense:  10 tablet  ?  Refill:  0  ? ? ? ? Procedures: ?Large Joint Inj: L knee on 05/27/2021 5:21 PM ?Indications: joint swelling and pain ?Details: 22 G 1.5 in needle, anterolateral approach ? ?Arthrogram: No ? ?Medications: 0.5 mL lidocaine 1 %; 3 mL bupivacaine 0.5 %; 40 mg methylPREDNISolone acetate 40 MG/ML ?Outcome: tolerated well, no immediate complications ?Procedure, treatment alternatives, risks and benefits explained, specific risks discussed. Consent was given by the patient. Immediately prior to procedure a time out was called to verify the correct patient, procedure, equipment, support staff and site/side marked as required. Patient was prepped and draped in the usual sterile fashion.  ? ? ? ? ?Clinical Data: ?No additional findings. ? ? ?Subjective: ?Chief Complaint  ?Patient presents with  ? Right Knee - Pain  ? Left Knee - Pain  ? ? ?HPI 56 year old female returns on Xarelto for right leg DVT.  She has had left knee pain and swelling with synovitis and stiffness.  Previous history of gout associated with elevated creatinine.  Patient is requesting left knee aspiration injection.  Smoking history, hypertension  history of renal artery stenosis and elevated cholesterol.  She has not had arterial Dopplers lower extremity. ? ?Review of Systems all the systems noncontributory to HPI.  5 type 2 diabetes, history of gout.  Positive history of EtOH dependence with significant decrease intake. ? ? ?Objective: ?Vital Signs: BP 118/79   Ht '5\' 3"'$  (1.6 m)   Wt 138 lb (62.6 kg)   BMI 24.45 kg/m?  ? ?Physical Exam ?Constitutional:   ?   Appearance: She is well-developed.  ?HENT:  ?   Head: Normocephalic.  ?   Right Ear: External ear normal.  ?   Left Ear: External ear normal. There is no impacted cerumen.  ?Eyes:  ?   Pupils: Pupils are equal, round, and reactive to light.  ?Neck:  ?   Thyroid: No thyromegaly.  ?   Trachea: No tracheal deviation.  ?Cardiovascular:  ?   Rate and Rhythm: Normal rate.  ?Pulmonary:  ?   Effort: Pulmonary effort is normal.  ?Abdominal:  ?   Palpations: Abdomen is soft.  ?Musculoskeletal:  ?   Cervical back: No rigidity.  ?Skin: ?   General: Skin is warm and dry.  ?Neurological:  ?   Mental Status: She is alert and oriented to person, place, and time.  ?Psychiatric:     ?   Behavior: Behavior normal.  ? ? ?Ortho  Exam 2+ knee effusion.  Collateral ligaments are stable.  She has difficulty with flexion and extension.  No increased warmth.  Negative logroll hips. ? ?Specialty Comments:  ?No specialty comments available. ? ?Imaging: ?No results found. ? ? ?PMFS History: ?Patient Active Problem List  ? Diagnosis Date Noted  ? Chronic anticoagulation 04/07/2021  ? Acute deep vein thrombosis (DVT) of femoral vein of right lower extremity ([place]) 04/07/2021  ? 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 Encounter for screening mammogram for malignant neoplasm of breast 02/04/2021  ? Synovitis of right knee 12/28/2020  ? Synovitis of left knee   ? Anemia 12/19/2020  ? Epidermoid cyst of ear 11/23/2019  ? Renal artery stenosis ([place]) 08/02/2019  ? Adenoma of right adrenal gland 01/16/2019  ?  Coronary artery calcification seen on CAT scan 04/12/2018  ? Insomnia 01/01/2015  ? Fatty liver 01/01/2015  ? Thyroid nodule  managed Dr. Posey Pronto 02/27/2014  ? EtOH dependence ([place]) 11/22/2013  ? Tobacco use 10/04/2013  ? MDD (major depressive disorder) 08/23/2013  ? Colon polyp 08/23/2013  ? Migraines 08/23/2013  ? GERD (gastroesophageal reflux disease)   ? Depression with anxiety   ? Type 2 diabetes mellitus with other specified complication ([place])   ? Gout   ? Hypertension   ? Hyperlipemia   ? ?Past Medical History:  ?Diagnosis Date  ? Acute bronchitis 08/02/2019  ? Adrenal adenoma   ? as per MRI 03/09/14  ? AKI (acute kidney injury) ([place]) 12/20/2020  ? Allergy   ? Anxiety   ? Coronary artery calcification seen on CAT scan 04/12/2018  ? Depression   ? Diabetes mellitus   ? Diverticulosis   ? Elevated LFTs   ? elevated since 09/2013  ? Fatty liver   ? moderate- as per 03/06/17 MRI liver  ? GERD (gastroesophageal reflux disease)   ? Gout   ? H/O hiatal hernia   ? Headache(784.0)   ? History of depression   ? Hyperlipemia   ? Hyperplastic colon polyp   ? Hypertension   ? 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 Positive hepatitis C antibody test   ? RNA NEGATIVE  ? Renal artery stenosis ([place]) 08/02/2019  ? 06/2019: 1-59% stenosis on L   ? Thyroid nodule  managed Dr. Posey Pronto 02/27/2014  ? Tobacco abuse   ? Tubular adenoma of colon   ?  ?Family History  ?Problem Relation Age of Onset  ? Coronary artery disease Father 57  ?     CAD,CABG  ? Hypertension Mother   ? Stroke Mother   ? Diabetes Sister   ?     uncontrolled  ? Cancer Sister 77  ?     uterine cancer  ? Aneurysm Sister   ?     brain  ? Heart attack Maternal Grandmother   ? Blindness Paternal Grandmother   ? Breast cancer Neg Hx   ? Colon cancer Neg Hx   ? Esophageal cancer Neg Hx   ? Rectal cancer Neg Hx   ? Stomach cancer Neg Hx   ?  ?Past Surgical History:  ?Procedure Laterality Date  ? ANAL FISTULECTOMY  01/19/1985  ? BREAST EXCISIONAL BIOPSY Left   ? BREAST LUMPECTOMY Left 1988  ?  PAROTIDECTOMY  03/30/2011  ? Procedure: PAROTIDECTOMY;  Surgeon: Izora Gala, MD;  Location: [facility];  Service: ENT;  Laterality: Right;  ? [place]  ? ?Social History  ? ?Occupational History  ?  Employer: [employer]  ?Tobacco Use  ? Smoking status: Every Day  ?  Packs/day: 1.00  ?  Years: 28.00  ?  Pack years: 28.00  ?  Types: Cigarettes  ? Smokeless tobacco: Never  ?Vaping Use  ? Vaping Use: Never used  ?Substance and Sexual Activity  ? Alcohol use: Yes  ?  Alcohol/week: 14.0 standard drinks  ?  Types: 14 Standard drinks or equivalent per week  ? Drug use: No  ? Sexual activity: Not Currently  ?  Birth control/protection: Post-menopausal  ? ? ? ? ? ? ?

## 2021-06-02 ENCOUNTER — Other Ambulatory Visit: Payer: Self-pay | Admitting: Radiology

## 2021-06-02 DIAGNOSIS — M10062 Idiopathic gout, left knee: Secondary | ICD-10-CM

## 2021-06-02 DIAGNOSIS — G8929 Other chronic pain: Secondary | ICD-10-CM

## 2021-06-02 DIAGNOSIS — M659 Synovitis and tenosynovitis, unspecified: Secondary | ICD-10-CM

## 2021-06-02 MED ORDER — METHYLPREDNISOLONE ACETATE 40 MG/ML IJ SUSP
40.0000 mg | INTRAMUSCULAR | Status: AC | PRN
  Administered 2021-05-27: 40 mg via INTRA_ARTICULAR

## 2021-06-02 MED ORDER — LIDOCAINE HCL 1 % IJ SOLN
0.5000 mL | INTRAMUSCULAR | Status: AC | PRN
  Administered 2021-05-27: .5 mL

## 2021-06-02 MED ORDER — BUPIVACAINE HCL 0.5 % IJ SOLN
3.0000 mL | INTRAMUSCULAR | Status: AC | PRN
  Administered 2021-05-27: 3 mL via INTRA_ARTICULAR

## 2021-06-12 ENCOUNTER — Ambulatory Visit
Admission: RE | Admit: 2021-06-12 | Discharge: 2021-06-12 | Disposition: A | Discharge status: Home / Self Care | Payer: Commercial Managed Care - HMO | Source: Ambulatory Visit | Attending: Critical Care Medicine | Admitting: Critical Care Medicine

## 2021-06-12 ENCOUNTER — Ambulatory Visit: Payer: 59 | Attending: Critical Care Medicine | Admitting: Critical Care Medicine

## 2021-06-12 ENCOUNTER — Encounter: Payer: Self-pay | Admitting: Critical Care Medicine

## 2021-06-12 VITALS — BP 150/80 | HR 102 | Temp 98.9°F | Resp 16 | Ht 63.0 in | Wt 125.0 lb

## 2021-06-12 DIAGNOSIS — F331 Major depressive disorder, recurrent, moderate: Secondary | ICD-10-CM

## 2021-06-12 DIAGNOSIS — R634 Abnormal weight loss: Secondary | ICD-10-CM

## 2021-06-12 DIAGNOSIS — I82411 Acute embolism and thrombosis of right femoral vein: Secondary | ICD-10-CM | POA: Diagnosis not present

## 2021-06-12 DIAGNOSIS — Z7901 Long term (current) use of anticoagulants: Secondary | ICD-10-CM

## 2021-06-12 DIAGNOSIS — E7800 Pure hypercholesterolemia, unspecified: Secondary | ICD-10-CM

## 2021-06-12 DIAGNOSIS — E1169 Type 2 diabetes mellitus with other specified complication: Secondary | ICD-10-CM

## 2021-06-12 DIAGNOSIS — K219 Gastro-esophageal reflux disease without esophagitis: Secondary | ICD-10-CM

## 2021-06-12 DIAGNOSIS — K760 Fatty (change of) liver, not elsewhere classified: Secondary | ICD-10-CM | POA: Diagnosis not present

## 2021-06-12 DIAGNOSIS — Z720 Tobacco use: Secondary | ICD-10-CM

## 2021-06-12 DIAGNOSIS — F17200 Nicotine dependence, unspecified, uncomplicated: Secondary | ICD-10-CM

## 2021-06-12 DIAGNOSIS — I10 Essential (primary) hypertension: Secondary | ICD-10-CM

## 2021-06-12 DIAGNOSIS — I701 Atherosclerosis of renal artery: Secondary | ICD-10-CM

## 2021-06-12 DIAGNOSIS — R053 Chronic cough: Secondary | ICD-10-CM

## 2021-06-12 DIAGNOSIS — M0580 Other rheumatoid arthritis with rheumatoid factor of unspecified site: Secondary | ICD-10-CM

## 2021-06-12 DIAGNOSIS — M109 Gout, unspecified: Secondary | ICD-10-CM

## 2021-06-12 DIAGNOSIS — F1029 Alcohol dependence with unspecified alcohol-induced disorder: Secondary | ICD-10-CM

## 2021-06-12 MED ORDER — VALSARTAN 160 MG PO TABS: 160.0000 mg | ORAL_TABLET | Freq: Every day | ORAL | 3 refills | Status: DC

## 2021-06-12 MED ORDER — THIAMINE HCL 100 MG PO TABS: 100.0000 mg | ORAL_TABLET | Freq: Every day | ORAL | 2 refills | Status: DC

## 2021-06-12 MED ORDER — SPIRONOLACTONE 25 MG PO TABS: 25.0000 mg | ORAL_TABLET | Freq: Every day | ORAL | 3 refills | Status: DC

## 2021-06-12 MED ORDER — CLONIDINE HCL 0.2 MG PO TABS: 0.2000 mg | ORAL_TABLET | Freq: Two times a day (BID) | ORAL | 4 refills | Status: DC

## 2021-06-12 MED ORDER — FOLIC ACID 1 MG PO TABS: 1.0000 mg | ORAL_TABLET | Freq: Every day | ORAL | 2 refills | Status: DC

## 2021-06-12 MED ORDER — ALLOPURINOL 300 MG PO TABS: 300.0000 mg | ORAL_TABLET | Freq: Every day | ORAL | 4 refills | Status: DC

## 2021-06-12 MED ORDER — ESCITALOPRAM OXALATE 5 MG PO TABS: 5.0000 mg | ORAL_TABLET | Freq: Every day | ORAL | 0 refills | Status: DC

## 2021-06-12 NOTE — Assessment & Plan Note (Signed)
Evidence for improvement after starting Xarelto  Need to check hypercoagulable panel  Continue Xarelto for now

## 2021-06-12 NOTE — Assessment & Plan Note (Signed)
I engaged the patient on a serious discussion that she needs to stop drinking alcohol she does not have withdrawal symptoms when she quits drinking for several days.  She visited her father a few weeks ago for a long weekend and did not have any drinks and had no withdrawal  I gave the patient the [place] number for [facility] as she lives in [place] and recommended she go to one of the meetings

## 2021-06-12 NOTE — Assessment & Plan Note (Signed)
Discussed smoking cessation. Rx sent for nicotine patches and lozenges. Referral to [place], LCSW for behavioral therapy.      Current smoking consumption amount: 1 ppd  Dicsussion on advise to quit smoking and smoking impacts: CV lung impacts  Patient's willingness to quit:  Interested   Methods to quit smoking discussed:  Behavioral mod /meds  Medication management of smoking session drugs discussed:nicotine replacement: combo of nicotine patch and lozenge  Resources provided:  AVS   Setting quit date not established  Follow-up arranged 2 mo   Time spent counseling the patient:  5 min  

## 2021-06-12 NOTE — Progress Notes (Signed)
NEW Patient Office Visit  Subjective:  Patient ID: Stacy [name], female    DOB: 30-Aug-1965  Age: 56 y.o. MRN: [id number]  CC:  Chief Complaint  Patient presents with   Hypertension   Cough   Weight Loss    HPI 01/2021  Stacy [name] presents for follow up after hospitalization for an AKI/acute renal failure and electrolyte imbalance that resulted from severe vomiting. She was concurrently treated for an acute gout flare at that time. While in the hospital, she received nephrology and orthopedics consult. Her kidney function improved during her hospital stay, and she was cleared by nephrology for discharge. She received bilateral knee arthrocentesis and steroid injection of her knees to treat her gout. She is in need of an MRI for her knees, and has just gotten insurance approval for this procedure. She is attempting to schedule the MRI. The following includes the summary from discharge:   "56 y.o. female with medical history significant of HTN, HLD, GERD, T2DM, depression with anxiety, gout, nonobstructive CAD, left renal artery stenosis, tobacco abuse treated with abdominal pain, nausea and vomiting and not feeling well along with bilateral knee pain.  On presentation, she was slightly hypotensive, tachycardic.  WBC of 12.4; BUN of 77, creatinine of 9.5, lipase of 62.  Chest x-ray showed no acute findings.  Ultrasound of abdomen showed no acute findings to explain her abdominal pain.  She was started on IV fluids.  Nephrology was consulted.  During the hospitalization, her condition has gradually improved; creatinine is improving.  Orthopedics was consulted and patient underwent bilateral knee arthrocentesis and steroid injection; synovial fluid was consistent with gout flare.  She was also treated with oral prednisone.  Currently her renal function has much improved, nephrology has cleared the patient for discharge.  Currently she is hemodynamically stable, tolerating diet.  She will be  discharged home today on oral prednisone and allopurinol will be started.  Follow-up with PCP and orthopedics.  Follow-up with nephrology if needed."  Today, the patient states that she has improved since her hospital stay. She has had follow ups with multiple providers for her other medical issues. She followed up with cardiology on 01/24/21 and was started on spironolactone. She has a diagnosis of mild nonobstructive CAD that is being medically managed at this point in time. She previously took a statin for this issue, but it was stopped during hospitalization due to elevated liver transaminase. She has been referred to the lipid clinic for further management of this issue. She followed up with endocrinology for her diabetes earlier today. She is currently on Amaryl and Actos, and metformin was added back to her regimen today. She checks her blood sugars in the morning, and reports they have typically been between 105-110 mg/dL.   Stacy [name] gout symptoms remain troublesome. She is having significant pain in her right knee from the gout flare. She is taking Vicodin for this. She is unable to do any weight-bearing on this leg. She also has pain from the gout flare in her right foot. Her other concern today is that she is having significant trouble with sleeping. She has difficulty falling asleep and staying asleep, and this has been significantly impacting her mood. She has experienced racing thoughts when trying to fall asleep, and she has felt down and depressed.  The patient states that she is drinking less alcohol than previously. She now drinks two whiskeys 3-4 times per week, rather than every day. She still smokes, less than  1 PPD. She states she has been smoking a little more previously due to feelings of stress.  Notes reviewed below :  Cardiology OV 01/24/21.  Hosp DC from early 12/2020  Admit date: 12/19/2020 Discharge date: 12/22/2020   Admitted From: Home Disposition: Home   Recommendations  for Outpatient Follow-up:  Follow up with PCP in 1 week with repeat CBC/BMP Outpatient follow-up with nephrology if needed Outpatient follow-up with orthopedics/Dr. Lorin Mercy Follow up in ED if symptoms worsen or new appear     Home Health: No Equipment/Devices: None   Discharge Condition: Stable CODE STATUS: Full Diet recommendation: Heart healthy/carb modified   Brief/Interim Summary: 56 y.o. female with medical history significant of HTN, HLD, GERD, T2DM, depression with anxiety, gout, nonobstructive CAD, left renal artery stenosis, tobacco abuse treated with abdominal pain, nausea and vomiting and not feeling well along with bilateral knee pain.  On presentation, she was slightly hypotensive, tachycardic.  WBC of 12.4; BUN of 77, creatinine of 9.5, lipase of 62.  Chest x-ray showed no acute findings.  Ultrasound of abdomen showed no acute findings to explain her abdominal pain.  She was started on IV fluids.  Nephrology was consulted.  During the hospitalization, her condition has gradually improved; creatinine is improving.  Orthopedics was consulted and patient underwent bilateral knee arthrocentesis and steroid injection; synovial fluid was consistent with gout flare.  She was also treated with oral prednisone.  Currently her renal function has much improved, nephrology has cleared the patient for discharge.  Currently she is hemodynamically stable, tolerating diet.  She will be discharged home today on oral prednisone and allopurinol will be started.  Follow-up with PCP and orthopedics.  Follow-up with nephrology if needed.   Discharge Diagnoses:    Acute kidney injury/acute renal failure Anion gap metabolic acidosis -Possibly from ATN from dehydration and hypotension -Presented with creatinine of 9.5; treated with IV fluids.  Creatinine much improved to 1.57 today.  - Renal ultrasound negative for hydronephrosis  -nephrology following and has cleared the patient for discharge.   Outpatient follow-up with nephrology if needed. -Outpatient follow-up of BMP by PCP   Possible UTI -Present on admission.  Treated with 3 days of Rocephin.  No need for any further antibiotics on discharge.  Currently afebrile.   Bilateral knee pain and swelling/?  Acute gout flare -Patient states that she has had similar symptoms in the past requiring arthrocentesis which was consistent with acute gout.   - Avoid colchicine because of recent nausea and vomiting -Status post bilateral arthrocentesis by orthopedics on 12/20/2020: Synovial fluid analysis consistent with acute gout.  Bilateral knees were injected with steroids per orthopedics on 12/20/2020. -Continue oral prednisone on discharge 40 mg daily for 7 days.  Outpatient follow-up with orthopedics.     Nausea/vomiting -Questionable cause.  Ultrasound abdomen was negative for acute cause/gallbladder disease. -Much improved.  Currently tolerating diet.   Right upper quadrant pain/shoulder pain -Might be musculoskeletal as well.  Lipase only mildly elevated.  Troponin negative.  Continue current pain management.   Hypomagnesemia -Resolved   Mild hyperkalemia -Resolved   Leukocytosis -Possibly reactive.  Resolved   Renal artery stenosis -Renal duplex showed 1 to 59% stenosis of left renal 3.;  No evidence of right renal artery stenosis.   Normocytic anemia -Questionable cause.  Hemoglobin stable.  Outpatient follow-up   Fatty liver  Mildly elevated AST -Possibly from alcohol use.  Outpatient follow-up with GI.  Abstain from alcohol   Hypertension -Blood pressure on the lower  side.  Antihypertensives on hold   Hyperlipidemia -Statin on hold because of slightly elevated LFTs.  Outpatient follow-up with PCP.   GERD -Has completed Protonix treatment as an outpatient.  Follow-up with PCP.   Depression with anxiety -Stable.  Continue Remeron   Adrenal mass, right -Has outpatient follow-up   Tobacco use -Continue  nicotine patch.  Patient was counseled regarding cessation by admitting hospitalist  Cards 1/6    Essential hypertension: Medications were held during hospitalization 12/22 due to acute renal failure and hypotension. She was restarted on clonidine 0.2 mg 3 times a day.  Initial clinic blood pressure is elevated, however on my recheck it has actually decreased to 100/64.  She reports high blood pressure readings at home.  She would like to take as few pills as possible and would like something that may help with lower extremity edema.  We will restart spironolactone 25 mg once daily and reduce clonidine to 0.2 mg twice daily.  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  We will plan to follow-up on her blood pressure in 3 to 6 months depending on her availability.  She reports she currently is missing a lot of work due to multiple appointments and recent hospitalization.   Mild nonobstructive CAD: Coronary CT March 2021 revealed mild disease in the LAD and LCx.  Her coronary calcium score is 50.4, 94th percentile for age/sex, mild calcification of the aortic root.  Medical therapy recommended.  She denies chest pain, dyspnea or other symptoms concerning for angina.  No further ischemic evaluation warranted at this time.  Her statin was stopped during hospitalization 12/22 due to elevated transaminase.  This was also a problem in the past for her.  We will refer her to lipid clinic for management of lipids.  Aspirin was stopped during hospitalization due to anemia.  Would favor restarting aspirin 81 mg at next visit if blood counts have stabilized.   Hyperlipidemia LDL goal < 70: LDL 75 6/22.  She was previously on atorvastatin 40 mg.  As noted above statin was stopped during recent hospitalization due to elevated transaminase.  We will refer her to lipid clinic for management.   Bilateral leg edema: She has 1+ pitting edema in both  lower extremities.  She is having a significant amount of right knee swelling and pain due to gout.  She requests a blood pressure medicine that may help reduce some of the swelling.  We will restart spironolactone 25 mg as noted above she wants to take as few pills as possible and this is a potassium sparing diuretic.  Advised that she may not notice significant improvement in joint swelling with spironolactone.  Encouraged leg elevation (toes to nose) to help decrease leg swelling.   Difficulty sleeping: She states she is having difficulty sleeping because her mind is racing and she is having knee pain and requests a prescription for Ambien. States melatonin does not work for her. I encouraged her to use nonpharmacologic methods to help her sleep and suggested the Calm app or other methods that may help her calm her mind before going to sleep.  Encouraged leg elevation to help with pain and swelling.   Tobacco abuse: She continues to smoke less than 1/2 packs/day. Has reduced daytime smoking. Feels that her son who has recently moved back to [place] may help her quit because he is a big encouragement. Complete cessation advised.  04/07/21 Patient seen in return follow-up.  Patient has hypertension coronary artery calcification diabetes on oral therapy.  On arrival blood pressure 134/90 blood sugar 152.  Patient's had low blood sugars at times.  She is also had low blood pressures on clonidine therefore this drug was stopped.  She does maintain low-dose Aldactone.  She had increased pain and swelling in the right thigh and orthopedics appropriately ordered ultrasound of the lower extremities finding DVT in the right iliac vein and this was on February 24 of this year..  She has now been on the Xarelto.  She is completing the starter pack.  She has had no bleeding problems on Xarelto.  The leg pain is improved.  She still smoking less than 1/2 pack a day of cigarettes.  She was drinking 2 Crown Royal's  a day but has reduced her intake.  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  She lost her job because of all her health issues she was not able to attend regularly at work.  Patient did receive a Pap smear and it was normal.  She follows with endocrinology.  She is also been counseling with our licensed clinical social worker with her depression.  She continues to have insomnia and is requesting a higher dose of the trazodone.  Appetite is poor.  She would like some health screening labs obtained at this visit.  06/12/21 This patient is seen in return for follow-up is a 56 year old female with type 2 diabetes ethanol use tobacco use hypertension.  On arrival blood pressure is elevated 150/80 and she is having difficulty maintaining her diabetic program.  She is losing significant amounts of weight and has had a chronic cough.  We gave her a course of azithromycin a week ago and it persists.  She has some postnasal drainage and significant allergy symptoms.  She is drinking 3 [place] whiskeys daily.  She struggled to get off the alcohol.  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  She has been on Xarelto therapy now for 2 months.  Patient's had no bleeding with Xarelto  Patient continues to smoke a pack a day of cigarettes  Patient does not follow a healthy diet  Patient follows with endocrinology and they have her on oral Actos Amaryl and metformin.  She is often holding these medications.  She takes the medication she has severe nausea.  She is only eating 1 meal a day and cannot take in much food.  Blood pressure medicines now include the clonidine 0.2 mg twice daily and spironolactone 25 mg daily  She measures her blood pressure at home she gets around 140/90 usually.  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 Posey Pronto 02/27/2014   Tobacco abuse    Tubular adenoma of colon     Past Surgical History:  Procedure Laterality Date   ANAL FISTULECTOMY  01/19/1985   BREAST EXCISIONAL BIOPSY Left    BREAST LUMPECTOMY Left 1988   PAROTIDECTOMY  03/30/2011   Procedure: PAROTIDECTOMY;  Surgeon: Izora Gala, MD;  Location: [facility];  Service: ENT;  Laterality: Right;   WRIST GANGLION EXCISION Left 1988    Family History  Problem Relation Age of Onset   Coronary artery disease Father 1       CAD,CABG   Hypertension Mother    Stroke Mother    Diabetes Sister        uncontrolled   Cancer Sister 32       uterine cancer   Aneurysm Sister        brain   Heart attack Maternal Grandmother    Blindness Paternal Grandmother    Breast cancer Neg Hx    Colon cancer Neg Hx    Esophageal cancer Neg Hx    Rectal cancer Neg Hx    Stomach cancer Neg Hx     Social History   Socioeconomic History   Marital status: Widowed    Spouse name: Not on file   Number of children: 2   Years of education: Not on file   Highest education level: Not on file  Occupational History    Employer: [employer]  Tobacco Use   Smoking status: Every Day    Packs/day:  1.00    Years: 28.00    Pack years: 28.00    Types: Cigarettes   Smokeless tobacco: Never  Vaping Use   Vaping Use: Never used  Substance and Sexual Activity   Alcohol use: Yes    Alcohol/week: 14.0 standard drinks    Types: 14 Standard drinks or equivalent per week   Drug use: No   Sexual activity: Not Currently    Birth control/protection: Post-menopausal  Other Topics Concern   Not on file  Social History Narrative   Married. 2 children.    Ages 50, 1 y/o ( entered 09/2012)      Works with [employer]   Social Determinants of Health   Financial Resource Strain: Not on file  Food Insecurity: Not on file  Transportation Needs: Not on file  Physical Activity: Not on file  Stress: Not on file  Social Connections: Not on file  Intimate Partner Violence: Not on file    Outpatient Medications Prior to Visit  Medication Sig Dispense Refill   albuterol (VENTOLIN HFA) 108 (90 Base) MCG/ACT inhaler Inhale 2 puffs into the lungs every 4 (four) hours as needed for wheezing or shortness of breath. 6.7 g 1   colchicine 0.6 MG tablet Take 1 tablet (0.6 mg total) by mouth daily as needed. 20 tablet 0   EPINEPHrine 0.3 mg/0.3 mL IJ SOAJ injection Insert into muscle once with any facial or neck swelling  Instruct in use 1 Device 1   glimepiride (AMARYL) 1 MG tablet TAKE 1 TABLET BY MOUTH DAILY BEFORE SUPPER. 60 tablet 3   glucose blood (FREESTYLE PRECISION NEO TEST) test strip  Use as instructed 100 each 12   HYDROcodone-acetaminophen (NORCO/VICODIN) 5-325 MG tablet Take 1 tablet by mouth every 12 (twelve) hours as needed for moderate pain. 10 tablet 0   hydrOXYzine (VISTARIL) 25 MG capsule SMARTSIG:1 Capsule(s) By Mouth PRN     Lancets (FREESTYLE) lancets Use as instructed 100 each 12   metFORMIN (GLUCOPHAGE-XR) 500 MG 24 hr tablet Take 2 tablets (1,000 mg total) by mouth daily with supper. 120 tablet 3   Multiple Vitamins-Minerals (ONE-A-DAY WOMENS VITACRAVES) CHEW  Chew 1 each by mouth daily.     nicotine (NICOTINE STEP 1) 21 mg/24hr patch Place 1 patch (21 mg total) onto the skin daily. 28 patch 0   nicotine polacrilex (NICOTINE MINI) 2 MG lozenge Use three times a day as needed for smoking cessation 100 tablet 0   pioglitazone (ACTOS) 15 MG tablet Take 1 tablet (15 mg total) by mouth daily. 60 tablet 3   rivaroxaban (XARELTO) 20 MG TABS tablet Take 1 tablet (20 mg total) by mouth daily with supper. 60 tablet 3   traZODone (DESYREL) 100 MG tablet TAKE 1-2 TABLETS BY MOUTH AS NEEDED FOR SLEEP 180 tablet 0   allopurinol (ZYLOPRIM) 300 MG tablet Take 1 tablet (300 mg total) by mouth daily. 30 tablet 4   azithromycin (ZITHROMAX) 250 MG tablet Take two once then one daily until gone 6 tablet 0   cloNIDine (CATAPRES) 0.2 MG tablet Take 0.2 mg by mouth 2 (two) times daily.     escitalopram (LEXAPRO) 5 MG tablet Take 1 tablet (5 mg total) by mouth daily. 90 tablet 0   RIVAROXABAN (XARELTO) VTE STARTER PACK (15 & 20 MG) Follow package directions: Take one 81m tablet by mouth twice a day. On day 22, switch to one 282mtablet once a day. Take with food. 51 each 0   spironolactone (ALDACTONE) 25 MG tablet Take 1 tablet (25 mg total) by mouth daily. 90 tablet 3   No facility-administered medications prior to visit.    Allergies  Allergen Reactions   Almond Meal Anaphylaxis and Swelling   Other Anaphylaxis    Almond Butter   Aspirin Nausea Only    ROS Review of Systems  Constitutional:  Positive for appetite change and fatigue.  HENT: Negative.  Negative for ear pain, postnasal drip, rhinorrhea, sinus pressure, sore throat, trouble swallowing and voice change.   Eyes: Negative.   Respiratory: Negative.  Negative for apnea, cough, choking, chest tightness, shortness of breath, wheezing and stridor.   Cardiovascular: Negative.  Negative for chest pain, palpitations and leg swelling.  Gastrointestinal: Negative.  Negative for abdominal distention, abdominal  pain, nausea and vomiting.  Endocrine: Negative.   Genitourinary: Negative.  Negative for dysuria and hematuria.  Musculoskeletal:  Positive for arthralgias and joint swelling. Negative for myalgias.  Skin: Negative.  Negative for rash.  Allergic/Immunologic: Negative.  Negative for environmental allergies and food allergies.  Neurological:  Positive for weakness. Negative for dizziness, syncope and headaches.  Hematological: Negative.  Negative for adenopathy. Does not bruise/bleed easily.  Psychiatric/Behavioral:  Positive for dysphoric mood and sleep disturbance. Negative for agitation and suicidal ideas. The patient is not nervous/anxious.      Objective:    Physical Exam Vitals reviewed.  Constitutional:      Appearance: Normal appearance. She is well-developed and normal weight. She is not diaphoretic.  HENT:     Head: Normocephalic and atraumatic.     Nose: No nasal deformity, septal deviation, mucosal edema or rhinorrhea.  Right Sinus: No maxillary sinus tenderness or frontal sinus tenderness.     Left Sinus: No maxillary sinus tenderness or frontal sinus tenderness.     Mouth/Throat:     Mouth: Mucous membranes are moist.     Pharynx: Oropharynx is clear. No oropharyngeal exudate.  Eyes:     General: No scleral icterus.    Conjunctiva/sclera: Conjunctivae normal.     Pupils: Pupils are equal, round, and reactive to light.  Neck:     Thyroid: No thyromegaly.     Vascular: No carotid bruit or JVD.     Trachea: Trachea normal. No tracheal tenderness or tracheal deviation.  Cardiovascular:     Rate and Rhythm: Normal rate and regular rhythm.     Chest Wall: PMI is not displaced.     Pulses: Normal pulses. No decreased pulses.     Heart sounds: Normal heart sounds, S1 normal and S2 normal. Heart sounds not distant. No murmur heard. No systolic murmur is present.  No diastolic murmur is present.    No friction rub. No gallop. No S3 or S4 sounds.  Pulmonary:     Effort:  Pulmonary effort is normal. No tachypnea, accessory muscle usage or respiratory distress.     Breath sounds: No stridor. No decreased breath sounds, wheezing, rhonchi or rales.  Chest:     Chest wall: No tenderness.  Abdominal:     General: Abdomen is flat. Bowel sounds are normal. There is no distension.     Palpations: Abdomen is soft. Abdomen is not rigid. There is no mass.     Tenderness: There is no abdominal tenderness. There is no guarding or rebound.  Musculoskeletal:        General: No tenderness. Normal range of motion.     Cervical back: Normal range of motion and neck supple. No edema, erythema or rigidity. No muscular tenderness. Normal range of motion.     Right lower leg: No edema.     Left lower leg: No edema.     Right foot: Tenderness: tophi present on metatarsal of first toe.     Left foot: Normal.     Comments: No pain or edema or tenderness in the right leg  Lymphadenopathy:     Head:     Right side of head: No submental or submandibular adenopathy.     Left side of head: No submental or submandibular adenopathy.     Cervical: No cervical adenopathy.  Skin:    General: Skin is warm and dry.     Coloration: Skin is not pale.     Findings: No rash.     Nails: There is no clubbing.  Neurological:     Mental Status: She is alert and oriented to person, place, and time.     Sensory: No sensory deficit.  Psychiatric:        Mood and Affect: Mood normal.        Speech: Speech normal.        Behavior: Behavior normal.        Thought Content: Thought content normal.        Judgment: Judgment normal.    BP (!) 150/80 (BP Location: Left Arm, Patient Position: Sitting, Cuff Size: Normal)   Pulse (!) 102   Temp 98.9 F (37.2 C) (Oral)   Resp 16   Ht _0  (1.6 m)   Wt 125 lb (56.7 kg)   SpO2 100%   BMI 22.14 kg/m  Wt Readings from Last  3 Encounters:  06/12/21 125 lb (56.7 kg)  05/27/21 138 lb (62.6 kg)  02/25/21 138 lb (62.6 kg)     Health Maintenance Due   Topic Date Due   Zoster Vaccines- Shingrix (1 of 2) Never done   OPHTHALMOLOGY EXAM  06/10/2017   COVID-19 Vaccine (3 - Booster for Moderna series) 04/21/2019    There are no preventive care reminders to display for this patient. Vas US Renal Artery Bilateral 12/19/20  Right: Normal size right kidney. Normal right Resisitive Index. No         evidence of right renal artery stenosis. RRV flow present.  Left:  Normal size of left kidney. Normal left Resistive Index.         1-59% stenosis of the left renal artery. LRV flow present.  Mesenteric:  Normal Celiac artery findings.      Cor CT 03/29/19   IMPRESSION: LAD is a large vessel with mild (25-49%) calcified plaque in the proximal portion. The mid and distal portion with no plaque. D1 small vessel with no plaque. LCX is a non-dominant artery that gives rise to one large OM1 branch. There is minimal (1-24%) calcified plaque in the proximal portion of the LCX. The mid and distal portion with no plaque. There is no plaque in the OM1 branch. 1. Coronary calcium score of 50.4. This was 8 percentile for age and sex matched control. 2. Normal coronary origin with right dominance. 3. Mild Coronary artery disease. CADRADS 2. Recommend medical therapy. 4. Mild calcification of the aortic root.  Venous Dopplers February 24 reviewed showed DVT common femoral vein right leg and some clot up in the right iliac vein Lab Results  Component Value Date   TSH 0.690 04/07/2021   Lab Results  Component Value Date   WBC 7.4 04/07/2021   HGB 10.0 (L) 04/07/2021   HCT 33.7 (L) 04/07/2021   MCV 68 (L) 04/07/2021   PLT 488 (H) 04/07/2021   Lab Results  Component Value Date   NA 137 04/07/2021   K 3.7 04/07/2021   CO2 26 04/07/2021   GLUCOSE 121 (H) 04/07/2021   BUN 16 04/07/2021   CREATININE 0.64 04/07/2021   BILITOT 0.3 04/07/2021   ALKPHOS 123 (H) 04/07/2021   AST 44 (H) 04/07/2021   ALT 35 (H) 04/07/2021   PROT 7.4 04/07/2021    ALBUMIN 4.1 04/07/2021   CALCIUM 10.0 04/07/2021   ANIONGAP 6 12/22/2020   EGFR 104 04/07/2021   GFR 95.71 06/06/2019   Lab Results  Component Value Date   CHOL 173 04/07/2021   Lab Results  Component Value Date   HDL 54 04/07/2021   Lab Results  Component Value Date   LDLCALC 95 04/07/2021   Lab Results  Component Value Date   TRIG 134 04/07/2021   Lab Results  Component Value Date   CHOLHDL 3.2 04/07/2021   Lab Results  Component Value Date   HGBA1C 6.6 (H) 04/07/2021      Assessment & Plan:   Problem List Items Addressed This Visit       Cardiovascular and Mediastinum   Hypertension - Primary    Poorly controlled hypertension we will plan to add valsartan and 160 mg daily and continue clonidine and Aldactone and obtain labs  Have patient return in short-term with clinical pharmacy       Relevant Medications   cloNIDine (CATAPRES) 0.2 MG tablet   spironolactone (ALDACTONE) 25 MG tablet   valsartan (DIOVAN) 160 MG tablet  Other Relevant Orders   CBC with Differential/Platelet   Renal artery stenosis (HCC)    Recent assessment shows improvement in flow in both renal arteries we will monitor       Relevant Medications   cloNIDine (CATAPRES) 0.2 MG tablet   spironolactone (ALDACTONE) 25 MG tablet   valsartan (DIOVAN) 160 MG tablet   Acute deep vein thrombosis (DVT) of femoral vein of right lower extremity (HCC)    Evidence for improvement after starting Xarelto  Need to check hypercoagulable panel  Continue Xarelto for now       Relevant Medications   cloNIDine (CATAPRES) 0.2 MG tablet   spironolactone (ALDACTONE) 25 MG tablet   valsartan (DIOVAN) 160 MG tablet   Other Relevant Orders   CBC with Differential/Platelet   Antithrombin III   Protein C activity   Protein C, total   Protein S activity   Protein S, total   Lupus anticoagulant panel   Factor 5 leiden   Prothrombin gene mutation   Cardiolipin antibodies, IgG, IgM, IgA      Digestive   GERD (gastroesophageal reflux disease)   Relevant Orders   CBC with Differential/Platelet   Fatty liver   Relevant Orders   Comprehensive metabolic panel     Endocrine   Type 2 diabetes mellitus with other specified complication ([place])    Followed by endocrinology and is on Actos along with Amaryl and metformin will engage with endocrinology to see if additional changes can be made since she is not taking these medicines regularly because of nausea and weight loss also she is not able to eat regularly  The following Lifestyle Medicine recommendations according to [place] of Lifestyle Medicine [facility]) were discussed and offered to patient who agrees to start the journey:  A. Whole Foods, Plant-based plate comprising of fruits and vegetables, plant-based proteins, whole-grain carbohydrates was discussed in detail with the patient.   A list for source of those nutrients were also provided to the patient.  Patient will use only water or unsweetened tea for hydration. B.  The need to stay away from risky substances including alcohol, smoking; obtaining 7 to 9 hours of restorative sleep, at least 150 minutes of moderate intensity exercise weekly, the importance of healthy social connections,  and stress reduction techniques were discussed.       Relevant Medications   valsartan (DIOVAN) 160 MG tablet   Other Relevant Orders   Comprehensive metabolic panel   Microalbumin / creatinine urine ratio     Musculoskeletal and Integument   Polyarthritis with positive rheumatoid factor (HCC)    We have not been able to discern a rheumatologic diagnosis up to now the working diagnosis is degenerative arthritis in both knees and gout related to renal insufficiency         Other   Hyperlipemia    Monitor lipids       Relevant Medications   cloNIDine (CATAPRES) 0.2 MG tablet   spironolactone (ALDACTONE) 25 MG tablet   valsartan (DIOVAN) 160 MG tablet   Gout    Continue  allopurinol as prescribed       MDD (major depressive disorder)    Counseling given       Relevant Medications   escitalopram (LEXAPRO) 5 MG tablet   Tobacco use    Discussed smoking cessation. Rx sent for nicotine patches and lozenges. Referral to [place], LCSW for behavioral therapy.      Current smoking consumption amount: 1 ppd  Dicsussion on advise to  quit smoking and smoking impacts: CV lung impacts  Patient's willingness to quit:  Interested   Methods to quit smoking discussed:  Behavioral mod /meds  Medication management of smoking session drugs discussed:nicotine replacement: combo of nicotine patch and lozenge  Resources provided:  AVS   Setting quit date not established  Follow-up arranged 2 mo   Time spent counseling the patient:  5 min       EtOH dependence ([place])    I engaged the patient on a serious discussion that she needs to stop drinking alcohol she does not have withdrawal symptoms when she quits drinking for several days.  She visited her father a few weeks ago for a long weekend and did not have any drinks and had no withdrawal  I gave the patient the [place] number for [facility] as she lives in [place] and recommended she go to one of the meetings       Chronic anticoagulation    Plan to monitor CBC she is not actively bleeding       Other Visit Diagnoses     Weight loss       Relevant Orders   Comprehensive metabolic panel   CBC with Differential/Platelet   Lipase   Chronic cough       Relevant Orders   DG Chest 2 View   Smoker       Relevant Orders   DG Chest 2 View      Meds ordered this encounter  Medications   allopurinol (ZYLOPRIM) 300 MG tablet    Sig: Take 1 tablet (300 mg total) by mouth daily.    Dispense:  30 tablet    Refill:  4   cloNIDine (CATAPRES) 0.2 MG tablet    Sig: Take 1 tablet (0.2 mg total) by mouth 2 (two) times daily.    Dispense:  60 tablet    Refill:  4   escitalopram (LEXAPRO) 5 MG  tablet    Sig: Take 1 tablet (5 mg total) by mouth daily.    Dispense:  90 tablet    Refill:  0   spironolactone (ALDACTONE) 25 MG tablet    Sig: Take 1 tablet (25 mg total) by mouth daily.    Dispense:  90 tablet    Refill:  3   valsartan (DIOVAN) 160 MG tablet    Sig: Take 1 tablet (160 mg total) by mouth daily.    Dispense:  90 tablet    Refill:  3   thiamine 100 MG tablet    Sig: Take 1 tablet (100 mg total) by mouth daily.    Dispense:  60 tablet    Refill:  2   folic acid (FOLVITE) 1 MG tablet    Sig: Take 1 tablet (1 mg total) by mouth daily.    Dispense:  60 tablet    Refill:  2   29mnutes spent performing history and physical counseling patient on lifestyle management assessing multiple medications and authorizing refills Follow-up: Return in about 2 months (around 08/12/2021) for See LLurena Joinerin 3 weeks Dr. WJoya Gaskins2 months.    PAsencion Noble MD

## 2021-06-12 NOTE — Progress Notes (Signed)
Concerns with allergies-  Request cxr- cough No appetite

## 2021-06-12 NOTE — Assessment & Plan Note (Addendum)
Followed by endocrinology and is on Actos along with Amaryl and metformin will engage with endocrinology to see if additional changes can be made since she is not taking these medicines regularly because of nausea and weight loss also she is not able to eat regularly  The following Lifestyle Medicine recommendations according to [place] of Lifestyle Medicine [facility]) were discussed and offered to patient who agrees to start the journey:  A. Whole Foods, Plant-based plate comprising of fruits and vegetables, plant-based proteins, whole-grain carbohydrates was discussed in detail with the patient.   A list for source of those nutrients were also provided to the patient.  Patient will use only water or unsweetened tea for hydration. B.  The need to stay away from risky substances including alcohol, smoking; obtaining 7 to 9 hours of restorative sleep, at least 150 minutes of moderate intensity exercise weekly, the importance of healthy social connections,  and stress reduction techniques were discussed.

## 2021-06-12 NOTE — Assessment & Plan Note (Signed)
We have not been able to discern a rheumatologic diagnosis up to now the working diagnosis is degenerative arthritis in both knees and gout related to renal insufficiency

## 2021-06-12 NOTE — Assessment & Plan Note (Signed)
Continue allopurinol as prescribed

## 2021-06-12 NOTE — Patient Instructions (Signed)
[place] is the website for narcotics anonymous  [email or web address] (website) or [phone] is the information for alcoholics anonymous  Both are free and immediately available for help with alcohol and drug use  Complete set of lab screenings will be obtained including hypercoagulable panel  Focus on alcohol cessation and joining AA meetings call number above  Chest x-ray will be obtained  Stop smoking using nicotine patch and lozenges we discussed  Monitor blood pressure daily  I will discuss with Dr. [name] your diabetic treatment program  Return to see [facility] our clinical pharmacist 3 weeks Dr. Joya Gaskins 2 months

## 2021-06-12 NOTE — Assessment & Plan Note (Signed)
Monitor lipids

## 2021-06-12 NOTE — Assessment & Plan Note (Signed)
Recent assessment shows improvement in flow in both renal arteries we will monitor

## 2021-06-12 NOTE — Assessment & Plan Note (Signed)
Counseling given. 

## 2021-06-12 NOTE — Assessment & Plan Note (Signed)
Plan to monitor CBC she is not actively bleeding

## 2021-06-12 NOTE — Assessment & Plan Note (Signed)
Poorly controlled hypertension we will plan to add valsartan and 160 mg daily and continue clonidine and Aldactone and obtain labs  Have patient return in short-term with clinical pharmacy

## 2021-06-13 ENCOUNTER — Telehealth: Payer: Self-pay

## 2021-06-13 LAB — MICROALBUMIN / CREATININE URINE RATIO
Creatinine, Urine: 208.8 mg/dL
Microalb/Creat Ratio: 128 mg/g creat — ABNORMAL HIGH (ref 0–29)
Microalbumin, Urine: 268.1 ug/mL

## 2021-06-13 LAB — CBC WITH DIFFERENTIAL/PLATELET
Basophils Absolute: 0.1 10*3/uL (ref 0.0–0.2)
Basos: 1 %
EOS (ABSOLUTE): 0.2 10*3/uL (ref 0.0–0.4)
Eos: 2 %
Hematocrit: 34 % (ref 34.0–46.6)
Hemoglobin: 10.2 g/dL — ABNORMAL LOW (ref 11.1–15.9)
Immature Grans (Abs): 0 10*3/uL (ref 0.0–0.1)
Immature Granulocytes: 0 %
Lymphocytes Absolute: 2.3 10*3/uL (ref 0.7–3.1)
Lymphs: 33 %
MCH: 20.3 pg — ABNORMAL LOW (ref 26.6–33.0)
MCHC: 30 g/dL — ABNORMAL LOW (ref 31.5–35.7)
MCV: 68 fL — ABNORMAL LOW (ref 79–97)
Monocytes Absolute: 0.6 10*3/uL (ref 0.1–0.9)
Monocytes: 8 %
Neutrophils Absolute: 4.1 10*3/uL (ref 1.4–7.0)
Neutrophils: 56 %
Platelets: 610 10*3/uL — ABNORMAL HIGH (ref 150–450)
RBC: 5.02 x10E6/uL (ref 3.77–5.28)
RDW: 19.6 % — ABNORMAL HIGH (ref 11.7–15.4)
WBC: 7.2 10*3/uL (ref 3.4–10.8)

## 2021-06-13 LAB — COMPREHENSIVE METABOLIC PANEL
ALT: 21 IU/L (ref 0–32)
AST: 18 IU/L (ref 0–40)
Albumin/Globulin Ratio: 1.2 (ref 1.2–2.2)
Albumin: 4.3 g/dL (ref 3.8–4.9)
Alkaline Phosphatase: 109 IU/L (ref 44–121)
BUN/Creatinine Ratio: 14 (ref 9–23)
BUN: 8 mg/dL (ref 6–24)
Bilirubin Total: 0.3 mg/dL (ref 0.0–1.2)
CO2: 24 mmol/L (ref 20–29)
Calcium: 10.2 mg/dL (ref 8.7–10.2)
Chloride: 98 mmol/L (ref 96–106)
Creatinine, Ser: 0.57 mg/dL (ref 0.57–1.00)
Globulin, Total: 3.5 g/dL (ref 1.5–4.5)
Glucose: 127 mg/dL — ABNORMAL HIGH (ref 70–99)
Potassium: 3.8 mmol/L (ref 3.5–5.2)
Sodium: 142 mmol/L (ref 134–144)
Total Protein: 7.8 g/dL (ref 6.0–8.5)
eGFR: 107 mL/min/{1.73_m2} (ref >59)

## 2021-06-13 LAB — LIPASE: Lipase: 38 U/L (ref 14–72)

## 2021-06-13 NOTE — Telephone Encounter (Signed)
-----   Message from [name], MD sent at 06/13/2021  5:54 AM EDT ----- Let pt know all labs normal

## 2021-06-13 NOTE — Telephone Encounter (Signed)
Pt was called and vm was left, Information has been sent to nurse pool.   

## 2021-06-14 ENCOUNTER — Encounter: Payer: Self-pay | Admitting: Critical Care Medicine

## 2021-06-17 ENCOUNTER — Other Ambulatory Visit: Payer: Self-pay

## 2021-06-19 ENCOUNTER — Telehealth: Payer: Self-pay

## 2021-06-19 ENCOUNTER — Telehealth: Payer: Self-pay | Admitting: Critical Care Medicine

## 2021-06-19 DIAGNOSIS — E1169 Type 2 diabetes mellitus with other specified complication: Secondary | ICD-10-CM

## 2021-06-19 NOTE — Progress Notes (Signed)
Pt is aware.  

## 2021-06-19 NOTE — Telephone Encounter (Signed)
Pt was called and is aware of results, DOB was confirmed.  ?

## 2021-06-19 NOTE — Telephone Encounter (Signed)
Pt wants second opinion on diabetes  I will refer her to Dagmar Hait of endocrinology [facility]

## 2021-06-19 NOTE — Telephone Encounter (Signed)
Called pt and she is aware  ?

## 2021-06-27 ENCOUNTER — Ambulatory Visit (HOSPITAL_COMMUNITY): Payer: Commercial Managed Care - HMO

## 2021-07-02 ENCOUNTER — Ambulatory Visit (HOSPITAL_COMMUNITY): Payer: Commercial Managed Care - HMO

## 2021-07-08 ENCOUNTER — Encounter (HOSPITAL_COMMUNITY): Payer: Self-pay

## 2021-07-08 ENCOUNTER — Ambulatory Visit (HOSPITAL_COMMUNITY)
Admission: RE | Admit: 2021-07-08 | Discharge: 2021-07-08 | Disposition: A | Discharge status: Home / Self Care | Payer: Commercial Managed Care - HMO | Source: Ambulatory Visit | Attending: Orthopaedic Surgery | Admitting: Orthopaedic Surgery

## 2021-07-08 DIAGNOSIS — G8929 Other chronic pain: Secondary | ICD-10-CM | POA: Diagnosis not present

## 2021-07-08 DIAGNOSIS — M25562 Pain in left knee: Secondary | ICD-10-CM | POA: Diagnosis present

## 2021-07-08 DIAGNOSIS — M659 Synovitis and tenosynovitis, unspecified: Secondary | ICD-10-CM | POA: Diagnosis not present

## 2021-07-08 DIAGNOSIS — M10062 Idiopathic gout, left knee: Secondary | ICD-10-CM | POA: Insufficient documentation

## 2021-07-08 DIAGNOSIS — M25561 Pain in right knee: Secondary | ICD-10-CM | POA: Insufficient documentation

## 2021-07-08 NOTE — Progress Notes (Signed)
ABI completed. Refer to "CV Proc" under chart review to view preliminary results.  07/08/2021 8:37 AM Kelby Aline., MHA, RVT, RDCS, RDMS

## 2021-07-14 ENCOUNTER — Other Ambulatory Visit: Payer: Self-pay

## 2021-07-15 ENCOUNTER — Other Ambulatory Visit: Payer: Self-pay

## 2021-07-18 ENCOUNTER — Encounter: Payer: Self-pay | Admitting: Critical Care Medicine

## 2021-07-18 ENCOUNTER — Encounter: Payer: Self-pay | Admitting: Orthopaedic Surgery

## 2021-07-18 ENCOUNTER — Ambulatory Visit: Payer: Commercial Managed Care - HMO | Attending: Critical Care Medicine | Admitting: Pharmacist

## 2021-07-18 ENCOUNTER — Encounter: Payer: Self-pay | Admitting: Pharmacist

## 2021-07-18 VITALS — BP 148/88

## 2021-07-18 DIAGNOSIS — I10 Essential (primary) hypertension: Secondary | ICD-10-CM | POA: Diagnosis not present

## 2021-07-18 NOTE — Progress Notes (Signed)
S:     No chief complaint on file.  Stacy [name] is a 56 y.o. female who presents for hypertension evaluation, education, and management.  PMH is significant for HTN, CAD, unilateral (L) renal artery stenosis, migraines, VTE on anticoagulation with Xarelto, GERD, T2DM, bilateral knee synovitis, HLD, GOUT, MDD, tobacco use and alcohol use.   Patient was referred and last seen by Primary Care Provider, Dr. Joya Gaskins, on 06/12/2021.   At last visit, Dr. Joya Gaskins added back valsartan to her regimen.   Recent BP management hx:  Last seen by Dr. Oval Linsey in 2022. Was controlled on amlodipine, HCTZ, valsartan, and spiro. Was hospitalized 12/1 - 12/22/2020 with AKI and her antihypertensives were held. She was restarted on clonidine 0.2 mg TID at that time. NP at [facility] restarted spiro and decreased clonidine on 01/24/21. Of note, pt found to have 1+ pitting edema at that visit. Clonidine was reduced to 0.1 mg BID d/t hypotension by Dr. [name] on 02/04/21. We have seen her a couple of times since with BP being at or close to goal on 1/17 and 04/07/2021. Most recently, pt was seen 06/12/2021 and BP was above goal. Valsartan was restarted.   Today, patient arrives in good spirits and presents without assistance. Denies dizziness, headache, blurred vision, swelling.   Patient reports hypertension is longstanding.  Family/Social history:  -Fhx: CAD, HTN, stroke, DM -Tobacco: current 1 PPD smoker  -Alcohol: history of alcohol use disorder   Medication adherence reported. Patient has taken spiro and valsartan today. Has not been taking clonidine.  Current antihypertensives include: clonidine 0.1 mg BID (not taking), spironolactone 25 mg daily, valsartan 160 mg daily  Reported home BP readings:  -None  Patient reported dietary habits:  -Endorses compliance with salt restriction -Drinks Pepsi daily   Patient-reported exercise habits:  -No formal regimen  O:  Vitals:   07/18/21 1003  BP: (!)  148/88   Last 3 Office BP readings: BP Readings from Last 3 Encounters:  07/18/21 (!) 148/88  06/12/21 (!) 150/80  05/27/21 118/79    BMET    Component Value Date/Time   NA 142 06/12/2021 1043   K 3.8 06/12/2021 1043   CL 98 06/12/2021 1043   CO2 24 06/12/2021 1043   GLUCOSE 127 (H) 06/12/2021 1043   GLUCOSE 182 (H) 12/27/2020 1530   BUN 8 06/12/2021 1043   CREATININE 0.57 06/12/2021 1043   CREATININE 0.93 12/27/2020 1530   CALCIUM 10.2 06/12/2021 1043   GFRNONAA 38 (L) 12/22/2020 0342   GFRNONAA 67 02/01/2014 1401   GFRAA 79 12/04/2019 1020   GFRAA 77 02/01/2014 1401    Renal function: CrCl cannot be calculated (Patient's most recent lab result is older than the maximum 21 days allowed.).  Clinical ASCVD: No  The 10-year ASCVD risk score (Arnett DK, et al., 2019) is: 31.4%   Values used to calculate the score:     Age: 40 years     Sex: Female     Is Non-Hispanic African American: Yes     Diabetic: Yes     Tobacco smoker: Yes     Systolic Blood Pressure: 863 mmHg     Is BP treated: Yes     HDL Cholesterol: 54 mg/dL     Total Cholesterol: 173 mg/dL   A/P: Hypertension longstanding currently above goal on current medications. BP goal < 130/80 mmHg. Medication adherence appears appropriate with spiro and valsartan but she is not taking clonidine. RAAS agents can induce or worsen  renal insufficiency in patients with bilateral or several unilateral renal artery stenosis. This pt has a hx of moderate L renal artery stenosis but R renal artery looked good from doppler study in 2021. She has an elevated urine microalbumin but eGFR looks good. Electrolyte status is nl and stable. Will get labs today and if renal function is good now ~1 month since starting valsartan, will optimize to the 320 mg dose. Will not add back thiazide at this time d/t gout history. Will not add amlodipine at this time d/t LE edema but we can consider low dose in the future if we need continued  improvement if she can tolerate. -Continued current regimen for now. Will increase valsartan depending on lab results.   -CMP14+eGFR -Counseled on lifestyle modifications for blood pressure control including reduced dietary sodium, increased exercise, adequate sleep. -Encouraged patient to check BP at home and bring log of readings to next visit. Counseled on proper use of home BP cuff.    Results reviewed and written information provided.    Written patient instructions provided. Patient verbalized understanding of treatment plan.  Total time in face to face counseling 30 minutes.    Follow-up:  PCP clinic visit in 1 month.    Benard Halsted, PharmD, [place], [place] [phone]

## 2021-07-18 NOTE — Telephone Encounter (Signed)
James please advise on message below.  Thank you.

## 2021-07-19 LAB — CMP14+EGFR
ALT: 17 IU/L (ref 0–32)
AST: 21 IU/L (ref 0–40)
Albumin/Globulin Ratio: 1.2 (ref 1.2–2.2)
Albumin: 4 g/dL (ref 3.8–4.9)
Alkaline Phosphatase: 89 IU/L (ref 44–121)
BUN/Creatinine Ratio: 22 (ref 9–23)
BUN: 14 mg/dL (ref 6–24)
Bilirubin Total: 0.3 mg/dL (ref 0.0–1.2)
CO2: 23 mmol/L (ref 20–29)
Calcium: 9.7 mg/dL (ref 8.7–10.2)
Chloride: 102 mmol/L (ref 96–106)
Creatinine, Ser: 0.63 mg/dL (ref 0.57–1.00)
Globulin, Total: 3.4 g/dL (ref 1.5–4.5)
Glucose: 89 mg/dL (ref 70–99)
Potassium: 3.8 mmol/L (ref 3.5–5.2)
Sodium: 146 mmol/L — ABNORMAL HIGH (ref 134–144)
Total Protein: 7.4 g/dL (ref 6.0–8.5)
eGFR: 104 mL/min/{1.73_m2} (ref >59)

## 2021-07-21 ENCOUNTER — Other Ambulatory Visit: Payer: Self-pay

## 2021-07-21 ENCOUNTER — Telehealth: Payer: Self-pay | Admitting: Pharmacist

## 2021-07-21 MED ORDER — RIVAROXABAN 20 MG PO TABS: 20.0000 mg | ORAL_TABLET | Freq: Every day | ORAL | 1 refills | Status: DC

## 2021-07-21 NOTE — Telephone Encounter (Signed)
Stacy [name],   This patient contacted [place] requesting a 90-day supply on her Xarelto. I resent the rx today but I think her insurance is requiring a PA. Are we able to submit one for coverage of a 90 day supply?

## 2021-07-21 NOTE — Telephone Encounter (Signed)
Understood. Message sent to patient for her to contact her insurance company and inquire concerning a preferred pharmacy.

## 2021-07-23 ENCOUNTER — Other Ambulatory Visit: Payer: Self-pay | Admitting: Pharmacist

## 2021-07-23 MED ORDER — VALSARTAN 320 MG PO TABS: 320.0000 mg | ORAL_TABLET | Freq: Every day | ORAL | 1 refills | Status: DC

## 2021-08-01 NOTE — Telephone Encounter (Signed)
Please advise on message below. Thank you!

## 2021-08-04 NOTE — Telephone Encounter (Signed)
Called and left a VM advising patient of message below, per Dr. Lorin Mercy.

## 2021-08-13 ENCOUNTER — Other Ambulatory Visit: Payer: Self-pay | Admitting: Critical Care Medicine

## 2021-08-14 ENCOUNTER — Ambulatory Visit: Payer: Commercial Managed Care - HMO | Attending: Critical Care Medicine | Admitting: Critical Care Medicine

## 2021-08-14 ENCOUNTER — Ambulatory Visit: Payer: Self-pay | Admitting: *Deleted

## 2021-08-14 ENCOUNTER — Ambulatory Visit (HOSPITAL_COMMUNITY)
Admission: RE | Admit: 2021-08-14 | Discharge: 2021-08-14 | Disposition: A | Discharge status: Home / Self Care | Payer: Commercial Managed Care - HMO | Source: Ambulatory Visit | Attending: Critical Care Medicine | Admitting: Critical Care Medicine

## 2021-08-14 ENCOUNTER — Encounter: Payer: Self-pay | Admitting: Critical Care Medicine

## 2021-08-14 VITALS — BP 167/103 | HR 102 | Temp 98.4°F | Ht 63.0 in | Wt 127.4 lb

## 2021-08-14 DIAGNOSIS — E1169 Type 2 diabetes mellitus with other specified complication: Secondary | ICD-10-CM

## 2021-08-14 DIAGNOSIS — F331 Major depressive disorder, recurrent, moderate: Secondary | ICD-10-CM

## 2021-08-14 DIAGNOSIS — I82411 Acute embolism and thrombosis of right femoral vein: Secondary | ICD-10-CM

## 2021-08-14 DIAGNOSIS — F1029 Alcohol dependence with unspecified alcohol-induced disorder: Secondary | ICD-10-CM

## 2021-08-14 DIAGNOSIS — Z720 Tobacco use: Secondary | ICD-10-CM | POA: Diagnosis not present

## 2021-08-14 DIAGNOSIS — E7800 Pure hypercholesterolemia, unspecified: Secondary | ICD-10-CM

## 2021-08-14 DIAGNOSIS — Z7901 Long term (current) use of anticoagulants: Secondary | ICD-10-CM

## 2021-08-14 DIAGNOSIS — I10 Essential (primary) hypertension: Secondary | ICD-10-CM

## 2021-08-14 MED ORDER — GLIMEPIRIDE 1 MG PO TABS: ORAL_TABLET | ORAL | 3 refills | Status: DC

## 2021-08-14 MED ORDER — ESCITALOPRAM OXALATE 10 MG PO TABS: 10.0000 mg | ORAL_TABLET | Freq: Every day | ORAL | 2 refills | Status: DC

## 2021-08-14 MED ORDER — AMLODIPINE BESYLATE 10 MG PO TABS: 10.0000 mg | ORAL_TABLET | Freq: Every day | ORAL | 2 refills | Status: DC

## 2021-08-14 MED ORDER — METFORMIN HCL ER 500 MG PO TB24: 1000.0000 mg | ORAL_TABLET | Freq: Every day | ORAL | 3 refills | Status: DC

## 2021-08-14 NOTE — Assessment & Plan Note (Signed)
Plan increase Lexapro 10 mg daily

## 2021-08-14 NOTE — Telephone Encounter (Signed)
Reason for Disposition  Health Information question, no triage required and triager able to answer question    U/S result lower extremities  Answer Assessment - Initial Assessment Questions 1. REASON FOR CALL or QUESTION: "What is your reason for calling today?" or "How can I best help you?" or "What question do you have that I can help answer?"     Greg with radiology with U/S results of lower extremity.  Left leg negative for DVT.  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  This information sent to Dr. Asencion Noble at [facility]  Protocols used: Information Only Call - No Triage-A-AH

## 2021-08-14 NOTE — Assessment & Plan Note (Signed)
-  Continue statins ?

## 2021-08-14 NOTE — Progress Notes (Signed)
NEW Patient Office Visit  Subjective:  Patient ID: Stacy [name], female    DOB: 1966/01/05  Age: 56 y.o. MRN: [id number]  CC:  Chief Complaint  Patient presents with   Hypertension    HPI 01/2021  Stacy [name] presents for follow up after hospitalization for an AKI/acute renal failure and electrolyte imbalance that resulted from severe vomiting. She was concurrently treated for an acute gout flare at that time. While in the hospital, she received nephrology and orthopedics consult. Her kidney function improved during her hospital stay, and she was cleared by nephrology for discharge. She received bilateral knee arthrocentesis and steroid injection of her knees to treat her gout. She is in need of an MRI for her knees, and has just gotten insurance approval for this procedure. She is attempting to schedule the MRI. The following includes the summary from discharge:   "56 y.o. female with medical history significant of HTN, HLD, GERD, T2DM, depression with anxiety, gout, nonobstructive CAD, left renal artery stenosis, tobacco abuse treated with abdominal pain, nausea and vomiting and not feeling well along with bilateral knee pain.  On presentation, she was slightly hypotensive, tachycardic.  WBC of 12.4; BUN of 77, creatinine of 9.5, lipase of 62.  Chest x-ray showed no acute findings.  Ultrasound of abdomen showed no acute findings to explain her abdominal pain.  She was started on IV fluids.  Nephrology was consulted.  During the hospitalization, her condition has gradually improved; creatinine is improving.  Orthopedics was consulted and patient underwent bilateral knee arthrocentesis and steroid injection; synovial fluid was consistent with gout flare.  She was also treated with oral prednisone.  Currently her renal function has much improved, nephrology has cleared the patient for discharge.  Currently she is hemodynamically stable, tolerating diet.  She will be discharged home today on  oral prednisone and allopurinol will be started.  Follow-up with PCP and orthopedics.  Follow-up with nephrology if needed."  Today, the patient states that she has improved since her hospital stay. She has had follow ups with multiple providers for her other medical issues. She followed up with cardiology on 01/24/21 and was started on spironolactone. She has a diagnosis of mild nonobstructive CAD that is being medically managed at this point in time. She previously took a statin for this issue, but it was stopped during hospitalization due to elevated liver transaminase. She has been referred to the lipid clinic for further management of this issue. She followed up with endocrinology for her diabetes earlier today. She is currently on Amaryl and Actos, and metformin was added back to her regimen today. She checks her blood sugars in the morning, and reports they have typically been between 105-110 mg/dL.   Stacy [name] remain troublesome. She is having significant pain in her right knee from the gout flare. She is taking Vicodin for this. She is unable to do any weight-bearing on this leg. She also has pain from the gout flare in her right foot. Her other concern today is that she is having significant trouble with sleeping. She has difficulty falling asleep and staying asleep, and this has been significantly impacting her mood. She has experienced racing thoughts when trying to fall asleep, and she has felt down and depressed.  The patient states that she is drinking less alcohol than previously. She now drinks two whiskeys 3-4 times per week, rather than every day. She still smokes, less than 1 PPD. She states she has been  smoking a little more previously due to feelings of stress.  Notes reviewed below :  Cardiology OV 01/24/21.  Hosp DC from early 12/2020  Admit date: 12/19/2020 Discharge date: 12/22/2020   Admitted From: Home Disposition: Home   Recommendations for Outpatient Follow-up:   Follow up with PCP in 1 week with repeat CBC/BMP Outpatient follow-up with nephrology if needed Outpatient follow-up with orthopedics/Dr. Lorin [name] Follow up in ED if [name] worsen or new appear     Home Health: No Equipment/Devices: None   Discharge Condition: Stable CODE STATUS: Full Diet recommendation: Heart healthy/carb modified   Brief/Interim Summary: 56 y.o. female with medical history significant of HTN, HLD, GERD, T2DM, depression with anxiety, gout, nonobstructive CAD, left renal artery stenosis, tobacco abuse treated with abdominal pain, nausea and vomiting and not feeling well along with bilateral knee pain.  On presentation, she was slightly hypotensive, tachycardic.  WBC of 12.4; BUN of 77, creatinine of 9.5, lipase of 62.  Chest x-ray showed no acute findings.  Ultrasound of abdomen showed no acute findings to explain her abdominal pain.  She was started on IV fluids.  Nephrology was consulted.  During the hospitalization, her condition has gradually improved; creatinine is improving.  Orthopedics was consulted and patient underwent bilateral knee arthrocentesis and steroid injection; synovial fluid was consistent with gout flare.  She was also treated with oral prednisone.  Currently her renal function has much improved, nephrology has cleared the patient for discharge.  Currently she is hemodynamically stable, tolerating diet.  She will be discharged home today on oral prednisone and allopurinol will be started.  Follow-up with PCP and orthopedics.  Follow-up with nephrology if needed.   Discharge Diagnoses:    Acute kidney injury/acute renal failure Anion gap metabolic acidosis -Possibly from ATN from dehydration and hypotension -Presented with creatinine of 9.5; treated with IV fluids.  Creatinine much improved to 1.57 today.  - Renal ultrasound negative for hydronephrosis  -nephrology following and has cleared the patient for discharge.  Outpatient follow-up with  nephrology if needed. -Outpatient follow-up of BMP by PCP   Possible UTI -Present on admission.  Treated with 3 days of Rocephin.  No need for any further antibiotics on discharge.  Currently afebrile.   Bilateral knee pain and swelling/?  Acute gout flare -Patient states that she has had similar [name] in the past requiring arthrocentesis which was consistent with acute gout.   - Avoid colchicine because of recent nausea and vomiting -Status post bilateral arthrocentesis by orthopedics on 12/20/2020: Synovial fluid analysis consistent with acute gout.  Bilateral knees were injected with steroids per orthopedics on 12/20/2020. -Continue oral prednisone on discharge 40 mg daily for 7 days.  Outpatient follow-up with orthopedics.     Nausea/vomiting -Questionable cause.  Ultrasound abdomen was negative for acute cause/gallbladder disease. -Much improved.  Currently tolerating diet.   Right upper quadrant pain/shoulder pain -Might be musculoskeletal as well.  Lipase only mildly elevated.  Troponin negative.  Continue current pain management.   Hypomagnesemia -Resolved   Mild hyperkalemia -Resolved   Leukocytosis -Possibly reactive.  Resolved   Renal artery stenosis -Renal duplex showed 1 to 59% stenosis of left renal 3.;  No evidence of right renal artery stenosis.   Normocytic anemia -Questionable cause.  Hemoglobin stable.  Outpatient follow-up   Fatty liver  Mildly elevated AST -Possibly from alcohol use.  Outpatient follow-up with GI.  Abstain from alcohol   Hypertension -Blood pressure on the lower side.  Antihypertensives on hold  Hyperlipidemia -Statin on hold because of slightly elevated LFTs.  Outpatient follow-up with PCP.   GERD -Has completed Protonix treatment as an outpatient.  Follow-up with PCP.   Depression with anxiety -Stable.  Continue Remeron   Adrenal mass, right -Has outpatient follow-up   Tobacco use -Continue nicotine patch.  Patient was  counseled regarding cessation by admitting hospitalist  Cards 1/6    Essential hypertension: Medications were held during hospitalization 12/22 due to acute renal failure and hypotension. She was restarted on clonidine 0.2 mg 3 times a day.  Initial clinic blood pressure is elevated, however on my recheck it has actually decreased to 100/64.  She reports high blood pressure readings at home.  She would like to take as few pills as possible and would like something that may help with lower extremity edema.  We will restart spironolactone 25 mg once daily and reduce clonidine to 0.2 mg twice daily.  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  We will plan to follow-up on her blood pressure in 3 to 6 months depending on her availability.  She reports she currently is missing a lot of work due to multiple appointments and recent hospitalization.   Mild nonobstructive CAD: Coronary CT March 2021 revealed mild disease in the LAD and LCx.  Her coronary calcium score is 50.4, 94th percentile for age/sex, mild calcification of the aortic root.  Medical therapy recommended.  She denies chest pain, dyspnea or other [name] concerning for angina.  No further ischemic evaluation warranted at this time.  Her statin was stopped during hospitalization 12/22 due to elevated transaminase.  This was also a problem in the past for her.  We will refer her to lipid clinic for management of lipids.  Aspirin was stopped during hospitalization due to anemia.  Would favor restarting aspirin 81 mg at next visit if blood counts have stabilized.   Hyperlipidemia LDL goal < 70: LDL 75 6/22.  She was previously on atorvastatin 40 mg.  As noted above statin was stopped during recent hospitalization due to elevated transaminase.  We will refer her to lipid clinic for management.   Bilateral leg edema: She has 1+ pitting edema in both lower extremities.  She is  having a significant amount of right knee swelling and pain due to gout.  She requests a blood pressure medicine that may help reduce some of the swelling.  We will restart spironolactone 25 mg as noted above she wants to take as few pills as possible and this is a potassium sparing diuretic.  Advised that she may not notice significant improvement in joint swelling with spironolactone.  Encouraged leg elevation (toes to nose) to help decrease leg swelling.   Difficulty sleeping: She states she is having difficulty sleeping because her mind is racing and she is having knee pain and requests a prescription for Ambien. States melatonin does not work for her. I encouraged her to use nonpharmacologic methods to help her sleep and suggested the Calm app or other methods that may help her calm her mind before going to sleep.  Encouraged leg elevation to help with pain and swelling.   Tobacco abuse: She continues to smoke less than 1/2 packs/day. Has reduced daytime smoking. Feels that her son who has recently moved back to [place] may help her quit because he is a big encouragement. Complete cessation advised.   04/07/21 Patient seen in return  follow-up.  Patient has hypertension coronary artery calcification diabetes on oral therapy.  On arrival blood pressure 134/90 blood sugar 152.  Patient's had low blood sugars at times.  She is also had low blood pressures on clonidine therefore this drug was stopped.  She does maintain low-dose Aldactone.  She had increased pain and swelling in the right thigh and orthopedics appropriately ordered ultrasound of the lower extremities finding DVT in the right iliac vein and this was on February 24 of this year..  She has now been on the Xarelto.  She is completing the starter pack.  She has had no bleeding problems on Xarelto.  The leg pain is improved.  She still smoking less than 1/2 pack a day of cigarettes.  She was drinking 2 Crown Royal's a day but has reduced her  intake.  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  She lost her job because of all her health issues she was not able to attend regularly at work.  Patient did receive a Pap smear and it was normal.  She follows with endocrinology.  She is also been counseling with our licensed clinical social worker with her depression.  She continues to have insomnia and is requesting a higher dose of the trazodone.  Appetite is poor.  She would like some health screening labs obtained at this visit.  06/12/21 This patient is seen in return for follow-up is a 56 year old female with type 2 diabetes ethanol use tobacco use hypertension.  On arrival blood pressure is elevated 150/80 and she is having difficulty maintaining her diabetic program.  She is losing significant amounts of weight and has had a chronic cough.  We gave her a course of azithromycin a week ago and it persists.  She has some postnasal drainage and significant allergy [name].  She is drinking 3 [place] whiskeys daily.  She struggled to get off the alcohol.  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  She has been on Xarelto therapy now for 2 months.  Patient's had no bleeding with Xarelto  Patient continues to smoke a pack a day of cigarettes  Patient does not follow a healthy diet  Patient follows with endocrinology and they have her on oral Actos Amaryl and metformin.  She is often holding these medications.  She takes the medication she has severe nausea.  She is only eating 1 meal a day and cannot take in much food.  Blood pressure medicines now include the clonidine 0.2 mg twice daily and spironolactone 25 mg daily  She measures her blood pressure at home she gets around 140/90 usually.  7/27 Patient returns in follow-up and had been out of  medications for 2 weeks over the past week she was caring for her father.  She has been back on her medicines now for about 2 days and on arrival blood pressure elevated 167/103  Patient is no longer taking clonidine because of hypotension but she does maintain Aldactone 25 mg daily and valsartan 320 mg daily.  Patient did see our clinical pharmacist Lurena Joiner on June 30 is as documented below.  Saw Lurena Joiner 6/30 Madalen R Admire is a 56 y.o. female who presents for hypertension evaluation, education, and management.  PMH is significant for HTN, CAD, unilateral (L) renal artery stenosis, migraines,  VTE on anticoagulation with Xarelto, GERD, T2DM, bilateral knee synovitis, HLD, GOUT, MDD, tobacco use and alcohol use.    Patient was referred and last seen by Primary Care Provider, Dr. Joya Gaskins, on 06/12/2021.    At last visit, Dr. Joya Gaskins added back valsartan to her regimen.    Recent BP management hx:  Last seen by Dr. Oval Linsey in 2022. Was controlled on amlodipine, HCTZ, valsartan, and spiro. Was hospitalized 12/1 - 12/22/2020 with AKI and her antihypertensives were held. She was restarted on clonidine 0.2 mg TID at that time. NP at [facility] restarted spiro and decreased clonidine on 01/24/21. Of note, pt found to have 1+ pitting edema at that visit. Clonidine was reduced to 0.1 mg BID d/t hypotension by Dr. [name] on 02/04/21. We have seen her a couple of times since with BP being at or close to goal on 1/17 and 04/07/2021. Most recently, pt was seen 06/12/2021 and BP was above goal. Valsartan was restarted.    Today, patient arrives in good spirits and presents without assistance. Denies dizziness, headache, blurred vision, swelling.   Hypertension longstanding currently above goal on current medications. BP goal < 130/80 mmHg. Medication adherence appears appropriate with spiro and valsartan but she is not taking clonidine. RAAS agents can induce or worsen renal insufficiency in patients with bilateral or several  unilateral renal artery stenosis. This pt has a hx of moderate L renal artery stenosis but R renal artery looked good from doppler study in 2021. She has an elevated urine microalbumin but eGFR looks good. Electrolyte status is nl and stable. Will get labs today and if renal function is good now ~1 month since starting valsartan, will optimize to the 320 mg dose. Will not add back thiazide at this time d/t gout history. Will not add amlodipine at this time d/t LE edema but we can consider low dose in the future if we need continued improvement if she can tolerate. -Continued current regimen for now. Will increase valsartan depending on lab results.   -CMP14+eGFR -Counseled on lifestyle modifications for blood pressure control including reduced dietary sodium, increased exercise, adequate sleep. -Encouraged patient to check BP at home and bring log of readings to next visit. Counseled on proper use  Labs from that visit did show normal creatinine and potassium.  Valsartan was increased at that visit to 320 mg daily. Patient's been out of Xarelto for a month.  She had a previous deep venous thrombosis 6 months ago involving the right thigh femoral vein.  She cannot afford the Xarelto the co-pay is too high.  She may need warfarin if she is to stay on anticoagulation.  She states she does have a blood pressure meter at home at home it runs 150/90.  She states her depression is still severe despite 5 mg of Lexapro.  She still smoking 1 pack a day of cigarettes. 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 Posey Pronto 02/27/2014   Tobacco abuse    Tubular adenoma of colon     Past Surgical History:  Procedure Laterality Date   ANAL FISTULECTOMY  01/19/1985   BREAST EXCISIONAL BIOPSY Left    BREAST LUMPECTOMY Left 1988   PAROTIDECTOMY  03/30/2011   Procedure: PAROTIDECTOMY;  Surgeon: Izora Gala, MD;  Location: [facility];  Service: ENT;  Laterality: Right;   WRIST GANGLION EXCISION Left 1988    Family History  Problem Relation Age of Onset   Coronary artery disease Father 32       CAD,CABG   Hypertension Mother    Stroke Mother    Diabetes Sister        uncontrolled   Cancer Sister 22       uterine cancer   Aneurysm Sister        brain   Heart attack Maternal Grandmother    Blindness Paternal Grandmother    Breast cancer Neg Hx    Colon cancer Neg Hx    Esophageal cancer Neg Hx    Rectal cancer Neg Hx    Stomach cancer Neg Hx     Social History   Socioeconomic History   Marital status: Widowed    Spouse name: Not on file   Number of children: 2   Years of education: Not on file   Highest education level: Not on file  Occupational History    Employer: [employer]  Tobacco Use   Smoking status: Every Day    Packs/day: 1.00    Years: 28.00    Total pack years: 28.00    Types: Cigarettes   Smokeless tobacco: Never  Vaping Use   Vaping Use: Never used  Substance and Sexual Activity   Alcohol use: Yes    Alcohol/week: 14.0 standard drinks of alcohol    Types: 14 Standard drinks or equivalent per week   Drug use: No   Sexual activity: Not Currently    Birth control/protection: Post-menopausal  Other Topics Concern   Not on file  Social  History Narrative   Married. 2 children.    Ages 63, 32 y/o ( entered 09/2012)      Works with [employer]   Social Determinants of Health   Financial Resource Strain: Not on file  Food Insecurity: Not on file  Transportation Needs: Not on file  Physical Activity: Not on file  Stress: Not on file  Social Connections: Not on file  Intimate Partner Violence: Not on file    Outpatient Medications Prior to Visit  Medication Sig Dispense Refill   albuterol (VENTOLIN HFA) 108 (90 Base) MCG/ACT inhaler Inhale 2 puffs into the lungs every 4 (four) hours as needed for wheezing or shortness of breath. 6.7 g 1   allopurinol (ZYLOPRIM) 300 MG tablet Take 1 tablet (300 mg total) by mouth daily. 30 tablet 4   colchicine 0.6 MG tablet Take 1 tablet (0.6 mg total) by mouth daily as needed. 20 tablet 0   EPINEPHrine 0.3 mg/0.3 mL IJ SOAJ injection Insert into muscle once with any facial or neck swelling  Instruct in use 1 Device 1  folic acid (FOLVITE) 1 MG tablet Take 1 tablet (1 mg total) by mouth daily. 60 tablet 2   glucose blood (FREESTYLE PRECISION NEO TEST) test strip Use as instructed 100 each 12   HYDROcodone-acetaminophen (NORCO/VICODIN) 5-325 MG tablet Take 1 tablet by mouth every 12 (twelve) hours as needed for moderate pain. 10 tablet 0   hydrOXYzine (VISTARIL) 25 MG capsule SMARTSIG:1 Capsule(s) By Mouth PRN     Lancets (FREESTYLE) lancets Use as instructed 100 each 12   Multiple Vitamins-Minerals (ONE-A-DAY WOMENS VITACRAVES) CHEW Chew 1 each by mouth daily.     nicotine (NICOTINE STEP 1) 21 mg/24hr patch Place 1 patch (21 mg total) onto the skin daily. 28 patch 0   nicotine polacrilex (NICOTINE MINI) 2 MG lozenge Use three times a day as needed for smoking cessation 100 tablet 0   pioglitazone (ACTOS) 15 MG tablet Take 1 tablet (15 mg total) by mouth daily. 60 tablet 3   spironolactone (ALDACTONE) 25 MG tablet Take 1 tablet (25 mg total) by mouth daily. 90 tablet 3    thiamine 100 MG tablet Take 1 tablet (100 mg total) by mouth daily. 60 tablet 2   valsartan (DIOVAN) 320 MG tablet Take 1 tablet (320 mg total) by mouth daily. 90 tablet 1   escitalopram (LEXAPRO) 5 MG tablet Take 1 tablet (5 mg total) by mouth daily. 90 tablet 0   glimepiride (AMARYL) 1 MG tablet TAKE 1 TABLET BY MOUTH DAILY BEFORE SUPPER. 60 tablet 3   metFORMIN (GLUCOPHAGE-XR) 500 MG 24 hr tablet Take 2 tablets (1,000 mg total) by mouth daily with supper. 120 tablet 3   traZODone (DESYREL) 100 MG tablet TAKE 1 TO 2 TABLETS BY MOUTH AS NEEDED FOR SLEEP (Patient not taking: Reported on 08/14/2021) 180 tablet 0   allopurinol (ZYLOPRIM) 100 MG tablet Take 1 tablet by mouth daily.     cloNIDine (CATAPRES) 0.2 MG tablet Take 1 tablet (0.2 mg total) by mouth 2 (two) times daily. (Patient not taking: Reported on 08/14/2021) 60 tablet 4   rivaroxaban (XARELTO) 20 MG TABS tablet Take 1 tablet (20 mg total) by mouth daily with supper. (Patient not taking: Reported on 08/14/2021) 90 tablet 1   No facility-administered medications prior to visit.    Allergies  Allergen Reactions   Almond Meal Anaphylaxis and Swelling   Other Anaphylaxis    Almond Butter   Aspirin Nausea Only    ROS Review of Systems  Constitutional:  Positive for appetite change. Negative for fatigue.  HENT: Negative.  Negative for ear pain, postnasal drip, rhinorrhea, sinus pressure, sore throat, trouble swallowing and voice change.   Eyes: Negative.   Respiratory: Negative.  Negative for apnea, cough, choking, chest tightness, shortness of breath, wheezing and stridor.   Cardiovascular: Negative.  Negative for chest pain, palpitations and leg swelling.  Gastrointestinal: Negative.  Negative for abdominal distention, abdominal pain, nausea and vomiting.  Endocrine: Negative.   Genitourinary: Negative.  Negative for dysuria and hematuria.  Musculoskeletal:  Positive for arthralgias and joint swelling. Negative for myalgias.   Skin: Negative.  Negative for rash.  Allergic/Immunologic: Negative.  Negative for environmental allergies and food allergies.  Neurological:  Negative for dizziness, syncope, weakness and headaches.  Hematological: Negative.  Negative for adenopathy. Does not bruise/bleed easily.  Psychiatric/Behavioral:  Positive for dysphoric mood and sleep disturbance. Negative for agitation and suicidal ideas. The patient is not nervous/anxious.       Objective:    Physical Exam Vitals reviewed.  Constitutional:  Appearance: Normal appearance. She is well-developed and normal weight. She is not diaphoretic.  HENT:     Head: Normocephalic and atraumatic.     Nose: No nasal deformity, septal deviation, mucosal edema or rhinorrhea.     Right Sinus: No maxillary sinus tenderness or frontal sinus tenderness.     Left Sinus: No maxillary sinus tenderness or frontal sinus tenderness.     Mouth/Throat:     Mouth: Mucous membranes are moist.     Pharynx: Oropharynx is clear. No oropharyngeal exudate.  Eyes:     General: No scleral icterus.    Conjunctiva/sclera: Conjunctivae normal.     Pupils: Pupils are equal, round, and reactive to light.  Neck:     Thyroid: No thyromegaly.     Vascular: No carotid bruit or JVD.     Trachea: Trachea normal. No tracheal tenderness or tracheal deviation.  Cardiovascular:     Rate and Rhythm: Normal rate and regular rhythm.     Chest Wall: PMI is not displaced.     Pulses: Normal pulses. No decreased pulses.     Heart sounds: Normal heart sounds, S1 normal and S2 normal. Heart sounds not distant. No murmur heard.    No systolic murmur is present.     No diastolic murmur is present.     No friction rub. No gallop. No S3 or S4 sounds.  Pulmonary:     Effort: Pulmonary effort is normal. No tachypnea, accessory muscle usage or respiratory distress.     Breath sounds: No stridor. No decreased breath sounds, wheezing, rhonchi or rales.  Chest:     Chest wall: No  tenderness.  Abdominal:     General: Abdomen is flat. Bowel sounds are normal. There is no distension.     Palpations: Abdomen is soft. Abdomen is not rigid. There is no mass.     Tenderness: There is no abdominal tenderness. There is no guarding or rebound.  Musculoskeletal:        General: No tenderness. Normal range of motion.     Cervical back: Normal range of motion and neck supple. No edema, erythema or rigidity. No muscular tenderness. Normal range of motion.     Right lower leg: No edema.     Left lower leg: No edema.     Right foot: Tenderness: tophi present on metatarsal of first toe.     Left foot: Normal.     Comments: No pain or edema or tenderness in the right leg There is bruising behind the posterior aspect of the left knee  Lymphadenopathy:     Head:     Right side of head: No submental or submandibular adenopathy.     Left side of head: No submental or submandibular adenopathy.     Cervical: No cervical adenopathy.  Skin:    General: Skin is warm and dry.     Coloration: Skin is not pale.     Findings: No rash.     Nails: There is no clubbing.  Neurological:     Mental Status: She is alert and oriented to person, place, and time.     Sensory: No sensory deficit.  Psychiatric:        Mood and Affect: Mood normal.        Speech: Speech normal.        Behavior: Behavior normal.        Thought Content: Thought content normal.        Judgment: Judgment normal.  BP (!) 167/103   Pulse (!) 102   Temp 98.4 F (36.9 C) (Oral)   Ht '5\' 3"'  (1.6 m)   Wt 127 lb 6.4 oz (57.8 kg)   SpO2 100%   BMI 22.57 kg/m  Wt Readings from Last 3 Encounters:  08/14/21 127 lb 6.4 oz (57.8 kg)  06/12/21 125 lb (56.7 kg)  05/27/21 138 lb (62.6 kg)     Health Maintenance Due  Topic Date Due   Zoster Vaccines- Shingrix (1 of 2) Never done   OPHTHALMOLOGY EXAM  06/10/2017   COVID-19 Vaccine (3 - Moderna series) 04/21/2019    There are no preventive care reminders to  display for this patient. Vas US Renal Artery Bilateral 12/19/20  Right: Normal size right kidney. Normal right Resisitive Index. No         evidence of right renal artery stenosis. RRV flow present.  Left:  Normal size of left kidney. Normal left Resistive Index.         1-59% stenosis of the left renal artery. LRV flow present.  Mesenteric:  Normal Celiac artery findings.      Cor CT 03/29/19   IMPRESSION: LAD is a large vessel with mild (25-49%) calcified plaque in the proximal portion. The mid and distal portion with no plaque. D1 small vessel with no plaque. LCX is a non-dominant artery that gives rise to one large OM1 branch. There is minimal (1-24%) calcified plaque in the proximal portion of the LCX. The mid and distal portion with no plaque. There is no plaque in the OM1 branch. 1. Coronary calcium score of 50.4. This was 9 percentile for age and sex matched control. 2. Normal coronary origin with right dominance. 3. Mild Coronary artery disease. CADRADS 2. Recommend medical therapy. 4. Mild calcification of the aortic root.  Venous Dopplers February 24 reviewed showed DVT common femoral vein right leg and some clot up in the right iliac vein Lab Results  Component Value Date   TSH 0.690 04/07/2021   Lab Results  Component Value Date   WBC 7.2 06/12/2021   HGB 10.2 (L) 06/12/2021   HCT 34.0 06/12/2021   MCV 68 (L) 06/12/2021   PLT 610 (H) 06/12/2021   Lab Results  Component Value Date   NA 146 (H) 07/18/2021   K 3.8 07/18/2021   CO2 23 07/18/2021   GLUCOSE 89 07/18/2021   BUN 14 07/18/2021   CREATININE 0.63 07/18/2021   BILITOT 0.3 07/18/2021   ALKPHOS 89 07/18/2021   AST 21 07/18/2021   ALT 17 07/18/2021   PROT 7.4 07/18/2021   ALBUMIN 4.0 07/18/2021   CALCIUM 9.7 07/18/2021   ANIONGAP 6 12/22/2020   EGFR 104 07/18/2021   GFR 95.71 06/06/2019   Lab Results  Component Value Date   CHOL 173 04/07/2021   Lab Results  Component Value Date   HDL  54 04/07/2021   Lab Results  Component Value Date   LDLCALC 95 04/07/2021   Lab Results  Component Value Date   TRIG 134 04/07/2021   Lab Results  Component Value Date   CHOLHDL 3.2 04/07/2021   Lab Results  Component Value Date   HGBA1C 6.6 (H) 04/07/2021      Assessment & Plan:   Problem List Items Addressed This Visit       Cardiovascular and Mediastinum   Hypertension    Not well controlled she is not compliant with diet she is still smoking a pack a day of cigarettes  Plan is to add amlodipine 10 mg daily and continue Aldactone 25 mg daily and valsartan 320 mg daily.  Patient will follow-up with Dr. Oval Linsey she has an upcoming appointment encouraged to keep this at the advanced hypertension clinic      Relevant Medications   amLODipine (NORVASC) 10 MG tablet   Acute deep vein thrombosis (DVT) of femoral vein of right lower extremity ([place]) - Primary    Plan is to reimage the lower extremities off Xarelto and obtain a D-dimer if studies are unrevealing we will monitor off medications      Relevant Medications   amLODipine (NORVASC) 10 MG tablet   Other Relevant Orders   D-dimer, quantitative   VAS [place] LOWER EXTREMITY VENOUS (DVT) (Completed)   CBC with Differential/Platelet     Endocrine   Type 2 diabetes mellitus with other specified complication [facility])    Endocrinology indicates she is to stay on oral medications no changes have been made      Relevant Medications   glimepiride (AMARYL) 1 MG tablet   metFORMIN (GLUCOPHAGE-XR) 500 MG 24 hr tablet     Other   Hyperlipemia    Continue statins      Relevant Medications   amLODipine (NORVASC) 10 MG tablet   MDD (major depressive disorder)    Plan increase Lexapro 10 mg daily      Relevant Medications   escitalopram (LEXAPRO) 10 MG tablet   Tobacco use    Discussed smoking cessation. Rx sent for nicotine patches and lozenges. Referral to [place], LCSW for behavioral therapy.      Current smoking  consumption amount: 1 ppd  Dicsussion on advise to quit smoking and smoking impacts: CV lung impacts  Patient's willingness to quit:  Interested   Methods to quit smoking discussed:  Behavioral mod /meds  Medication management of smoking session drugs discussed:nicotine replacement: combo of nicotine patch and lozenge  Resources provided:  AVS   Setting quit date not established  Follow-up arranged 2 mo   Time spent counseling the patient:  5 min       EtOH dependence ([place])    Patient claims to be abstinent from alcohol at this time      RESOLVED: Chronic anticoagulation    Off Xarelto      Meds ordered this encounter  Medications   amLODipine (NORVASC) 10 MG tablet    Sig: Take 1 tablet (10 mg total) by mouth daily.    Dispense:  90 tablet    Refill:  2   escitalopram (LEXAPRO) 10 MG tablet    Sig: Take 1 tablet (10 mg total) by mouth daily.    Dispense:  90 tablet    Refill:  2   glimepiride (AMARYL) 1 MG tablet    Sig: TAKE 1 TABLET BY MOUTH DAILY BEFORE SUPPER.    Dispense:  60 tablet    Refill:  3   metFORMIN (GLUCOPHAGE-XR) 500 MG 24 hr tablet    Sig: Take 2 tablets (1,000 mg total) by mouth daily with supper.    Dispense:  120 tablet    Refill:  3   Follow-up: Return in about 6 weeks (around 09/25/2021).    Asencion Noble, MD

## 2021-08-14 NOTE — Patient Instructions (Signed)
Keep upcoming appoint with cardiology  Resume amlodipine 10 mg daily for blood pressure  Increase Lexapro to 10 mg daily  Stay off the Xarelto for now  No change in other medications  Labs today include D-dimer and complete blood count  Venous ultrasound will be obtained  Return to Dr. Joya Gaskins 6 weeks

## 2021-08-14 NOTE — Assessment & Plan Note (Signed)
Patient claims to be abstinent from alcohol at this time

## 2021-08-14 NOTE — Assessment & Plan Note (Signed)
Not well controlled she is not compliant with diet she is still smoking a pack a day of cigarettes  Plan is to add amlodipine 10 mg daily and continue Aldactone 25 mg daily and valsartan 320 mg daily.  Patient will follow-up with Dr. Oval Linsey she has an upcoming appointment encouraged to keep this at the advanced hypertension clinic

## 2021-08-14 NOTE — Assessment & Plan Note (Signed)
Discussed smoking cessation. Rx sent for nicotine patches and lozenges. Referral to [place], LCSW for behavioral therapy.      Current smoking consumption amount: 1 ppd  Dicsussion on advise to quit smoking and smoking impacts: CV lung impacts  Patient's willingness to quit:  Interested   Methods to quit smoking discussed:  Behavioral mod /meds  Medication management of smoking session drugs discussed:nicotine replacement: combo of nicotine patch and lozenge  Resources provided:  AVS   Setting quit date not established  Follow-up arranged 2 mo   Time spent counseling the patient:  5 min  

## 2021-08-14 NOTE — Assessment & Plan Note (Signed)
Off Xarelto

## 2021-08-14 NOTE — Telephone Encounter (Signed)
Noted ,waiting on MD overread

## 2021-08-14 NOTE — Progress Notes (Signed)
Allergies Discuss other Xarelto options. Bruise behind left knee

## 2021-08-14 NOTE — Progress Notes (Signed)
Bilateral lower extremity venous duplex has been completed. Preliminary results can be found in CV Proc through chart review.  Results were given to [facility] at Dr. Bettina Gavia office.  08/14/21 12:02 PM Carlos Levering RVT

## 2021-08-14 NOTE — Assessment & Plan Note (Signed)
Endocrinology indicates she is to stay on oral medications no changes have been made

## 2021-08-14 NOTE — Assessment & Plan Note (Signed)
Plan is to reimage the lower extremities off Xarelto and obtain a D-dimer if studies are unrevealing we will monitor off medications

## 2021-08-15 ENCOUNTER — Telehealth: Payer: Self-pay

## 2021-08-15 ENCOUNTER — Other Ambulatory Visit: Payer: Self-pay

## 2021-08-15 LAB — CBC WITH DIFFERENTIAL/PLATELET
Basophils Absolute: 0.1 10*3/uL (ref 0.0–0.2)
Basos: 1 %
EOS (ABSOLUTE): 0.1 10*3/uL (ref 0.0–0.4)
Eos: 1 %
Hematocrit: 36.4 % (ref 34.0–46.6)
Hemoglobin: 10.7 g/dL — ABNORMAL LOW (ref 11.1–15.9)
Immature Grans (Abs): 0 10*3/uL (ref 0.0–0.1)
Immature Granulocytes: 0 %
Lymphocytes Absolute: 1.6 10*3/uL (ref 0.7–3.1)
Lymphs: 16 %
MCH: 20.7 pg — ABNORMAL LOW (ref 26.6–33.0)
MCHC: 29.4 g/dL — ABNORMAL LOW (ref 31.5–35.7)
MCV: 71 fL — ABNORMAL LOW (ref 79–97)
Monocytes Absolute: 0.5 10*3/uL (ref 0.1–0.9)
Monocytes: 6 %
Neutrophils Absolute: 7.4 10*3/uL — ABNORMAL HIGH (ref 1.4–7.0)
Neutrophils: 76 %
Platelets: 523 10*3/uL — ABNORMAL HIGH (ref 150–450)
RBC: 5.16 x10E6/uL (ref 3.77–5.28)
RDW: 20.1 % — ABNORMAL HIGH (ref 11.7–15.4)
WBC: 9.6 10*3/uL (ref 3.4–10.8)

## 2021-08-15 LAB — D-DIMER, QUANTITATIVE: D-DIMER: 0.71 mg/L FEU — ABNORMAL HIGH (ref 0.00–0.49)

## 2021-08-15 NOTE — Telephone Encounter (Signed)
Thank you so much

## 2021-08-15 NOTE — Telephone Encounter (Signed)
Patient's copay was over $100 with her insurance at [facility].  A $10 copay savings card is available to the patient and I called [facility] to provide the billing information needed for processing.  [facility] processed the X[facility] and confirmed that the copay is $10.  Patient has been notified and a copy of the savings card will be mailed to her home address.  Bin: [id number] ID: [id number] [id number] Sia PCN: [id number]

## 2021-08-17 NOTE — Progress Notes (Signed)
Pt aware of results will restart xarelto with coupon card

## 2021-08-19 NOTE — Telephone Encounter (Signed)
Patient made aware of results by Dr. Joya Gaskins. See note in chart.

## 2021-08-20 ENCOUNTER — Other Ambulatory Visit: Payer: Self-pay

## 2021-08-20 ENCOUNTER — Ambulatory Visit: Payer: Self-pay

## 2021-08-20 NOTE — Patient Instructions (Addendum)
It was nice talking to you today!  Continue taking your medications as prescribed.   Please check your blood pressure daily. If the top number (systolic blood pressure) is less than 100 mmHg, please contact the [facility] office or send a MyChart message.   I will call you in one week to see if your lightheadedness has resolved.   Take care!  Joseph Art, Pharm.D. PGY-2 Ambulatory Care Pharmacy Resident

## 2021-08-20 NOTE — Telephone Encounter (Signed)
  Chief Complaint: Low BP, Dizziness Symptoms: Dizziness Frequency: Since starting new medication Pertinent Negatives: Patient denies  Disposition: '[]'$ ED /'[x]'$ Urgent Care (no appt availability in office) / '[]'$ Appointment(In office/virtual)/ '[]'$  [facility]/ '[]'$ Home Care/ '[]'$ Refused Recommended Disposition /'[]'$ [facility]/ '[]'$  Follow-up with PCP Additional Notes: Pt has recently started a new BP medication. Since starting pt has been dizzy. BP's have been low and HR high. Last BP was 91/59, HR 113.  Reason for Disposition  [5] Systolic BP [id number] AND [1] taking blood pressure medications AND [3] dizzy, lightheaded or weak  Answer Assessment - Initial Assessment Questions 1. BLOOD PRESSURE: "What is the blood pressure?" "Did you take at least two measurements 5 minutes apart?"     87/68,  2. ONSET: "When did you take your blood pressure?"     87/68 at 3:01 3. HOW: "How did you obtain the blood pressure?" (e.g., visiting nurse, automatic home BP monitor)     Home monitor 4. HISTORY: "Do you have a history of low blood pressure?" "What is your blood pressure normally?"     Was HTN, now low 5. MEDICINES: "Are you taking any medications for blood pressure?" If Yes, ask: "Have they been changed recently?"     PT stated as per med list  6. PULSE RATE: "Do you know what your pulse rate is?"      123 7. OTHER SYMPTOMS: "Have you been sick recently?" "Have you had a recent injury?"     no 8. PREGNANCY: "Is there any chance you are pregnant?" "When was your last menstrual period?"     no  Protocols used: Blood Pressure - Low-A-AH

## 2021-08-20 NOTE — Progress Notes (Signed)
Patient appearing on report for [employer] [facility] - Hypertension Control report due to last documented ambulatory blood pressure of 167/103 mmHg on 08/14/2021. Next appointment with PCP is 10/02/2021   Outreached patient to discuss hypertension control and medication management.   Current antihypertensives: amlodipine 10 mg (newly added 08/14/21), spironolactone 25 mg, valsartan 320 mg   Patient has an automated upper arm home BP machine.  Current blood pressure readings: 138/85 mHg (patient was able to check BP while on the phone)  Patient reports symptoms including dizziness, lightheadedness, and sometimes feels like she's swimming. Has not had any hypotensive BP readings.   Patient reports side effects related to amlodipine- feels like her head is swimming.     Assessment/Plan: - Currently controlled based on home BP reading today. Feelings of light headedness may be related to her body adjusting to a more 'normal' blood pressure - Recommend patient check her blood pressure daily and call with any SBP <100 mmHg.  - F/up via phone in 1 week to see if dizziness has resolved.  Joseph Art, Pharm.D. PGY-2 Ambulatory Care Pharmacy Resident 08/20/2021 3:04 PM

## 2021-08-21 NOTE — Telephone Encounter (Signed)
I spoke with the patient last night and told her to HOLD amlodipine and to call in with BP numbers today  We may need to resume at '5mg'$  daily if BP rises   She has stopped smoking which may account for the reductions in Bp

## 2021-08-22 ENCOUNTER — Other Ambulatory Visit: Payer: Self-pay | Admitting: Pharmacist

## 2021-08-22 DIAGNOSIS — I10 Essential (primary) hypertension: Secondary | ICD-10-CM

## 2021-08-22 MED ORDER — AMLODIPINE BESYLATE 5 MG PO TABS: 5.0000 mg | ORAL_TABLET | Freq: Every day | ORAL | 1 refills | Status: DC

## 2021-08-22 NOTE — Telephone Encounter (Signed)
I totally agree with this [place] !

## 2021-08-25 ENCOUNTER — Encounter: Payer: Self-pay | Admitting: Critical Care Medicine

## 2021-08-25 NOTE — Progress Notes (Signed)
Home bp reading on 08/20/21 had amlodipine reduced to  '5mg'$  daily

## 2021-08-27 ENCOUNTER — Other Ambulatory Visit: Payer: Self-pay

## 2021-08-27 NOTE — Progress Notes (Signed)
Attempted to contacted patient for follow up regarding hypotensive symptoms. Patient did not answer the phone and a HIPAA compliant voicemail was left.   Joseph Art, Pharm.D. PGY-2 Ambulatory Care Pharmacy Resident 08/27/2021 2:01 PM

## 2021-09-08 ENCOUNTER — Other Ambulatory Visit: Payer: Self-pay | Admitting: Critical Care Medicine

## 2021-09-09 MED ORDER — COLCHICINE 0.6 MG PO TABS
0.6000 mg | ORAL_TABLET | Freq: Every day | ORAL | 0 refills | Status: DC | PRN
Start: 2021-09-09 — End: 2021-10-28

## 2021-09-10 ENCOUNTER — Encounter (HOSPITAL_BASED_OUTPATIENT_CLINIC_OR_DEPARTMENT_OTHER): Payer: Self-pay

## 2021-09-10 ENCOUNTER — Ambulatory Visit (HOSPITAL_BASED_OUTPATIENT_CLINIC_OR_DEPARTMENT_OTHER): Payer: Commercial Managed Care - HMO | Admitting: Cardiovascular Disease

## 2021-09-15 ENCOUNTER — Ambulatory Visit (HOSPITAL_BASED_OUTPATIENT_CLINIC_OR_DEPARTMENT_OTHER): Payer: Commercial Managed Care - HMO | Admitting: Family

## 2021-10-02 ENCOUNTER — Ambulatory Visit: Payer: Commercial Managed Care - HMO | Admitting: Critical Care Medicine

## 2021-10-28 MED ORDER — COLCHICINE 0.6 MG PO TABS: 0.6000 mg | ORAL_TABLET | Freq: Two times a day (BID) | ORAL | 1 refills | Status: DC | PRN

## 2021-11-13 ENCOUNTER — Ambulatory Visit (HOSPITAL_BASED_OUTPATIENT_CLINIC_OR_DEPARTMENT_OTHER): Payer: Commercial Managed Care - HMO | Admitting: Cardiovascular Disease

## 2021-11-13 ENCOUNTER — Encounter (HOSPITAL_BASED_OUTPATIENT_CLINIC_OR_DEPARTMENT_OTHER): Payer: Self-pay | Admitting: Cardiovascular Disease

## 2021-11-13 VITALS — BP 138/92 | HR 89 | Ht 63.0 in | Wt 133.8 lb

## 2021-11-13 DIAGNOSIS — I701 Atherosclerosis of renal artery: Secondary | ICD-10-CM

## 2021-11-13 DIAGNOSIS — I10 Essential (primary) hypertension: Secondary | ICD-10-CM | POA: Diagnosis not present

## 2021-11-13 DIAGNOSIS — F439 Reaction to severe stress, unspecified: Secondary | ICD-10-CM

## 2021-11-13 DIAGNOSIS — I2510 Atherosclerotic heart disease of native coronary artery without angina pectoris: Secondary | ICD-10-CM | POA: Diagnosis not present

## 2021-11-13 DIAGNOSIS — Z720 Tobacco use: Secondary | ICD-10-CM

## 2021-11-13 DIAGNOSIS — R0602 Shortness of breath: Secondary | ICD-10-CM

## 2021-11-13 DIAGNOSIS — D3501 Benign neoplasm of right adrenal gland: Secondary | ICD-10-CM

## 2021-11-13 DIAGNOSIS — E7800 Pure hypercholesterolemia, unspecified: Secondary | ICD-10-CM

## 2021-11-13 MED ORDER — VALSARTAN-HYDROCHLOROTHIAZIDE 320-12.5 MG PO TABS: 1.0000 | ORAL_TABLET | Freq: Every day | ORAL | 3 refills | Status: DC

## 2021-11-13 NOTE — Progress Notes (Signed)
Cardiology Office Note    Date:  11/13/2021   ID:  Stacy [name], DOB 1965-05-13, MRN [id number]  PCP:  [name], MD  Cardiologist:  Skeet Latch, MD  Electrophysiologist:  None   Evaluation Performed:  Follow-Up Visit  Chief Complaint:  Hypertension, hyperlipidemia  History of Present Illness:    Stacy [name] is a 56 y.o. female with mild CAD, hypertension, diabetes, fatty liver disease, elevated LFTs, and tobacco abuse who presents for follow up.  Stacy [name] saw Dr. Coralyn Mark and was referred for a CT of the chest, abdomen, and pelvis for unintentional weight loss and tobacco abuse.  On that CT she was noted to have mild to moderate calcified plaque in the thoracic aorta and left anterior descending coronary artery calcification.  Therefore she was referred to cardiology for further evaluation.  Stacy [name] was feeling well but not physically active.  She had no exertional symptoms so prevention was stressed.  Her husband died unexpectedly of a heart attack a few years ago.  He was the main cook.  She reported exertional dyspnea and was referred for coronary CT-A 03/29/2019 that revealed mild plaque in the LAD and minimal plaque in the left circumflex.  Atorvastatin was increased due to these findings.   [name] blood pressure was elevated.  Her PCP had recommended switching from HCTZ to amlodipine due to hypokalemia.  She did make this change and has found that her blood pressure continued to be elevated.  She also had swelling in her ankles.  She was switched to valsartan/HCTZ and amlodipine was reduced to 5 mg.  This has helped with the swelling.  At her last appointment her blood pressure remained above goal.  Valsartan was increased to 320 mg and hydrochlorothiazide 25 mg.  She had renal artery Dopplers that revealed mild to moderate blockage on the left and no obstruction on the right.  Labs were negative for hyperaldosteronism.  At her endocrinology appointment her  spironolactone was increased to 50 mg.    Stacy [name] followed up with our pharmacist for her lipids. Her LFTs were elevated on atorvastatin. The dose was reduced to 20 mg from 40 mg. However she had only actually been taking 20 mg. LFTs normalized on repeat. Based on this atorvastatin was increased to '40mg'$ .  She also expressed interest in smoking cessation.  She thinks that she will need to get through the holidays.  Her BP has been in the 140s-160s.  On looking at her medications she realizes that she has not been taking valsartan/HCTZ.  Her father and sister have been ill and she has been stressed.   She has not been getting much exercise but denies any exertional chest pain or shortness of breath.  She has been afraid of taking Wellbutrin in the past because her mom had a seizure when she tried to take it.  It is also not compatible with Prozac.  She has a prescription for Chantix but never started taking it.  At her last appointment Stacy [name]'s blood pressure was better controlled and she was working on reducing her tobacco use.  She reports feeling generally well but tired. She has been experiencing joint problems, particularly in her knees. The patient denies any chest pain or pressure and states that her breathing has been relatively good, although she sometimes feels winded upon exertion, which is more than expected. She believes her breathing is similar to when she had a coronary CT-A  scan a couple of  years ago.  The patient's blood pressure has been around 140/86. She was previously on valsartan with hydrochlorothiazide but is now only taking valsartan at 320 mg. She is unsure why the medication was changed and reports that her blood pressure was on the lower side when she was on clonidine.   She admits to smoking almost a pack of cigarettes per day and reports having a poor appetite. She expresses interest in seeing a counselor and obtaining a new OBGYN, requesting referrals for both in  Stacy [name].  She has been stressed since her son and 107-year-old grandson moved in.  She is also caring for her father who has been ill.  Prior CV studies:   The following studies were reviewed today:  Renal artery Dopplers 06/2019: Renal:     Right: Normal size right kidney. Normal right Resistive Index.         Normal cortical thickness of right kidney. No evidence of         right renal artery stenosis. RRV flow present.  Left:  Normal size of left kidney. Normal left Resistive Index.         Normal cortical thickness of the left kidney. 1-59% stenosis         of the left renal artery. LRV flow present. Possible high end         range of 1-59% stenosed.   EKG:  08/02/19: Sinus rhythm.  Rate 95 bpm.  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 Posey Pronto 02/27/2014   Tobacco abuse    Tubular adenoma of colon    Past Surgical History:  Procedure Laterality Date   ANAL FISTULECTOMY  01/19/1985   BREAST EXCISIONAL BIOPSY Left    BREAST LUMPECTOMY Left 1988   PAROTIDECTOMY  03/30/2011   Procedure: PAROTIDECTOMY;  Surgeon: Izora Gala, MD;  Location: [facility];  Service: ENT;  Laterality: Right;   WRIST GANGLION EXCISION Left 1988     Current Meds  Medication Sig    valsartan-hydrochlorothiazide (DIOVAN-HCT) 320-12.5 MG tablet Take 1 tablet by mouth daily.     Allergies:   Almond meal, Other, and Aspirin   Social History   Tobacco Use   Smoking status: Every Day    Packs/day: 1.00    Years: 28.00    Total pack years: 28.00    Types: Cigarettes   Smokeless tobacco: Never  Vaping Use   Vaping Use: Never used  Substance Use Topics   Alcohol use: Yes    Alcohol/week: 14.0 standard drinks of alcohol    Types: 14 Standard drinks  or equivalent per week   Drug use: No     Family Hx: The patient's family history includes Aneurysm in her sister; Blindness in her paternal grandmother; Cancer (age of onset: 87) in her sister; Coronary artery disease (age of onset: 75) in her father; Diabetes in her sister; Heart attack in her maternal grandmother; Hypertension in her mother; Stroke in her mother. There is no history of Breast cancer, Colon cancer, Esophageal cancer, Rectal cancer, or Stomach cancer.  ROS:   Please see the history of present illness.     All other systems reviewed and are negative.   Labs/Other Tests and Data Reviewed:    Recent Labs: 12/22/2020: Magnesium 2.2 04/07/2021: TSH 0.690 07/18/2021: ALT 17; BUN 14; Creatinine, Ser 0.63; Potassium 3.8; Sodium 146 08/14/2021: Hemoglobin 10.7; Platelets 523   Recent Lipid Panel Lab Results  Component Value Date/Time   CHOL 173 04/07/2021 11:11 AM   TRIG 134 04/07/2021 11:11 AM   HDL 54 04/07/2021 11:11 AM   CHOLHDL 3.2 04/07/2021 11:11 AM   CHOLHDL 3 01/10/2019 09:18 AM   LDLCALC 95 04/07/2021 11:11 AM    Wt Readings from Last 3 Encounters:  11/13/21 133 lb 12.8 oz (60.7 kg)  08/14/21 127 lb 6.4 oz (57.8 kg)  06/12/21 125 lb (56.7 kg)     Exam:    BP (!) 138/92   Pulse 89   Ht '5\' 3"'$  (1.6 m)   Wt 133 lb 12.8 oz (60.7 kg)   BMI 23.70 kg/m  GENERAL: Well-appearing.  No acute distress. HEENT: Pupils equal round.  Oral mucosa unremarkable NECK:  No jugular venous distention,  no visible thyromegaly EXT:  No edema, no cyanosis no clubbing SKIN:  No rashes no nodules NEURO:  Speech fluent.  Cranial nerves grossly intact.  Moves all 4 extremities freely PSYCH:  Cognitively intact, oriented to person place and time   ASSESSMENT & PLAN:    # Coronary and thoracic aorta calcifcation: # Pure hypercholesterolemia: # Shortness of breath: Coronary CT-A 03/2019 revealed nonobstructive disease.  However she is in the 98 percentile for calcium score.    We are treating her aggressively for primary prevention.  Continue aspirin '81mg'$ .  Atorvastatin was increased to 40 mg.  LFTs were previously elevated on higher doses of her statin.  Repeat fasting lipids and CMP in 1 week.  She continues to complain of shortness of breath.  We will get an echo.  She does appear euvolemic on exam.  # Renal artery stenosis:  1-59% stenosis on the L.  Repeat renal artery Dopplers.  She had nausea with aspirin.  She is no longer on her atorvastatin.  Check fasting lipids in a week and reassess.  # Hypertension:  BP is above goal.  We will resume HCTZ 12.5.  Continue amlodipine, spironolactone, and we will add the HCTZ to combination with her valsartan so her dose will be 320/12.5.  Check CMP in a week.  Continue checking blood pressures at home and work on smoking cessation, stress reduction, and limiting sodium intake.   # Tobacco abuse: Ms. Shall is interested in quitting.  We will have her talk with her care guide for smoking cessation and stress management.      Medication Adjustments/Labs and Tests Ordered: Current medicines are reviewed at length with the patient today.  Concerns regarding medicines are outlined above.    Tests Ordered: Orders Placed This Encounter  Procedures   Lipid panel   Comprehensive metabolic panel   Ambulatory referral  to Psychology   ECHOCARDIOGRAM COMPLETE   VAS US RENAL ARTERY DUPLEX   Medication Changes: Meds ordered this encounter  Medications    valsartan-hydrochlorothiazide (DIOVAN-HCT) 320-12.5 MG tablet    Sig: Take 1 tablet by mouth daily.    Dispense:  90 tablet    Refill:  3    D/C VALSARTAN    Disposition:  Follow up with Jenna Ardoin C. Oval Linsey, MD, [facility] in 1 year.  APP or PharmD in 1 month.  Signed, Skeet Latch, MD  11/13/2021 9:12 AM    [facility] [place], [place], [place]  [address] Phone: [phone]; Fax: [phone]

## 2021-11-13 NOTE — Patient Instructions (Addendum)
Medication Instructions:  START VALSARTAN HCT 320-12.5 MG DAILY   STOP VALSARTAN   *If you need a refill on your cardiac medications before your next appointment, please call your pharmacy*  Lab Work: FASTING LP/CMET IN 1 WEEK   If you have labs (blood work) drawn today and your tests are completely normal, you will receive your results only by: [place] (if you have MyChart) OR A paper copy in the mail If you have any lab test that is abnormal or we need to change your treatment, we will call you to review the results.  Testing/Procedures: Your physician has requested that you have a renal artery duplex. During this test, an ultrasound is used to evaluate blood flow to the kidneys. Allow one hour for this exam. Do not eat after midnight the day before and avoid carbonated beverages. Take your medications as you usually do.  Your physician has requested that you have an echocardiogram. Echocardiography is a painless test that uses sound waves to create images of your heart. It provides your doctor with information about the size and shape of your heart and how well your heart's chambers and valves are working. This procedure takes approximately one hour. There are no restrictions for this procedure. Please do NOT wear cologne, perfume, aftershave, or lotions (deodorant is allowed). Please arrive 15 minutes prior to your appointment time.  Follow-Up: At [facility], you and your health needs are our priority.  As part of our continuing mission to provide you with exceptional heart care, we have created designated Provider Care Teams.  These Care Teams include your primary Cardiologist (physician) and Advanced Practice Providers (APPs -  Physician Assistants and Nurse Practitioners) who all work together to provide you with the care you need, when you need it.  We recommend signing up for the patient portal called "MyChart".  Sign up information is provided on this After Visit  Summary.  MyChart is used to connect with patients for Virtual Visits (Telemedicine).  Patients are able to view lab/test results, encounter notes, upcoming appointments, etc.  Non-urgent messages can be sent to your provider as well.   To learn more about what you can do with MyChart, go to [email or web address].    Your next appointment:   12 month(s)  The format for your next appointment:   In Person  Provider:   Skeet Latch, MD   1  [place] W NP   Other Instructions WILL HAVE AMY, OUR CARE GUIDE TO REACH OUT TO [place]   DR COUSINS FOR OBGYN  Get online care: [email or web address] Address: [address], [place], [place] [address] Hours:  Open ? Closes 5?PM Phone: [phone]  Emma  IF YOU DO NOT HEAR FROM HIM IN 2 WEEKS CALL THE OFFICE TO FOLLOW UP   Important Information About Sugar

## 2021-11-13 NOTE — Assessment & Plan Note (Signed)
Blood pressure above goal.  HCTZ has been held.

## 2021-11-14 ENCOUNTER — Telehealth: Payer: Self-pay

## 2021-11-14 DIAGNOSIS — Z0000 Encounter for general adult medical examination without abnormal findings: Secondary | ICD-10-CM

## 2021-11-14 NOTE — Telephone Encounter (Signed)
Called patient per referral from Dr. Oval Linsey for smoking cessation and stress. Patient did not answer. Left message for patient to return call to discuss health coaching in detail.   Ismar Yabut [facility] Guide, Health Coach [address] [place] [address] Telephone: [phone] Email: [email or web address]

## 2021-12-01 ENCOUNTER — Encounter (HOSPITAL_BASED_OUTPATIENT_CLINIC_OR_DEPARTMENT_OTHER): Payer: Commercial Managed Care - HMO

## 2021-12-02 ENCOUNTER — Ambulatory Visit (INDEPENDENT_AMBULATORY_CARE_PROVIDER_SITE_OTHER): Payer: Commercial Managed Care - HMO

## 2021-12-02 DIAGNOSIS — R0602 Shortness of breath: Secondary | ICD-10-CM | POA: Diagnosis not present

## 2021-12-02 DIAGNOSIS — I701 Atherosclerosis of renal artery: Secondary | ICD-10-CM | POA: Diagnosis not present

## 2021-12-02 DIAGNOSIS — I10 Essential (primary) hypertension: Secondary | ICD-10-CM | POA: Diagnosis not present

## 2021-12-02 LAB — LIPID PANEL
Chol/HDL Ratio: 2.5 ratio (ref 0.0–4.4)
Cholesterol, Total: 204 mg/dL — ABNORMAL HIGH (ref 100–199)
HDL: 81 mg/dL (ref >39)
LDL Chol Calc (NIH): 109 mg/dL — ABNORMAL HIGH (ref 0–99)
Triglycerides: 78 mg/dL (ref 0–149)
VLDL Cholesterol Cal: 14 mg/dL (ref 5–40)

## 2021-12-02 LAB — ECHOCARDIOGRAM COMPLETE
Area-P 1/2: 3.72 cm2
S' Lateral: 2.45 cm

## 2021-12-02 LAB — COMPREHENSIVE METABOLIC PANEL
ALT: 10 IU/L (ref 0–32)
AST: 15 IU/L (ref 0–40)
Albumin/Globulin Ratio: 1.3 (ref 1.2–2.2)
Albumin: 4.3 g/dL (ref 3.8–4.9)
Alkaline Phosphatase: 83 IU/L (ref 44–121)
BUN/Creatinine Ratio: 26 — ABNORMAL HIGH (ref 9–23)
BUN: 35 mg/dL — ABNORMAL HIGH (ref 6–24)
Bilirubin Total: 0.2 mg/dL (ref 0.0–1.2)
CO2: 23 mmol/L (ref 20–29)
Calcium: 10.4 mg/dL — ABNORMAL HIGH (ref 8.7–10.2)
Chloride: 96 mmol/L (ref 96–106)
Creatinine, Ser: 1.33 mg/dL — ABNORMAL HIGH (ref 0.57–1.00)
Globulin, Total: 3.3 g/dL (ref 1.5–4.5)
Glucose: 42 mg/dL — ABNORMAL LOW (ref 70–99)
Potassium: 4.6 mmol/L (ref 3.5–5.2)
Sodium: 138 mmol/L (ref 134–144)
Total Protein: 7.6 g/dL (ref 6.0–8.5)
eGFR: 47 mL/min/{1.73_m2} — ABNORMAL LOW (ref >59)

## 2021-12-03 ENCOUNTER — Telehealth (HOSPITAL_BASED_OUTPATIENT_CLINIC_OR_DEPARTMENT_OTHER): Payer: Self-pay | Admitting: *Deleted

## 2021-12-03 NOTE — Telephone Encounter (Signed)
-----   Message from Skeet Latch, MD sent at 12/03/2021 11:44 AM EST ----- Echo shows that her heart is squeezing well and relaxing normally.  Overall normal echo.

## 2021-12-03 NOTE — Telephone Encounter (Signed)
-----   Message from Skeet Latch, MD sent at 12/03/2021 11:44 AM EST ----- Liver function is stable but her kidney function is worse.  This might be because of adding the diuretic.  Recommend switching back to just valsartan 320 mg daily.  Add hydralazine 50 mg twice a day.

## 2021-12-03 NOTE — Telephone Encounter (Signed)
Left message to call back  

## 2021-12-03 NOTE — Telephone Encounter (Signed)
-----   Message from Skeet Latch, MD sent at 12/03/2021 11:45 AM EST ----- Mild blockage in the artery to the left kidney and normal blood flow on the right.  Repeat in 1 year.

## 2021-12-05 NOTE — Telephone Encounter (Signed)
Results mailed to patient, as not viewed in mychart and has not returned call to office

## 2021-12-18 ENCOUNTER — Encounter (HOSPITAL_BASED_OUTPATIENT_CLINIC_OR_DEPARTMENT_OTHER): Payer: Self-pay | Admitting: Family

## 2021-12-18 ENCOUNTER — Ambulatory Visit (HOSPITAL_BASED_OUTPATIENT_CLINIC_OR_DEPARTMENT_OTHER): Payer: Commercial Managed Care - HMO | Admitting: Family

## 2021-12-18 VITALS — BP 104/66 | HR 106 | Ht 63.0 in | Wt 132.0 lb

## 2021-12-18 DIAGNOSIS — Z720 Tobacco use: Secondary | ICD-10-CM

## 2021-12-18 DIAGNOSIS — I25118 Atherosclerotic heart disease of native coronary artery with other forms of angina pectoris: Secondary | ICD-10-CM

## 2021-12-18 DIAGNOSIS — E785 Hyperlipidemia, unspecified: Secondary | ICD-10-CM

## 2021-12-18 DIAGNOSIS — I10 Essential (primary) hypertension: Secondary | ICD-10-CM | POA: Diagnosis not present

## 2021-12-18 DIAGNOSIS — I701 Atherosclerosis of renal artery: Secondary | ICD-10-CM

## 2021-12-18 NOTE — Progress Notes (Signed)
Office Visit    Patient Name: Stacy [name] Date of Encounter: 12/18/2021  PCP:  [name], MD   [place]  Cardiologist:  Skeet Latch, MD  Advanced Practice Provider:  No care team member to display Electrophysiologist:  None      Chief Complaint    Stacy [name] is a 56 y.o. female presents today for follow up after echocardiogram.   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 Posey Pronto 02/27/2014   Tobacco abuse    Tubular adenoma of colon    Past Surgical History:  Procedure Laterality Date   ANAL FISTULECTOMY  01/19/1985   BREAST EXCISIONAL BIOPSY Left    BREAST LUMPECTOMY Left 1988   PAROTIDECTOMY  03/30/2011   Procedure: PAROTIDECTOMY;  Surgeon: Izora Gala, MD;  Location: [facility];  Service: ENT;  Laterality: Right;   WRIST GANGLION EXCISION Left 1988    Allergies  Allergies  Allergen Reactions   Almond Meal Anaphylaxis and Swelling   Other Anaphylaxis    Almond Butter   Aspirin Nausea Only   History of Present Illness    [name] is a 56 y.o. female with a hx of mild CAD, HTN, HLD, fatty liver disease, elevated LFTs, tobacco use last seen 11/13/21.  Seen by Dr.  Coralyn Mark and referred for CT for unintentional weight loss, tobacco use. CT noted mild to moderate calcified plaque in thoracic aorta and LAD coronary artery calcification. She was referred to cardiology. Coronary CTA 03/29/19 with mild plaque in LAD and minimal plaque in LCx. Atorvastatin previously increased however had elevated LFTs which normalized and she was increased ot '40mg'$ .   HCTZ previously transitioned to Amlodipine due to hypokalemia. However, given elevated BP Valsartan-HCTZ initiated and Amlodipine reduced with improvement in LE edema. Renal artery dopplers with mild to moderate left renal artery stenosis. Labs negative for hyperaldosteronism.   When last seen 11/13/21 repeat lipid ordered with LDL 109. Updated renal artery doppler 11/2021 with stable 1-69% left renal artery stenosis. Echo ordered due to dyspnea with normal LVEF, no significant valvular abnormalities.   She presents today for follow up independently. She reports feeling a bit dizzy and unsteady in the morning when she wakes up. Describes as a lightheaded feeling. It is worse after she takes her medications. Taking all of her antihypertensives in the morning. Not checking BP routinely at home.  Notes not much of an appetite but doesn't always eat with her medications.She feels she is losing weight and plans to  discuss with PCP at upcoming follow up. She worries about being dehydrated.   EKGs/Labs/Other Studies Reviewed:   The following studies were reviewed today:  Echo 11/2021  1. Left ventricular ejection fraction, by estimation, is 60 to 65%. The  left ventricle has normal function. The left ventricle has no regional  wall motion abnormalities. There is mild concentric left ventricular  hypertrophy. Left ventricular diastolic  parameters were normal.   2. Right ventricular systolic function is normal. The right ventricular  size is normal.   3. The mitral valve is normal in structure. No evidence of mitral valve   regurgitation. No evidence of mitral stenosis.   4. The aortic valve is normal in structure. Aortic valve regurgitation is  not visualized. No aortic stenosis is present.   5. The inferior vena cava is normal in size with greater than 50%  respiratory variability, suggesting right atrial pressure of 3 mmHg.   Renal duplex 11/2021  Right: Normal right Resisitive Index. Normal size right kidney. RRV         flow present. No evidence of right renal artery stenosis.         Normal cortical thickness of right kidney.  Left:  Normal left Resistive Index. Normal size of left kidney. LRV         flow present. 1-59% stenosis of the left renal artery. Normal         cortical thickness of the left kidney.  Mesenteric:  Normal Superior Mesenteric artery and Celiac artery findings.    EKG:  EKG is not ordered today.  Recent Labs: 12/22/2020: Magnesium 2.2 04/07/2021: TSH 0.690 08/14/2021: Hemoglobin 10.7; Platelets 523 12/02/2021: ALT 10; BUN 35; Creatinine, Ser 1.33; Potassium 4.6; Sodium 138  Recent Lipid Panel    Component Value Date/Time   CHOL 204 (H) 12/02/2021 1033   TRIG 78 12/02/2021 1033   HDL 81 12/02/2021 1033   CHOLHDL 2.5 12/02/2021 1033   CHOLHDL 3 01/10/2019 0918   VLDL 28.8 01/10/2019 0918   LDLCALC 109 (H) 12/02/2021 1033    Home Medications   Current Meds  Medication Sig   albuterol (VENTOLIN HFA) 108 (90 Base) MCG/ACT inhaler Inhale 2 puffs into the lungs every 4 (four) hours as needed for wheezing or shortness of breath.   allopurinol (ZYLOPRIM) 300 MG tablet Take 1 tablet (300 mg total) by mouth daily.   amLODipine (NORVASC) 5 MG tablet Take 1 tablet (5 mg total) by mouth daily.   colchicine 0.6 MG tablet Take 1 tablet (0.6 mg total) by mouth 2 (two) times daily as needed (gout flare).   EPINEPHrine 0.3 mg/0.3 mL IJ SOAJ injection Insert into muscle once with any facial or neck swelling  Instruct in use   escitalopram (LEXAPRO) 10 MG tablet Take 1 tablet (10 mg  total) by mouth daily.   folic acid (FOLVITE) 1 MG tablet Take 1 tablet (1 mg total) by mouth daily.   glimepiride (AMARYL) 1 MG tablet TAKE 1 TABLET BY MOUTH DAILY BEFORE SUPPER.   glucose blood (FREESTYLE PRECISION NEO TEST) test strip Use as instructed   hydrOXYzine (VISTARIL) 25 MG capsule SMARTSIG:1 Capsule(s) By Mouth PRN   Lancets (FREESTYLE) lancets Use as instructed   metFORMIN (GLUCOPHAGE-XR) 500 MG 24 hr tablet Take 2 tablets (1,000 mg total) by mouth daily with supper.   Multiple Vitamins-Minerals (ONE-A-DAY WOMENS VITACRAVES) CHEW Chew 1 each by mouth daily.   nicotine (NICOTINE STEP 1) 21 mg/24hr patch Place 1 patch (21 mg total) onto  the skin daily.   nicotine polacrilex (NICOTINE MINI) 2 MG lozenge Use three times a day as needed for smoking cessation   pioglitazone (ACTOS) 15 MG tablet Take 1 tablet (15 mg total) by mouth daily.   traZODone (DESYREL) 100 MG tablet TAKE 1 TO 2 TABLETS BY MOUTH AS NEEDED FOR SLEEP   valsartan-hydrochlorothiazide (DIOVAN-HCT) 320-12.5 MG tablet Take 1 tablet by mouth daily.     Review of Systems      All other systems reviewed and are otherwise negative except as noted above.  Physical Exam    VS:  BP 104/66   Pulse (!) 106   Ht '5\' 3"'$  (1.6 m)   Wt 132 lb (59.9 kg)   BMI 23.38 kg/m  , BMI Body mass index is 23.38 kg/m.  Wt Readings from Last 3 Encounters:  12/18/21 132 lb (59.9 kg)  11/13/21 133 lb 12.8 oz (60.7 kg)  08/14/21 127 lb 6.4 oz (57.8 kg)    GEN: Well nourished, well developed, in no acute distress. HEENT: normal. Neck: Supple, no JVD, carotid bruits, or masses. Cardiac: RRR, no murmurs, rubs, or gallops. No clubbing, cyanosis, edema.  Radials/PT 2+ and equal bilaterally.  Respiratory:  Respirations regular and unlabored, clear to auscultation bilaterally. GI: Soft, nontender, nondistended. MS: No deformity or atrophy. Skin: Warm and dry, no rash. Neuro:  Strength and sensation are intact. Psych: Normal  affect.  Assessment & Plan    CAD / HLD- Stable with no anginal symptoms. No indication for ischemic evaluation.  GDMT Atorvastatin. Heart healthy diet and regular cardiovascular exercise encouraged.  LDL not at goal <70. She is unsure what dose of Atorvastatin she is taking and will contact us. Pending response, could consider increased dose vs addition of Zetia.   Dyspnea - Echo 12/02/21 normal LVEF, no significant valvular abnormalities. Reassurance provided. Likely etiology deconditioning.   Renal artery stenosis - 11/2021 L renal artery 1-59% stenosis, stable compared to previous. Repeat in 1 year for monitoring.   HTN - BP well controlled. Relatively hypotensive in clinic with morning lightheadedness. Move Amlodipine to evening.. Labs 12/02/21 with creatinine 1.33 recommended to stop HCTZ and start Hydralazine. She is hesitant regarding medication changes as she just picked up refill. Repeat BMP today. Encouraged to increase fluid intake. If creatinine remains elevated, consider reducing vs discontinuing Spironolactone as she requests to remain on Valsartan-HCTZ.   Tobacco use - Smoking cessation encouraged. Recommend utilization of 1800QUITNOW. Following with care guide. Hesitant about Wellbutrin as mom did not tolerate.          Disposition: Follow up in 1 year(s) with Skeet Latch, MD or APP.  Signed, Loel Dubonnet, NP 12/18/2021, 2:13 PM [place]

## 2021-12-18 NOTE — Patient Instructions (Signed)
Medication Instructions:  Your physician has recommended you make the following change in your medication:  Try moving your Amlodipine to night time!   Please call/ mychart message and let us know what dose of atorvastatin you are taking.   *If you need a refill on your cardiac medications before your next appointment, please call your pharmacy*   Lab Work: Your physician recommends that you return for lab work today- BMP  If you have labs (blood work) drawn today and your tests are completely normal, you will receive your results only by: MyChart Message (if you have MyChart) OR A paper copy in the mail If you have any lab test that is abnormal or we need to change your treatment, we will call you to review the results.  Follow-Up: At [facility], you and your health needs are our priority.  As part of our continuing mission to provide you with exceptional heart care, we have created designated Provider Care Teams.  These Care Teams include your primary Cardiologist (physician) and Advanced Practice Providers (APPs -  Physician Assistants and Nurse Practitioners) who all work together to provide you with the care you need, when you need it.  We recommend signing up for the patient portal called "MyChart".  Sign up information is provided on this After Visit Summary.  MyChart is used to connect with patients for Virtual Visits (Telemedicine).  Patients are able to view lab/test results, encounter notes, upcoming appointments, etc.  Non-urgent messages can be sent to your provider as well.   To learn more about what you can do with MyChart, go to [email or web address].    Your next appointment:   October 2024 with Dr. Oval Linsey

## 2021-12-18 NOTE — Telephone Encounter (Signed)
See updated office note and plan 12/18/21.   Loel Dubonnet, NP

## 2021-12-19 ENCOUNTER — Telehealth (HOSPITAL_BASED_OUTPATIENT_CLINIC_OR_DEPARTMENT_OTHER): Payer: Self-pay

## 2021-12-19 ENCOUNTER — Emergency Department (HOSPITAL_BASED_OUTPATIENT_CLINIC_OR_DEPARTMENT_OTHER): Payer: Commercial Managed Care - HMO

## 2021-12-19 ENCOUNTER — Other Ambulatory Visit: Payer: Self-pay

## 2021-12-19 ENCOUNTER — Encounter (HOSPITAL_BASED_OUTPATIENT_CLINIC_OR_DEPARTMENT_OTHER): Payer: Self-pay | Admitting: Emergency Medicine

## 2021-12-19 ENCOUNTER — Inpatient Hospital Stay (HOSPITAL_BASED_OUTPATIENT_CLINIC_OR_DEPARTMENT_OTHER)
Admission: EM | Admit: 2021-12-19 | Discharge: 2021-12-24 | DRG: 638 | Disposition: A | Discharge status: Home / Self Care | Payer: Commercial Managed Care - HMO | Attending: Internal Medicine | Admitting: Internal Medicine

## 2021-12-19 ENCOUNTER — Encounter (HOSPITAL_COMMUNITY): Payer: Self-pay

## 2021-12-19 DIAGNOSIS — E162 Hypoglycemia, unspecified: Secondary | ICD-10-CM

## 2021-12-19 DIAGNOSIS — N179 Acute kidney failure, unspecified: Secondary | ICD-10-CM | POA: Diagnosis present

## 2021-12-19 DIAGNOSIS — Z6823 Body mass index (BMI) 23.0-23.9, adult: Secondary | ICD-10-CM

## 2021-12-19 DIAGNOSIS — E872 Acidosis, unspecified: Secondary | ICD-10-CM | POA: Diagnosis present

## 2021-12-19 DIAGNOSIS — K222 Esophageal obstruction: Secondary | ICD-10-CM | POA: Diagnosis present

## 2021-12-19 DIAGNOSIS — E11649 Type 2 diabetes mellitus with hypoglycemia without coma: Secondary | ICD-10-CM | POA: Diagnosis not present

## 2021-12-19 DIAGNOSIS — I2510 Atherosclerotic heart disease of native coronary artery without angina pectoris: Secondary | ICD-10-CM | POA: Diagnosis present

## 2021-12-19 DIAGNOSIS — E785 Hyperlipidemia, unspecified: Secondary | ICD-10-CM | POA: Diagnosis present

## 2021-12-19 DIAGNOSIS — K449 Diaphragmatic hernia without obstruction or gangrene: Secondary | ICD-10-CM | POA: Diagnosis present

## 2021-12-19 DIAGNOSIS — E871 Hypo-osmolality and hyponatremia: Secondary | ICD-10-CM | POA: Diagnosis present

## 2021-12-19 DIAGNOSIS — F4321 Adjustment disorder with depressed mood: Secondary | ICD-10-CM | POA: Diagnosis present

## 2021-12-19 DIAGNOSIS — F17210 Nicotine dependence, cigarettes, uncomplicated: Secondary | ICD-10-CM | POA: Diagnosis present

## 2021-12-19 DIAGNOSIS — K219 Gastro-esophageal reflux disease without esophagitis: Secondary | ICD-10-CM | POA: Diagnosis not present

## 2021-12-19 DIAGNOSIS — Z86718 Personal history of other venous thrombosis and embolism: Secondary | ICD-10-CM

## 2021-12-19 DIAGNOSIS — K5730 Diverticulosis of large intestine without perforation or abscess without bleeding: Secondary | ICD-10-CM | POA: Diagnosis present

## 2021-12-19 DIAGNOSIS — Z79899 Other long term (current) drug therapy: Secondary | ICD-10-CM

## 2021-12-19 DIAGNOSIS — R634 Abnormal weight loss: Secondary | ICD-10-CM | POA: Diagnosis present

## 2021-12-19 DIAGNOSIS — R1314 Dysphagia, pharyngoesophageal phase: Secondary | ICD-10-CM | POA: Diagnosis present

## 2021-12-19 DIAGNOSIS — Z8249 Family history of ischemic heart disease and other diseases of the circulatory system: Secondary | ICD-10-CM

## 2021-12-19 DIAGNOSIS — I701 Atherosclerosis of renal artery: Secondary | ICD-10-CM | POA: Diagnosis present

## 2021-12-19 DIAGNOSIS — Z833 Family history of diabetes mellitus: Secondary | ICD-10-CM

## 2021-12-19 DIAGNOSIS — M109 Gout, unspecified: Secondary | ICD-10-CM | POA: Diagnosis present

## 2021-12-19 DIAGNOSIS — F32A Depression, unspecified: Secondary | ICD-10-CM | POA: Diagnosis present

## 2021-12-19 DIAGNOSIS — F419 Anxiety disorder, unspecified: Secondary | ICD-10-CM | POA: Diagnosis present

## 2021-12-19 DIAGNOSIS — Z7901 Long term (current) use of anticoagulants: Secondary | ICD-10-CM

## 2021-12-19 DIAGNOSIS — Z886 Allergy status to analgesic agent status: Secondary | ICD-10-CM

## 2021-12-19 DIAGNOSIS — R1319 Other dysphagia: Secondary | ICD-10-CM

## 2021-12-19 DIAGNOSIS — E8809 Other disorders of plasma-protein metabolism, not elsewhere classified: Secondary | ICD-10-CM | POA: Diagnosis present

## 2021-12-19 DIAGNOSIS — Z7984 Long term (current) use of oral hypoglycemic drugs: Secondary | ICD-10-CM

## 2021-12-19 DIAGNOSIS — E079 Disorder of thyroid, unspecified: Secondary | ICD-10-CM | POA: Diagnosis present

## 2021-12-19 DIAGNOSIS — Z91018 Allergy to other foods: Secondary | ICD-10-CM

## 2021-12-19 DIAGNOSIS — D509 Iron deficiency anemia, unspecified: Secondary | ICD-10-CM | POA: Diagnosis present

## 2021-12-19 DIAGNOSIS — I10 Essential (primary) hypertension: Secondary | ICD-10-CM | POA: Diagnosis present

## 2021-12-19 DIAGNOSIS — E1169 Type 2 diabetes mellitus with other specified complication: Secondary | ICD-10-CM

## 2021-12-19 HISTORY — DX: Hypoglycemia, unspecified: E16.2

## 2021-12-19 LAB — BASIC METABOLIC PANEL
Anion gap: 12 (ref 5–15)
BUN/Creatinine Ratio: 19 (ref 9–23)
BUN: 59 mg/dL — ABNORMAL HIGH (ref 6–24)
BUN: 84 mg/dL — ABNORMAL HIGH (ref 6–20)
CO2: 20 mmol/L (ref 20–29)
CO2: 22 mmol/L (ref 22–32)
Calcium: 10.9 mg/dL — ABNORMAL HIGH (ref 8.7–10.2)
Calcium: 10.4 mg/dL — ABNORMAL HIGH (ref 8.9–10.3)
Chloride: 96 mmol/L (ref 96–106)
Chloride: 99 mmol/L (ref 98–111)
Creatinine, Ser: 3.15 mg/dL — ABNORMAL HIGH (ref 0.57–1.00)
Creatinine, Ser: 3.4 mg/dL — ABNORMAL HIGH (ref 0.44–1.00)
GFR, Estimated: 15 mL/min — ABNORMAL LOW (ref >60)
Glucose, Bld: 30 mg/dL — CL (ref 70–99)
Glucose: 50 mg/dL — ABNORMAL LOW (ref 70–99)
Potassium: 5.6 mmol/L — ABNORMAL HIGH (ref 3.5–5.2)
Potassium: 4.8 mmol/L (ref 3.5–5.1)
Sodium: 136 mmol/L (ref 134–144)
Sodium: 133 mmol/L — ABNORMAL LOW (ref 135–145)
eGFR: 17 mL/min/{1.73_m2} — ABNORMAL LOW (ref >59)

## 2021-12-19 LAB — CBC
HCT: 31.1 % — ABNORMAL LOW (ref 36.0–46.0)
Hemoglobin: 9.7 g/dL — ABNORMAL LOW (ref 12.0–15.0)
MCH: 22.6 pg — ABNORMAL LOW (ref 26.0–34.0)
MCHC: 31.2 g/dL (ref 30.0–36.0)
MCV: 72.3 fL — ABNORMAL LOW (ref 80.0–100.0)
Platelets: 365 10*3/uL (ref 150–400)
RBC: 4.3 MIL/uL (ref 3.87–5.11)
RDW: 22.1 % — ABNORMAL HIGH (ref 11.5–15.5)
WBC: 10.4 10*3/uL (ref 4.0–10.5)
nRBC: 0 % (ref 0.0–0.2)

## 2021-12-19 LAB — CBG MONITORING, ED
Glucose-Capillary: 18 mg/dL — CL (ref 70–99)
Glucose-Capillary: 186 mg/dL — ABNORMAL HIGH (ref 70–99)
Glucose-Capillary: 145 mg/dL — ABNORMAL HIGH (ref 70–99)
Glucose-Capillary: 128 mg/dL — ABNORMAL HIGH (ref 70–99)
Glucose-Capillary: 41 mg/dL — CL (ref 70–99)
Glucose-Capillary: 107 mg/dL — ABNORMAL HIGH (ref 70–99)

## 2021-12-19 LAB — GLUCOSE, CAPILLARY
Glucose-Capillary: 126 mg/dL — ABNORMAL HIGH (ref 70–99)
Glucose-Capillary: 192 mg/dL — ABNORMAL HIGH (ref 70–99)
Glucose-Capillary: 50 mg/dL — ABNORMAL LOW (ref 70–99)
Glucose-Capillary: 85 mg/dL (ref 70–99)

## 2021-12-19 LAB — APTT: aPTT: 30 seconds (ref 24–36)

## 2021-12-19 LAB — HEPARIN LEVEL (UNFRACTIONATED): Heparin Unfractionated: >1.1 IU/mL — ABNORMAL HIGH (ref 0.30–0.70)

## 2021-12-19 LAB — MRSA NEXT GEN BY PCR, NASAL: MRSA by PCR Next Gen: NOT DETECTED

## 2021-12-19 MED ORDER — LACTATED RINGERS IV BOLUS
1000.0000 mL | Freq: Once | INTRAVENOUS | Status: AC
  Administered 2021-12-19: 1000 mL via INTRAVENOUS

## 2021-12-19 MED ORDER — SODIUM CHLORIDE 0.9 % IV SOLN: 250.0000 mL | INTRAVENOUS | Status: DC | PRN

## 2021-12-19 MED ORDER — ESCITALOPRAM OXALATE 10 MG PO TABS
10.0000 mg | ORAL_TABLET | Freq: Every day | ORAL | Status: DC
  Administered 2021-12-20 – 2021-12-24 (×5): 10 mg via ORAL
  Filled 2021-12-19 (×7): qty 1

## 2021-12-19 MED ORDER — ALLOPURINOL 100 MG PO TABS: 300.0000 mg | ORAL_TABLET | Freq: Every day | ORAL | Status: DC

## 2021-12-19 MED ORDER — PANTOPRAZOLE SODIUM 40 MG PO TBEC
40.0000 mg | DELAYED_RELEASE_TABLET | Freq: Once | ORAL | Status: AC
  Administered 2021-12-19: 40 mg via ORAL
  Filled 2021-12-19: qty 1

## 2021-12-19 MED ORDER — TRAZODONE HCL 50 MG PO TABS
200.0000 mg | ORAL_TABLET | Freq: Every day | ORAL | Status: DC
  Administered 2021-12-19 – 2021-12-23 (×5): 200 mg via ORAL
  Filled 2021-12-19 (×5): qty 4

## 2021-12-19 MED ORDER — SODIUM CHLORIDE 0.9% FLUSH
3.0000 mL | Freq: Two times a day (BID) | INTRAVENOUS | Status: DC
  Administered 2021-12-19 – 2021-12-23 (×6): 3 mL via INTRAVENOUS

## 2021-12-19 MED ORDER — DEXTROSE 50 % IV SOLN
50.0000 mL | Freq: Once | INTRAVENOUS | Status: AC
  Administered 2021-12-19: 50 mL via INTRAVENOUS

## 2021-12-19 MED ORDER — SODIUM CHLORIDE 0.9% FLUSH: 3.0000 mL | INTRAVENOUS | Status: DC | PRN

## 2021-12-19 MED ORDER — HEPARIN (PORCINE) 25000 UT/250ML-% IV SOLN
950.0000 [IU]/h | INTRAVENOUS | Status: DC
  Administered 2021-12-19: 950 [IU]/h via INTRAVENOUS
  Filled 2021-12-19 (×2): qty 250

## 2021-12-19 MED ORDER — CHLORHEXIDINE GLUCONATE CLOTH 2 % EX PADS
6.0000 | MEDICATED_PAD | Freq: Every day | CUTANEOUS | Status: DC
  Administered 2021-12-19 – 2021-12-23 (×4): 6 via TOPICAL

## 2021-12-19 MED ORDER — ALUM & MAG HYDROXIDE-SIMETH 200-200-20 MG/5ML PO SUSP
30.0000 mL | Freq: Four times a day (QID) | ORAL | Status: DC | PRN
  Administered 2021-12-19 – 2021-12-21 (×2): 30 mL via ORAL
  Filled 2021-12-19 (×2): qty 30

## 2021-12-19 MED ORDER — AMLODIPINE BESYLATE 10 MG PO TABS
10.0000 mg | ORAL_TABLET | Freq: Every day | ORAL | Status: DC
  Administered 2021-12-20 – 2021-12-24 (×5): 10 mg via ORAL
  Filled 2021-12-19 (×5): qty 1

## 2021-12-19 MED ORDER — RIVAROXABAN 15 MG PO TABS: 15.0000 mg | ORAL_TABLET | Freq: Every evening | ORAL | Status: DC

## 2021-12-19 MED ORDER — LACTATED RINGERS IV SOLN: INTRAVENOUS | Status: DC

## 2021-12-19 MED ORDER — LORAZEPAM 1 MG PO TABS
1.0000 mg | ORAL_TABLET | Freq: Once | ORAL | Status: AC
  Administered 2021-12-19: 1 mg via ORAL
  Filled 2021-12-19: qty 1

## 2021-12-19 MED ORDER — DEXTROSE 50 % IV SOLN
INTRAVENOUS | Status: AC
  Filled 2021-12-19: qty 50

## 2021-12-19 MED ORDER — CALCIUM CARBONATE ANTACID 500 MG PO CHEW
1.0000 | CHEWABLE_TABLET | Freq: Once | ORAL | Status: AC
  Administered 2021-12-19: 200 mg via ORAL
  Filled 2021-12-19: qty 1

## 2021-12-19 MED ORDER — DEXTROSE 10 % IV SOLN: INTRAVENOUS | Status: DC

## 2021-12-19 NOTE — ED Triage Notes (Signed)
Pt reports having labs drawn yesterday and sent for abnormal labs, possibly dehydration. Pt endorses dizziness and fatigue for a month.

## 2021-12-19 NOTE — H&P (Signed)
History and Physical    Patient: Stacy [name] QAS:[id number] DOB: August 15, 1965 DOA: 12/19/2021 DOS: the patient was seen and examined on 12/19/2021 PCP: [name], MD  Patient coming from:  [facility] [facility]  Chief Complaint:  Chief Complaint  Patient presents with   Fatigue   HPI: Stacy [name] is a 56 y.o. female with medical history significant of CAD, hypertension, hyperlipidemia, renal artery stenosis, ongoing tobacco use, DVT who presents following abnormal labs.  She has been having low appetite for at least a year. For the past month has been feeling dizzy and unsteady intermittently when she first gets up out of bed. She went to get lab done outpatient and was found to have acute renal failure and advised to present to ED. In ED, glucose was down to 18.  She was started on a new ACE-HCTZ combo for the past week and a half. Has occasionally been taking Advil for headache. She also takes metformin, Actos, and glimepiride for diabetes. Not on insulin.  In the ED, temperature of 97.8 F, normotensive on room air.  She was hypoglycemic down to the 30s with acute renal failure with creatinine up to 3.40 up from a prior of 0.63.  Sodium of 133, K of 4.8.  WBC of 10.4, hemoglobin around baseline at 9.7. Review of Systems: As mentioned in the history of present illness. All other systems reviewed and are negative. 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 Posey Pronto 02/27/2014   Tobacco abuse    Tubular adenoma of colon    Past Surgical History:  Procedure Laterality Date   ANAL FISTULECTOMY  01/19/1985   BREAST EXCISIONAL BIOPSY Left    BREAST LUMPECTOMY Left 1988   PAROTIDECTOMY  03/30/2011   Procedure: PAROTIDECTOMY;  Surgeon: Izora Gala, MD;  Location: [facility];  Service: ENT;  Laterality: Right;   WRIST GANGLION EXCISION Left 1988   Social History:  reports that she has been smoking cigarettes. She has a 28.00 pack-year smoking history. She has never used smokeless tobacco. She reports current alcohol use of about 14.0 standard drinks of alcohol per week. She reports that she does not use drugs.  Allergies  Allergen Reactions   Almond Meal Anaphylaxis and Swelling   Other Anaphylaxis    Almond Butter   Aspirin Nausea Only    Other Reaction(s): Not available, Unknown    Family History  Problem Relation Age of Onset   Coronary artery disease Father 25       CAD,CABG   Hypertension Mother    Stroke Mother  Diabetes Sister        uncontrolled   Cancer Sister 63       uterine cancer   Aneurysm Sister        brain   Heart attack Maternal Grandmother    Blindness Paternal Grandmother    Breast cancer Neg Hx    Colon cancer Neg Hx    Esophageal cancer Neg Hx    Rectal cancer Neg Hx    Stomach cancer Neg Hx     Prior to Admission medications   Medication Sig Start Date End Date Taking? Authorizing Provider  albuterol (VENTOLIN HFA) 108 (90 Base) MCG/ACT inhaler Inhale 2 puffs into the lungs every 4 (four) hours as needed for wheezing or shortness of breath. 04/07/21   [name], MD  allopurinol (ZYLOPRIM) 300 MG tablet Take 1 tablet (300 mg total) by mouth daily. 06/12/21   [name], MD  amLODipine (NORVASC) 10 MG  tablet Take 10 mg by mouth daily. 12/01/21   [provider]  amLODipine (NORVASC) 5 MG tablet Take 1 tablet (5 mg total) by mouth daily. 08/22/21   [name], MD  colchicine 0.6 MG tablet Take 1 tablet (0.6 mg total) by mouth 2 (two) times daily as needed (gout flare). 10/28/21   [name], MD  EPINEPHrine 0.3 mg/0.3 mL IJ SOAJ injection Insert into muscle once with any facial or neck swelling  Instruct in use 01/25/17   [place], Modena Nunnery, MD  escitalopram (LEXAPRO) 10 MG tablet Take 1 tablet (10 mg total) by mouth daily. 08/14/21   [name], MD  folic acid (FOLVITE) 1 MG tablet Take 1 tablet (1 mg total) by mouth daily. 06/12/21   [name], MD  glimepiride (AMARYL) 1 MG tablet TAKE 1 TABLET BY MOUTH DAILY BEFORE SUPPER. 08/14/21   [name], MD  glucose blood (FREESTYLE PRECISION NEO TEST) test strip Use as instructed 02/04/21   [name], MD  hydrOXYzine (VISTARIL) 25 MG capsule SMARTSIG:1 Capsule(s) By Mouth PRN 03/10/21   [provider]  Lancets (FREESTYLE) lancets Use as instructed 02/04/21   [name], MD  metFORMIN (GLUCOPHAGE-XR) 500 MG 24 hr tablet Take 2 tablets (1,000 mg total) by mouth daily with supper. 08/14/21   [name], MD  Multiple Vitamins-Minerals (ONE-A-DAY WOMENS VITACRAVES) CHEW Chew 1 each by mouth daily.    [provider]  nicotine (NICOTINE STEP 1) 21 mg/24hr patch Place 1 patch (21 mg total) onto the skin daily. 02/04/21   [name], MD  nicotine polacrilex (NICOTINE MINI) 2 MG lozenge Use three times a day as needed for smoking cessation 02/04/21   [name], MD  pioglitazone (ACTOS) 15 MG tablet Take 1 tablet (15 mg total) by mouth daily. 04/07/21   [name], MD  spironolactone (ALDACTONE) 25 MG tablet Take 1 tablet (25 mg total) by mouth daily. 06/12/21 09/10/21  [name], MD  traZODone (DESYREL) 100 MG tablet TAKE 1 TO 2 TABLETS BY MOUTH AS NEEDED FOR SLEEP  08/13/21   [name], MD  valsartan-hydrochlorothiazide (DIOVAN-HCT) 320-12.5 MG tablet Take 1 tablet by mouth daily. 11/13/21   Skeet Latch, MD  XARELTO 20 MG TABS tablet Take 20 mg by mouth every evening. 11/25/21   [provider]  Lancets MISC Use as instructed to monitor FSBS 3x daily. Dx: E11.65 01/03/15   Alycia Rossetti, MD    Physical Exam: Vitals:  12/19/21 1300 12/19/21 1530 12/19/21 1648 12/19/21 1900  BP: 127/80 97/60  121/76  Pulse: 81 88  88  Resp: '15 19  17  '$ Temp:   97.9 F (36.6 C) 99.1 F (37.3 C)  TempSrc:   Oral Oral  SpO2: 100% 99%  100%  Weight:    59.1 kg  Height:    '5\' 3"'$  (1.6 m)   Constitutional: NAD, calm, comfortable, well-appearing middle-age female sitting upright in bed Eyes:  lids and conjunctivae normal ENMT: Mucous membranes are moist. Neck: normal, supple Respiratory: clear to auscultation bilaterally, no wheezing, no crackles. Normal respiratory effort. No accessory muscle use.  Cardiovascular: Regular rate and rhythm, no murmurs / rubs / gallops. No extremity edema. Abdomen: no tenderness,  Bowel sounds positive.  Musculoskeletal: no clubbing / cyanosis. No joint deformity upper and lower extremities. Good ROM, no contractures. Normal muscle tone.  Skin: no rashes, lesions, ulcers. No induration Neurologic: CN 2-12 grossly intact. Strength 5/5 in all 4.  Psychiatric: Normal judgment and insight. Alert and oriented x 3. Normal mood. Data Reviewed:  See HPI  Assessment and Plan: * Hypoglycemia -suspect due to poor renal clearance of her antidiabetic medication in the setting of acute renal failure -hold all hold diabetes medication  -continuous IV D10 overnight -follow CBC q4hr    History of DVT (deep vein thrombosis) -Venous doppler on 07/2021 showed DVT of right popliteal vein. -will need to hold Xarelto due to acute renal failure and place on IV heparin infusion  Acute renal failure (HCC) -Creatinine up to 3.4  from 0.63 baseline -suspect a combination of dehydration, being on ACE-diuretic antihypertensive and occasional NSAIDS use. No symptoms concerning for post-obstructive process. Could consider renal ultrasound if renal function not improved with fluids overnight -Has received LR bolus in ED. Currently on D10 IV continuous fluid for her concomitant hypoglycemia.   -Hold nephrotoxic agent -renally dose all medications  GERD (gastroesophageal reflux disease) -PPI and tums x 1   Hypertension Continue amlodipine -hold valsartan-HCTZ due to ARF      Advance Care Planning: Full  Consults: none  Family Communication: none at bedside  Severity of Illness: The appropriate patient status for this patient is OBSERVATION. Observation status is judged to be reasonable and necessary in order to provide the required intensity of service to ensure the patient's safety. The patient's presenting symptoms, physical exam findings, and initial radiographic and laboratory data in the context of their medical condition is felt to place them at decreased risk for further clinical deterioration. Furthermore, it is anticipated that the patient will be medically stable for discharge from the hospital within 2 midnights of admission.   Author: Orene Desanctis, DO 12/19/2021 10:41 PM  For on call review [email or web address].

## 2021-12-19 NOTE — ED Notes (Signed)
Pt is CAOx4. Pt was given microwave dinner, sprite, and vanilla icecream to help bring Pt's blood sugar up. Pt is complaint free at this time and eating without any difficulty.

## 2021-12-19 NOTE — Assessment & Plan Note (Signed)
-  Venous doppler on 07/2021 showed DVT of right popliteal vein. -will need to hold Xarelto due to acute renal failure and place on IV heparin infusion

## 2021-12-19 NOTE — Assessment & Plan Note (Signed)
Continue amlodipine -hold valsartan-HCTZ due to ARF

## 2021-12-19 NOTE — Telephone Encounter (Addendum)
2nd contact attempt to patient, no answer, unable to leave VM due to box being full

## 2021-12-19 NOTE — Progress Notes (Signed)
Plan of Care Note for accepted transfer   Patient: Stacy [name] MRN: [id number]   DOA: 12/19/2021  Facility requesting transfer: [facility]. Requesting Provider: Sherwood Gambler, MD Reason for transfer: Hypoglycemia and AKI. Facility course:  Per Dr. Ardith Dark: None " Chief Complaint  Patient presents with   Fatigue      Stacy [name] is a 56 y.o. female.   HPI 56 year old female with abnormal labs.  She has been feeling decreased appetite for about 6 months.  However over the last week or so she has been feeling tremulous and lightheaded.  Sometimes she is worried she is going to fall over.  For the past couple days she has also been getting night sweats.  She has been taking her medicines as prescribed.  Saw her cardiologist, Dr. Oval Linsey yesterday and had labs drawn.  She was called today and was given a voicemail that indicated that her creatinine and potassium were abnormal.  She was told to come to the ER.  Prior to me seeing her a fingerstick glucose had been done due to shakiness and her glucose was 18."  Lab work:   Component Value Units  CBG monitoring, ED [[id number]] (Abnormal)   Collected: 12/19/21 1307   Updated: 12/19/21 1308    Glucose-Capillary 145 High  mg/dL  Basic metabolic panel [[id number]] (Abnormal)   Collected: 12/19/21 1200   Updated: 12/19/21 1243   Specimen Type: Blood    Sodium 133 Low  mmol/L   Potassium 4.8 mmol/L   Chloride 99 mmol/L   CO2 22 mmol/L   Glucose, Bld 30 Low Panic  mg/dL   BUN 84 High  mg/dL   Creatinine, Ser 3.40 High  mg/dL   Calcium 10.4 High  mg/dL   GFR, Estimated 15 Low  mL/min   Anion gap 12  CBG monitoring, ED (now and then every hour for 3 hours) [[id number]] (Abnormal)   Collected: 12/19/21 1229   Updated: 12/19/21 1231   Specimen Type: Blood    Glucose-Capillary 186 High  mg/dL  CBG monitoring, ED [[id number]] (Abnormal)   Collected: 12/19/21 1205   Updated: 12/19/21 1209   Specimen Type: Blood    Glucose-Capillary 18  Low Panic  mg/dL  CBC [[id number]] (Abnormal)   Collected: 12/19/21 1200   Updated: 12/19/21 1204   Specimen Type: Blood    WBC 10.4 K/uL   RBC 4.30 MIL/uL   Hemoglobin 9.7 Low  g/dL   HCT 31.1 Low  %   MCV 72.3 Low  fL   MCH 22.6 Low  pg   MCHC 31.2 g/dL   RDW 22.1 High  %   Platelets 365 K/uL   nRBC 0.0 %   Imaging: PORTABLE CHEST 1 VIEW   COMPARISON:  Jun 12, 2021   FINDINGS: EKG leads project over the chest.   Cardiomediastinal contours and hilar structures are normal. Moderate to large hiatal hernia potentially larger than on previous imaging projects in the retrocardiac region. No signs of consolidation. No sign of pleural effusion. No pneumothorax.   On limited assessment no acute skeletal process.   IMPRESSION: 1. No acute cardiopulmonary disease. 2. Moderate to large hiatal hernia potentially larger than on previous imaging.     Electronically Signed   By: Zetta Bills M.D.   On: 12/19/2021 12:47  Plan of care: The patient is accepted for admission to [facility] unit, at [facility].  Author: Reubin Milan, MD 12/19/2021  Check [email or web address] for on-call coverage.  Nursing staff, Please call  [facility] Admits & Consults System-Wide number on Amion as soon as patient's arrival, so appropriate admitting provider can evaluate the pt.

## 2021-12-19 NOTE — Assessment & Plan Note (Addendum)
-  Creatinine up to 3.4 from 0.63 baseline -suspect a combination of dehydration, being on ACE-diuretic antihypertensive and occasional NSAIDS use. No symptoms concerning for post-obstructive process. Could consider renal ultrasound if renal function not improved with fluids overnight -Has received LR bolus in ED. Currently on D10 IV continuous fluid for her concomitant hypoglycemia.   -Hold nephrotoxic agent -renally dose all medications

## 2021-12-19 NOTE — Telephone Encounter (Signed)
Seen by patient Stacy [name] on 12/19/2021 11:09 AM, results viewed.

## 2021-12-19 NOTE — Progress Notes (Signed)
ANTICOAGULATION CONSULT NOTE - Initial Consult  Pharmacy Consult for heparin Indication: VTE treatment  Allergies  Allergen Reactions   Almond Meal Anaphylaxis and Swelling   Other Anaphylaxis    Almond Butter   Aspirin Nausea Only    Other Reaction(s): Not available, Unknown    Patient Measurements: Height: '5\' 3"'$  (160 cm) Weight: 59.1 kg (130 lb 4.7 oz) IBW/kg (Calculated) : 52.4 Heparin Dosing Weight: 59.1 kg  Vital Signs: Temp: 99.1 F (37.3 C) (12/01 1900) Temp Source: Oral (12/01 1900) BP: 121/76 (12/01 1900) Pulse Rate: 88 (12/01 1900)  Labs: Recent Labs    12/18/21 1441 12/19/21 1200  HGB  --  9.7*  HCT  --  31.1*  PLT  --  365  CREATININE 3.15* 3.40*    Estimated Creatinine Clearance: 15.1 mL/min (A) (by C-G formula based on SCr of 3.4 mg/dL (H)).   Medical History: Past Medical History:  Diagnosis Date   Acute bronchitis 08/02/2019   Adrenal adenoma    as per MRI 03/09/14   AKI (acute kidney injury) ([place]) 12/20/2020   Allergy    Anxiety    Coronary artery calcification seen on CAT scan 04/12/2018   Depression    Diabetes mellitus    Diverticulosis    Elevated LFTs    elevated since 09/2013   Fatty liver    moderate- as per 03/06/17 MRI liver   GERD (gastroesophageal reflux disease)    Gout    H/O hiatal hernia    Headache(784.0)    History of depression    Hyperlipemia    Hyperplastic colon polyp    Hypertension    Mass of parotid gland 02/26/2011   Positive hepatitis C antibody test    RNA NEGATIVE   Renal artery stenosis ([place]) 08/02/2019   06/2019: 1-59% stenosis on L    Thyroid nodule  managed Dr. Posey Pronto 02/27/2014   Tobacco abuse    Tubular adenoma of colon       Assessment: 56 yo female with abnormal labs,  hx of mild CAD, HTN, HLD, fatty liver disease, elevated LFTs, tobacco use, on xarelto for hx of DVT.  Due to AKI pharmacy consulted to dose heparin drip. LD xarelto 11/30 @ 2100  Hgb 9.7, Plts 365, Scr 3.4 Baseline labs ordered  STAT  Goal of Therapy:  Heparin level 0.3-0.7 units/ml aPTT 66-102 seconds Monitor platelets by anticoagulation protocol: Yes   Plan:  Start heparin drip at 0950 units/hr, no bolus with recent xarelto dose Check aPTT in 8 hours Daily CBC, HL    Dolly Rias RPh 12/19/2021, 9:52 PM

## 2021-12-19 NOTE — ED Provider Notes (Signed)
[name] Provider Note   CSN: [id number] Arrival date & time: 12/19/21  1139     History  Chief Complaint  Patient presents with   Fatigue    [name] is a 56 y.o. female.  HPI 56 year old female with abnormal labs.  She has been feeling decreased appetite for about 6 months.  However over the last week or so she has been feeling tremulous and lightheaded.  Sometimes she is worried she is going to fall over.  For the past couple days she has also been getting night sweats.  She has been taking her medicines as prescribed.  Saw her cardiologist, Dr. Oval Linsey yesterday and had labs drawn.  She was called today and was given a voicemail that indicated that her creatinine and potassium were abnormal.  She was told to come to the ER.  Prior to me seeing her a fingerstick glucose had been done due to shakiness and her glucose was 18.  Home Medications Prior to Admission medications   Medication Sig Start Date End Date Taking? Authorizing Provider  albuterol (VENTOLIN HFA) 108 (90 Base) MCG/ACT inhaler Inhale 2 puffs into the lungs every 4 (four) hours as needed for wheezing or shortness of breath. 04/07/21   [name], MD  allopurinol (ZYLOPRIM) 300 MG tablet Take 1 tablet (300 mg total) by mouth daily. 06/12/21   [name], MD  amLODipine (NORVASC) 10 MG tablet Take 10 mg by mouth daily. 12/01/21   [provider]  amLODipine (NORVASC) 5 MG tablet Take 1 tablet (5 mg total) by mouth daily. 08/22/21   [name], MD  colchicine 0.6 MG tablet Take 1 tablet (0.6 mg total) by mouth 2 (two) times daily as needed (gout flare). 10/28/21   [name], MD  EPINEPHrine 0.3 mg/0.3 mL IJ SOAJ injection Insert into muscle once with any facial or neck swelling  Instruct in use 01/25/17   , Modena Nunnery, MD  escitalopram (LEXAPRO) 10 MG tablet Take 1 tablet (10 mg total) by mouth daily. 08/14/21   [name], MD  folic acid  (FOLVITE) 1 MG tablet Take 1 tablet (1 mg total) by mouth daily. 06/12/21   [name], MD  glimepiride (AMARYL) 1 MG tablet TAKE 1 TABLET BY MOUTH DAILY BEFORE SUPPER. 08/14/21   [name], MD  glucose blood (FREESTYLE PRECISION NEO TEST) test strip Use as instructed 02/04/21   [name], MD  hydrOXYzine (VISTARIL) 25 MG capsule SMARTSIG:1 Capsule(s) By Mouth PRN 03/10/21   [provider]  Lancets (FREESTYLE) lancets Use as instructed 02/04/21   [name], MD  metFORMIN (GLUCOPHAGE-XR) 500 MG 24 hr tablet Take 2 tablets (1,000 mg total) by mouth daily with supper. 08/14/21   [name], MD  Multiple Vitamins-Minerals (ONE-A-DAY WOMENS VITACRAVES) CHEW Chew 1 each by mouth daily.    [provider]  nicotine (NICOTINE STEP 1) 21 mg/24hr patch Place 1 patch (21 mg total) onto the skin daily. 02/04/21   [name], MD  nicotine polacrilex (NICOTINE MINI) 2 MG lozenge Use three times a day as needed for smoking cessation 02/04/21   [name], MD  pioglitazone (ACTOS) 15 MG tablet Take 1 tablet (15 mg total) by mouth daily. 04/07/21   [name], MD  spironolactone (ALDACTONE) 25 MG tablet Take 1 tablet (25 mg total) by mouth daily. 06/12/21 09/10/21  [name], MD  traZODone (DESYREL) 100 MG  tablet TAKE 1 TO 2 TABLETS BY MOUTH AS NEEDED FOR SLEEP 08/13/21   [name], MD  valsartan-hydrochlorothiazide (DIOVAN-HCT) 320-12.5 MG tablet Take 1 tablet by mouth daily. 11/13/21   Skeet Latch, MD  XARELTO 20 MG TABS tablet Take 20 mg by mouth every evening. 11/25/21   [provider]  Lancets MISC Use as instructed to monitor FSBS 3x daily. Dx: E11.65 01/03/15   Alycia Rossetti, MD      Allergies    Almond meal, Other, and Aspirin    Review of Systems   Review of Systems  Respiratory:  Negative for shortness of breath.   Gastrointestinal:  Negative for abdominal pain, diarrhea and vomiting.  Genitourinary:   Negative for dysuria.  Neurological:  Positive for light-headedness. Negative for weakness and headaches.    Physical Exam Updated Vital Signs BP 127/80   Pulse 81   Temp 97.8 F (36.6 C) (Oral)   Resp 15   Ht '5\' 3"'$  (1.6 m)   Wt 59.9 kg   SpO2 100%   BMI 23.38 kg/m  Physical Exam Vitals and nursing note reviewed.  Constitutional:      General: She is not in acute distress.    Appearance: She is well-developed. She is not ill-appearing or diaphoretic.  HENT:     Head: Normocephalic and atraumatic.  Eyes:     Extraocular Movements: Extraocular movements intact.     Pupils: Pupils are equal, round, and reactive to light.  Cardiovascular:     Rate and Rhythm: Normal rate and regular rhythm.     Heart sounds: Normal heart sounds.  Pulmonary:     Effort: Pulmonary effort is normal.     Breath sounds: Normal breath sounds.  Abdominal:     Palpations: Abdomen is soft.     Tenderness: There is no abdominal tenderness.  Skin:    General: Skin is warm and dry.  Neurological:     Mental Status: She is alert.     Comments: CN 3-12 grossly intact. 5/5 strength in all 4 extremities. Grossly normal sensation. Normal finger to nose.      ED Results / Procedures / Treatments   Labs (all labs ordered are listed, but only abnormal results are displayed) Labs Reviewed  BASIC METABOLIC PANEL - Abnormal; Notable for the following components:      Result Value   Sodium 133 (*)    Glucose, Bld 30 (*)    BUN 84 (*)    Creatinine, Ser 3.40 (*)    Calcium 10.4 (*)    GFR, Estimated 15 (*)    All other components within normal limits  CBC - Abnormal; Notable for the following components:   Hemoglobin 9.7 (*)    HCT 31.1 (*)    MCV 72.3 (*)    MCH 22.6 (*)    RDW 22.1 (*)    All other components within normal limits  CBG MONITORING, ED - Abnormal; Notable for the following components:   Glucose-Capillary 18 (*)    All other components within normal limits  CBG MONITORING, ED -  Abnormal; Notable for the following components:   Glucose-Capillary 186 (*)    All other components within normal limits  CBG MONITORING, ED - Abnormal; Notable for the following components:   Glucose-Capillary 145 (*)    All other components within normal limits  CBG MONITORING, ED - Abnormal; Notable for the following components:   Glucose-Capillary 128 (*)    All other components within normal  limits  URINALYSIS, ROUTINE W REFLEX MICROSCOPIC  CBG MONITORING, ED    EKG EKG Interpretation  Date/Time:  Friday December 19 2021 11:53:36 EST Ventricular Rate:  83 PR Interval:  136 QRS Duration: 82 QT Interval:  355 QTC Calculation: 418 R Axis:   70 Text Interpretation: Sinus rhythm Low voltage, precordial leads no acute ST/T changes Confirmed by Sherwood Gambler [phone]) on 12/19/2021 12:29:50 PM  Radiology DG Chest Portable 1 View  Result Date: 12/19/2021 CLINICAL DATA:  Weakness, chronic kidney disease in a 56 year old female. EXAM: PORTABLE CHEST 1 VIEW COMPARISON:  Jun 12, 2021 FINDINGS: EKG leads project over the chest. Cardiomediastinal contours and hilar structures are normal. Moderate to large hiatal hernia potentially larger than on previous imaging projects in the retrocardiac region. No signs of consolidation. No sign of pleural effusion. No pneumothorax. On limited assessment no acute skeletal process. IMPRESSION: 1. No acute cardiopulmonary disease. 2. Moderate to large hiatal hernia potentially larger than on previous imaging. Electronically Signed   By: Zetta Bills M.D.   On: 12/19/2021 12:47    Procedures .Critical Care  Performed by: Sherwood Gambler, MD Authorized by: Sherwood Gambler, MD   Critical care provider statement:    Critical care time (minutes):  35   Critical care time was exclusive of:  Separately billable procedures and treating other patients   Critical care was necessary to treat or prevent imminent or life-threatening deterioration of the following  conditions:  Endocrine crisis and renal failure   Critical care was time spent personally by me on the following activities:  Development of treatment plan with patient or surrogate, discussions with consultants, evaluation of patient's response to treatment, examination of patient, ordering and review of laboratory studies, ordering and review of radiographic studies, ordering and performing treatments and interventions, pulse oximetry, re-evaluation of patient's condition and review of old charts     Medications Ordered in ED Medications  sodium chloride flush (NS) 0.9 % injection 3 mL (has no administration in time range)  sodium chloride flush (NS) 0.9 % injection 3 mL (has no administration in time range)  0.9 %  sodium chloride infusion (has no administration in time range)  lactated ringers infusion ( Intravenous New Bag/Given 12/19/21 1444)  dextrose 50 % solution 50 mL (50 mLs Intravenous Given 12/19/21 1208)  lactated ringers bolus 1,000 mL ( Intravenous Stopped 12/19/21 1348)    ED Course/ Medical Decision Making/ A&P                           Medical Decision Making Amount and/or Complexity of Data Reviewed External Data Reviewed: notes. Labs: ordered.    Details: Severe hypoglycemia at 18.  Acute kidney injury compared to baseline though relatively similar to yesterday.  Potassium is okay at 4.8 Radiology: ordered and independent interpretation performed.    Details: No pneumonia ECG/medicine tests: independent interpretation performed.    Details: No acute ischemia  Risk Prescription drug management. Decision regarding hospitalization.   Patient is completely alert and oriented though has severe hypoglycemia at 18.  Was given D50.  She has been hemodynamically stable.  She was given IV fluids for her acute kidney injury.  I suspect this is a combination of poor p.o. intake as well as multiple antihypertensives.  Her hyperglycemia is probably from not eating well as she  last ate last night as well as being on multiple diabetic meds including Amaryl, Actos, metformin.  After being given D50, she  was given p.o. food and has been keeping up her glucose since then.  She will need admission for the AKI.  No significant electrolyte disturbance.  Discussed with Dr. Olevia Bowens who will admit.        Final Clinical Impression(s) / ED Diagnoses Final diagnoses:  Acute kidney injury ([place])  Hypoglycemia    Rx / DC Orders ED Discharge Orders     None         Sherwood Gambler, MD 12/19/21 1511

## 2021-12-19 NOTE — Progress Notes (Signed)
.  hypoHypoglycemic Event  CBG: 50  Treatment: 8 oz juice/soda  Symptoms: None  Follow-up CBG: Time:2045  CBG Result:85  Possible Reasons for Event: Medication regimen: poor po intake and taking home meds for DM  Comments/MD notified:IV dextrose started    Reynold Bowen

## 2021-12-19 NOTE — Telephone Encounter (Addendum)
Attempted to call patient, no answer, left message and sent patient follow up mychart message.    ----- Message from Loel Dubonnet, NP sent at 12/19/2021 10:52 AM EST ----- Kidney function markedly decreased from previous. Potassium level elevated likely due to kidney function.   Recommend evaluation in the ED for hydration.

## 2021-12-19 NOTE — Assessment & Plan Note (Signed)
-  PPI and tums x 1

## 2021-12-19 NOTE — Assessment & Plan Note (Signed)
-  suspect due to poor renal clearance of her antidiabetic medication in the setting of acute renal failure -hold all hold diabetes medication  -continuous IV D10 overnight -follow CBC q4hr

## 2021-12-19 NOTE — Telephone Encounter (Signed)
Patient currently admitted to the ED, removing from triage pool

## 2021-12-20 DIAGNOSIS — D509 Iron deficiency anemia, unspecified: Secondary | ICD-10-CM | POA: Diagnosis present

## 2021-12-20 DIAGNOSIS — I10 Essential (primary) hypertension: Secondary | ICD-10-CM | POA: Diagnosis present

## 2021-12-20 DIAGNOSIS — E079 Disorder of thyroid, unspecified: Secondary | ICD-10-CM | POA: Diagnosis present

## 2021-12-20 DIAGNOSIS — E162 Hypoglycemia, unspecified: Secondary | ICD-10-CM | POA: Diagnosis present

## 2021-12-20 DIAGNOSIS — E11649 Type 2 diabetes mellitus with hypoglycemia without coma: Secondary | ICD-10-CM | POA: Diagnosis present

## 2021-12-20 DIAGNOSIS — F17210 Nicotine dependence, cigarettes, uncomplicated: Secondary | ICD-10-CM | POA: Diagnosis present

## 2021-12-20 DIAGNOSIS — F32A Depression, unspecified: Secondary | ICD-10-CM | POA: Diagnosis present

## 2021-12-20 DIAGNOSIS — I701 Atherosclerosis of renal artery: Secondary | ICD-10-CM | POA: Diagnosis present

## 2021-12-20 DIAGNOSIS — K222 Esophageal obstruction: Secondary | ICD-10-CM | POA: Diagnosis present

## 2021-12-20 DIAGNOSIS — Z79899 Other long term (current) drug therapy: Secondary | ICD-10-CM | POA: Diagnosis not present

## 2021-12-20 DIAGNOSIS — M109 Gout, unspecified: Secondary | ICD-10-CM | POA: Diagnosis present

## 2021-12-20 DIAGNOSIS — K219 Gastro-esophageal reflux disease without esophagitis: Secondary | ICD-10-CM | POA: Diagnosis present

## 2021-12-20 DIAGNOSIS — E872 Acidosis, unspecified: Secondary | ICD-10-CM | POA: Diagnosis present

## 2021-12-20 DIAGNOSIS — N179 Acute kidney failure, unspecified: Secondary | ICD-10-CM | POA: Diagnosis present

## 2021-12-20 DIAGNOSIS — I2510 Atherosclerotic heart disease of native coronary artery without angina pectoris: Secondary | ICD-10-CM | POA: Diagnosis present

## 2021-12-20 DIAGNOSIS — Z886 Allergy status to analgesic agent status: Secondary | ICD-10-CM | POA: Diagnosis not present

## 2021-12-20 DIAGNOSIS — K5730 Diverticulosis of large intestine without perforation or abscess without bleeding: Secondary | ICD-10-CM | POA: Diagnosis not present

## 2021-12-20 DIAGNOSIS — E785 Hyperlipidemia, unspecified: Secondary | ICD-10-CM | POA: Diagnosis present

## 2021-12-20 DIAGNOSIS — R634 Abnormal weight loss: Secondary | ICD-10-CM | POA: Diagnosis present

## 2021-12-20 DIAGNOSIS — R1310 Dysphagia, unspecified: Secondary | ICD-10-CM | POA: Diagnosis not present

## 2021-12-20 DIAGNOSIS — R630 Anorexia: Secondary | ICD-10-CM | POA: Diagnosis not present

## 2021-12-20 DIAGNOSIS — E871 Hypo-osmolality and hyponatremia: Secondary | ICD-10-CM | POA: Diagnosis present

## 2021-12-20 DIAGNOSIS — Z91018 Allergy to other foods: Secondary | ICD-10-CM | POA: Diagnosis not present

## 2021-12-20 DIAGNOSIS — Z86718 Personal history of other venous thrombosis and embolism: Secondary | ICD-10-CM | POA: Diagnosis not present

## 2021-12-20 DIAGNOSIS — K3189 Other diseases of stomach and duodenum: Secondary | ICD-10-CM | POA: Diagnosis not present

## 2021-12-20 DIAGNOSIS — F419 Anxiety disorder, unspecified: Secondary | ICD-10-CM | POA: Diagnosis present

## 2021-12-20 DIAGNOSIS — E8809 Other disorders of plasma-protein metabolism, not elsewhere classified: Secondary | ICD-10-CM | POA: Diagnosis present

## 2021-12-20 DIAGNOSIS — R1319 Other dysphagia: Secondary | ICD-10-CM | POA: Diagnosis not present

## 2021-12-20 DIAGNOSIS — K449 Diaphragmatic hernia without obstruction or gangrene: Secondary | ICD-10-CM | POA: Diagnosis present

## 2021-12-20 DIAGNOSIS — D508 Other iron deficiency anemias: Secondary | ICD-10-CM | POA: Diagnosis not present

## 2021-12-20 LAB — BASIC METABOLIC PANEL
Anion gap: 7 (ref 5–15)
BUN: 57 mg/dL — ABNORMAL HIGH (ref 6–20)
CO2: 26 mmol/L (ref 22–32)
Calcium: 9.3 mg/dL (ref 8.9–10.3)
Chloride: 101 mmol/L (ref 98–111)
Creatinine, Ser: 1.42 mg/dL — ABNORMAL HIGH (ref 0.44–1.00)
GFR, Estimated: 43 mL/min — ABNORMAL LOW (ref >60)
Glucose, Bld: 154 mg/dL — ABNORMAL HIGH (ref 70–99)
Potassium: 4.5 mmol/L (ref 3.5–5.1)
Sodium: 134 mmol/L — ABNORMAL LOW (ref 135–145)

## 2021-12-20 LAB — CBC
HCT: 30.5 % — ABNORMAL LOW (ref 36.0–46.0)
Hemoglobin: 9.4 g/dL — ABNORMAL LOW (ref 12.0–15.0)
MCH: 22.6 pg — ABNORMAL LOW (ref 26.0–34.0)
MCHC: 30.8 g/dL (ref 30.0–36.0)
MCV: 73.3 fL — ABNORMAL LOW (ref 80.0–100.0)
Platelets: 347 10*3/uL (ref 150–400)
RBC: 4.16 MIL/uL (ref 3.87–5.11)
RDW: 22 % — ABNORMAL HIGH (ref 11.5–15.5)
WBC: 8.9 10*3/uL (ref 4.0–10.5)
nRBC: 0 % (ref 0.0–0.2)

## 2021-12-20 LAB — GLUCOSE, CAPILLARY
Glucose-Capillary: 192 mg/dL — ABNORMAL HIGH (ref 70–99)
Glucose-Capillary: 168 mg/dL — ABNORMAL HIGH (ref 70–99)
Glucose-Capillary: 154 mg/dL — ABNORMAL HIGH (ref 70–99)
Glucose-Capillary: 128 mg/dL — ABNORMAL HIGH (ref 70–99)
Glucose-Capillary: 266 mg/dL — ABNORMAL HIGH (ref 70–99)

## 2021-12-20 LAB — HEPATIC FUNCTION PANEL
ALT: 12 U/L (ref 0–44)
AST: 14 U/L — ABNORMAL LOW (ref 15–41)
Albumin: 3.3 g/dL — ABNORMAL LOW (ref 3.5–5.0)
Alkaline Phosphatase: 54 U/L (ref 38–126)
Bilirubin, Direct: 0.1 mg/dL (ref 0.0–0.2)
Indirect Bilirubin: 0.1 mg/dL — ABNORMAL LOW (ref 0.3–0.9)
Total Bilirubin: 0.2 mg/dL — ABNORMAL LOW (ref 0.3–1.2)
Total Protein: 7.2 g/dL (ref 6.5–8.1)

## 2021-12-20 LAB — MAGNESIUM: Magnesium: 2 mg/dL (ref 1.7–2.4)

## 2021-12-20 LAB — TSH: TSH: 1.247 u[IU]/mL (ref 0.350–4.500)

## 2021-12-20 LAB — APTT
aPTT: 67 seconds — ABNORMAL HIGH (ref 24–36)
aPTT: 75 seconds — ABNORMAL HIGH (ref 24–36)

## 2021-12-20 LAB — HEPARIN LEVEL (UNFRACTIONATED): Heparin Unfractionated: 1.1 IU/mL — ABNORMAL HIGH (ref 0.30–0.70)

## 2021-12-20 LAB — PHOSPHORUS: Phosphorus: 2.6 mg/dL (ref 2.5–4.6)

## 2021-12-20 MED ORDER — SODIUM CHLORIDE 0.9 % IV SOLN: INTRAVENOUS | Status: DC

## 2021-12-20 MED ORDER — PANTOPRAZOLE SODIUM 40 MG PO TBEC
40.0000 mg | DELAYED_RELEASE_TABLET | Freq: Every day | ORAL | Status: DC
  Administered 2021-12-20 – 2021-12-24 (×5): 40 mg via ORAL
  Filled 2021-12-20 (×5): qty 1

## 2021-12-20 MED ORDER — ORAL CARE MOUTH RINSE: 15.0000 mL | OROMUCOSAL | Status: DC | PRN

## 2021-12-20 MED ORDER — LORAZEPAM 1 MG PO TABS
1.0000 mg | ORAL_TABLET | Freq: Four times a day (QID) | ORAL | Status: DC | PRN
  Administered 2021-12-20 – 2021-12-23 (×7): 1 mg via ORAL
  Filled 2021-12-20 (×7): qty 1

## 2021-12-20 NOTE — Progress Notes (Signed)
PROGRESS NOTE    [name]  YBO:[id number] DOB: 03/22/1965 DOA: 12/19/2021 PCP: [name], MD   Brief Narrative:  The patient is a 56 year old African-American female with a past medical history significant for but not limited to CAD, hypertension, hyperlipidemia, renal artery stenosis, ongoing tobacco use, DVT as well as other comorbidities who presented with abnormal labs.  She has been having poor appetite for the last year or so and has worsened last several months and has lost about 25 pounds.  For the past month significantly dizzy and unsteady intermittently when she first gets out of bed.  She went to get her labs done outpatient was found to have an acute renal failure and was advised to go to the ED.  In the ED her glucose was noted to be down to 18.  Of note she has been recently started on new ACE hydrochlorothiazide combination for the past week and a half and has occasionally taken Advil for headache as well as taking metformin, Actos and glimepiride for diabetes but is not on any insulin.  In the ED she was having a temperature of 97.8 was normotensive on room air but she was found to be hypoglycemic in the 30s with acute renal failure and creatinine was noted to be 3.40 up from her prior creatinine of normal of 0.63.  She was evaluated for her fatigue and admitted for hypoglycemia as well as acute kidney injury.  She is getting IV fluid hydration and was placed on D10 drip overnight and this is now stopped and she is now changed to a normal saline at 75 MLS per hour.  Assessment and Plan: * Hypoglycemia -Suspect due to poor renal clearance of her antidiabetic medication of glimepiride in the setting of acute renal failure -Continue to hold all hold Diabetes Medication  -Continuous IV D10 has now stopped and will be changed to Normal Saline at 75 MLS per hour -CBGs changed from every 4 to before meals and at bedtime now -Monitor CBGs per protocol and will check hemoglobin  A1c in a.m. -Blood sugars now ranging from 50-192  History of DVT (deep vein thrombosis) -Venous doppler on 07/2021 showed DVT of right popliteal vein. -Will need to hold Xarelto due to acute renal failure and place on IV heparin infusion continue for now and once renal function improves changed back to Xarelto  Acute renal failure ([facility]CC) -Creatinine up to 3.40 from 0.63 baseline; patient's BUNs/creatinine is now slowly improving with IV fluid hydration and is now 57/1.4 to -Suspect a combination of dehydration, being on ACE-diuretic antihypertensive and occasional NSAIDS use. No symptoms concerning for post-obstructive process. Could consider renal ultrasound if renal function not improved with fluids overnight -Has received LR bolus in ED. Currently was on D10 IV and will change to normal saline at 75 MLS per hour continuous fluid for her concomitant hypoglycemia.   -Will need to hold Nephrotoxic medications, contrast dyes, hypotension and dehydration to ensure adequate renal perfusion  GERD (gastroesophageal reflux disease)/GI Prophylaxis  -PPI and tums x 1   Hypertension -Continue Amlodipine 10 mg p.o. daily -Continue to hold valsartan-HCTZ due to ARF -Continue to monitor blood pressures per protocol  -Last blood pressure reading was 120/81  Hyponatremia -Mild. -Patient's Sodium went from 136 -> 133 -> 134 -C/w NS at 75 mL/hr and Repeat CMP in the AM   Microcytic Anemia -Patient's hemoglobin/hematocrit went from 9.7/31.1 and is now 9.4/30.5 with an MCV of 73.3 -Check anemia panel in the a.m.  Continue to monitor for signs and symptoms and; no overt bleeding noted -Repeat CBC in a.m.  Unintentional Weight Loss -Patient states that she has lost about 25 pounds in the last few months and she states that she does not have a very good appetite -Check TSH in the a.m. -Chest x-ray done and showed "No acute cardiopulmonary disease. Moderate to large hiatal hernia potentially larger than  on previous imaging." -Will obtain CT scan of the abdomen pelvis with contrast for further evaluation once she gets further improvement in Renal Fxn and may need to discuss with Gastroenterology for further evaluation pending CT Scan  -Will consult nutrition for further evaluation recommendations  Depression and Anxiety -Continue with Escitalopram 10 mg p.o. daily as well as Trazodone 200 mg p.o. nightly  Hypoalbuminemia -Patient's Albumin level is now 3.3 -Continue to monitor and trend and repeat CMP in a.m.  DVT prophylaxis: Anticoagulated with a heparin drip    Code Status: Full Code Family Communication: No family present at bedside  Disposition Plan:  Level of care: Stepdown Status is: Observation The patient will require care spanning > 2 midnights and should be moved to inpatient because: Is being continued on IVF and will need close monitoring of her Blood Sugar   Consultants:  None  Procedures:  As delineated as above  Antimicrobials:  Anti-infectives (From admission, onward)    None       Subjective: Seen and examined at bedside and she was feeling a little bit better.  Had just ordered breakfast and denied chest pain or shortness of breath but states that she is lost about 25 pounds in the last few months.  States her appetite has been extremely poor and that she was feeling dizzy.  No chest pain or shortness breath.  No other concerns or complaints at this time.   Objective: Vitals:   12/20/21 0900 12/20/21 1000 12/20/21 1100 12/20/21 1200  BP:  118/76  120/81  Pulse: 83 (!) 115 94 94  Resp: '14 17 15 18  '$ Temp:      TempSrc:      SpO2: 99% 99% 100% 100%  Weight:      Height:        Intake/Output Summary (Last 24 hours) at 12/20/2021 1312 Last data filed at 12/20/2021 7253 Gross per 24 hour  Intake 2139.28 ml  Output --  Net 2139.28 ml   Filed Weights   12/19/21 1146 12/19/21 1900  Weight: 59.9 kg 59.1 kg   Examination: Physical  Exam:  Constitutional: WN/WD AAF in NAD appears calm Respiratory: Diminished to auscultation bilaterally with coarse breath sounds, no wheezing, rales, rhonchi or crackles. Normal respiratory effort and patient is not tachypenic. No accessory muscle use.  Unlabored breathing Cardiovascular: RRR, no murmurs / rubs / gallops. S1 and S2 auscultated.  Abdomen: Soft, non-tender, non-distended. Bowel sounds positive.  GU: Deferred. Musculoskeletal: No clubbing / cyanosis of digits/nails. No joint deformity upper and lower extremities.  Skin: No rashes, lesions, ulcers limited skin evaluation. No induration; Warm and dry.  Neurologic: CN 2-12 grossly intact with no focal deficits. Romberg sign and cerebellar reflexes not assessed.  Psychiatric: Normal judgment and insight. Alert and oriented x 3. Normal mood and appropriate affect.   Data Reviewed: I have personally reviewed following labs and imaging studies  CBC: Recent Labs  Lab 12/19/21 1200 12/20/21 0735  WBC 10.4 8.9  HGB 9.7* 9.4*  HCT 31.1* 30.5*  MCV 72.3* 73.3*  PLT 365 664   Basic Metabolic  Panel: Recent Labs  Lab 12/18/21 1441 12/19/21 1200 12/20/21 0735 12/20/21 0825  NA 136 133* 134*  --   K 5.6* 4.8 4.5  --   CL 96 99 101  --   CO2 '20 22 26  '$ --   GLUCOSE 50* 30* 154*  --   BUN 59* 84* 57*  --   CREATININE 3.15* 3.40* 1.42*  --   CALCIUM 10.9* 10.4* 9.3  --   MG  --   --   --  2.0  PHOS  --   --   --  2.6   GFR: Estimated Creatinine Clearance: 36.2 mL/min (A) (by C-G formula based on SCr of 1.42 mg/dL (H)). Liver Function Tests: Recent Labs  Lab 12/20/21 0735  AST 14*  ALT 12  ALKPHOS 54  BILITOT 0.2*  PROT 7.2  ALBUMIN 3.3*   No results for input(s): "LIPASE", "AMYLASE" in the last 168 hours. No results for input(s): "AMMONIA" in the last 168 hours. Coagulation Profile: No results for input(s): "INR", "PROTIME" in the last 168 hours. Cardiac Enzymes: No results for input(s): "CKTOTAL", "CKMB",  "CKMBINDEX", "TROPONINI" in the last 168 hours. BNP (last 3 results) No results for input(s): "PROBNP" in the last 8760 hours. HbA1C: No results for input(s): "HGBA1C" in the last 72 hours. CBG: Recent Labs  Lab 12/19/21 2005 12/19/21 2044 12/19/21 2341 12/20/21 0323 12/20/21 0803  GLUCAP 50* 85 192* 192* 168*   Lipid Profile: No results for input(s): "CHOL", "HDL", "LDLCALC", "TRIG", "CHOLHDL", "LDLDIRECT" in the last 72 hours. Thyroid Function Tests: No results for input(s): "TSH", "T4TOTAL", "FREET4", "T3FREE", "THYROIDAB" in the last 72 hours. Anemia Panel: No results for input(s): "VITAMINB12", "FOLATE", "FERRITIN", "TIBC", "IRON", "RETICCTPCT" in the last 72 hours. Sepsis Labs: No results for input(s): "PROCALCITON", "LATICACIDVEN" in the last 168 hours.  Recent Results (from the past 240 hour(s))  MRSA Next Gen by PCR, Nasal     Status: None   Collection Time: 12/19/21  7:03 PM   Specimen: Nasal Mucosa; Nasal Swab  Result Value Ref Range Status   MRSA by PCR Next Gen NOT DETECTED NOT DETECTED Final    Comment: (NOTE) The GeneXpert MRSA Assay (FDA approved for NASAL specimens only), is one component of a comprehensive MRSA colonization surveillance program. It is not intended to diagnose MRSA infection nor to guide or monitor treatment for MRSA infections. Test performance is not FDA approved in patients less than 22 years old. Performed at [facility], [place] [address] [place], [place] [address]     Radiology Studies: DG Chest Portable 1 View  Result Date: 12/19/2021 CLINICAL DATA:  Weakness, chronic kidney disease in a 56 year old female. EXAM: PORTABLE CHEST 1 VIEW COMPARISON:  Jun 12, 2021 FINDINGS: EKG leads project over the chest. Cardiomediastinal contours and hilar structures are normal. Moderate to large hiatal hernia potentially larger than on previous imaging projects in the retrocardiac region. No signs of consolidation. No sign of  pleural effusion. No pneumothorax. On limited assessment no acute skeletal process. IMPRESSION: 1. No acute cardiopulmonary disease. 2. Moderate to large hiatal hernia potentially larger than on previous imaging. Electronically Signed   By: Zetta Bills M.D.   On: 12/19/2021 12:47    Scheduled Meds:  amLODipine  10 mg Oral Daily   Chlorhexidine Gluconate Cloth  6 each Topical Daily   escitalopram  10 mg Oral Daily   sodium chloride flush  3 mL Intravenous Q12H   traZODone  200 mg Oral QHS  Continuous Infusions:  sodium chloride     sodium chloride 75 mL/hr at 12/20/21 1124   heparin 950 Units/hr (12/20/21 0655)    LOS: 0 days   Raiford Noble, DO [facility] Available via Epic secure chat 7am-7pm After these hours, please refer to coverage provider listed on [email or web address] 12/20/2021, 1:12 PM

## 2021-12-20 NOTE — Progress Notes (Signed)
All morning labs being obtained with AM heparin draw at 0800

## 2021-12-20 NOTE — Progress Notes (Signed)
ANTICOAGULATION CONSULT NOTE - Initial Consult  Pharmacy Consult for heparin Indication: VTE treatment  Allergies  Allergen Reactions   Almond Meal Anaphylaxis and Swelling   Other Anaphylaxis    Almond Butter   Aspirin Nausea Only    Other Reaction(s): Not available, Unknown    Patient Measurements: Height: '5\' 3"'$  (160 cm) Weight: 59.1 kg (130 lb 4.7 oz) IBW/kg (Calculated) : 52.4 Heparin Dosing Weight: 59.1 kg  Vital Signs: Temp: 98.2 F (36.8 C) (12/02 0833) Temp Source: Oral (12/02 0833) BP: 94/64 (12/02 0600) Pulse Rate: 87 (12/02 0600)  Labs: Recent Labs    12/18/21 1441 12/19/21 1200 12/19/21 2142 12/20/21 0735  HGB  --  9.7*  --  9.4*  HCT  --  31.1*  --  30.5*  PLT  --  365  --  347  APTT  --   --  30 67*  HEPARINUNFRC  --   --  >1.10* 1.10*  CREATININE 3.15* 3.40*  --  1.42*     Estimated Creatinine Clearance: 36.2 mL/min (A) (by C-G formula based on SCr of 1.42 mg/dL (H)).   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 Posey Pronto 02/27/2014   Tobacco abuse    Tubular adenoma of colon       Assessment: 56 yo female with abnormal labs,  hx of mild CAD, HTN, HLD, fatty liver disease, elevated LFTs, tobacco use, on xarelto for hx of  DVT.  Due to AKI pharmacy consulted to dose heparin drip. LD xarelto 11/30 @ 2100  Today, 12/20/21 07:35 aPTT therapeutic at 67 (and HL elevated at 1.1 as expected with recent Xarelto use) with heparin infusing at 950 units/hr;  Hgb low/stable 9.4 and PLT WNL No bleeding and no infusion interruption per nurse  Goal of Therapy:  Heparin level 0.3-0.7 units/ml aPTT 66-102 seconds Monitor platelets by anticoagulation protocol: Yes   Plan:  Continue heparin infusion at 950 units/hr Check confirmatory aPTT in 8 hours Monitor daily aPTT & heparin level, CBC, signs/symptoms of bleeding     Thank you for allowing pharmacy to be a part of this patient's care.  Royetta Asal, PharmD, BCPS Clinical Pharmacist [place] Please utilize Amion for appropriate phone number to reach the unit pharmacist ([place]) 12/20/2021 9:26 AM

## 2021-12-20 NOTE — Progress Notes (Signed)
ANTICOAGULATION CONSULT NOTE   Pharmacy Consult for heparin Indication: VTE treatment  Allergies  Allergen Reactions   Almond Meal Anaphylaxis and Swelling   Other Anaphylaxis    Almond Butter   Aspirin Nausea Only    Other Reaction(s): Not available, Unknown    Patient Measurements: Height: '5\' 3"'$  (160 cm) Weight: 59.1 kg (130 lb 4.7 oz) IBW/kg (Calculated) : 52.4 Heparin Dosing Weight: 59.1 kg  Vital Signs: Temp: 98 F (36.7 C) (12/02 1315) Temp Source: Oral (12/02 1315) BP: 128/80 (12/02 1400) Pulse Rate: 95 (12/02 1400)  Labs: Recent Labs    12/18/21 1441 12/19/21 1200 12/19/21 2142 12/20/21 0735 12/20/21 1600  HGB  --  9.7*  --  9.4*  --   HCT  --  31.1*  --  30.5*  --   PLT  --  365  --  347  --   APTT  --   --  30 67* 75*  HEPARINUNFRC  --   --  >1.10* 1.10*  --   CREATININE 3.15* 3.40*  --  1.42*  --      Estimated Creatinine Clearance: 36.2 mL/min (A) (by C-G formula based on SCr of 1.42 mg/dL (H)).   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 Posey Pronto 02/27/2014   Tobacco abuse    Tubular adenoma of colon       Assessment: 56 yo female with abnormal labs,  hx of mild CAD, HTN, HLD, fatty liver disease, elevated LFTs, tobacco  use, on xarelto for hx of DVT.  Due to AKI pharmacy consulted to dose heparin drip. LD xarelto 11/30 @ 2100  Today, 12/20/21 -aPTT remains therapeutic on heparin infusing at 950 units/hr  -Hgb stable 9.4 and PLT WNL -No bleeding or infusion issues noted per nursing  Goal of Therapy:  Heparin level 0.3-0.7 units/ml aPTT 66-102 seconds Monitor platelets by anticoagulation protocol: Yes   Plan:  -Continue heparin infusion at 950 units/hr -Daily aPTT, heparin level and CBC -Monitor for signs/symptoms of bleeding   Tawnya Crook, PharmD, BCPS Clinical Pharmacist 12/20/2021 5:15 PM

## 2021-12-20 NOTE — Plan of Care (Signed)
Discussed with patient plan of care for the evening, pain management and medications with some teach back displayed ° °Problem: Education: °Goal: Knowledge of General Education information will improve °Description: Including pain rating scale, medication(s)/side effects and non-pharmacologic comfort measures °Outcome: Progressing °  °

## 2021-12-21 ENCOUNTER — Inpatient Hospital Stay (HOSPITAL_COMMUNITY): Payer: Commercial Managed Care - HMO

## 2021-12-21 DIAGNOSIS — Z86718 Personal history of other venous thrombosis and embolism: Secondary | ICD-10-CM | POA: Diagnosis not present

## 2021-12-21 DIAGNOSIS — E162 Hypoglycemia, unspecified: Secondary | ICD-10-CM | POA: Diagnosis not present

## 2021-12-21 DIAGNOSIS — K219 Gastro-esophageal reflux disease without esophagitis: Secondary | ICD-10-CM | POA: Diagnosis not present

## 2021-12-21 DIAGNOSIS — N179 Acute kidney failure, unspecified: Secondary | ICD-10-CM | POA: Diagnosis not present

## 2021-12-21 LAB — RETICULOCYTES
Immature Retic Fract: 11.6 % (ref 2.3–15.9)
RBC.: 3.76 MIL/uL — ABNORMAL LOW (ref 3.87–5.11)
Retic Count, Absolute: 60.5 10*3/uL (ref 19.0–186.0)
Retic Ct Pct: 1.6 % (ref 0.4–3.1)

## 2021-12-21 LAB — GLUCOSE, CAPILLARY
Glucose-Capillary: 145 mg/dL — ABNORMAL HIGH (ref 70–99)
Glucose-Capillary: 115 mg/dL — ABNORMAL HIGH (ref 70–99)
Glucose-Capillary: 109 mg/dL — ABNORMAL HIGH (ref 70–99)
Glucose-Capillary: 162 mg/dL — ABNORMAL HIGH (ref 70–99)

## 2021-12-21 LAB — CBC WITH DIFFERENTIAL/PLATELET
Abs Immature Granulocytes: 0.02 10*3/uL (ref 0.00–0.07)
Basophils Absolute: 0.1 10*3/uL (ref 0.0–0.1)
Basophils Relative: 1 %
Eosinophils Absolute: 0.3 10*3/uL (ref 0.0–0.5)
Eosinophils Relative: 4 %
HCT: 28 % — ABNORMAL LOW (ref 36.0–46.0)
Hemoglobin: 8.6 g/dL — ABNORMAL LOW (ref 12.0–15.0)
Immature Granulocytes: 0 %
Lymphocytes Relative: 37 %
Lymphs Abs: 3.1 10*3/uL (ref 0.7–4.0)
MCH: 22.9 pg — ABNORMAL LOW (ref 26.0–34.0)
MCHC: 30.7 g/dL (ref 30.0–36.0)
MCV: 74.5 fL — ABNORMAL LOW (ref 80.0–100.0)
Monocytes Absolute: 0.6 10*3/uL (ref 0.1–1.0)
Monocytes Relative: 7 %
Neutro Abs: 4.2 10*3/uL (ref 1.7–7.7)
Neutrophils Relative %: 51 %
Platelets: 305 10*3/uL (ref 150–400)
RBC: 3.76 MIL/uL — ABNORMAL LOW (ref 3.87–5.11)
RDW: 22.3 % — ABNORMAL HIGH (ref 11.5–15.5)
WBC: 8.3 10*3/uL (ref 4.0–10.5)
nRBC: 0 % (ref 0.0–0.2)

## 2021-12-21 LAB — COMPREHENSIVE METABOLIC PANEL
ALT: 17 U/L (ref 0–44)
AST: 20 U/L (ref 15–41)
Albumin: 3 g/dL — ABNORMAL LOW (ref 3.5–5.0)
Alkaline Phosphatase: 48 U/L (ref 38–126)
Anion gap: 6 (ref 5–15)
BUN: 33 mg/dL — ABNORMAL HIGH (ref 6–20)
CO2: 25 mmol/L (ref 22–32)
Calcium: 8.5 mg/dL — ABNORMAL LOW (ref 8.9–10.3)
Chloride: 109 mmol/L (ref 98–111)
Creatinine, Ser: 0.99 mg/dL (ref 0.44–1.00)
GFR, Estimated: >60 mL/min (ref >60)
Glucose, Bld: 113 mg/dL — ABNORMAL HIGH (ref 70–99)
Potassium: 4.3 mmol/L (ref 3.5–5.1)
Sodium: 140 mmol/L (ref 135–145)
Total Bilirubin: 0.2 mg/dL — ABNORMAL LOW (ref 0.3–1.2)
Total Protein: 6.5 g/dL (ref 6.5–8.1)

## 2021-12-21 LAB — FERRITIN: Ferritin: 17 ng/mL (ref 11–307)

## 2021-12-21 LAB — IRON AND TIBC
Iron: 22 ug/dL — ABNORMAL LOW (ref 28–170)
Saturation Ratios: 4 % — ABNORMAL LOW (ref 10.4–31.8)
TIBC: 530 ug/dL — ABNORMAL HIGH (ref 250–450)
UIBC: 508 ug/dL

## 2021-12-21 LAB — HEPARIN LEVEL (UNFRACTIONATED)
Heparin Unfractionated: >1.1 IU/mL — ABNORMAL HIGH (ref 0.30–0.70)
Heparin Unfractionated: >1.1 IU/mL — ABNORMAL HIGH (ref 0.30–0.70)

## 2021-12-21 LAB — APTT
aPTT: 186 seconds (ref 24–36)
aPTT: 199 seconds (ref 24–36)
aPTT: 120 seconds — ABNORMAL HIGH (ref 24–36)

## 2021-12-21 LAB — PHOSPHORUS: Phosphorus: 2.1 mg/dL — ABNORMAL LOW (ref 2.5–4.6)

## 2021-12-21 LAB — VITAMIN B12: Vitamin B-12: 423 pg/mL (ref 180–914)

## 2021-12-21 LAB — TSH: TSH: 0.808 u[IU]/mL (ref 0.350–4.500)

## 2021-12-21 LAB — FOLATE: Folate: 18.5 ng/mL (ref >5.9)

## 2021-12-21 LAB — MAGNESIUM: Magnesium: 1.7 mg/dL (ref 1.7–2.4)

## 2021-12-21 MED ORDER — IOHEXOL 300 MG/ML  SOLN
80.0000 mL | Freq: Once | INTRAMUSCULAR | Status: AC | PRN
  Administered 2021-12-21: 80 mL via INTRAVENOUS

## 2021-12-21 MED ORDER — ALLOPURINOL 300 MG PO TABS
300.0000 mg | ORAL_TABLET | Freq: Every day | ORAL | Status: DC
  Administered 2021-12-21 – 2021-12-24 (×4): 300 mg via ORAL
  Filled 2021-12-21 (×4): qty 1

## 2021-12-21 MED ORDER — COLCHICINE 0.3 MG HALF TABLET
0.3000 mg | ORAL_TABLET | Freq: Two times a day (BID) | ORAL | Status: DC
  Administered 2021-12-22 – 2021-12-24 (×5): 0.3 mg via ORAL
  Filled 2021-12-21 (×5): qty 1

## 2021-12-21 MED ORDER — HEPARIN (PORCINE) 25000 UT/250ML-% IV SOLN: 750.0000 [IU]/h | INTRAVENOUS | Status: DC

## 2021-12-21 MED ORDER — PREDNISONE 20 MG PO TABS: 40.0000 mg | ORAL_TABLET | Freq: Every day | ORAL | Status: DC

## 2021-12-21 MED ORDER — MAGNESIUM SULFATE 2 GM/50ML IV SOLN
2.0000 g | Freq: Once | INTRAVENOUS | Status: AC
  Administered 2021-12-21: 2 g via INTRAVENOUS
  Filled 2021-12-21: qty 50

## 2021-12-21 MED ORDER — K PHOS MONO-SOD PHOS DI & MONO 155-852-130 MG PO TABS
500.0000 mg | ORAL_TABLET | Freq: Two times a day (BID) | ORAL | Status: AC
  Administered 2021-12-21 (×2): 500 mg via ORAL
  Filled 2021-12-21 (×2): qty 2

## 2021-12-21 MED ORDER — COLCHICINE 0.6 MG PO TABS
0.6000 mg | ORAL_TABLET | Freq: Once | ORAL | Status: AC
  Administered 2021-12-21: 0.6 mg via ORAL
  Filled 2021-12-21: qty 1

## 2021-12-21 MED ORDER — LORAZEPAM 1 MG PO TABS
1.0000 mg | ORAL_TABLET | Freq: Once | ORAL | Status: AC
  Administered 2021-12-21: 1 mg via ORAL
  Filled 2021-12-21: qty 1

## 2021-12-21 MED ORDER — HEPARIN (PORCINE) 25000 UT/250ML-% IV SOLN
550.0000 [IU]/h | INTRAVENOUS | Status: AC
  Administered 2021-12-21 – 2021-12-22 (×2): 550 [IU]/h via INTRAVENOUS
  Filled 2021-12-21: qty 250

## 2021-12-21 NOTE — TOC Initial Note (Signed)
Transition of Care [facility]) - Initial/Assessment Note    Patient Details  Name: Stacy [name] MRN: [id number] Date of Birth: [birth year]  Transition of Care [facility]) CM/SW Contact:    Henrietta Dine, RN Phone Number: 12/21/2021, 5:58 PM  Clinical Narrative:                  Transition of Care [facility]) Screening Note   Patient Details  Name: Stacy [name] Date of Birth: 12/26/1965   Transition of Care [facility]) CM/SW Contact:    Henrietta Dine, RN Phone Number: 12/21/2021, 5:58 PM    Transition of Care Department [facility]) has reviewed patient and no TOC needs have been identified at this time. We will continue to monitor patient advancement through interdisciplinary progression rounds. If new patient transition needs arise, please place a TOC consult.      Barriers to Discharge: Continued Medical Work up   Patient Goals and CMS Choice        Expected Discharge Plan and Services                                                Prior Living Arrangements/Services                       Activities of Daily Living Home Assistive Devices/Equipment: Eyeglasses ADL Screening (condition at time of admission) Patient's cognitive ability adequate to safely complete daily activities?: Yes Is the patient deaf or have difficulty hearing?: No Does the patient have difficulty seeing, even when wearing glasses/contacts?: No Does the patient have difficulty concentrating, remembering, or making decisions?: No Patient able to express need for assistance with ADLs?: No Does the patient have difficulty dressing or bathing?: No Independently performs ADLs?: Yes (appropriate for developmental age) Does the patient have difficulty walking or climbing stairs?: No Weakness of Legs: Both Weakness of Arms/Hands: Both  Permission Sought/Granted                  Emotional Assessment              Admission diagnosis:  Hypoglycemia [E16.2] Acute kidney  injury ([place]) [N17.9] Patient Active Problem List   Diagnosis Date Noted   Hypoglycemia 12/19/2021   History of DVT (deep vein thrombosis) 12/19/2021   Acute deep vein thrombosis (DVT) of femoral vein of right lower extremity ([place]) 04/07/2021   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 Posey Pronto 02/27/2014   EtOH dependence ([place]) 11/22/2013   Tobacco use 10/04/2013   MDD (major depressive disorder) 08/23/2013   Colon polyp 08/23/2013   Migraines 08/23/2013   GERD (gastroesophageal reflux disease)    Type 2 diabetes mellitus with other specified complication ([place])    Gout    Hypertension    Hyperlipemia    PCP:  [name], MD Pharmacy:   [id number]  [place], [place] - [address] [place] [address] Phone: [phone] Fax: [phone]  [id number] [place] [id number] [place]Phone: [phone]: [phone]    Social Determinants of Health (SDOH) Interventions    Readmission Risk Interventions     No data to display

## 2021-12-21 NOTE — Progress Notes (Signed)
PROGRESS NOTE    Stacy [name]  BJS:[id number] DOB: 08-Jan-1966 DOA: 12/19/2021 PCP: [name], MD   Brief Narrative:  The patient is a 56 year old African-American female with a past medical history significant for but not limited to CAD, hypertension, hyperlipidemia, renal artery stenosis, ongoing tobacco use, DVT as well as other comorbidities who presented with abnormal labs. She has been having poor appetite for the last year or so and has worsened last several months and has lost about 25 pounds. For the past month significantly dizzy and unsteady intermittently when she first gets out of bed. She went to get her labs done outpatient was found to have an acute renal failure and was advised to go to the ED. In the ED her glucose was noted to be down to 18. Of note she has been recently started on new ACE hydrochlorothiazide combination for the past week and a half and has occasionally taken Advil for headache as well as taking metformin, Actos and glimepiride for diabetes but is not on any insulin. In the ED she was having a temperature of 97.8 was normotensive on room air but she was found to be hypoglycemic in the 30s with acute renal failure and creatinine was noted to be 3.40 up from her prior creatinine of normal of 0.63. She was evaluated for her fatigue and admitted for hypoglycemia as well as acute kidney injury. She is getting IV fluid hydration and was placed on D10 drip overnight and this is now stopped and she is now changed to a normal saline at 75 MLS per hour.   Assessment and Plan: * Hypoglycemia, improved -Suspect due to poor renal clearance of her antidiabetic medication of glimepiride in the setting of acute renal failure -Continue to hold all hold Diabetes Medication  -Continuous IV D10 has now stopped and will be changed to Normal Saline at 75 MLS per hour -CBGs changed from every 4 to before meals and at bedtime now -Monitor CBGs per protocol and will check  hemoglobin A1c in a.m. -Blood sugars now ranging from 128-266   History of DVT (deep vein thrombosis) -Venous doppler on 07/2021 showed DVT of right popliteal vein. -Will need to hold Xarelto due to acute renal failure and place on IV heparin infusion continue for now and once renal function improves changed back to Xarelto likely in the AM    Acute renal failure ([place]), improving  -Creatinine up to 3.40 from 0.63 baseline; patient's BUNs/creatinine is now slowly improving with IV fluid hydration and now gone from 59/3.15 -> 84/3.40 -> 57/1.42 -> 33/0.99 -Suspect a combination of dehydration, being on ACE-diuretic antihypertensive and occasional NSAIDS use. No symptoms concerning for post-obstructive process. Could consider renal ultrasound if renal function not improved with fluids overnight -Has received LR bolus in ED. Currently was on D10 IV and will change to normal saline at 75 MLS per hour continuous fluid for her concomitant hypoglycemia.   -Will need to hold Nephrotoxic medications, contrast dyes if possible, hypotension and dehydration to ensure adequate renal perfusion   GERD (gastroesophageal reflux disease)/GI Prophylaxis  -C/w PPI and tums x 1    Hypertension -Continue Amlodipine 10 mg p.o. daily -Continue to hold valsartan-HCTZ due to ARF -Continue to monitor blood pressures per protocol  -Last blood pressure reading was 116/79   Hyponatremia -Mild. -Patient's Sodium went from 136 -> 133 -> 134 -> 140 -C/w NS at 75 mL/hr and Repeat CMP in the AM    Microcytic Anemia -Patient's hemoglobin/hematocrit  went from 9.7/31.1 -> 9.4/30.5 -> 8.6/28.0 with an MCV of 74.5 -Checked Anemia panel showed an iron level of 22, UIBC 508, TIBC of 530, saturation ratios of 4%, ferritin level 17, vitamin B12 42  Continue to monitor for signs and symptoms and; no overt bleeding noted -Repeat CBC in a.m.   Unintentional Weight Loss -Patient states that she has lost about 25 pounds in the last  few months and she states that she does not have a very good appetite -Check TSH and first time was 1.247 and repeat was 0.808 -Chest x-ray done and showed "No acute cardiopulmonary disease. Moderate to large hiatal hernia potentially larger than on previous imaging." -Will obtain CT scan of the abdomen pelvis with contrast for further evaluation now that she has further improvement in Renal Fxn and may need to discuss with Gastroenterology for further evaluation pending CT Scan  -Will consult nutrition for further evaluation recommendations   Depression and Anxiety -Continue with Escitalopram 10 mg p.o. daily as well as Trazodone 200 mg p.o. nightly   Hypoalbuminemia -Patient's Albumin level is now 3.3 -> 3.0 -Continue to monitor and trend and repeat CMP in a.m.   Hypophosphatemia -Patient's Phos Level is now 2.1 -Replete with po K Phos Neutral 500 mg x1 -Continue to Monitor and Replete as Necessary -Repeat Phos Level in the AM  Gout -Has Gout of the Big Toe and thinks she is having a flare  -Start Prednisone 40 mg po Daily and Resume Allopurinol 300 mg   DVT prophylaxis: Anticoagulated with a Heparin gtt    Code Status: Full Code Family Communication: No family present at bedside  Disposition Plan:  Level of care: Telemetry Status is: Inpatient Remains inpatient appropriate because: Needs further clinical improvement and will be working up her weight loss with a CT scan of the abdomen pelvis after discussion with GI   Consultants:  None  Procedures:  As delineated as well  Antimicrobials:  Anti-infectives (From admission, onward)    None       Subjective: Seen and examined at bedside and was doing better and renal function continues to improve.  Complaining of a some soreness and worried about her weight loss of 25 pounds.  Appetite is doing a little bit better.  Denied chest pain or shortness of breath..  No other concerns or close at this time but then subsequently  she started complaining about possibly having some gout flare in her big toe.  Objective: Vitals:   12/21/21 0021 12/21/21 0616 12/21/21 0949 12/21/21 1354  BP: (!) 96/59 (!) 108/93 93/60 116/79  Pulse: 83 96 88 88  Resp: '17 17 14 17  '$ Temp: 98.6 F (37 C) 98.6 F (37 C) 99 F (37.2 C) 98.1 F (36.7 C)  TempSrc: Oral Oral Oral Oral  SpO2: 99%  96% 97%  Weight:      Height:        Intake/Output Summary (Last 24 hours) at 12/21/2021 1500 Last data filed at 12/21/2021 0900 Gross per 24 hour  Intake 1875.1 ml  Output --  Net 1875.1 ml   Filed Weights   12/19/21 1146 12/19/21 1900  Weight: 59.9 kg 59.1 kg   Examination: Physical Exam:  Constitutional: WN/WD African-American female currently no acute distress Respiratory: Diminished to auscultation bilaterally, no wheezing, rales, rhonchi or crackles. Normal respiratory effort and patient is not tachypenic. No accessory muscle use.  Unlabored breathing Cardiovascular: RRR, no murmurs / rubs / gallops. S1 and S2 auscultated.  Abdomen: Soft,  non-tender, non-distended. Bowel sounds positive.  GU: Deferred. Musculoskeletal: No clubbing / cyanosis of digits/nails. No joint deformity upper and lower extremities.  Skin: No rashes, lesions, ulcers limited skin evaluation. No induration; Warm and dry.  Neurologic: CN 2-12 grossly intact with no focal deficits.  Romberg sign and cerebellar reflexes not assessed.  Psychiatric: Normal judgment and insight. Alert and oriented x 3. Normal mood and appropriate affect.   Data Reviewed: I have personally reviewed following labs and imaging studies  CBC: Recent Labs  Lab 12/19/21 1200 12/20/21 0735 12/21/21 0548  WBC 10.4 8.9 8.3  NEUTROABS  --   --  4.2  HGB 9.7* 9.4* 8.6*  HCT 31.1* 30.5* 28.0*  MCV 72.3* 73.3* 74.5*  PLT 365 347 270   Basic Metabolic Panel: Recent Labs  Lab 12/18/21 1441 12/19/21 1200 12/20/21 0735 12/20/21 0825 12/21/21 0548  NA 136 133* 134*  --  140  K  5.6* 4.8 4.5  --  4.3  CL 96 99 101  --  109  CO2 '20 22 26  '$ --  25  GLUCOSE 50* 30* 154*  --  113*  BUN 59* 84* 57*  --  33*  CREATININE 3.15* 3.40* 1.42*  --  0.99  CALCIUM 10.9* 10.4* 9.3  --  8.5*  MG  --   --   --  2.0 1.7  PHOS  --   --   --  2.6 2.1*   GFR: Estimated Creatinine Clearance: 51.9 mL/min (by C-G formula based on SCr of 0.99 mg/dL). Liver Function Tests: Recent Labs  Lab 12/20/21 0735 12/21/21 0548  AST 14* 20  ALT 12 17  ALKPHOS 54 48  BILITOT 0.2* 0.2*  PROT 7.2 6.5  ALBUMIN 3.3* 3.0*   No results for input(s): "LIPASE", "AMYLASE" in the last 168 hours. No results for input(s): "AMMONIA" in the last 168 hours. Coagulation Profile: No results for input(s): "INR", "PROTIME" in the last 168 hours. Cardiac Enzymes: No results for input(s): "CKTOTAL", "CKMB", "CKMBINDEX", "TROPONINI" in the last 168 hours. BNP (last 3 results) No results for input(s): "PROBNP" in the last 8760 hours. HbA1C: No results for input(s): "HGBA1C" in the last 72 hours. CBG: Recent Labs  Lab 12/20/21 1202 12/20/21 1616 12/20/21 2022 12/21/21 0723 12/21/21 1132  GLUCAP 154* 128* 266* 145* 115*   Lipid Profile: No results for input(s): "CHOL", "HDL", "LDLCALC", "TRIG", "CHOLHDL", "LDLDIRECT" in the last 72 hours. Thyroid Function Tests: Recent Labs    12/21/21 0548  TSH 0.808   Anemia Panel: Recent Labs    12/21/21 0548  VITAMINB12 423  FERRITIN 17  TIBC 530*  IRON 22*  RETICCTPCT 1.6   Sepsis Labs: No results for input(s): "PROCALCITON", "LATICACIDVEN" in the last 168 hours.  Recent Results (from the past 240 hour(s))  MRSA Next Gen by PCR, Nasal     Status: None   Collection Time: 12/19/21  7:03 PM   Specimen: Nasal Mucosa; Nasal Swab  Result Value Ref Range Status   MRSA by PCR Next Gen NOT DETECTED NOT DETECTED Final    Comment: (NOTE) The GeneXpert MRSA Assay (FDA approved for NASAL specimens only), is one component of a comprehensive MRSA  colonization surveillance program. It is not intended to diagnose MRSA infection nor to guide or monitor treatment for MRSA infections. Test performance is not FDA approved in patients less than 31 years old. Performed at [facility], [place] [address] [place], [place] [address]     Radiology Studies: No  results found.  Scheduled Meds:  allopurinol  300 mg Oral Daily   amLODipine  10 mg Oral Daily   Chlorhexidine Gluconate Cloth  6 each Topical Daily   escitalopram  10 mg Oral Daily   pantoprazole  40 mg Oral Daily   phosphorus  500 mg Oral BID   [START ON 12/22/2021] predniSONE  40 mg Oral Q breakfast   sodium chloride flush  3 mL Intravenous Q12H   traZODone  200 mg Oral QHS   Continuous Infusions:  sodium chloride     sodium chloride 75 mL/hr at 12/21/21 0648   heparin 750 Units/hr (12/21/21 1227)    LOS: 1 day   Raiford Noble, DO [facility] Available via Epic secure chat 7am-7pm After these hours, please refer to coverage provider listed on [email or web address] 12/21/2021, 3:00 PM

## 2021-12-21 NOTE — Progress Notes (Signed)
ANTICOAGULATION CONSULT NOTE   Pharmacy Consult for heparin Indication: VTE treatment  Allergies  Allergen Reactions   Almond Meal Anaphylaxis and Swelling   Other Anaphylaxis    Almond Butter   Aspirin Nausea Only    Other Reaction(s): Not available, Unknown    Patient Measurements: Height: '5\' 3"'$  (160 cm) Weight: 59.1 kg (130 lb 4.7 oz) IBW/kg (Calculated) : 52.4 Heparin Dosing Weight: 59.1 kg  Vital Signs: Temp: 99 F (37.2 C) (12/03 0949) Temp Source: Oral (12/03 0949) BP: 93/60 (12/03 0949) Pulse Rate: 88 (12/03 0949)  Labs: Recent Labs    12/19/21 1200 12/19/21 2142 12/20/21 0735 12/20/21 1600 12/21/21 0548 12/21/21 0857  HGB 9.7*  --  9.4*  --  8.6*  --   HCT 31.1*  --  30.5*  --  28.0*  --   PLT 365  --  347  --  305  --   APTT  --    < > 67* 75* 186* 199*  HEPARINUNFRC  --    < > 1.10*  --  >1.10* >1.10*  CREATININE 3.40*  --  1.42*  --  0.99  --    < > = values in this interval not displayed.     Estimated Creatinine Clearance: 51.9 mL/min (by C-G formula based on SCr of 0.99 mg/dL).   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 Posey Pronto 02/27/2014   Tobacco abuse    Tubular adenoma of colon       Assessment: 56 yo female with  abnormal labs,  hx of mild CAD, HTN, HLD, fatty liver disease, elevated LFTs, tobacco use, on xarelto for hx of DVT.  Due to AKI pharmacy consulted to dose heparin drip. LD xarelto 11/30 @ 2100  Today, 12/21/21 -aPTT supra-therapeutic x 2 on heparin infusing at 950 units/hr  -Hgb down 8.6 and PLT WNL -No bleeding or infusion issues noted per nursing  Goal of Therapy:  Heparin level 0.3-0.7 units/ml aPTT 66-102 seconds Monitor platelets by anticoagulation protocol: Yes   Plan:  -Hold infusion for 1 hour then decrease heparin infusion rate to  750 units/hr -6 hour aPTT -Daily aPTT, heparin level and CBC -Monitor for signs/symptoms of bleeding   Suzzanne Cloud, PharmD, BCPS Clinical Pharmacist 12/21/2021 11:04 AM

## 2021-12-21 NOTE — Progress Notes (Addendum)
ANTICOAGULATION CONSULT NOTE   Pharmacy Consult for heparin Indication: VTE treatment  Allergies  Allergen Reactions   Almond Meal Anaphylaxis and Swelling   Other Anaphylaxis    Almond Butter   Aspirin Nausea Only    Other Reaction(s): Not available, Unknown    Patient Measurements: Height: '5\' 3"'$  (160 cm) Weight: 59.1 kg (130 lb 4.7 oz) IBW/kg (Calculated) : 52.4 Heparin Dosing Weight: 59.1 kg  Vital Signs: Temp: 98.1 F (36.7 C) (12/03 1354) Temp Source: Oral (12/03 1354) BP: 116/79 (12/03 1354) Pulse Rate: 88 (12/03 1354)  Labs: Recent Labs    12/19/21 1200 12/19/21 2142 12/20/21 0735 12/20/21 1600 12/21/21 0548 12/21/21 0857 12/21/21 1857  HGB 9.7*  --  9.4*  --  8.6*  --   --   HCT 31.1*  --  30.5*  --  28.0*  --   --   PLT 365  --  347  --  305  --   --   APTT  --    < > 67*   < > 186* 199* 120*  HEPARINUNFRC  --    < > 1.10*  --  >1.10* >1.10*  --   CREATININE 3.40*  --  1.42*  --  0.99  --   --    < > = values in this interval not displayed.     Estimated Creatinine Clearance: 51.9 mL/min (by C-G formula based on SCr of 0.99 mg/dL).  Assessment: 56 yo female with abnormal labs,  hx of mild CAD, HTN, HLD, fatty liver disease, elevated LFTs, tobacco use, on xarelto for hx of DVT.  Due to AKI pharmacy consulted to dose heparin drip. LD xarelto 11/30 @ 2100  Today, 12/21/21 -aPTT supra-therapeutic x 3 on heparin infusing at 750 units/hr  -Hgb down 8.6 and PLT WNL -No bleeding or infusion issues noted per nursing  Goal of Therapy:  Heparin level 0.3-0.7 units/ml aPTT 66-102 seconds Monitor platelets by anticoagulation protocol: Yes   Plan:  -Hold infusion for 1 hour then decrease heparin infusion rate to 550 units/hr -8 hour aPTT -Daily aPTT, heparin level and CBC -Monitor for signs/symptoms of bleeding   Peggyann Juba, PharmD, BCPS Pharmacy: [phone] 12/21/2021 7:58 PM

## 2021-12-22 DIAGNOSIS — K219 Gastro-esophageal reflux disease without esophagitis: Secondary | ICD-10-CM | POA: Diagnosis not present

## 2021-12-22 DIAGNOSIS — D508 Other iron deficiency anemias: Secondary | ICD-10-CM

## 2021-12-22 DIAGNOSIS — Z86718 Personal history of other venous thrombosis and embolism: Secondary | ICD-10-CM | POA: Diagnosis not present

## 2021-12-22 DIAGNOSIS — E162 Hypoglycemia, unspecified: Secondary | ICD-10-CM | POA: Diagnosis not present

## 2021-12-22 DIAGNOSIS — N179 Acute kidney failure, unspecified: Secondary | ICD-10-CM | POA: Diagnosis not present

## 2021-12-22 DIAGNOSIS — R1310 Dysphagia, unspecified: Secondary | ICD-10-CM

## 2021-12-22 DIAGNOSIS — R634 Abnormal weight loss: Secondary | ICD-10-CM

## 2021-12-22 LAB — COMPREHENSIVE METABOLIC PANEL
ALT: 19 U/L (ref 0–44)
AST: 20 U/L (ref 15–41)
Albumin: 2.9 g/dL — ABNORMAL LOW (ref 3.5–5.0)
Alkaline Phosphatase: 43 U/L (ref 38–126)
Anion gap: 5 (ref 5–15)
BUN: 18 mg/dL (ref 6–20)
CO2: 23 mmol/L (ref 22–32)
Calcium: 8.1 mg/dL — ABNORMAL LOW (ref 8.9–10.3)
Chloride: 112 mmol/L — ABNORMAL HIGH (ref 98–111)
Creatinine, Ser: 0.76 mg/dL (ref 0.44–1.00)
GFR, Estimated: >60 mL/min (ref >60)
Glucose, Bld: 115 mg/dL — ABNORMAL HIGH (ref 70–99)
Potassium: 4.1 mmol/L (ref 3.5–5.1)
Sodium: 140 mmol/L (ref 135–145)
Total Bilirubin: 0.3 mg/dL (ref 0.3–1.2)
Total Protein: 6.3 g/dL — ABNORMAL LOW (ref 6.5–8.1)

## 2021-12-22 LAB — CBC WITH DIFFERENTIAL/PLATELET
Abs Immature Granulocytes: 0.02 10*3/uL (ref 0.00–0.07)
Basophils Absolute: 0.1 10*3/uL (ref 0.0–0.1)
Basophils Relative: 1 %
Eosinophils Absolute: 0.3 10*3/uL (ref 0.0–0.5)
Eosinophils Relative: 4 %
HCT: 26.9 % — ABNORMAL LOW (ref 36.0–46.0)
Hemoglobin: 8.2 g/dL — ABNORMAL LOW (ref 12.0–15.0)
Immature Granulocytes: 0 %
Lymphocytes Relative: 37 %
Lymphs Abs: 2.9 10*3/uL (ref 0.7–4.0)
MCH: 22.9 pg — ABNORMAL LOW (ref 26.0–34.0)
MCHC: 30.5 g/dL (ref 30.0–36.0)
MCV: 75.1 fL — ABNORMAL LOW (ref 80.0–100.0)
Monocytes Absolute: 0.6 10*3/uL (ref 0.1–1.0)
Monocytes Relative: 8 %
Neutro Abs: 3.9 10*3/uL (ref 1.7–7.7)
Neutrophils Relative %: 50 %
Platelets: 319 10*3/uL (ref 150–400)
RBC: 3.58 MIL/uL — ABNORMAL LOW (ref 3.87–5.11)
RDW: 22.6 % — ABNORMAL HIGH (ref 11.5–15.5)
WBC: 7.9 10*3/uL (ref 4.0–10.5)
nRBC: 0 % (ref 0.0–0.2)

## 2021-12-22 LAB — PHOSPHORUS: Phosphorus: 2 mg/dL — ABNORMAL LOW (ref 2.5–4.6)

## 2021-12-22 LAB — APTT: aPTT: 58 seconds — ABNORMAL HIGH (ref 24–36)

## 2021-12-22 LAB — MAGNESIUM: Magnesium: 1.7 mg/dL (ref 1.7–2.4)

## 2021-12-22 LAB — HEPARIN LEVEL (UNFRACTIONATED)
Heparin Unfractionated: 0.43 IU/mL (ref 0.30–0.70)
Heparin Unfractionated: 0.47 IU/mL (ref 0.30–0.70)

## 2021-12-22 LAB — GLUCOSE, CAPILLARY
Glucose-Capillary: 120 mg/dL — ABNORMAL HIGH (ref 70–99)
Glucose-Capillary: 137 mg/dL — ABNORMAL HIGH (ref 70–99)
Glucose-Capillary: 134 mg/dL — ABNORMAL HIGH (ref 70–99)

## 2021-12-22 MED ORDER — ADULT MULTIVITAMIN W/MINERALS CH
1.0000 | ORAL_TABLET | Freq: Every day | ORAL | Status: DC
  Administered 2021-12-22 – 2021-12-24 (×3): 1 via ORAL
  Filled 2021-12-22 (×3): qty 1

## 2021-12-22 MED ORDER — PEG-KCL-NACL-NASULF-NA ASC-C 100 G PO SOLR
0.5000 | Freq: Once | ORAL | Status: AC
  Administered 2021-12-22: 100 g via ORAL

## 2021-12-22 MED ORDER — PEG-KCL-NACL-NASULF-NA ASC-C 100 G PO SOLR
0.5000 | Freq: Once | ORAL | Status: DC
  Filled 2021-12-22: qty 1

## 2021-12-22 MED ORDER — MAGNESIUM SULFATE 2 GM/50ML IV SOLN
2.0000 g | Freq: Once | INTRAVENOUS | Status: AC
  Administered 2021-12-22: 2 g via INTRAVENOUS
  Filled 2021-12-22: qty 50

## 2021-12-22 MED ORDER — K PHOS MONO-SOD PHOS DI & MONO 155-852-130 MG PO TABS
500.0000 mg | ORAL_TABLET | Freq: Two times a day (BID) | ORAL | Status: AC
  Administered 2021-12-22 (×2): 500 mg via ORAL
  Filled 2021-12-22 (×2): qty 2

## 2021-12-22 MED ORDER — METOCLOPRAMIDE HCL 5 MG/ML IJ SOLN
10.0000 mg | Freq: Once | INTRAMUSCULAR | Status: AC
  Administered 2021-12-22: 10 mg via INTRAVENOUS
  Filled 2021-12-22: qty 2

## 2021-12-22 MED ORDER — BISACODYL 5 MG PO TBEC
20.0000 mg | DELAYED_RELEASE_TABLET | Freq: Once | ORAL | Status: AC
  Administered 2021-12-22: 20 mg via ORAL
  Filled 2021-12-22: qty 4

## 2021-12-22 MED ORDER — GLUCERNA SHAKE PO LIQD
237.0000 mL | Freq: Three times a day (TID) | ORAL | Status: DC
  Administered 2021-12-22 – 2021-12-24 (×4): 237 mL via ORAL
  Filled 2021-12-22 (×8): qty 237

## 2021-12-22 MED ORDER — PEG-KCL-NACL-NASULF-NA ASC-C 100 G PO SOLR
0.5000 | Freq: Once | ORAL | Status: AC
  Administered 2021-12-22: 100 g via ORAL
  Filled 2021-12-22: qty 1

## 2021-12-22 NOTE — Consult Note (Addendum)
Consultation Note   Referring Provider:  [facility] PCP: [name], MD Primary Gastroenterologist: Zenovia Jarred, MD  Reason for consultation: weight loss, microcytic anemia  Hospital Day: 4  Assessment    Patient profile:  Stacy [name] is a 56 y.o. female with a past medical history significant for GERD, hiatal hernia, colon polyp, DVT on Xarelto, mild CAD, HTN,   . See PMH for any additional medical problems. Admitted 12/1 for poor appetite, severe hypoglycemia, dizziness, AKI  # Severe hypoglycemia. Felt to be 2/2 poor renal clearance of diabetic medication in setting of AKI.  # AKI, resolved  # Iron deficiency anemia.on Xarelto. She endorses intermittent dark stools. 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 Esophagitis? PUD? AVMs? Intestinal malignancy seems less likely. She did donate blood but only a few times over the last five years Hard to know true baseline hgb was 12.5 but in 2021. It was 9.2 in December 2022. No labs after that until this admission during which time hgb has been in 9-10 range except for last few days when it declined to 8.2. Ferritin 17, TIBC elevated with low iron saturation.    # Unintentional weight loss of ~ 17 pounds over the last year. Weight stable over last couple of months.  .  Unclear etiology. Metformin can cause weight loss in some people.   No nausea or  abdominal pain associated with the weight loss.  TSH 0.8 CT scan negative for malignancy  # Moderate to large hiatal hernia on imaging. It was 5 cm on EGD in Dec 2020.   # History of adenomatous colon polyps. Five year colonoscopy recommended Dec 2025 ( may  need to be done sooner given IDA)  # GERD, untreated. Gets frequent pyrosis at home.   # History of DVT. On Xarelto at home  # See PMH for additional medical  problems  -------------------------------------------------------------------------  GI Attending;  I have seen and evaluated the patient also. She has iron-def anemia  and some c/o pill > food dysphagia in setting of anorexia, ? Melena, weight loss and Ct abd/pelvis w/o major abnormalities. Hiatal hernia could be playing a role.  Plan for EGD and colonoscopy tomorrow, possible esophageal dilation.  Trurn off heparin 0800 12/23/21  The risks and benefits as well as alternatives of endoscopic procedure(s) have been discussed and reviewed. All questions answered. The patient agrees to proceed.  Gatha Mayer, MD, [facility] See AMION on call - gastroenterology for best contact person 12/22/2021 2:26 PM   Plan   Recommend dose of IV iron Given the intermittent black stool we will likely proceed with an EGD, ? Tomorrow. Hopefully colonoscopy will need be necessary but it may if EGD is negative  Xarelto is still on hold Upon discharge she should be on a PPI for frequent GERD symptoms at home   HPI   Hollyann hasn't had an appetite over the last two years. She recall a similar problem 5 years ago and was on Remeron. She doesn't have any associated abdominal pain, nausea, vomiting. She sometimes has problems swallowing pills but swallows food fine. Over the last couple of year her BP and diabetes meds  have been changed and that could be causing decreased appetite.   She endorses anxiety and situational depression. She is a widow. Holidays are particularly hard.   Teresea hasn't seen any blood in her stools. No vaginal bleeding. No epistaxis. Rarely takes ibuprofen. No other NSAIDS. She has donated blood about 3-4 times in the last five years.    Previous GI Evaluation    Dec 2020 EGD -Normal mucosa was found in the lower third of the esophagus. - 5 cm hiatal hernia. - Gastritis. Biopsied. - Normal examined duodenum.  Dec 2020 colonoscopy  - complete exam, good  prep -One 5 mm polyp at the hepatic flexure, removed with a cold snare. Resected and retrieved. - One 5 mm polyp in the descending colon, removed with a cold snare. Resected and retrieved. - One 3 mm polyp in the rectum, removed with a cold snare. Resected and retrieved. - Diverticulosis in the sigmoid colon, in the descending colon and in the ascending colon. - Small internal hemorrhoids.  Surgical [P], gastric antrum and gastric body - ANTRAL AND OXYNTIC MUCOSA WITH MILD CHRONIC INFLAMMATION. [place] NEGATIVE FOR HELICOBACTER PYLORI. - NO INTESTINAL METAPLASIA, DYSPLASIA OR MALIGNANCY. 2. Surgical [P], colon, descending and hepatic flexure, polyp (2) - TUBULAR ADENOMA(S). - NO HIGH GRADE DYSPLASIA OR MALIGNANCY. 3. Surgical [P], colon, rectum, polyp - TUBULAR ADENOMA. - NO HIGH GRADE DYSPLASIA OR MALIGNANCY    Recent Labs and Imaging CT ABDOMEN PELVIS W CONTRAST  Result Date: 12/21/2021 CLINICAL DATA:  Unintentional weight loss. Hiatal hernia. Clinical suspicion for malignancy. EXAM: CT ABDOMEN AND PELVIS WITH CONTRAST TECHNIQUE: Multidetector CT imaging of the abdomen and pelvis was performed using the standard protocol following bolus administration of intravenous contrast. RADIATION DOSE REDUCTION: This exam was performed according to the departmental dose-optimization program which includes automated exposure control, adjustment of the mA and/or kV according to patient size and/or use of iterative reconstruction technique. CONTRAST:  76m OMNIPAQUE IOHEXOL 300 MG/ML  SOLN COMPARISON:  02/16/2018 FINDINGS: Lower Chest: No acute findings. Hepatobiliary: No hepatic masses identified. Gallbladder is unremarkable. No evidence of biliary ductal dilatation. Pancreas:  No mass or inflammatory changes. Spleen: Within normal limits in size and appearance. Adrenals/Urinary Tract: 3.2 x 2.9 cm right adrenal mass remains stable, consistent with benign adenoma (no followup imaging is  recommended). No suspicious renal masses identified. No evidence of ureteral calculi or hydronephrosis. Stomach/Bowel: Moderate to large hiatal hernia is again seen. No evidence of obstruction, inflammatory process or abnormal fluid collections. Normal appendix visualized. Mild colonic diverticulosis is noted, however there is no evidence of diverticulitis. Vascular/Lymphatic: No pathologically enlarged lymph nodes. No acute vascular findings. Aortic atherosclerotic calcification incidentally noted. Reproductive: 1 cm calcified uterine fibroid remains stable. Adnexal regions are unremarkable. Other:  None. Musculoskeletal:  No suspicious bone lesions identified. IMPRESSION: No acute findings. No evidence of malignancy. Stable moderate to large hiatal hernia. Stable benign right adrenal adenoma (no followup imaging is recommended) . Stable small calcified uterine fibroid. Aortic Atherosclerosis (ICD10-I70.0). Electronically Signed   By: JMarlaine HindM.D.   On: 12/21/2021 16:49    Labs:  Recent Labs    12/20/21 0735 12/21/21 0548 12/22/21 0427  WBC 8.9 8.3 7.9  HGB 9.4* 8.6* 8.2*  HCT 30.5* 28.0* 26.9*  PLT 347 305 319   Recent Labs    12/20/21 0735 12/21/21 0548 12/22/21 0427  NA 134* 140 140  K 4.5 4.3 4.1  CL 101 109 112*  CO2 '26 25 23  '$ GLUCOSE 154* 113*  115*  BUN 57* 33* 18  CREATININE 1.42* 0.99 0.76  CALCIUM 9.3 8.5* 8.1*   Recent Labs    12/20/21 0735 12/21/21 0548 12/22/21 0427  PROT 7.2   < > 6.3*  ALBUMIN 3.3*   < > 2.9*  AST 14*   < > 20  ALT 12   < > 19  ALKPHOS 54   < > 43  BILITOT 0.2*   < > 0.3  BILIDIR 0.1  --   --   IBILI 0.1*  --   --    < > = values in this interval not displayed.   No results for input(s): "HEPBSAG", "HCVAB", "HEPAIGM", "HEPBIGM" in the last 72 hours. No results for input(s): "LABPROT", "INR" in the last 72 hours.  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 Posey Pronto 02/27/2014   Tobacco abuse    Tubular adenoma of colon     Past Surgical History:  Procedure Laterality Date   ANAL FISTULECTOMY  01/19/1985   BREAST EXCISIONAL BIOPSY Left    BREAST LUMPECTOMY Left 1988   PAROTIDECTOMY  03/30/2011   Procedure: PAROTIDECTOMY;  Surgeon: Izora Gala, MD;  Location: [facility];  Service: ENT;  Laterality: Right;   WRIST GANGLION EXCISION Left 1988    Family History  Problem Relation Age of Onset   Coronary artery disease Father 26       CAD,CABG   Hypertension Mother    Stroke Mother    Diabetes Sister        uncontrolled   Cancer Sister 108       uterine cancer   Aneurysm Sister        brain   Heart attack Maternal Grandmother    Blindness Paternal Grandmother    Breast cancer Neg Hx    Colon cancer Neg Hx    Esophageal cancer Neg Hx    Rectal cancer Neg Hx    Stomach cancer Neg Hx     Prior to Admission medications   Medication Sig Start Date End Date Taking? Authorizing Provider  albuterol (VENTOLIN HFA) 108 (90 Base) MCG/ACT inhaler Inhale 2 puffs into the lungs every 4 (four) hours as needed for wheezing or shortness of breath. 04/07/21  Yes [name], MD  allopurinol (ZYLOPRIM) 300 MG tablet Take 1 tablet (300 mg total) by mouth daily. 06/12/21  Yes [name], MD   amLODipine (NORVASC) 10 MG tablet Take 10 mg by mouth daily. 12/01/21  Yes [provider]  colchicine 0.6 MG tablet Take 1 tablet (0.6 mg total)  by mouth 2 (two) times daily as needed (gout flare). 10/28/21  Yes [name], MD  EPINEPHrine 0.3 mg/0.3 mL IJ SOAJ injection Insert into muscle once with any facial or neck swelling  Instruct in use 01/25/17  Yes [place], Modena Nunnery, MD  escitalopram (LEXAPRO) 10 MG tablet Take 1 tablet (10 mg total) by mouth daily. 08/14/21  Yes [name], MD  folic acid (FOLVITE) 1 MG tablet Take 1 tablet (1 mg total) by mouth daily. 06/12/21  Yes [name], MD  glimepiride (AMARYL) 1 MG tablet TAKE 1 TABLET BY MOUTH DAILY BEFORE SUPPER. 08/14/21  Yes [name], MD  metFORMIN (GLUCOPHAGE-XR) 500 MG 24 hr tablet Take 2 tablets (1,000 mg total) by mouth daily with supper. 08/14/21  Yes [name], MD  pioglitazone (ACTOS) 15 MG tablet Take 1 tablet (15 mg total) by mouth daily. 04/07/21  Yes [name], MD  spironolactone (ALDACTONE) 25 MG tablet Take 1 tablet (25 mg total) by mouth daily. 06/12/21 12/19/21 Yes [name], MD  traZODone (DESYREL) 100 MG tablet TAKE 1 TO 2 TABLETS BY MOUTH AS NEEDED FOR SLEEP Patient taking differently: Take 200 mg by mouth at bedtime. 08/13/21  Yes [name], MD  valsartan-hydrochlorothiazide (DIOVAN-HCT) 320-12.5 MG tablet Take 1 tablet by mouth daily. 11/13/21  Yes Skeet Latch, MD  XARELTO 20 MG TABS tablet Take 20 mg by mouth every evening. 11/25/21  Yes [provider]  amLODipine (NORVASC) 5 MG tablet Take 1 tablet (5 mg total) by mouth daily. Patient not taking: Reported on 12/19/2021 08/22/21   [name], MD  glucose blood (FREESTYLE PRECISION NEO TEST) test strip Use as instructed 02/04/21   [name], MD  Lancets (FREESTYLE) lancets Use as instructed 02/04/21   [name], MD  nicotine (NICOTINE STEP 1) 21 mg/24hr patch Place 1 patch (21  mg total) onto the skin daily. Patient not taking: Reported on 12/19/2021 02/04/21   [name], MD  nicotine polacrilex (NICOTINE MINI) 2 MG lozenge Use three times a day as needed for smoking cessation Patient not taking: Reported on 12/19/2021 02/04/21   [name], MD  Lancets MISC Use as instructed to monitor FSBS 3x daily. Dx: E11.65 01/03/15   Alycia Rossetti, MD    Current Facility-Administered Medications  Medication Dose Route Frequency Provider Last Rate Last Admin   0.9 %  sodium chloride infusion  250 mL Intravenous PRN Sherwood Gambler, MD       0.9 %  sodium chloride infusion   Intravenous Continuous Raiford Noble [place], DO 75 mL/hr at 12/22/21 0041 Infusion Verify at 12/22/21 0041   allopurinol (ZYLOPRIM) tablet 300 mg  300 mg Oral Daily Raiford Noble [place], DO   300 mg at 12/22/21 1014   alum & mag hydroxide-simeth (MAALOX/MYLANTA) 200-200-20 MG/5ML suspension 30 mL  30 mL Oral Q6H PRN Tu, Ching T, DO   30 mL at 12/21/21 2113   amLODipine (NORVASC) tablet 10 mg  10 mg Oral Daily Tu, Ching T, DO   10 mg at 12/22/21 1014   Chlorhexidine Gluconate Cloth 2 % PADS 6 each  6 each Topical Daily Reubin Milan, MD   6 each at 12/22/21 1014   colchicine tablet 0.3 mg  0.3 mg Oral BID Raiford Noble Latif, DO   0.3 mg at 12/22/21 1014   escitalopram (LEXAPRO) tablet 10 mg  10 mg Oral Daily Tu, Ching T, DO  10 mg at 12/22/21 1013   feeding supplement (GLUCERNA SHAKE) (GLUCERNA SHAKE) liquid 237 mL  237 mL Oral TID BM Sheikh, Omair Latif, DO       heparin ADULT infusion 100 units/mL (25000 units/274m)  550 Units/hr Intravenous Continuous WEmiliano Dyer RPH 5.5 mL/hr at 12/22/21 [id number] Units/hr at 12/22/21 0643   LORazepam (ATIVAN) tablet 1 mg  1 mg Oral Q6H PRN SRaiford NobleLatif, DO   1 mg at 12/21/21 2113   multivitamin with minerals tablet 1 tablet  1 tablet Oral Daily SRaiford NobleLHartford [place]      Oral care mouth rinse  15 mL Mouth Rinse PRN SAlfredia Ferguson OGeorgina QuintLatif,  DO       pantoprazole (PROTONIX) EC tablet 40 mg  40 mg Oral Daily SRaiford NobleLFremont DO   40 mg at 12/22/21 1013   phosphorus (K PHOS NEUTRAL) tablet 500 mg  500 mg Oral BID Sheikh, Omair Latif, DO       sodium chloride flush (NS) 0.9 % injection 3 mL  3 mL Intravenous Q12H GSherwood Gambler MD   3 mL at 12/21/21 2123   sodium chloride flush (NS) 0.9 % injection 3 mL  3 mL Intravenous PRN GSherwood Gambler MD       traZODone (DESYREL) tablet 200 mg  200 mg Oral QHS Tu, Ching T, DO   200 mg at 12/21/21 2113    Allergies as of 12/19/2021 - Review Complete 12/19/2021  Allergen Reaction Noted   Almond meal Anaphylaxis and Swelling 10/03/2013   Other Anaphylaxis 03/29/2019   Aspirin Nausea Only 03/24/2011    Social History   Socioeconomic History   Marital status: Widowed    Spouse name: Not on file   Number of children: 2   Years of education: Not on file   Highest education level: Not on file  Occupational History    Employer: [employer] Tobacco Use   Smoking status: Every Day    Packs/day: 1.00    Years: 28.00    Total pack years: 28.00    Types: Cigarettes   Smokeless tobacco: Never  Vaping Use   Vaping Use: Never used  Substance and Sexual Activity   Alcohol use: Yes    Alcohol/week: 14.0 standard drinks of alcohol    Types: 14 Standard drinks or equivalent per week   Drug use: No   Sexual activity: Not Currently    Birth control/protection: Post-menopausal  Other Topics Concern   Not on file  Social History Narrative   Married. 2 children.    Ages [age]y/o ( entered 09/2012)      Works with [employer]  Social Determinants of Health   Financial Resource Strain: Not on file  Food Insecurity: No Food Insecurity (12/20/2021)   Hunger Vital Sign    Worried About Running Out of Food in the Last Year: Never true    Ran Out of Food in the Last Year: Never true  Transportation Needs: No Transportation Needs (12/20/2021)    PRAPARE - THydrologist(Medical): No    Lack of Transportation (Non-Medical): No  Physical Activity: Not on file  Stress: Not on file  Social Connections: Not on file  Intimate Partner Violence: Not At Risk (12/20/2021)   Humiliation, Afraid, Rape, and Kick questionnaire    Fear of Current or Ex-Partner: No    Emotionally Abused: No    Physically Abused: No  Sexually Abused: No    Review of Systems: All systems reviewed and negative except where noted in HPI.  Physical Exam: Vital signs in last 24 hours: Temp:  [98.1 F (36.7 C)-98.8 F (37.1 C)] 98.7 F (37.1 C) (12/04 0521) Pulse Rate:  [84-91] 91 (12/04 0521) Resp:  [17] 17 (12/04 0521) BP: (100-129)/(66-79) 129/79 (12/04 0521) SpO2:  [96 %-98 %] 96 % (12/04 0521) Last BM Date : 12/20/21  General:  Alert female in NAD Psych:  Pleasant, cooperative. Normal mood and affect Eyes: Pupils equal Ears:  Normal auditory acuity Nose: No deformity, discharge or lesions Neck:  Supple, no masses felt Lungs:  Clear to auscultation.  Heart:  Regular rate, regular rhythm. No lower extremity edema Abdomen:  Soft, nondistended, nontender, active bowel sounds, no masses felt Rectal :  Deferred Msk: Symmetrical without gross deformities.  Neurologic:  Alert, oriented, grossly normal neurologically Skin:  Intact without significant lesions.    Intake/Output from previous day: 12/03 0701 - 12/04 0700 In: 1998.6 [P.O.:480; I.V.:1468.6; IV Piggyback:50] Out: -  Intake/Output this shift:  No intake/output data recorded.    Principal Problem:   Hypoglycemia Active Problems:   Hypertension   GERD (gastroesophageal reflux disease)   Type 2 diabetes mellitus with other specified complication (HCC)   Acute renal failure ([place])   History of DVT (deep vein thrombosis)    Tye Savoy, NP-C @  12/22/2021, 12:32 PM

## 2021-12-22 NOTE — Progress Notes (Signed)
ANTICOAGULATION CONSULT NOTE   Pharmacy Consult for heparin Indication: VTE treatment  Allergies  Allergen Reactions   Almond Meal Anaphylaxis and Swelling   Other Anaphylaxis    Almond Butter   Aspirin Nausea Only    Other Reaction(s): Not available, Unknown    Patient Measurements: Height: '5\' 3"'$  (160 cm) Weight: 59.1 kg (130 lb 4.7 oz) IBW/kg (Calculated) : 52.4 Heparin Dosing Weight: 59.1 kg  Vital Signs: Temp: 98.7 F (37.1 C) (12/04 0521) Temp Source: Oral (12/04 0521) BP: 129/79 (12/04 0521) Pulse Rate: 91 (12/04 0521)  Labs: Recent Labs    12/20/21 0735 12/20/21 1600 12/21/21 0548 12/21/21 0857 12/21/21 1857 12/22/21 0427  HGB 9.4*  --  8.6*  --   --  8.2*  HCT 30.5*  --  28.0*  --   --  26.9*  PLT 347  --  305  --   --  319  APTT 67*   < > 186* 199* 120* 58*  HEPARINUNFRC 1.10*  --  >1.10* >1.10*  --  0.43  CREATININE 1.42*  --  0.99  --   --  0.76   < > = values in this interval not displayed.     Estimated Creatinine Clearance: 64.2 mL/min (by C-G formula based on SCr of 0.76 mg/dL).  Assessment: 56 yo female with abnormal labs,  hx of mild CAD, HTN, HLD, fatty liver disease, elevated LFTs, tobacco use, on xarelto for hx of DVT.  Due to AKI pharmacy consulted to dose heparin drip. LD xarelto 11/30 @ 2100  Today, 12/22/21 -HL 0.43 therapeutic on 550 units/hr -Hgb down 8.2 and PLT WNL -No bleeding or infusion issues per RN  Goal of Therapy:  Heparin level 0.3-0.7 units/ml aPTT 66-102 seconds Monitor platelets by anticoagulation protocol: Yes   Plan:  - continue heparin drip at 550 units/hr - confirmatory heparin level in 6 hours - will use heparin levels only from here forward - heparin level and CBC -Monitor for signs/symptoms of bleeding   Dolly Rias RPh 12/22/2021, 5:30 AM

## 2021-12-22 NOTE — Progress Notes (Signed)
ANTICOAGULATION CONSULT NOTE   Pharmacy Consult for heparin Indication: VTE treatment  Allergies  Allergen Reactions   Almond Meal Anaphylaxis and Swelling   Other Anaphylaxis    Almond Butter   Aspirin Nausea Only    Other Reaction(s): Not available, Unknown    Patient Measurements: Height: '5\' 3"'$  (160 cm) Weight: 59.1 kg (130 lb 4.7 oz) IBW/kg (Calculated) : 52.4 Heparin Dosing Weight: 59.1 kg  Vital Signs: Temp: 98.7 F (37.1 C) (12/04 0521) Temp Source: Oral (12/04 0521) BP: 129/79 (12/04 0521) Pulse Rate: 91 (12/04 0521)  Labs: Recent Labs    12/20/21 0735 12/20/21 1600 12/21/21 0548 12/21/21 0857 12/21/21 1857 12/22/21 0427 12/22/21 1050  HGB 9.4*  --  8.6*  --   --  8.2*  --   HCT 30.5*  --  28.0*  --   --  26.9*  --   PLT 347  --  305  --   --  319  --   APTT 67*   < > 186* 199* 120* 58*  --   HEPARINUNFRC 1.10*  --  >1.10* >1.10*  --  0.43 0.47  CREATININE 1.42*  --  0.99  --   --  0.76  --    < > = values in this interval not displayed.     Estimated Creatinine Clearance: 64.2 mL/min (by C-G formula based on SCr of 0.76 mg/dL).  Assessment: 56 yo female with abnormal labs, hx of mild CAD, HTN, HLD, fatty liver disease, elevated LFTs, tobacco use, on xarelto for hx of DVT.  Due to AKI pharmacy consulted to dose heparin drip. LD xarelto 11/30 @ 2100  Today, 12/22/21 Confirmatory heparin level remains therapeutic on 550 units/hr Hgb down 1.5g since admission (likely dilutional); PLT stable WNL No bleeding or infusion issues per RN Seen by GI; with iron deficiency and reported intermittent dark stool at home, planning on EGD this admission  Goal of Therapy:  Heparin level 0.3-0.7 units/ml Monitor platelets by anticoagulation protocol: Yes   Plan:  Continue heparin drip at 550 units/hr Daily heparin level and CBC Monitor for signs/symptoms of bleeding F/u GI plans for transition back to [place], PharmD,  BCPS [phone] 12/22/2021, 11:56 AM

## 2021-12-22 NOTE — Progress Notes (Signed)
PROGRESS NOTE    Stacy [name]  UVO:[id number] DOB: 05/18/1965 DOA: 12/19/2021 PCP: [name], MD   Brief Narrative:  The patient is a 56 year old African-American female with a past medical history significant for but not limited to CAD, hypertension, hyperlipidemia, renal artery stenosis, ongoing tobacco use, DVT as well as other comorbidities who presented with abnormal labs. She has been having poor appetite for the last year or so and has worsened last several months and has lost about 25 pounds. For the past month significantly dizzy and unsteady intermittently when she first gets out of bed. She went to get her labs done outpatient was found to have an acute renal failure and was advised to go to the ED. In the ED her glucose was noted to be down to 18. Of note she has been recently started on new ACE hydrochlorothiazide combination for the past week and a half and has occasionally taken Advil for headache as well as taking metformin, Actos and glimepiride for diabetes but is not on any insulin. In the ED she was having a temperature of 97.8 was normotensive on room air but she was found to be hypoglycemic in the 30s with acute renal failure and creatinine was noted to be 3.40 up from her prior creatinine of normal of 0.63. She was evaluated for her fatigue and admitted for hypoglycemia as well as acute kidney injury. She is getting IV fluid hydration and was placed on D10 drip overnight and this is now stopped and she is now changed to a normal saline at 75 MLS per hour.     Assessment and Plan: * Hypoglycemia, improved -Suspect due to poor renal clearance of her antidiabetic medication of glimepiride in the setting of acute renal failure -Continue to hold all hold Diabetes Medication  -Continuous IV D10 has now stopped and will be changed to Normal Saline at 75 MLS per hour -CBGs changed from every 4 to before meals and at bedtime now -Monitor CBGs per protocol and will check  hemoglobin A1c in a.m; Last HbA1c was 6.6 but given her weight loss she may not need as strong of a regimen  -Blood sugars now ranging from 109-162   History of DVT (deep vein thrombosis) -Venous doppler on 07/2021 showed DVT of right popliteal vein. -Will need to hold Xarelto due to acute renal failure and place on IV heparin infusion continue for now and once renal function improves changed back to Xarelto once her procedures are over   Acute renal failure (HCC), improving  -Creatinine up to 3.40 from 0.63 baseline; patient's BUNs/creatinine is now slowly improving with IV fluid hydration and now gone from 59/3.15 -> 84/3.40 -> 57/1.42 -> 33/0.99 -> 18/0.76 -Suspect a combination of dehydration, being on ACE-diuretic antihypertensive and occasional NSAIDS use. No symptoms concerning for post-obstructive process. Could consider renal ultrasound if renal function not improved with fluids overnight -Has received LR bolus in ED. Currently was on D10 IV and will change to normal saline at 75 MLS per hour continuous fluid for her concomitant hypoglycemia.   -Will need to hold Nephrotoxic medications, contrast dyes if possible, hypotension and dehydration to ensure adequate renal perfusion -Repeat CMP in the AM    GERD (gastroesophageal reflux disease)/GI Prophylaxis  -C/w PPI and tums x 1: GI recommending upon discharge that she should be on a PPI for frequent GERD symptoms at home  Moderate to large hiatal hernia -GI involved and planning on EGD and colonoscopy in house 5 cm  on EGD in December 2020 -Case was discussed with surgery team who feels that they do not need to do any intervention at this point and referring to GI if necessary they are recommending having the GI physician and team referring her for surgical opinion and evaluation in the outpatient setting   Hypertension -Continue Amlodipine 10 mg p.o. daily -Continue to hold valsartan-HCTZ due to ARF -Continue to monitor blood pressures  per protocol  -Last blood pressure reading was 112/75   Hyponatremia -Mild. -Patient's Sodium went from 136 -> 133 -> 134 -> 140 x2 -Will stop NS at 75 mL/hr and Repeat CMP in the AM    Iron deficiency and microcytic Anemia -Patient's hemoglobin/hematocrit went from 9.7/31.1 -> 9.4/30.5 -> 8.6/28.0 -> 8.2/26.9 with an MCV of 75.1 -Checked Anemia panel showed an iron level of 22, UIBC 508, TIBC of 530, saturation ratios of 4%, ferritin level 17, vitamin B12 42  -GI recommending transfusing IV iron Continue to monitor for signs and symptoms and; no overt bleeding noted but she has reported that she has had intermittent black stools and GI feels that she may have developed [name] lesions associated with her moderate to large hiatal hernia seen on CT versus esophagitis or peptic ulcer disease or AVMs.  They feel that intestinal malignancy seems less likely -GI consulted and planning for an EGD and colonoscopy -Repeat CBC in a.m.   Unintentional Weight Loss -Patient states that she has lost about 25 pounds in the last few months and she states that she does not have a very good appetite -Check TSH and first time was 1.247 and repeat was 0.808 -Chest x-ray done and showed "No acute cardiopulmonary disease. Moderate to large hiatal hernia potentially larger than on previous imaging." -Will obtain CT scan of the abdomen pelvis with contrast for further evaluation now that she has further improvement in Renal Fxn  -CT Scan Abd/Pelvis done and showed "No acute findings. No evidence of malignancy. Stable moderate to large hiatal hernia. Stable benign right adrenal adenoma (no followup imaging is recommended).Stable small calcified uterine fibroid. Aortic Atherosclerosis." -Since she is having trouble swallowing so we will obtain a SLP evaluation -Will consult nutrition for further evaluation recommendations and they are adding Glucerna shakes p.o. 3 times daily, multivitamin with minerals daily as well  as checking vitamin D labs given her poor p.o. -Given concerns GI has been consulted and planning on doing an EGD and colonoscopy in the morning and they feel that metformin can cause also weight loss some improvement she denies any nausea or abdominal pain associated with the weight loss  Dysphagia -SLP to evaluate and will follow up after EGD -GI planning on EGD in the AM    Depression and Anxiety -Continue with Escitalopram 10 mg p.o. daily as well as Trazodone 200 mg p.o. nightly   Hypoalbuminemia -Patient's Albumin level is now 3.3 -> 3.0 yesterday and today is now 2.9 -Continue to monitor and trend and repeat CMP in a.m.   Hypophosphatemia -Patient's Phos Level is now 2.0 -Replete with po K Phos Neutral 500 mg x1 -Continue to Monitor and Replete as Necessary -Repeat Phos Level in the AM   Gout, improving  -Has Gout of the Big Toe and thinks she is having a flare  -Start Prednisone 40 mg po Daily and Resume Allopurinol 300 mg   DVT prophylaxis: Anticoagulated with Heparin gtt    Code Status: Full Code Family Communication: No family present at bedside   Disposition Plan:  Level  of care: Telemetry Status is: Inpatient Remains inpatient appropriate because: Is undergoing EGD and colonoscopy in the morning   Consultants:  Gastroenterology   Procedures:  As delineated as above  Antimicrobials:  Anti-infectives (From admission, onward)    None       Subjective: Seen and examined at bedside and states she is doing better.  She denies any chest pain or shortness of breath.  As indicated that she is having trouble swallowing some pills.  Also having frequent reflux.  Also endorses some intermittent dark stools.  No nausea or vomiting.  Feeling okay.  No other concerns or points this time.  Objective: Vitals:   12/21/21 1354 12/21/21 2137 12/22/21 0521 12/22/21 1346  BP: 116/79 100/66 129/79 112/75  Pulse: 88 84 91 88  Resp: _0 Temp: 98.1 F (36.7 C)  98.8 F (37.1 C) 98.7 F (37.1 C) 98.6 F (37 C)  TempSrc: Oral Oral Oral Oral  SpO2: 97% 98% 96% 99%  Weight:      Height:        Intake/Output Summary (Last 24 hours) at 12/22/2021 1600 Last data filed at 12/22/2021 1346 Gross per 24 hour  Intake 1998.63 ml  Output --  Net 1998.63 ml   Filed Weights   12/19/21 1146 12/19/21 1900  Weight: 59.9 kg 59.1 kg   Examination: Physical Exam:  Constitutional: African-American female, in no acute distress appears calm Respiratory: Diminished to auscultation bilaterally with coarse breath sounds, no wheezing, rales, rhonchi or crackles. Normal respiratory effort and patient is not tachypenic. No accessory muscle use.  Unlabored breathing Cardiovascular: RRR, no murmurs / rubs / gallops. S1 and S2 auscultated.  Abdomen: Soft, non-tender, non-distended. Bowel sounds positive.  GU: Deferred. Musculoskeletal: No clubbing / cyanosis of digits/nails. No joint deformity upper and lower extremities.  Skin: No rashes, lesions, ulcers on limited skin evaluation. No induration; Warm and dry.  Neurologic: CN 2-12 grossly intact with no focal deficits. Romberg sign and cerebellar reflexes not assessed.  Psychiatric: Normal judgment and insight. Alert and oriented x 3. Normal mood and appropriate affect.   Data Reviewed: I have personally reviewed following labs and imaging studies  CBC: Recent Labs  Lab 12/19/21 1200 12/20/21 0735 12/21/21 0548 12/22/21 0427  WBC 10.4 8.9 8.3 7.9  NEUTROABS  --   --  4.2 3.9  HGB 9.7* 9.4* 8.6* 8.2*  HCT 31.1* 30.5* 28.0* 26.9*  MCV 72.3* 73.3* 74.5* 75.1*  PLT 365 347 305 161   Basic Metabolic Panel: Recent Labs  Lab 12/18/21 1441 12/19/21 1200 12/20/21 0735 12/20/21 0825 12/21/21 0548 12/22/21 0427  NA 136 133* 134*  --  140 140  K 5.6* 4.8 4.5  --  4.3 4.1  CL 96 99 101  --  109 112*  CO2 _1 --  25 23  GLUCOSE 50* 30* 154*  --  113* 115*  BUN 59* 84* 57*  --  33* 18  CREATININE 3.15*  3.40* 1.42*  --  0.99 0.76  CALCIUM 10.9* 10.4* 9.3  --  8.5* 8.1*  MG  --   --   --  2.0 1.7 1.7  PHOS  --   --   --  2.6 2.1* 2.0*   GFR: Estimated Creatinine Clearance: 64.2 mL/min (by C-G formula based on SCr of 0.76 mg/dL). Liver Function Tests: Recent Labs  Lab 12/20/21 0735 12/21/21 0548 12/22/21 0427  AST 14* 20 20  ALT _2 ALKPHOS  54 48 43  BILITOT 0.2* 0.2* 0.3  PROT 7.2 6.5 6.3*  ALBUMIN 3.3* 3.0* 2.9*   No results for input(s): "LIPASE", "AMYLASE" in the last 168 hours. No results for input(s): "AMMONIA" in the last 168 hours. Coagulation Profile: No results for input(s): "INR", "PROTIME" in the last 168 hours. Cardiac Enzymes: No results for input(s): "CKTOTAL", "CKMB", "CKMBINDEX", "TROPONINI" in the last 168 hours. BNP (last 3 results) No results for input(s): "PROBNP" in the last 8760 hours. HbA1C: No results for input(s): "HGBA1C" in the last 72 hours. CBG: Recent Labs  Lab 12/21/21 1132 12/21/21 1629 12/21/21 2139 12/22/21 0819 12/22/21 1123  GLUCAP 115* 109* 162* 120* 137*   Lipid Profile: No results for input(s): "CHOL", "HDL", "LDLCALC", "TRIG", "CHOLHDL", "LDLDIRECT" in the last 72 hours. Thyroid Function Tests: Recent Labs    12/21/21 0548  TSH 0.808   Anemia Panel: Recent Labs    12/21/21 0548  VITAMINB12 423  FOLATE 18.5  FERRITIN 17  TIBC 530*  IRON 22*  RETICCTPCT 1.6   Sepsis Labs: No results for input(s): "PROCALCITON", "LATICACIDVEN" in the last 168 hours.  Recent Results (from the past 240 hour(s))  MRSA Next Gen by PCR, Nasal     Status: None   Collection Time: 12/19/21  7:03 PM   Specimen: Nasal Mucosa; Nasal Swab  Result Value Ref Range Status   MRSA by PCR Next Gen NOT DETECTED NOT DETECTED Final    Comment: (NOTE) The GeneXpert MRSA Assay (FDA approved for NASAL specimens only), is one component of a comprehensive MRSA colonization surveillance program. It is not intended to diagnose MRSA infection nor to  guide or monitor treatment for MRSA infections. Test performance is not FDA approved in patients less than 37 years old. Performed at [facility], [place] [address] [place], [place] [address]      Radiology Studies: CT ABDOMEN PELVIS W CONTRAST  Result Date: 12/21/2021 CLINICAL DATA:  Unintentional weight loss. Hiatal hernia. Clinical suspicion for malignancy. EXAM: CT ABDOMEN AND PELVIS WITH CONTRAST TECHNIQUE: Multidetector CT imaging of the abdomen and pelvis was performed using the standard protocol following bolus administration of intravenous contrast. RADIATION DOSE REDUCTION: This exam was performed according to the departmental dose-optimization program which includes automated exposure control, adjustment of the mA and/or kV according to patient size and/or use of iterative reconstruction technique. CONTRAST:  52m OMNIPAQUE IOHEXOL 300 MG/ML  SOLN COMPARISON:  02/16/2018 FINDINGS: Lower Chest: No acute findings. Hepatobiliary: No hepatic masses identified. Gallbladder is unremarkable. No evidence of biliary ductal dilatation. Pancreas:  No mass or inflammatory changes. Spleen: Within normal limits in size and appearance. Adrenals/Urinary Tract: 3.2 x 2.9 cm right adrenal mass remains stable, consistent with benign adenoma (no followup imaging is recommended). No suspicious renal masses identified. No evidence of ureteral calculi or hydronephrosis. Stomach/Bowel: Moderate to large hiatal hernia is again seen. No evidence of obstruction, inflammatory process or abnormal fluid collections. Normal appendix visualized. Mild colonic diverticulosis is noted, however there is no evidence of diverticulitis. Vascular/Lymphatic: No pathologically enlarged lymph nodes. No acute vascular findings. Aortic atherosclerotic calcification incidentally noted. Reproductive: 1 cm calcified uterine fibroid remains stable. Adnexal regions are unremarkable. Other:  None. Musculoskeletal:  No  suspicious bone lesions identified. IMPRESSION: No acute findings. No evidence of malignancy. Stable moderate to large hiatal hernia. Stable benign right adrenal adenoma (no followup imaging is recommended) . Stable small calcified uterine fibroid. Aortic Atherosclerosis (ICD10-I70.0). Electronically Signed   By: JMyles RosenthalD.  On: 12/21/2021 16:49    Scheduled Meds:  allopurinol  300 mg Oral Daily   amLODipine  10 mg Oral Daily   Chlorhexidine Gluconate Cloth  6 each Topical Daily   colchicine  0.3 mg Oral BID   escitalopram  10 mg Oral Daily   feeding supplement (GLUCERNA SHAKE)  237 mL Oral TID BM   metoCLOPramide (REGLAN) injection  10 mg Intravenous Once   multivitamin with minerals  1 tablet Oral Daily   pantoprazole  40 mg Oral Daily   peg 3350 powder  0.5 kit Oral Once   And   peg 3350 powder  0.5 kit Oral Once   phosphorus  500 mg Oral BID   sodium chloride flush  3 mL Intravenous Q12H   traZODone  200 mg Oral QHS   Continuous Infusions:  sodium chloride     sodium chloride 75 mL/hr at 12/22/21 0041   heparin 550 Units/hr (12/22/21 0643)    LOS: 2 days   Raiford Noble, DO [facility] Available via Epic secure chat 7am-7pm After these hours, please refer to coverage provider listed on [email or web address] 12/22/2021, 4:00 PM

## 2021-12-22 NOTE — Progress Notes (Signed)
SLP Cancellation Note  Patient Details Name: Stacy [name] MRN: [id number] DOB: 08-Aug-1965   Cancelled treatment:       Reason Eval/Treat Not Completed: Other (comment) (Patient being followed by GI and planned EGD next date. SLP will plan to f/u after EGD as patient's dysphagia appears primarily esophageal in nature.)  Sonia Baller, MA, CCC-SLP Speech Therapy

## 2021-12-22 NOTE — Progress Notes (Signed)
Initial Nutrition Assessment  INTERVENTION:   -Glucerna Shake po TID, each supplement provides 220 kcal and 10 grams of protein   -Multivitamin with minerals daily  -Checking Vitamin D labs given poor PO   NUTRITION DIAGNOSIS:   Inadequate oral intake related to poor appetite as evidenced by per patient/family report.  GOAL:   Patient will meet greater than or equal to 90% of their needs  MONITOR:   PO intake, Supplement acceptance, Labs, I & O's, Weight trends  REASON FOR ASSESSMENT:   Malnutrition Screening Tool    ASSESSMENT:   56 year old African-American female with a past medical history significant for but not limited to CAD, hypertension, hyperlipidemia, renal artery stenosis, ongoing tobacco use, DVT as well as other comorbidities who presented with abnormal labs.  Patient reports poor appetite that has lasted years. States she was diagnosed with diabetes 5 years ago and this made her fearful to eat as she didn't want to eat the wrong things so she wouldn't eat at all. Pt tries to eat regardless of appetite, but will make a meal of a banana with some milk. Pt states she finds it hard to cook for one, doesn't have a lot of energy to make food for herself. Reports she plans to start pilates in the new year so hopeful this will spike her appetite. Pt states at one point she was on an appetite stimulant. Has drank Glucerna shakes in the past, agreeable to receiving supplements here.  Reports bland tastes with food. Was told to take thiamine, wonders if she should take Vitamin D, wanted levels checked.  Will start on daily MVI. Currently consuming 75-100% of meals.   Per weight records, pt has had insignificant weight loss.   Medications: K-Phos, IV Mg sulfate  Labs reviewed: CBGs: 109-162 Low Phos   NUTRITION - FOCUSED PHYSICAL EXAM:  Flowsheet Row Most Recent Value  Orbital Region No depletion  Upper Arm Region No depletion  Thoracic and Lumbar Region No  depletion  Buccal Region No depletion  Temple Region No depletion  Clavicle Bone Region No depletion  Clavicle and Acromion Bone Region No depletion  Scapular Bone Region No depletion  Dorsal Hand No depletion  Patellar Region Mild depletion  Anterior Thigh Region Mild depletion  Posterior Calf Region Mild depletion  Edema (RD Assessment) None  Hair Reviewed  Eyes Reviewed  Mouth Reviewed  Skin Reviewed  Nails Reviewed       Diet Order:   Diet Order             Diet regular Room service appropriate? Yes; Fluid consistency: Thin  Diet effective now                   EDUCATION NEEDS:   Education needs have been addressed  Skin:  Skin Assessment: Reviewed RN Assessment  Last BM:  12/2  Height:   Ht Readings from Last 1 Encounters:  12/19/21 '5\' 3"'$  (1.6 m)    Weight:   Wt Readings from Last 1 Encounters:  12/19/21 59.1 kg    BMI:  Body mass index is 23.08 kg/m.  Estimated Nutritional Needs:   Kcal:  1500-1700  Protein:  75-85g  Fluid:  1.7L/day  Clayton Bibles, MS, RD, LDN Inpatient Clinical Dietitian Contact information available via Amion

## 2021-12-22 NOTE — H&P (View-Only) (Signed)
Consultation Note   Referring Provider:  [facility] PCP: [name], MD Primary Gastroenterologist: Zenovia Jarred, MD  Reason for consultation: weight loss, microcytic anemia  Hospital Day: 4  Assessment    Patient profile:  Stacy [name] is a 56 y.o. female with a past medical history significant for GERD, hiatal hernia, colon polyp, DVT on Xarelto, mild CAD, HTN,   . See PMH for any additional medical problems. Admitted 12/1 for poor appetite, severe hypoglycemia, dizziness, AKI  # Severe hypoglycemia. Felt to be 2/2 poor renal clearance of diabetic medication in setting of AKI.  # AKI, resolved  # Iron deficiency anemia.on Xarelto. She endorses intermittent dark stools. 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 Esophagitis? PUD? AVMs? Intestinal malignancy seems less likely. She did donate blood but only a few times over the last five years Hard to know true baseline hgb was 12.5 but in 2021. It was 9.2 in December 2022. No labs after that until this admission during which time hgb has been in 9-10 range except for last few days when it declined to 8.2. Ferritin 17, TIBC elevated with low iron saturation.    # Unintentional weight loss of ~ 17 pounds over the last year. Weight stable over last couple of months.  .  Unclear etiology. Metformin can cause weight loss in some people.   No nausea or  abdominal pain associated with the weight loss.  TSH 0.8 CT scan negative for malignancy  # Moderate to large hiatal hernia on imaging. It was 5 cm on EGD in Dec 2020.   # History of adenomatous colon polyps. Five year colonoscopy recommended Dec 2025 ( may  need to be done sooner given IDA)  # GERD, untreated. Gets frequent pyrosis at home.   # History of DVT. On Xarelto at home  # See PMH for additional medical  problems  -------------------------------------------------------------------------  GI Attending;  I have seen and evaluated the patient also. She has iron-def anemia  and some c/o pill > food dysphagia in setting of anorexia, ? Melena, weight loss and Ct abd/pelvis w/o major abnormalities. Hiatal hernia could be playing a role.  Plan for EGD and colonoscopy tomorrow, possible esophageal dilation.  Trurn off heparin 0800 12/23/21  The risks and benefits as well as alternatives of endoscopic procedure(s) have been discussed and reviewed. All questions answered. The patient agrees to proceed.  Gatha Mayer, MD, [facility] See AMION on call - gastroenterology for best contact person 12/22/2021 2:26 PM   Plan   Recommend dose of IV iron Given the intermittent black stool we will likely proceed with an EGD, ? Tomorrow. Hopefully colonoscopy will need be necessary but it may if EGD is negative  Xarelto is still on hold Upon discharge she should be on a PPI for frequent GERD symptoms at home   HPI   Stacy [name] hasn't had an appetite over the last two years. She recall a similar problem 5 years ago and was on Remeron. She doesn't have any associated abdominal pain, nausea, vomiting. She sometimes has problems swallowing pills but swallows food fine. Over the last couple of year her BP and diabetes meds  have been changed and that could be causing decreased appetite.   She endorses anxiety and situational depression. She is a widow. Holidays are particularly hard.   Stacy [name] hasn't seen any blood in her stools. No vaginal bleeding. No epistaxis. Rarely takes ibuprofen. No other NSAIDS. She has donated blood about 3-4 times in the last five years.    Previous GI Evaluation    Dec 2020 EGD -Normal mucosa was found in the lower third of the esophagus. - 5 cm hiatal hernia. - Gastritis. Biopsied. - Normal examined duodenum.  Dec 2020 colonoscopy  - complete exam, good  prep -One 5 mm polyp at the hepatic flexure, removed with a cold snare. Resected and retrieved. - One 5 mm polyp in the descending colon, removed with a cold snare. Resected and retrieved. - One 3 mm polyp in the rectum, removed with a cold snare. Resected and retrieved. - Diverticulosis in the sigmoid colon, in the descending colon and in the ascending colon. - Small internal hemorrhoids.  Surgical [P], gastric antrum and gastric body - ANTRAL AND OXYNTIC MUCOSA WITH MILD CHRONIC INFLAMMATION. [place] NEGATIVE FOR HELICOBACTER PYLORI. - NO INTESTINAL METAPLASIA, DYSPLASIA OR MALIGNANCY. 2. Surgical [P], colon, descending and hepatic flexure, polyp (2) - TUBULAR ADENOMA(S). - NO HIGH GRADE DYSPLASIA OR MALIGNANCY. 3. Surgical [P], colon, rectum, polyp - TUBULAR ADENOMA. - NO HIGH GRADE DYSPLASIA OR MALIGNANCY    Recent Labs and Imaging CT ABDOMEN PELVIS W CONTRAST  Result Date: 12/21/2021 CLINICAL DATA:  Unintentional weight loss. Hiatal hernia. Clinical suspicion for malignancy. EXAM: CT ABDOMEN AND PELVIS WITH CONTRAST TECHNIQUE: Multidetector CT imaging of the abdomen and pelvis was performed using the standard protocol following bolus administration of intravenous contrast. RADIATION DOSE REDUCTION: This exam was performed according to the departmental dose-optimization program which includes automated exposure control, adjustment of the mA and/or kV according to patient size and/or use of iterative reconstruction technique. CONTRAST:  20m OMNIPAQUE IOHEXOL 300 MG/ML  SOLN COMPARISON:  02/16/2018 FINDINGS: Lower Chest: No acute findings. Hepatobiliary: No hepatic masses identified. Gallbladder is unremarkable. No evidence of biliary ductal dilatation. Pancreas:  No mass or inflammatory changes. Spleen: Within normal limits in size and appearance. Adrenals/Urinary Tract: 3.2 x 2.9 cm right adrenal mass remains stable, consistent with benign adenoma (no followup imaging is  recommended). No suspicious renal masses identified. No evidence of ureteral calculi or hydronephrosis. Stomach/Bowel: Moderate to large hiatal hernia is again seen. No evidence of obstruction, inflammatory process or abnormal fluid collections. Normal appendix visualized. Mild colonic diverticulosis is noted, however there is no evidence of diverticulitis. Vascular/Lymphatic: No pathologically enlarged lymph nodes. No acute vascular findings. Aortic atherosclerotic calcification incidentally noted. Reproductive: 1 cm calcified uterine fibroid remains stable. Adnexal regions are unremarkable. Other:  None. Musculoskeletal:  No suspicious bone lesions identified. IMPRESSION: No acute findings. No evidence of malignancy. Stable moderate to large hiatal hernia. Stable benign right adrenal adenoma (no followup imaging is recommended) . Stable small calcified uterine fibroid. Aortic Atherosclerosis (ICD10-I70.0). Electronically Signed   By: JMarlaine HindM.D.   On: 12/21/2021 16:49    Labs:  Recent Labs    12/20/21 0735 12/21/21 0548 12/22/21 0427  WBC 8.9 8.3 7.9  HGB 9.4* 8.6* 8.2*  HCT 30.5* 28.0* 26.9*  PLT 347 305 319   Recent Labs    12/20/21 0735 12/21/21 0548 12/22/21 0427  NA 134* 140 140  K 4.5 4.3 4.1  CL 101 109 112*  CO2 '26 25 23  '$ GLUCOSE 154* 113*  115*  BUN 57* 33* 18  CREATININE 1.42* 0.99 0.76  CALCIUM 9.3 8.5* 8.1*   Recent Labs    12/20/21 0735 12/21/21 0548 12/22/21 0427  PROT 7.2   < > 6.3*  ALBUMIN 3.3*   < > 2.9*  AST 14*   < > 20  ALT 12   < > 19  ALKPHOS 54   < > 43  BILITOT 0.2*   < > 0.3  BILIDIR 0.1  --   --   IBILI 0.1*  --   --    < > = values in this interval not displayed.   No results for input(s): "HEPBSAG", "HCVAB", "HEPAIGM", "HEPBIGM" in the last 72 hours. No results for input(s): "LABPROT", "INR" in the last 72 hours.  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 Posey Pronto 02/27/2014   Tobacco abuse    Tubular adenoma of colon     Past Surgical History:  Procedure Laterality Date   ANAL FISTULECTOMY  01/19/1985   BREAST EXCISIONAL BIOPSY Left    BREAST LUMPECTOMY Left 1988   PAROTIDECTOMY  03/30/2011   Procedure: PAROTIDECTOMY;  Surgeon: Izora Gala, MD;  Location: [facility];  Service: ENT;  Laterality: Right;   WRIST GANGLION EXCISION Left 1988    Family History  Problem Relation Age of Onset   Coronary artery disease Father 63       CAD,CABG   Hypertension Mother    Stroke Mother    Diabetes Sister        uncontrolled   Cancer Sister 48       uterine cancer   Aneurysm Sister        brain   Heart attack Maternal Grandmother    Blindness Paternal Grandmother    Breast cancer Neg Hx    Colon cancer Neg Hx    Esophageal cancer Neg Hx    Rectal cancer Neg Hx    Stomach cancer Neg Hx     Prior to Admission medications   Medication Sig Start Date End Date Taking? Authorizing Provider  albuterol (VENTOLIN HFA) 108 (90 Base) MCG/ACT inhaler Inhale 2 puffs into the lungs every 4 (four) hours as needed for wheezing or shortness of breath. 04/07/21  Yes [name], MD  allopurinol (ZYLOPRIM) 300 MG tablet Take 1 tablet (300 mg total) by mouth daily. 06/12/21  Yes [name], MD   amLODipine (NORVASC) 10 MG tablet Take 10 mg by mouth daily. 12/01/21  Yes [provider]  colchicine 0.6 MG tablet Take 1 tablet (0.6 mg total)  by mouth 2 (two) times daily as needed (gout flare). 10/28/21  Yes [name], MD  EPINEPHrine 0.3 mg/0.3 mL IJ SOAJ injection Insert into muscle once with any facial or neck swelling  Instruct in use 01/25/17  Yes Bessemer, Modena Nunnery, MD  escitalopram (LEXAPRO) 10 MG tablet Take 1 tablet (10 mg total) by mouth daily. 08/14/21  Yes [name], MD  folic acid (FOLVITE) 1 MG tablet Take 1 tablet (1 mg total) by mouth daily. 06/12/21  Yes [name], MD  glimepiride (AMARYL) 1 MG tablet TAKE 1 TABLET BY MOUTH DAILY BEFORE SUPPER. 08/14/21  Yes [name], MD  metFORMIN (GLUCOPHAGE-XR) 500 MG 24 hr tablet Take 2 tablets (1,000 mg total) by mouth daily with supper. 08/14/21  Yes [name], MD  pioglitazone (ACTOS) 15 MG tablet Take 1 tablet (15 mg total) by mouth daily. 04/07/21  Yes [name], MD  spironolactone (ALDACTONE) 25 MG tablet Take 1 tablet (25 mg total) by mouth daily. 06/12/21 12/19/21 Yes [name], MD  traZODone (DESYREL) 100 MG tablet TAKE 1 TO 2 TABLETS BY MOUTH AS NEEDED FOR SLEEP Patient taking differently: Take 200 mg by mouth at bedtime. 08/13/21  Yes [name], MD  valsartan-hydrochlorothiazide (DIOVAN-HCT) 320-12.5 MG tablet Take 1 tablet by mouth daily. 11/13/21  Yes Skeet Latch, MD  XARELTO 20 MG TABS tablet Take 20 mg by mouth every evening. 11/25/21  Yes [provider]  amLODipine (NORVASC) 5 MG tablet Take 1 tablet (5 mg total) by mouth daily. Patient not taking: Reported on 12/19/2021 08/22/21   [name], MD  glucose blood (FREESTYLE PRECISION NEO TEST) test strip Use as instructed 02/04/21   [name], MD  Lancets (FREESTYLE) lancets Use as instructed 02/04/21   [name], MD  nicotine (NICOTINE STEP 1) 21 mg/24hr patch Place 1 patch (21  mg total) onto the skin daily. Patient not taking: Reported on 12/19/2021 02/04/21   [name], MD  nicotine polacrilex (NICOTINE MINI) 2 MG lozenge Use three times a day as needed for smoking cessation Patient not taking: Reported on 12/19/2021 02/04/21   [name], MD  Lancets MISC Use as instructed to monitor FSBS 3x daily. Dx: E11.65 01/03/15   Alycia Rossetti, MD    Current Facility-Administered Medications  Medication Dose Route Frequency Provider Last Rate Last Admin   0.9 %  sodium chloride infusion  250 mL Intravenous PRN Sherwood Gambler, MD       0.9 %  sodium chloride infusion   Intravenous Continuous Raiford Noble [place], DO 75 mL/hr at 12/22/21 0041 Infusion Verify at 12/22/21 0041   allopurinol (ZYLOPRIM) tablet 300 mg  300 mg Oral Daily Raiford Noble [place], DO   300 mg at 12/22/21 1014   alum & mag hydroxide-simeth (MAALOX/MYLANTA) 200-200-20 MG/5ML suspension 30 mL  30 mL Oral Q6H PRN Tu, Ching T, DO   30 mL at 12/21/21 2113   amLODipine (NORVASC) tablet 10 mg  10 mg Oral Daily Tu, Ching T, DO   10 mg at 12/22/21 1014   Chlorhexidine Gluconate Cloth 2 % PADS 6 each  6 each Topical Daily Reubin Milan, MD   6 each at 12/22/21 1014   colchicine tablet 0.3 mg  0.3 mg Oral BID Raiford Noble Latif, DO   0.3 mg at 12/22/21 1014   escitalopram (LEXAPRO) tablet 10 mg  10 mg Oral Daily Tu, Ching T, DO  10 mg at 12/22/21 1013   feeding supplement (GLUCERNA SHAKE) (GLUCERNA SHAKE) liquid 237 mL  237 mL Oral TID BM Sheikh, Omair Latif, DO       heparin ADULT infusion 100 units/mL (25000 units/277m)  550 Units/hr Intravenous Continuous WEmiliano Dyer RPH 5.5 mL/hr at 12/22/21 [id number] Units/hr at 12/22/21 0643   LORazepam (ATIVAN) tablet 1 mg  1 mg Oral Q6H PRN SRaiford NobleLatif, DO   1 mg at 12/21/21 2113   multivitamin with minerals tablet 1 tablet  1 tablet Oral Daily SRaiford NobleLSheldon DNevada      Oral care mouth rinse  15 mL Mouth Rinse PRN SAlfredia Ferguson OGeorgina QuintLatif,  DO       pantoprazole (PROTONIX) EC tablet 40 mg  40 mg Oral Daily SRaiford NobleLGuthrie DO   40 mg at 12/22/21 1013   phosphorus (K PHOS NEUTRAL) tablet 500 mg  500 mg Oral BID Sheikh, Omair Latif, DO       sodium chloride flush (NS) 0.9 % injection 3 mL  3 mL Intravenous Q12H GSherwood Gambler MD   3 mL at 12/21/21 2123   sodium chloride flush (NS) 0.9 % injection 3 mL  3 mL Intravenous PRN GSherwood Gambler MD       traZODone (DESYREL) tablet 200 mg  200 mg Oral QHS Tu, Ching T, DO   200 mg at 12/21/21 2113    Allergies as of 12/19/2021 - Review Complete 12/19/2021  Allergen Reaction Noted   Almond meal Anaphylaxis and Swelling 10/03/2013   Other Anaphylaxis 03/29/2019   Aspirin Nausea Only 03/24/2011    Social History   Socioeconomic History   Marital status: Widowed    Spouse name: Not on file   Number of children: 2   Years of education: Not on file   Highest education level: Not on file  Occupational History    Employer: [employer] Tobacco Use   Smoking status: Every Day    Packs/day: 1.00    Years: 28.00    Total pack years: 28.00    Types: Cigarettes   Smokeless tobacco: Never  Vaping Use   Vaping Use: Never used  Substance and Sexual Activity   Alcohol use: Yes    Alcohol/week: 14.0 standard drinks of alcohol    Types: 14 Standard drinks or equivalent per week   Drug use: No   Sexual activity: Not Currently    Birth control/protection: Post-menopausal  Other Topics Concern   Not on file  Social History Narrative   Married. 2 children.    Ages [age]y/o ( entered 09/2012)      Works with [employer]  Social Determinants of Health   Financial Resource Strain: Not on file  Food Insecurity: No Food Insecurity (12/20/2021)   Hunger Vital Sign    Worried About Running Out of Food in the Last Year: Never true    Ran Out of Food in the Last Year: Never true  Transportation Needs: No Transportation Needs (12/20/2021)    PRAPARE - THydrologist(Medical): No    Lack of Transportation (Non-Medical): No  Physical Activity: Not on file  Stress: Not on file  Social Connections: Not on file  Intimate Partner Violence: Not At Risk (12/20/2021)   Humiliation, Afraid, Rape, and Kick questionnaire    Fear of Current or Ex-Partner: No    Emotionally Abused: No    Physically Abused: No  Sexually Abused: No    Review of Systems: All systems reviewed and negative except where noted in HPI.  Physical Exam: Vital signs in last 24 hours: Temp:  [98.1 F (36.7 C)-98.8 F (37.1 C)] 98.7 F (37.1 C) (12/04 0521) Pulse Rate:  [84-91] 91 (12/04 0521) Resp:  [17] 17 (12/04 0521) BP: (100-129)/(66-79) 129/79 (12/04 0521) SpO2:  [96 %-98 %] 96 % (12/04 0521) Last BM Date : 12/20/21  General:  Alert female in NAD Psych:  Pleasant, cooperative. Normal mood and affect Eyes: Pupils equal Ears:  Normal auditory acuity Nose: No deformity, discharge or lesions Neck:  Supple, no masses felt Lungs:  Clear to auscultation.  Heart:  Regular rate, regular rhythm. No lower extremity edema Abdomen:  Soft, nondistended, nontender, active bowel sounds, no masses felt Rectal :  Deferred Msk: Symmetrical without gross deformities.  Neurologic:  Alert, oriented, grossly normal neurologically Skin:  Intact without significant lesions.    Intake/Output from previous day: 12/03 0701 - 12/04 0700 In: 1998.6 [P.O.:480; I.V.:1468.6; IV Piggyback:50] Out: -  Intake/Output this shift:  No intake/output data recorded.    Principal Problem:   Hypoglycemia Active Problems:   Hypertension   GERD (gastroesophageal reflux disease)   Type 2 diabetes mellitus with other specified complication (HCC)   Acute renal failure ([place])   History of DVT (deep vein thrombosis)    Tye Savoy, NP-C @  12/22/2021, 12:32 PM

## 2021-12-23 ENCOUNTER — Inpatient Hospital Stay (HOSPITAL_COMMUNITY): Payer: Commercial Managed Care - HMO | Admitting: Certified Registered"

## 2021-12-23 ENCOUNTER — Inpatient Hospital Stay (HOSPITAL_COMMUNITY): Payer: Commercial Managed Care - HMO

## 2021-12-23 ENCOUNTER — Encounter (HOSPITAL_COMMUNITY)
Admission: EM | Disposition: A | Discharge status: Home / Self Care | Payer: Self-pay | Source: Home / Self Care | Attending: Internal Medicine

## 2021-12-23 DIAGNOSIS — K5730 Diverticulosis of large intestine without perforation or abscess without bleeding: Secondary | ICD-10-CM | POA: Diagnosis not present

## 2021-12-23 DIAGNOSIS — K222 Esophageal obstruction: Secondary | ICD-10-CM

## 2021-12-23 DIAGNOSIS — K3189 Other diseases of stomach and duodenum: Secondary | ICD-10-CM

## 2021-12-23 DIAGNOSIS — Z86718 Personal history of other venous thrombosis and embolism: Secondary | ICD-10-CM | POA: Diagnosis not present

## 2021-12-23 DIAGNOSIS — K449 Diaphragmatic hernia without obstruction or gangrene: Secondary | ICD-10-CM | POA: Diagnosis not present

## 2021-12-23 DIAGNOSIS — N179 Acute kidney failure, unspecified: Secondary | ICD-10-CM | POA: Diagnosis not present

## 2021-12-23 DIAGNOSIS — I10 Essential (primary) hypertension: Secondary | ICD-10-CM

## 2021-12-23 DIAGNOSIS — D509 Iron deficiency anemia, unspecified: Secondary | ICD-10-CM

## 2021-12-23 DIAGNOSIS — K219 Gastro-esophageal reflux disease without esophagitis: Secondary | ICD-10-CM | POA: Diagnosis not present

## 2021-12-23 DIAGNOSIS — F17210 Nicotine dependence, cigarettes, uncomplicated: Secondary | ICD-10-CM

## 2021-12-23 DIAGNOSIS — E162 Hypoglycemia, unspecified: Secondary | ICD-10-CM | POA: Diagnosis not present

## 2021-12-23 DIAGNOSIS — R1319 Other dysphagia: Secondary | ICD-10-CM

## 2021-12-23 HISTORY — PX: BALLOON DILATION: SHX5330

## 2021-12-23 HISTORY — PX: COLONOSCOPY WITH PROPOFOL: SHX5780

## 2021-12-23 HISTORY — PX: BIOPSY: SHX5522

## 2021-12-23 HISTORY — PX: ESOPHAGOGASTRODUODENOSCOPY (EGD) WITH PROPOFOL: SHX5813

## 2021-12-23 LAB — COMPREHENSIVE METABOLIC PANEL
ALT: 22 U/L (ref 0–44)
AST: 25 U/L (ref 15–41)
Albumin: 2.9 g/dL — ABNORMAL LOW (ref 3.5–5.0)
Alkaline Phosphatase: 45 U/L (ref 38–126)
Anion gap: 6 (ref 5–15)
BUN: 10 mg/dL (ref 6–20)
CO2: 20 mmol/L — ABNORMAL LOW (ref 22–32)
Calcium: 8.7 mg/dL — ABNORMAL LOW (ref 8.9–10.3)
Chloride: 114 mmol/L — ABNORMAL HIGH (ref 98–111)
Creatinine, Ser: 0.78 mg/dL (ref 0.44–1.00)
GFR, Estimated: >60 mL/min (ref >60)
Glucose, Bld: 97 mg/dL (ref 70–99)
Potassium: 3.9 mmol/L (ref 3.5–5.1)
Sodium: 140 mmol/L (ref 135–145)
Total Bilirubin: 0.4 mg/dL (ref 0.3–1.2)
Total Protein: 6.7 g/dL (ref 6.5–8.1)

## 2021-12-23 LAB — VITAMIN D 25 HYDROXY (VIT D DEFICIENCY, FRACTURES): Vit D, 25-Hydroxy: 25.24 ng/mL — ABNORMAL LOW (ref 30–100)

## 2021-12-23 LAB — CBC WITH DIFFERENTIAL/PLATELET
Abs Immature Granulocytes: 0.01 10*3/uL (ref 0.00–0.07)
Basophils Absolute: 0 10*3/uL (ref 0.0–0.1)
Basophils Relative: 1 %
Eosinophils Absolute: 0.4 10*3/uL (ref 0.0–0.5)
Eosinophils Relative: 5 %
HCT: 28.4 % — ABNORMAL LOW (ref 36.0–46.0)
Hemoglobin: 8.7 g/dL — ABNORMAL LOW (ref 12.0–15.0)
Immature Granulocytes: 0 %
Lymphocytes Relative: 35 %
Lymphs Abs: 2.6 10*3/uL (ref 0.7–4.0)
MCH: 23.2 pg — ABNORMAL LOW (ref 26.0–34.0)
MCHC: 30.6 g/dL (ref 30.0–36.0)
MCV: 75.7 fL — ABNORMAL LOW (ref 80.0–100.0)
Monocytes Absolute: 0.5 10*3/uL (ref 0.1–1.0)
Monocytes Relative: 7 %
Neutro Abs: 3.9 10*3/uL (ref 1.7–7.7)
Neutrophils Relative %: 52 %
Platelets: 337 10*3/uL (ref 150–400)
RBC: 3.75 MIL/uL — ABNORMAL LOW (ref 3.87–5.11)
RDW: 22.7 % — ABNORMAL HIGH (ref 11.5–15.5)
WBC: 7.4 10*3/uL (ref 4.0–10.5)
nRBC: 0 % (ref 0.0–0.2)

## 2021-12-23 LAB — HEPARIN LEVEL (UNFRACTIONATED): Heparin Unfractionated: 0.32 IU/mL (ref 0.30–0.70)

## 2021-12-23 LAB — PHOSPHORUS: Phosphorus: 3.2 mg/dL (ref 2.5–4.6)

## 2021-12-23 LAB — GLUCOSE, CAPILLARY
Glucose-Capillary: 82 mg/dL (ref 70–99)
Glucose-Capillary: 116 mg/dL — ABNORMAL HIGH (ref 70–99)
Glucose-Capillary: 155 mg/dL — ABNORMAL HIGH (ref 70–99)
Glucose-Capillary: 131 mg/dL — ABNORMAL HIGH (ref 70–99)

## 2021-12-23 LAB — MAGNESIUM: Magnesium: 1.7 mg/dL (ref 1.7–2.4)

## 2021-12-23 SURGERY — ESOPHAGOGASTRODUODENOSCOPY (EGD) WITH PROPOFOL
Anesthesia: Monitor Anesthesia Care

## 2021-12-23 MED ORDER — SODIUM CHLORIDE 0.9 % IV SOLN: INTRAVENOUS | Status: DC

## 2021-12-23 MED ORDER — PROPOFOL 500 MG/50ML IV EMUL
INTRAVENOUS | Status: DC | PRN
  Administered 2021-12-23: 150 ug/kg/min via INTRAVENOUS

## 2021-12-23 MED ORDER — LACTATED RINGERS IV SOLN
INTRAVENOUS | Status: DC
  Administered 2021-12-23: 1000 mL via INTRAVENOUS

## 2021-12-23 MED ORDER — HEPARIN (PORCINE) 25000 UT/250ML-% IV SOLN: 600.0000 [IU]/h | INTRAVENOUS | Status: DC

## 2021-12-23 MED ORDER — LIDOCAINE 2% (20 MG/ML) 5 ML SYRINGE
INTRAMUSCULAR | Status: DC | PRN
  Administered 2021-12-23: 80 mg via INTRAVENOUS

## 2021-12-23 MED ORDER — PROPOFOL 10 MG/ML IV BOLUS
INTRAVENOUS | Status: DC | PRN
  Administered 2021-12-23: 40 mg via INTRAVENOUS
  Administered 2021-12-23: 20 mg via INTRAVENOUS
  Administered 2021-12-23: 10 mg via INTRAVENOUS

## 2021-12-23 MED ORDER — MAGNESIUM SULFATE 2 GM/50ML IV SOLN
2.0000 g | Freq: Once | INTRAVENOUS | Status: AC
  Administered 2021-12-23: 2 g via INTRAVENOUS

## 2021-12-23 MED ORDER — LACTATED RINGERS IV SOLN: INTRAVENOUS | Status: DC

## 2021-12-23 SURGICAL SUPPLY — 25 items

## 2021-12-23 NOTE — Transfer of Care (Signed)
Immediate Anesthesia Transfer of Care Note  Patient: Quintina R Kerner  Procedure(s) Performed: ESOPHAGOGASTRODUODENOSCOPY (EGD) WITH PROPOFOL COLONOSCOPY WITH PROPOFOL  Patient Location: PACU and Endoscopy Unit  Anesthesia Type:MAC  Level of Consciousness: awake, alert , and patient cooperative  Airway & Oxygen Therapy: Patient Spontanous Breathing and Patient connected to face mask oxygen  Post-op Assessment: Report given to RN and Post -op Vital signs reviewed and stable  Post vital signs: Reviewed and stable  Last Vitals:  Vitals Value Taken Time  BP 123/85   Temp    Pulse 84 12/23/21 1216  Resp 19 12/23/21 1217  SpO2 100 % 12/23/21 1216  Vitals shown include unvalidated device data.  Last Pain:  Vitals:   12/23/21 1101  TempSrc: Temporal  PainSc: 0-No pain         Complications: No notable events documented.

## 2021-12-23 NOTE — Interval H&P Note (Signed)
History and Physical Interval Note:  12/23/2021 11:08 AM  Stacy [name]  has presented today for surgery, with the diagnosis of iron deficency anemia.  The various methods of treatment have been discussed with the patient and family. After consideration of risks, benefits and other options for treatment, the patient has consented to  Procedure(s): ESOPHAGOGASTRODUODENOSCOPY (EGD) WITH PROPOFOL (N/A) COLONOSCOPY WITH PROPOFOL (N/A) as a surgical intervention.  The patient's history has been reviewed, patient examined, no change in status, stable for surgery.  I have reviewed the patient's chart and labs.  Questions were answered to the patient's satisfaction.     Silvano Rusk

## 2021-12-23 NOTE — Op Note (Signed)
[facility] Patient Name: Stacy [name] Procedure Date: 12/23/2021 MRN: [id number] Attending MD: Gatha Mayer , MD, [id number] Date of Birth: 06-Mar-1965 CSN: [id number] Age: 56 Admit Type: Inpatient Procedure:                Upper GI endoscopy Indications:              Dysphagia, Anorexia, Weight loss Providers:                Gatha Mayer, MD, Cathlean Marseilles, [facility]                            Technician, Technician Referring MD:              Medicines:                Monitored Anesthesia Care Complications:            No immediate complications. Estimated Blood Loss:     Estimated blood loss was minimal. Procedure:                Pre-Anesthesia Assessment:                           - Prior to the procedure, a History and Physical                            was performed, and patient medications and                            allergies were reviewed. The patient's tolerance of                            previous anesthesia was also reviewed. The risks                            and benefits of the procedure and the sedation                            options and risks were discussed with the patient.                            All questions were answered, and informed consent                            was obtained. Prior Anticoagulants: The patient                            last took Xarelto (rivaroxaban) 4 days prior to the                            procedure and last took heparin on the day of the                            procedure. ASA Grade Assessment: III - A patient  with severe systemic disease. After reviewing the                            risks and benefits, the patient was deemed in                            satisfactory condition to undergo the procedure.                           After obtaining informed consent, the endoscope was                            passed under direct vision. Throughout the                             procedure, the patient's blood pressure, pulse, and                            oxygen saturations were monitored continuously. The                            [id number] ([id number] Olympus therapeutic endoscope                            was introduced through the mouth, and advanced to                            the second part of duodenum. The upper GI endoscopy                            was accomplished without difficulty. The patient                            tolerated the procedure well. Scope In: Scope Out: Findings:      The gastroesophageal flap valve was visualized endoscopically and       classified as Hill Grade IV (no fold, wide open lumen, hiatal hernia       present).      One benign-appearing, intrinsic moderate stenosis was found. The       stenosis was traversed. A TTS dilator was passed through the scope.       Dilation with an 18-19-20 mm balloon dilator was performed to 20 mm. The       dilation site was examined and showed mild mucosal disruption. Estimated       blood loss was minimal.      Patchy mildly erythematous mucosa without bleeding was found in the       prepyloric region of the stomach. Biopsies were taken with a cold       forceps for histology. Verification of patient identification for the       specimen was done. Estimated blood loss was minimal.      The exam was otherwise without abnormality.      The cardia and gastric fundus were normal on retroflexion.      A 5 cm hiatal hernia was present. Impression:               +  other antrum ? H pylori                           - Gastroesophageal flap valve classified as Hill                            Grade IV (no fold, wide open lumen, hiatal hernia                            present).                           - Benign-appearing esophageal stenosis. Dilated.                           - Erythematous mucosa in the prepyloric region of                            the stomach. Biopsied.                            - The examination was otherwise normal.                           - 5 cm hiatal hernia. There may have been a very                            small/slight [name] erosion which could explain                            iron-deficiency Moderate Sedation:      Not Applicable - Patient had care per Anesthesia. Recommendation:           - See the other procedure note for documentation of                            additional recommendations. colonoscopy next                           - Stay on PPI                           I will f/u pathology and contact from office Procedure Code(s):        --- Professional ---                           [phone], Esophagogastroduodenoscopy, flexible,                            transoral; with transendoscopic balloon dilation of                            esophagus (less than 30 mm diameter)                           [id number], 59, Esophagogastroduodenoscopy, flexible,  transoral; with biopsy, single or multiple Diagnosis Code(s):        --- Professional ---                           K44.9, Diaphragmatic hernia without obstruction or                            gangrene                           K22.2, Esophageal obstruction                           K31.89, Other diseases of stomach and duodenum                           R13.10, Dysphagia, unspecified                           R63.0, Anorexia                           R63.4, Abnormal weight loss CPT copyright 2022 American [employer]. All rights reserved. The codes documented in this report are preliminary and upon coder review may  be revised to meet current compliance requirements. Gatha Mayer, MD 12/23/2021 12:19:26 PM This report has been signed electronically. Number of Addenda: 0

## 2021-12-23 NOTE — Progress Notes (Addendum)
ANTICOAGULATION CONSULT NOTE   Pharmacy Consult for heparin Indication: VTE treatment  Allergies  Allergen Reactions   Almond Meal Anaphylaxis and Swelling   Other Anaphylaxis    Almond Butter   Aspirin Nausea Only    Other Reaction(s): Not available, Unknown    Patient Measurements: Height: '5\' 3"'$  (160 cm) Weight: 59.1 kg (130 lb 4.7 oz) IBW/kg (Calculated) : 52.4 Heparin Dosing Weight: 59.1 kg  Vital Signs: Temp: 98.7 F (37.1 C) (12/05 0545) Temp Source: Oral (12/05 0545) BP: 109/72 (12/05 0545) Pulse Rate: 87 (12/05 0545)  Labs: Recent Labs    12/21/21 0548 12/21/21 0857 12/21/21 1857 12/22/21 0427 12/22/21 1050 12/23/21 0549  HGB 8.6*  --   --  8.2*  --  8.7*  HCT 28.0*  --   --  26.9*  --  28.4*  PLT 305  --   --  319  --  337  APTT 186* 199* 120* 58*  --   --   HEPARINUNFRC >1.10* >1.10*  --  0.43 0.47 0.32  CREATININE 0.99  --   --  0.76  --  0.78     Estimated Creatinine Clearance: 64.2 mL/min (by C-G formula based on SCr of 0.78 mg/dL).  Assessment: 56 yo female with abnormal labs, hx of mild CAD, HTN, HLD, fatty liver disease, elevated LFTs, tobacco use, on xarelto for hx of DVT.  Due to AKI pharmacy consulted to dose heparin drip. LD xarelto 11/30 @ 2100  Today, 12/23/21 Daily heparin level remains therapeutic although lower on 550 units/hr Hgb down 1 g since admission (likely dilutional) but stable; PLT stable WNL AKI on admission resolved; SCr back to baseline No bleeding or infusion issues per RN, but based on reports of intermittent dark stool PTA, scheduled for EGD on 12/5  Goal of Therapy:  Heparin level 0.3-0.7 units/ml Monitor platelets by anticoagulation protocol: Yes   Plan:  Heparin infusion stopping at 0800 this AM for EGD later today When cleared by GI, would resume heparin at 600 units/hr Daily heparin level and CBC Monitor for signs/symptoms of bleeding F/u GI plans for transition back to [place], PharmD,  BCPS [phone] 12/23/2021, 7:43 AM   ADDENDUM Cleared by GI to resume heparin (no time specified)  Will resume heparin at 600 units/hr, starting at 1500 today  Reuel Boom, PharmD, BCPS [phone] 12/23/2021, 1:31 PM

## 2021-12-23 NOTE — Op Note (Signed)
[facility] Patient Name: Stacy [name] Procedure Date: 12/23/2021 MRN: [id number] Attending MD: Gatha Mayer , MD, [id number] Date of Birth: 04/12/1965 CSN: [id number] Age: 56 Admit Type: Inpatient Procedure:                Colonoscopy Indications:              Iron deficiency anemia Providers:                Gatha Mayer, MD, Cathlean Marseilles, [facility]                            Technician, Technician Referring MD:              Medicines:                Monitored Anesthesia Care Complications:            No immediate complications. Estimated Blood Loss:     Estimated blood loss: none. Procedure:                Pre-Anesthesia Assessment:                           - Prior to the procedure, a History and Physical                            was performed, and patient medications and                            allergies were reviewed. The patient's tolerance of                            previous anesthesia was also reviewed. The risks                            and benefits of the procedure and the sedation                            options and risks were discussed with the patient.                            All questions were answered, and informed consent                            was obtained. Prior Anticoagulants: The patient                            last took Xarelto (rivaroxaban) 4 days prior to the                            procedure and last took heparin on the day of the                            procedure. ASA Grade Assessment: III - A patient  with severe systemic disease. After reviewing the                            risks and benefits, the patient was deemed in                            satisfactory condition to undergo the procedure.                           - Prior to the procedure, a History and Physical                            was performed, and patient medications and                            allergies  were reviewed. The patient's tolerance of                            previous anesthesia was also reviewed. The risks                            and benefits of the procedure and the sedation                            options and risks were discussed with the patient.                            All questions were answered, and informed consent                            was obtained. Prior Anticoagulants: The patient                            last took Xarelto (rivaroxaban) 4 days prior to the                            procedure and last took heparin on the day of the                            procedure. ASA Grade Assessment: III - A patient                            with severe systemic disease. After reviewing the                            risks and benefits, the patient was deemed in                            satisfactory condition to undergo the procedure.                           After obtaining informed consent, the colonoscope  was passed under direct vision. Throughout the                            procedure, the patient's blood pressure, pulse, and                            oxygen saturations were monitored continuously. The                            [id number]-[id number] ([id number] Olympus colonoscope was                            introduced through the anus and advanced to the the                            cecum, identified by appendiceal orifice and                            ileocecal valve. The colonoscopy was performed                            without difficulty. The patient tolerated the                            procedure well. The quality of the bowel                            preparation was good. The ileocecal valve,                            appendiceal orifice, and rectum were photographed. Scope In: 11:50:34 AM Scope Out: 45:40:98 PM Scope Withdrawal Time: 0 hours 10 minutes 34 seconds  Total Procedure Duration: 0 hours 18 minutes 22  seconds  Findings:      The perianal and digital rectal examinations were normal.      Multiple diverticula were found in the sigmoid colon, descending colon       and ascending colon.      The exam was otherwise without abnormality on direct and retroflexion       views. Impression:               - Diverticulosis in the sigmoid colon, in the                            descending colon and in the ascending colon.                           - The examination was otherwise normal on direct                            and retroflexion views.                           - No specimens collected. Moderate Sedation:      Not Applicable - Patient had care per Anesthesia. Recommendation:           -  Return patient to hospital ward for ongoing care.                           - resume diet                           resume heparin today                           xarelto tomorrow                           hx 3 adenomas (diminutive) 2020 - recall                            colonoscopy Dr. Hilarie Fredrickson 5 yrs from now                           iron Tx Procedure Code(s):        --- Professional ---                           [phone], Colonoscopy, flexible; diagnostic, including                            collection of specimen(s) by brushing or washing,                            when performed (separate procedure) Diagnosis Code(s):        --- Professional ---                           D50.9, Iron deficiency anemia, unspecified                           K57.30, Diverticulosis of large intestine without                            perforation or abscess without bleeding CPT copyright 2022 American Medical Association. All rights reserved. The codes documented in this report are preliminary and upon coder review may  be revised to meet current compliance requirements. Gatha Mayer, MD 12/23/2021 12:23:52 PM This report has been signed electronically. Number of Addenda: 0

## 2021-12-23 NOTE — Anesthesia Postprocedure Evaluation (Signed)
Anesthesia Post Note  Patient: Stacy [name]  Procedure(s) Performed: ESOPHAGOGASTRODUODENOSCOPY (EGD) WITH PROPOFOL COLONOSCOPY WITH PROPOFOL     Patient location during evaluation: PACU Anesthesia Type: MAC Level of consciousness: awake and alert Pain management: pain level controlled Vital Signs Assessment: post-procedure vital signs reviewed and stable Respiratory status: spontaneous breathing, nonlabored ventilation, respiratory function stable and patient connected to nasal cannula oxygen Cardiovascular status: stable and blood pressure returned to baseline Postop Assessment: no apparent nausea or vomiting Anesthetic complications: no  No notable events documented.  Last Vitals:  Vitals:   12/23/21 1220 12/23/21 1230  BP: 123/85 126/85  Pulse: 82 80  Resp: (!) 21 20  Temp:    SpO2: 100% 99%    Last Pain:  Vitals:   12/23/21 1230  TempSrc:   PainSc: 0-No pain                 Garvey Westcott S

## 2021-12-23 NOTE — Progress Notes (Signed)
PROGRESS NOTE    Stacy [name]  XQJ:[id number] DOB: 01/25/1965 DOA: 12/19/2021 PCP: [name], MD   Brief Narrative:  The patient is a 56 year old African-American female with a past medical history significant for but not limited to CAD, hypertension, hyperlipidemia, renal artery stenosis, ongoing tobacco use, DVT as well as other comorbidities who presented with abnormal labs. She has been having poor appetite for the last year or so and has worsened last several months and has lost about 25 pounds. For the past month significantly dizzy and unsteady intermittently when she first gets out of bed. She went to get her labs done outpatient was found to have an acute renal failure and was advised to go to the ED. In the ED her glucose was noted to be down to 18. Of note she has been recently started on new ACE hydrochlorothiazide combination for the past week and a half and has occasionally taken Advil for headache as well as taking metformin, Actos and glimepiride for diabetes but is not on any insulin. In the ED she was having a temperature of 97.8 was normotensive on room air but she was found to be hypoglycemic in the 30s with acute renal failure and creatinine was noted to be 3.40 up from her prior creatinine of normal of 0.63. She was evaluated for her fatigue and admitted for hypoglycemia as well as acute kidney injury. She is getting IV fluid hydration and was placed on D10 drip overnight and this is now stopped and she is now changed to a normal saline at 75 MLS per hour.     GI was consulted given her iron deficiency anemia and weight loss and took her for an EGD and colonoscopy.  She has a right thyroid mass stool workup and obtain a thyroid ultrasound.  TSH was normal.  Assessment and Plan: * Hypoglycemia, improved -Suspect due to poor renal clearance of her antidiabetic medication of glimepiride in the setting of acute renal failure -Continue to hold all hold Diabetes Medication   -IV fluid hydration has now stopped -CBGs changed from every 4 to before meals and at bedtime now -Monitor CBGs per protocol and will check hemoglobin A1c in a.m; Last HbA1c was 6.6 but given her weight loss she may not need as strong of a regimen Repeat hemoglobin A1c is still pending -Blood sugars now ranging from 82-137   History of DVT (deep vein thrombosis) -Venous doppler on 07/2021 showed DVT of right popliteal vein. -Will need to hold Xarelto due to acute renal failure and place on IV heparin infusion continue for now and once renal function improves changed back to Xarelto once her procedures are over; now that colonoscopy and EGD have been done GI recommends continuing heparin drip today and then transition to her oral anticoagulation with Xarelto tomorrow on 12/24/2021   Acute renal failure ([place]), improving  Metabolic acidosis -Creatinine up to 3.40 from 0.63 baseline; patient's BUNs/creatinine is now slowly improving with IV fluid hydration and now gone from 59/3.15 -> 84/3.40 -> 57/1.42 -> 33/0.99 -> 18/0.76 and is now 10/0.78 -Patient has a slight metabolic acidosis with a CO2 of 20, anion gap of 6, chloride level 114 -Suspect a combination of dehydration, being on ACE-diuretic antihypertensive and occasional NSAIDS use. No symptoms concerning for post-obstructive process. Could consider renal ultrasound if renal function not improved with fluids overnight -Will discontinue IV fluid hydration now -Will need to hold Nephrotoxic medications, contrast dyes if possible, hypotension and dehydration to ensure adequate  renal perfusion -Repeat CMP in the AM    GERD (gastroesophageal reflux disease)/GI Prophylaxis  -C/w PPI and tums x 1: GI recommending upon discharge that she should be on a PPI for frequent GERD symptoms at home and likely will continue   Moderate to large hiatal hernia -GI involved and planning on EGD and colonoscopy in house 5 cm on EGD in December 2020 -Case was  discussed with surgery team who feels that they do not need to do any intervention at this point and referring to GI if necessary they are recommending having the GI physician and team referring her for surgical opinion and evaluation in the outpatient setting -GI evaluated as still 5 cm.   Hypertension -Continue Amlodipine 10 mg p.o. daily -Continue to hold valsartan-HCTZ due to ARF and likely can be resumed in the a.m. -Continue to monitor blood pressures per protocol  -Last blood pressure reading was a little elevated at 155/89   Hyponatremia -Mild. -Patient's Sodium went from 136 -> 133 -> 134 -> 140 x3 -Will stop NS at 75 mL/hr and Repeat CMP in the AM    Iron deficiency and microcytic Anemia -Patient's hemoglobin/hematocrit went from 9.7/31.1 -> 9.4/30.5 -> 8.6/28.0 -> 8.2/26.9 -> 8.7/28.4 with an MCV of 75.7 -Checked Anemia panel showed an iron level of 22, UIBC 508, TIBC of 530, saturation ratios of 4%, ferritin level 17, vitamin B12 42  -GI recommending transfusing IV iron Continue to monitor for signs and symptoms and; no overt bleeding noted but she has reported that she has had intermittent black stools and GI feels that she may have developed [name] lesions associated with her moderate to large hiatal hernia seen on CT versus esophagitis or peptic ulcer disease or AVMs.  They feel that intestinal malignancy seems less likely; see below -GI consulted and planning for an EGD and colonoscopy and see below -After her procedures GI recommends continuing heparin drip today and then transitioning back to her Xarelto tomorrow -Repeat CBC in a.m.   Unintentional Weight Loss -Patient states that she has lost about 25 pounds in the last few months and she states that she does not have a very good appetite -Check TSH and first time was 1.247 and repeat was 0.808 -Chest x-ray done and showed "No acute cardiopulmonary disease. Moderate to large hiatal hernia potentially larger than on  previous imaging." -Will obtain CT scan of the abdomen pelvis with contrast for further evaluation now that she has further improvement in Renal Fxn  -CT Scan Abd/Pelvis done and showed "No acute findings. No evidence of malignancy. Stable moderate to large hiatal hernia. Stable benign right adrenal adenoma (no followup imaging is recommended).Stable small calcified uterine fibroid. Aortic Atherosclerosis." -Since she is having trouble swallowing so we will obtain a SLP evaluation -Will consult nutrition for further evaluation recommendations and they are adding Glucerna shakes p.o. 3 times daily, multivitamin with minerals daily as well as checking vitamin D labs given her poor p.o. -Given concerns GI has been consulted and planning on doing an EGD and colonoscopy in the morning and they feel that metformin can cause also weight loss some improvement she denies any nausea or abdominal pain associated with the weight loss -EGD done and showed that she had a gastroesophageal flap valve as classified as a Hill grade 4 about 9 appearing esophageal stenosis that was dilated as well as erythematous mucosa in the prepyloric region of the stomach that was biopsied and the antrum with questionable high H. pylori.  She  also noted to have a 5 cm hiatal hernia as well as very small slight [name] erosion that could have explain her iron deficiency -COLONOSCOPY done and showed diverticulosis in the sigmoid colon and the descending colon and ascending colon the examination was otherwise normal on direct retroflexion views with no specimens collected   Dysphagia -SLP to evaluate and will follow up after EGD -GI did an EGD and Colonoscopy as below -Appreciate GI Assistance  Thyroid Mass on Right  -Possibly contributing to her trouble swallowing pills -SLP evaluating -TSH normal and went from 1.274 and is now 0.808 -Check Thyroid U/S    Depression and Anxiety -Continue with Escitalopram 10 mg p.o. daily as well  as Trazodone 200 mg p.o. nightly   Hypoalbuminemia -Patient's Albumin level is now 3.3 -> 3.0 -> 2.9 -> 2.9 -Continue to monitor and trend and repeat CMP in a.m.   Hypophosphatemia -Patient's Phos Level is now 2.0 -> 3.2 -Continue to Monitor and Replete as Necessary -Repeat Phos Level in the AM   Gout, improving  -Has Gout of the [place] and thinks she is having a flare  -Start Prednisone 40 mg po Daily and Resume Allopurinol 300 mg   DVT prophylaxis: Anticoag with heparin drip    Code Status: Full Code Family Communication: No family currently at bedside  Disposition Plan:  Level of care: Telemetry Status is: Inpatient Remains inpatient appropriate because: Clearance and evaluation of her thyroid.  Anticipating discharge in the next 24 to 48 hours.   Consultants:  Gastroenterology  Procedures:  EGD Findings:      The gastroesophageal flap valve was visualized endoscopically and       classified as Hill Grade IV (no fold, wide open lumen, hiatal hernia       present).      One benign-appearing, intrinsic moderate stenosis was found. The       stenosis was traversed. A TTS dilator was passed through the scope.       Dilation with an 18-19-20 mm balloon dilator was performed to 20 mm. The       dilation site was examined and showed mild mucosal disruption. Estimated       blood loss was minimal.      Patchy mildly erythematous mucosa without bleeding was found in the       prepyloric region of the stomach. Biopsies were taken with a cold       forceps for histology. Verification of patient identification for the       specimen was done. Estimated blood loss was minimal.      The exam was otherwise without abnormality.      The cardia and gastric fundus were normal on retroflexion.      A 5 cm hiatal hernia was present. Impression:               + other antrum ? H pylori                           - Gastroesophageal flap valve classified as Hill                             Grade IV (no fold, wide open lumen, hiatal hernia                            present).                           -  Benign-appearing esophageal stenosis. Dilated.                           - Erythematous mucosa in the prepyloric region of                            the stomach. Biopsied.                           - The examination was otherwise normal.                           - 5 cm hiatal hernia. There may have been a very                            small/slight [name] erosion which could explain                            iron-deficiency  COLONOSCOPY Findings:      The perianal and digital rectal examinations were normal.      Multiple diverticula were found in the sigmoid colon, descending colon       and ascending colon.      The exam was otherwise without abnormality on direct and retroflexion       views. Impression:               - Diverticulosis in the sigmoid colon, in the                            descending colon and in the ascending colon.                           - The examination was otherwise normal on direct                            and retroflexion views.                           - No specimens collected.  Antimicrobials:  Anti-infectives (From admission, onward)    None       Subjective: Seen and examined at bedside after her EGD and colonoscopy and was doing fairly well.  Continue to complain about the swelling on the right side of her neck.  Awaiting SLP evaluation.  No other concerns or complaints at this time.  Objective: Vitals:   12/23/21 1220 12/23/21 1230 12/23/21 1234 12/23/21 1332  BP: 123/85 126/85 133/79 (!) 155/89  Pulse: 82 80 84 72  Resp: (!) 21 20 (!) 22 19  Temp:      TempSrc:      SpO2: 100% 99% 99% 100%  Weight:      Height:        Intake/Output Summary (Last 24 hours) at 12/23/2021 1620 Last data filed at 12/23/2021 1600 Gross per 24 hour  Intake 1349.17 ml  Output --  Net 1349.17 ml   Filed Weights   12/19/21 1146  12/19/21 1900 12/23/21 1101  Weight: 59.9 kg 59.1 kg 59.1 kg   Examination: Physical Exam:  Constitutional: WN/WD African-American female currently no  acute distress appears calm Neck: Appears normal, supple, no cervical masses, normal ROM, has some thyromegaly on the right side compared to the left Respiratory: Diminished to auscultation bilaterally, no wheezing, rales, rhonchi or crackles. Normal respiratory effort and patient is not tachypenic. No accessory muscle use.  Unlabored breathing Cardiovascular: RRR, no murmurs / rubs / gallops. S1 and S2 auscultated. Abdomen: Soft, non-tender, non-distended. Bowel sounds positive x4.  GU: Deferred. Musculoskeletal: No clubbing / cyanosis of digits/nails. No joint deformity upper and lower extremities.  Skin: No rashes, lesions, ulcers on limited skin evaluation. No induration; Warm and dry.  Neurologic: CN 2-12 grossly intact with no focal deficits. Romberg sign and cerebellar reflexes not assessed.  Psychiatric: Normal judgment and insight. Alert and oriented x 3. Normal mood and appropriate affect.   Data Reviewed: I have personally reviewed following labs and imaging studies  CBC: Recent Labs  Lab 12/19/21 1200 12/20/21 0735 12/21/21 0548 12/22/21 0427 12/23/21 0549  WBC 10.4 8.9 8.3 7.9 7.4  NEUTROABS  --   --  4.2 3.9 3.9  HGB 9.7* 9.4* 8.6* 8.2* 8.7*  HCT 31.1* 30.5* 28.0* 26.9* 28.4*  MCV 72.3* 73.3* 74.5* 75.1* 75.7*  PLT 365 347 305 319 720   Basic Metabolic Panel: Recent Labs  Lab 12/19/21 1200 12/20/21 0735 12/20/21 0825 12/21/21 0548 12/22/21 0427 12/23/21 0549  NA 133* 134*  --  140 140 140  K 4.8 4.5  --  4.3 4.1 3.9  CL 99 101  --  109 112* 114*  CO2 22 26  --  25 23 20*  GLUCOSE 30* 154*  --  113* 115* 97  BUN 84* 57*  --  33* 18 10  CREATININE 3.40* 1.42*  --  0.99 0.76 0.78  CALCIUM 10.4* 9.3  --  8.5* 8.1* 8.7*  MG  --   --  2.0 1.7 1.7 1.7  PHOS  --   --  2.6 2.1* 2.0* 3.2   GFR: Estimated  Creatinine Clearance: 64.2 mL/min (by C-G formula based on SCr of 0.78 mg/dL). Liver Function Tests: Recent Labs  Lab 12/20/21 0735 12/21/21 0548 12/22/21 0427 12/23/21 0549  AST 14* '20 20 25  '$ ALT '12 17 19 22  '$ ALKPHOS 54 48 43 45  BILITOT 0.2* 0.2* 0.3 0.4  PROT 7.2 6.5 6.3* 6.7  ALBUMIN 3.3* 3.0* 2.9* 2.9*   No results for input(s): "LIPASE", "AMYLASE" in the last 168 hours. No results for input(s): "AMMONIA" in the last 168 hours. Coagulation Profile: No results for input(s): "INR", "PROTIME" in the last 168 hours. Cardiac Enzymes: No results for input(s): "CKTOTAL", "CKMB", "CKMBINDEX", "TROPONINI" in the last 168 hours. BNP (last 3 results) No results for input(s): "PROBNP" in the last 8760 hours. HbA1C: No results for input(s): "HGBA1C" in the last 72 hours. CBG: Recent Labs  Lab 12/22/21 0819 12/22/21 1123 12/22/21 1607 12/23/21 0731 12/23/21 1106  GLUCAP 120* 137* 134* 82 116*   Lipid Profile: No results for input(s): "CHOL", "HDL", "LDLCALC", "TRIG", "CHOLHDL", "LDLDIRECT" in the last 72 hours. Thyroid Function Tests: Recent Labs    12/21/21 0548  TSH 0.808   Anemia Panel: Recent Labs    12/21/21 0548  VITAMINB12 423  FOLATE 18.5  FERRITIN 17  TIBC 530*  IRON 22*  RETICCTPCT 1.6   Sepsis Labs: No results for input(s): "PROCALCITON", "LATICACIDVEN" in the last 168 hours.  Recent Results (from the past 240 hour(s))  MRSA Next Gen by PCR, Nasal     Status: None  Collection Time: 12/19/21  7:03 PM   Specimen: Nasal Mucosa; Nasal Swab  Result Value Ref Range Status   MRSA by PCR Next Gen NOT DETECTED NOT DETECTED Final    Comment: (NOTE) The GeneXpert MRSA Assay (FDA approved for NASAL specimens only), is one component of a comprehensive MRSA colonization surveillance program. It is not intended to diagnose MRSA infection nor to guide or monitor treatment for MRSA infections. Test performance is not FDA approved in patients less than 73  years old. Performed at [facility], [place] [address] [place], [place] [address]     Radiology Studies: No results found.  Scheduled Meds:  allopurinol  300 mg Oral Daily   amLODipine  10 mg Oral Daily   Chlorhexidine Gluconate Cloth  6 each Topical Daily   colchicine  0.3 mg Oral BID   escitalopram  10 mg Oral Daily   feeding supplement (GLUCERNA SHAKE)  237 mL Oral TID BM   multivitamin with minerals  1 tablet Oral Daily   pantoprazole  40 mg Oral Daily   sodium chloride flush  3 mL Intravenous Q12H   traZODone  200 mg Oral QHS   Continuous Infusions:  sodium chloride     heparin 600 Units/hr (12/23/21 1417)    LOS: 3 days   Raiford Noble, DO [facility] Available via Epic secure chat 7am-7pm After these hours, please refer to coverage provider listed on [email or web address] 12/23/2021, 4:20 PM

## 2021-12-23 NOTE — Anesthesia Preprocedure Evaluation (Signed)
Anesthesia Evaluation  Patient identified by MRN, date of birth, ID band Patient awake    Reviewed: Allergy & Precautions, H&P , NPO status , Patient's Chart, lab work & pertinent test results  Airway Mallampati: II  TM Distance: >3 FB Neck ROM: Full    Dental no notable dental hx.    Pulmonary Current Smoker and Patient abstained from smoking.   Pulmonary exam normal breath sounds clear to auscultation       Cardiovascular hypertension, Normal cardiovascular exam Rhythm:Regular Rate:Normal     Neuro/Psych   Anxiety Depression    negative neurological ROS     GI/Hepatic ,GERD  ,,(+)     substance abuse  alcohol use  Endo/Other  diabetes, Type 2    Renal/GU Renal InsufficiencyRenal disease  negative genitourinary   Musculoskeletal negative musculoskeletal ROS (+)    Abdominal   Peds negative pediatric ROS (+)  Hematology  (+) Blood dyscrasia, anemia   Anesthesia Other Findings   Reproductive/Obstetrics negative OB ROS                             Anesthesia Physical Anesthesia Plan  ASA: 3  Anesthesia Plan: MAC   Post-op Pain Management: Minimal or no pain anticipated   Induction: Intravenous  PONV Risk Score and Plan: 2 and Propofol infusion and Treatment may vary due to age or medical condition  Airway Management Planned: Simple Face Mask  Additional Equipment:   Intra-op Plan:   Post-operative Plan:   Informed Consent: I have reviewed the patients History and Physical, chart, labs and discussed the procedure including the risks, benefits and alternatives for the proposed anesthesia with the patient or authorized representative who has indicated his/her understanding and acceptance.     Dental advisory given  Plan Discussed with: CRNA and Surgeon  Anesthesia Plan Comments:        Anesthesia Quick Evaluation

## 2021-12-24 ENCOUNTER — Encounter: Payer: Self-pay | Admitting: Critical Care Medicine

## 2021-12-24 ENCOUNTER — Ambulatory Visit: Payer: Commercial Managed Care - HMO | Admitting: Critical Care Medicine

## 2021-12-24 DIAGNOSIS — E162 Hypoglycemia, unspecified: Secondary | ICD-10-CM | POA: Diagnosis not present

## 2021-12-24 DIAGNOSIS — K222 Esophageal obstruction: Secondary | ICD-10-CM | POA: Diagnosis not present

## 2021-12-24 DIAGNOSIS — N179 Acute kidney failure, unspecified: Secondary | ICD-10-CM | POA: Diagnosis not present

## 2021-12-24 DIAGNOSIS — R1319 Other dysphagia: Secondary | ICD-10-CM | POA: Diagnosis not present

## 2021-12-24 DIAGNOSIS — R630 Anorexia: Secondary | ICD-10-CM

## 2021-12-24 LAB — CBC WITH DIFFERENTIAL/PLATELET
Abs Immature Granulocytes: 0.02 10*3/uL (ref 0.00–0.07)
Basophils Absolute: 0 10*3/uL (ref 0.0–0.1)
Basophils Relative: 1 %
Eosinophils Absolute: 0.4 10*3/uL (ref 0.0–0.5)
Eosinophils Relative: 5 %
HCT: 28 % — ABNORMAL LOW (ref 36.0–46.0)
Hemoglobin: 8.5 g/dL — ABNORMAL LOW (ref 12.0–15.0)
Immature Granulocytes: 0 %
Lymphocytes Relative: 36 %
Lymphs Abs: 2.7 10*3/uL (ref 0.7–4.0)
MCH: 22.9 pg — ABNORMAL LOW (ref 26.0–34.0)
MCHC: 30.4 g/dL (ref 30.0–36.0)
MCV: 75.5 fL — ABNORMAL LOW (ref 80.0–100.0)
Monocytes Absolute: 0.6 10*3/uL (ref 0.1–1.0)
Monocytes Relative: 8 %
Neutro Abs: 3.8 10*3/uL (ref 1.7–7.7)
Neutrophils Relative %: 50 %
Platelets: 365 10*3/uL (ref 150–400)
RBC: 3.71 MIL/uL — ABNORMAL LOW (ref 3.87–5.11)
RDW: 22.5 % — ABNORMAL HIGH (ref 11.5–15.5)
WBC: 7.5 10*3/uL (ref 4.0–10.5)
nRBC: 0 % (ref 0.0–0.2)

## 2021-12-24 LAB — COMPREHENSIVE METABOLIC PANEL
ALT: 24 U/L (ref 0–44)
AST: 22 U/L (ref 15–41)
Albumin: 3 g/dL — ABNORMAL LOW (ref 3.5–5.0)
Alkaline Phosphatase: 50 U/L (ref 38–126)
Anion gap: 8 (ref 5–15)
BUN: 14 mg/dL (ref 6–20)
CO2: 21 mmol/L — ABNORMAL LOW (ref 22–32)
Calcium: 9.1 mg/dL (ref 8.9–10.3)
Chloride: 111 mmol/L (ref 98–111)
Creatinine, Ser: 0.65 mg/dL (ref 0.44–1.00)
GFR, Estimated: >60 mL/min (ref >60)
Glucose, Bld: 100 mg/dL — ABNORMAL HIGH (ref 70–99)
Potassium: 3.5 mmol/L (ref 3.5–5.1)
Sodium: 140 mmol/L (ref 135–145)
Total Bilirubin: 0.3 mg/dL (ref 0.3–1.2)
Total Protein: 6.6 g/dL (ref 6.5–8.1)

## 2021-12-24 LAB — PHOSPHORUS: Phosphorus: 3.9 mg/dL (ref 2.5–4.6)

## 2021-12-24 LAB — HEPARIN LEVEL (UNFRACTIONATED): Heparin Unfractionated: 0.26 IU/mL — ABNORMAL LOW (ref 0.30–0.70)

## 2021-12-24 LAB — HEMOGLOBIN A1C
Hgb A1c MFr Bld: 5.9 % — ABNORMAL HIGH (ref 4.8–5.6)
Mean Plasma Glucose: 123 mg/dL

## 2021-12-24 LAB — GLUCOSE, CAPILLARY
Glucose-Capillary: 100 mg/dL — ABNORMAL HIGH (ref 70–99)
Glucose-Capillary: 174 mg/dL — ABNORMAL HIGH (ref 70–99)

## 2021-12-24 LAB — MAGNESIUM: Magnesium: 1.7 mg/dL (ref 1.7–2.4)

## 2021-12-24 MED ORDER — RIVAROXABAN 20 MG PO TABS
20.0000 mg | ORAL_TABLET | Freq: Every day | ORAL | Status: DC
  Administered 2021-12-24: 20 mg via ORAL
  Filled 2021-12-24: qty 1

## 2021-12-24 MED ORDER — ADULT MULTIVITAMIN W/MINERALS CH: 1.0000 | ORAL_TABLET | Freq: Every day | ORAL | Status: AC

## 2021-12-24 MED ORDER — PANTOPRAZOLE SODIUM 40 MG PO TBEC: 40.0000 mg | DELAYED_RELEASE_TABLET | Freq: Every day | ORAL | 4 refills | Status: DC

## 2021-12-24 MED ORDER — FERROUS SULFATE 325 (65 FE) MG PO TBEC: 325.0000 mg | DELAYED_RELEASE_TABLET | Freq: Every day | ORAL | 3 refills | Status: DC

## 2021-12-24 MED ORDER — ACETAMINOPHEN 500 MG PO TABS
1000.0000 mg | ORAL_TABLET | Freq: Four times a day (QID) | ORAL | Status: DC | PRN
  Administered 2021-12-24: 1000 mg via ORAL
  Filled 2021-12-24: qty 2

## 2021-12-24 NOTE — Progress Notes (Signed)
   Patient Name: Stacy [name] Date of Encounter: 12/24/2021, 10:28 AM    Subjective  Tolerated breakfast well.  Status post EGD and colonoscopy yesterday.   Objective  BP 137/89 (BP Location: Left Arm)   Pulse (!) 106   Temp 97.9 F (36.6 C) (Oral)   Resp 16   Ht 5' 2.99" (1.6 m)   Wt 59.1 kg   SpO2 100%   BMI 23.09 kg/m  No acute distress  Hemoglobin 8.5 today    Assessment and Plan  Iron deficiency anemia Anorexia and weight loss negative GI workup CT Hiatal hernia with mild esophageal stricture dilated yesterday question Cameron erosions which could contribute to iron deficiency anemia, prepyloric erythema biopsied Diverticulosis but no polyps on colonoscopy yesterday  It is fine to resume oral anticoagulation she should take an iron supplement and we will contact her from our office with results and any need for GI follow-up.  At this point I do not think she needs an office visit with GI.  She has primary care follow-up with Dr. Joya Gaskins pending she was to see him today but that will need to be rescheduled.  Signing off  Gatha Mayer, MD, [facility] See Shea Evans on call - gastroenterology for best contact person 12/24/2021 10:28 AM

## 2021-12-24 NOTE — Progress Notes (Signed)
ANTICOAGULATION CONSULT NOTE   Pharmacy Consult for heparin Indication: VTE treatment  Allergies  Allergen Reactions   Almond Meal Anaphylaxis and Swelling   Other Anaphylaxis    Almond Butter   Aspirin Nausea Only    Other Reaction(s): Not available, Unknown    Patient Measurements: Height: 5' 2.99" (160 cm) Weight: 59.1 kg (130 lb 4.7 oz) IBW/kg (Calculated) : 52.38 Heparin Dosing Weight: 59.1 kg  Vital Signs: Temp: 98.3 F (36.8 C) (12/06 0502) Temp Source: Oral (12/06 0502) BP: 145/91 (12/06 0502) Pulse Rate: 76 (12/06 0502)  Labs: Recent Labs    12/21/21 0857 12/21/21 0857 12/21/21 1857 12/22/21 0427 12/22/21 1050 12/23/21 0549 12/24/21 0626  HGB  --    < >  --  8.2*  --  8.7* 8.5*  HCT  --   --   --  26.9*  --  28.4* 28.0*  PLT  --   --   --  319  --  337 365  APTT 199*  --  120* 58*  --   --   --   HEPARINUNFRC >1.10*  --   --  0.43 0.47 0.32 0.26*  CREATININE  --   --   --  0.76  --  0.78 0.65   < > = values in this interval not displayed.     Estimated Creatinine Clearance: 64.2 mL/min (by C-G formula based on SCr of 0.65 mg/dL).  Assessment: 56 yo female with abnormal labs, hx of mild CAD, HTN, HLD, fatty liver disease, elevated LFTs, tobacco use, on xarelto for hx of DVT.  Due to AKI pharmacy consulted to dose heparin drip. LD xarelto 11/30 @ 2100  Today, 12/24/21 Daily heparin level slightly low this AM on 600 units/hr Hgb low but stable; PLT stable WNL AKI on admission resolved; SCr back to baseline Cleared per GI to resume Xarelto today  Goal of Therapy:  Heparin level 0.3-0.7 units/ml Monitor platelets by anticoagulation protocol: Yes   Plan:  Will not adjust heparin rate as transitioning back to Xarelto Resume Xarelto 20 mg daily Stop heparin with first dose of [place], PharmD, BCPS [phone] 12/24/2021, 8:18 AM

## 2021-12-25 ENCOUNTER — Encounter (HOSPITAL_COMMUNITY): Payer: Self-pay | Admitting: Internal Medicine

## 2021-12-25 ENCOUNTER — Telehealth: Payer: Self-pay

## 2021-12-25 LAB — SURGICAL PATHOLOGY

## 2021-12-25 NOTE — Telephone Encounter (Signed)
Transition Care Management Unsuccessful Follow-up Telephone Call  Date of discharge and from where:  12/24/2021, [facility]  Attempts:  1st Attempt  Reason for unsuccessful TCM follow-up call:  Voice mail full- [phone]. Need to schedule a follow up appointment with PCP

## 2021-12-25 NOTE — Discharge Summary (Signed)
Physician Discharge Summary   Stacy [name]:[id number] DOB: April 22, 1965 DOA: 12/19/2021  PCP: [name], MD  Admit date: 12/19/2021 Discharge date: 12/24/2021  Barriers to discharge: none  Admitted From: Home  Disposition:  Home Discharging physician: [name], MD  Recommendations for Outpatient Follow-up:  Adjust metformin further if needed; repeat A1c  Home Health:  Equipment/Devices:   Discharge Condition: stable CODE STATUS: Full Diet recommendation:  Diet Orders (From admission, onward)     Start     Ordered   12/24/21 0000  Diet general        12/24/21 1228            Hospital Course:  Hypoglycemia, improved -Suspect due to poor renal clearance of her antidiabetic medication of glimepiride in the setting of acute renal failure -CBGs improved prior to discharge - Glimepiride and actos discontinued at discharge - Continue on metformin -Repeat A1c in approximately 3 months   History of DVT (deep vein thrombosis) -Venous doppler on 07/2021 showed DVT of right popliteal vein -Resumed back on Xarelto at discharge   AKI Metabolic acidosis -Creatinine up to 3.40 from 0.63 baseline -Back to baseline prior to discharge   GERD  -Continue PPI   Moderate to large hiatal hernia -Stable hiatal hernia again appreciated   Hypertension -Continue home regimen   Hyponatremia -Improved and normalized with fluids and diet advancement   Iron deficiency and microcytic Anemia - s/p EGD and colonoscopy on 12/23/2021 -Small Cameron erosions appreciated on EGD   Dysphagia -Underwent EGD on 12/23/2021 and esophageal stenosis dilated   Thyroid Mass on Right  -Thyroid ultrasound performed on 12/23/2021 noting 2 nodules that did not meet criteria for biopsy or dedicated follow-up   Depression and Anxiety -Continue with Escitalopram 10 mg p.o. daily as well as Trazodone 200 mg p.o. nightly   Hypoalbuminemia -Continue diet   Hypophosphatemia -Repleted    Gout, improving  S/p steroid use - continue home regimen    The patient's chronic medical conditions were treated accordingly per the patient's home medication regimen except as noted.  On day of discharge, patient was felt deemed stable for discharge. Patient/family member advised to call PCP or come back to ER if needed.   Principal Diagnosis: Hypoglycemia  Discharge Diagnoses: Active Hospital Problems   Diagnosis Date Noted   Hypoglycemia 12/19/2021   Esophageal dysphagia 12/23/2021   Esophageal stricture 12/23/2021   History of DVT (deep vein thrombosis) 12/19/2021   Acute renal failure ([name]) 12/19/2020   GERD (gastroesophageal reflux disease)    Type 2 diabetes mellitus with other specified complication [facility])    Hypertension     Resolved Hospital Problems  No resolved problems to display.     Discharge Instructions     Diet general   Complete by: As directed    Increase activity slowly   Complete by: As directed       Allergies as of 12/24/2021       Reactions   Almond Meal Anaphylaxis, Swelling   Other Anaphylaxis   Almond Butter   Aspirin Nausea Only   Other Reaction(s): Not available, Unknown        Medication List     STOP taking these medications    glimepiride 1 MG tablet Commonly known as: AMARYL   nicotine 21 mg/24hr patch Commonly known as: Nicotine Step 1   nicotine polacrilex 2 MG lozenge Commonly known as: Nicotine Mini   pioglitazone 15 MG tablet Commonly known as: ACTOS  TAKE these medications    albuterol 108 (90 Base) MCG/ACT inhaler Commonly known as: VENTOLIN HFA Inhale 2 puffs into the lungs every 4 (four) hours as needed for wheezing or shortness of breath.   allopurinol 300 MG tablet Commonly known as: ZYLOPRIM Take 1 tablet (300 mg total) by mouth daily.   amLODipine 10 MG tablet Commonly known as: NORVASC Take 10 mg by mouth daily. What changed: Another medication with the same name was removed. Continue  taking this medication, and follow the directions you see here.   colchicine 0.6 MG tablet Take 1 tablet (0.6 mg total) by mouth 2 (two) times daily as needed (gout flare).   EPINEPHrine 0.3 mg/0.3 mL Soaj injection Commonly known as: EPI-PEN Insert into muscle once with any facial or neck swelling  Instruct in use   escitalopram 10 MG tablet Commonly known as: LEXAPRO Take 1 tablet (10 mg total) by mouth daily.   ferrous sulfate 325 (65 FE) MG EC tablet Take 1 tablet (325 mg total) by mouth daily with breakfast.   folic acid 1 MG tablet Commonly known as: FOLVITE Take 1 tablet (1 mg total) by mouth daily.   freestyle lancets Use as instructed   FreeStyle Precision Neo Test test strip Generic drug: glucose blood Use as instructed   metFORMIN 500 MG 24 hr tablet Commonly known as: GLUCOPHAGE-XR Take 2 tablets (1,000 mg total) by mouth daily with supper.   multivitamin with minerals Tabs tablet Take 1 tablet by mouth daily.   pantoprazole 40 MG tablet Commonly known as: PROTONIX Take 1 tablet (40 mg total) by mouth daily.   spironolactone 25 MG tablet Commonly known as: ALDACTONE Take 1 tablet (25 mg total) by mouth daily.   traZODone 100 MG tablet Commonly known as: DESYREL TAKE 1 TO 2 TABLETS BY MOUTH AS NEEDED FOR SLEEP What changed:  how much to take how to take this when to take this additional instructions   valsartan-hydrochlorothiazide 320-12.5 MG tablet Commonly known as: DIOVAN-HCT Take 1 tablet by mouth daily.   Xarelto 20 MG Tabs tablet Generic drug: rivaroxaban Take 20 mg by mouth every evening.        Allergies  Allergen Reactions   Almond Meal Anaphylaxis and Swelling   Other Anaphylaxis    Almond Butter   Aspirin Nausea Only    Other Reaction(s): Not available, Unknown    Consultations:   Procedures:   Discharge Exam: BP 137/89 (BP Location: Left Arm)   Pulse (!) 106   Temp 97.9 F (36.6 C) (Oral)   Resp 16   Ht 5'  2.99" (1.6 m)   Wt 59.1 kg   SpO2 100%   BMI 23.09 kg/m  Physical Exam Constitutional:      Appearance: Normal appearance.  HENT:     Head: Normocephalic and atraumatic.     Mouth/Throat:     Mouth: Mucous membranes are moist.  Eyes:     Extraocular Movements: Extraocular movements intact.  Cardiovascular:     Rate and Rhythm: Normal rate and regular rhythm.  Pulmonary:     Effort: Pulmonary effort is normal.     Breath sounds: Normal breath sounds.  Abdominal:     General: Bowel sounds are normal. There is no distension.     Palpations: Abdomen is soft.     Tenderness: There is no abdominal tenderness.  Musculoskeletal:        General: Normal range of motion.     Cervical back: Normal range of  motion.  Skin:    General: Skin is warm.  Neurological:     General: No focal deficit present.     Mental Status: She is alert.  Psychiatric:        Mood and Affect: Mood normal.      The results of significant diagnostics from this hospitalization (including imaging, microbiology, ancillary and laboratory) are listed below for reference.   Microbiology: Recent Results (from the past 240 hour(s))  MRSA Next Gen by PCR, Nasal     Status: None   Collection Time: 12/19/21  7:03 PM   Specimen: Nasal Mucosa; Nasal Swab  Result Value Ref Range Status   MRSA by PCR Next Gen NOT DETECTED NOT DETECTED Final    Comment: (NOTE) The GeneXpert MRSA Assay (FDA approved for NASAL specimens only), is one component of a comprehensive MRSA colonization surveillance program. It is not intended to diagnose MRSA infection nor to guide or monitor treatment for MRSA infections. Test performance is not FDA approved in patients less than 55 years old. Performed at [facility], [place] [address], [place], [place] [address]      Labs: BNP (last 3 results) No results for input(s): "BNP" in the last 8760 hours. Basic Metabolic Panel: Recent Labs  Lab 12/20/21 0735  12/20/21 0825 12/21/21 0548 12/22/21 0427 12/23/21 0549 12/24/21 0626  NA 134*  --  140 140 140 140  K 4.5  --  4.3 4.1 3.9 3.5  CL 101  --  109 112* 114* 111  CO2 26  --  25 23 20* 21*  GLUCOSE 154*  --  113* 115* 97 100*  BUN 57*  --  33* '18 10 14  '$ CREATININE 1.42*  --  0.99 0.76 0.78 0.65  CALCIUM 9.3  --  8.5* 8.1* 8.7* 9.1  MG  --  2.0 1.7 1.7 1.7 1.7  PHOS  --  2.6 2.1* 2.0* 3.2 3.9   Liver Function Tests: Recent Labs  Lab 12/20/21 0735 12/21/21 0548 12/22/21 0427 12/23/21 0549 12/24/21 0626  AST 14* '20 20 25 22  '$ ALT '12 17 19 22 24  '$ ALKPHOS 54 48 43 45 50  BILITOT 0.2* 0.2* 0.3 0.4 0.3  PROT 7.2 6.5 6.3* 6.7 6.6  ALBUMIN 3.3* 3.0* 2.9* 2.9* 3.0*   No results for input(s): "LIPASE", "AMYLASE" in the last 168 hours. No results for input(s): "AMMONIA" in the last 168 hours. CBC: Recent Labs  Lab 12/20/21 0735 12/21/21 0548 12/22/21 0427 12/23/21 0549 12/24/21 0626  WBC 8.9 8.3 7.9 7.4 7.5  NEUTROABS  --  4.2 3.9 3.9 3.8  HGB 9.4* 8.6* 8.2* 8.7* 8.5*  HCT 30.5* 28.0* 26.9* 28.4* 28.0*  MCV 73.3* 74.5* 75.1* 75.7* 75.5*  PLT 347 305 319 337 365   Cardiac Enzymes: No results for input(s): "CKTOTAL", "CKMB", "CKMBINDEX", "TROPONINI" in the last 168 hours. BNP: Invalid input(s): "POCBNP" CBG: Recent Labs  Lab 12/23/21 1106 12/23/21 1630 12/23/21 2110 12/24/21 0755 12/24/21 1117  GLUCAP 116* 155* 131* 100* 174*   D-Dimer No results for input(s): "DDIMER" in the last 72 hours. Hgb A1c Recent Labs    12/23/21 0549  HGBA1C 5.9*   Lipid Profile No results for input(s): "CHOL", "HDL", "LDLCALC", "TRIG", "CHOLHDL", "LDLDIRECT" in the last 72 hours. Thyroid function studies No results for input(s): "TSH", "T4TOTAL", "T3FREE", "THYROIDAB" in the last 72 hours.  Invalid input(s): "FREET3" Anemia work up No results for input(s): "VITAMINB12", "FOLATE", "FERRITIN", "TIBC", "IRON", "RETICCTPCT" in the last 72 hours.  Urinalysis    Component Value  Date/Time   COLORURINE YELLOW 12/19/2020 [place] 12/19/2020 0943   LABSPEC 1.020 12/19/2020 0943   PHURINE 6.0 12/19/2020 0943   GLUCOSEU NEGATIVE 12/19/2020 0943   HGBUR MODERATE (A) 12/19/2020 0943   BILIRUBINUR NEGATIVE 12/19/2020 0943   BILIRUBINUR neg 02/26/2014 [place] 12/19/2020 0943   PROTEINUR 30 (A) 12/19/2020 0943   UROBILINOGEN negative 02/26/2014 0952   NITRITE NEGATIVE 12/19/2020 [place] 12/19/2020 0943   Sepsis Labs Recent Labs  Lab 12/21/21 0548 12/22/21 0427 12/23/21 0549 12/24/21 0626  WBC 8.3 7.9 7.4 7.5   Microbiology Recent Results (from the past 240 hour(s))  MRSA Next Gen by PCR, Nasal     Status: None   Collection Time: 12/19/21  7:03 PM   Specimen: Nasal Mucosa; Nasal Swab  Result Value Ref Range Status   MRSA by PCR Next Gen NOT DETECTED NOT DETECTED Final    Comment: (NOTE) The GeneXpert MRSA Assay (FDA approved for NASAL specimens only), is one component of a comprehensive MRSA colonization surveillance program. It is not intended to diagnose MRSA infection nor to guide or monitor treatment for MRSA infections. Test performance is not FDA approved in patients less than 85 years old. Performed at [facility], [place] [address], [place], [place] [address]     Procedures/Studies: US THYROID  Result Date: 12/24/2021 CLINICAL DATA:  Goiter. EXAM: THYROID ULTRASOUND TECHNIQUE: Ultrasound examination of the thyroid gland and adjacent soft tissues was performed. COMPARISON:  01/09/2015 FINDINGS: Parenchymal Echotexture: Normal Isthmus: 0.9 cm Right lobe: 5.6 x 2.4 x 2.1 cm Left lobe: 0.2 x 2.4 x 2.2 cm _________________________________________________________ Estimated total number of nodules >/= 1 cm: 1 Number of spongiform nodules >/=  2 cm not described below (TR1): 0 Number of mixed cystic and solid nodules >/= 1.5 cm not described below ([place]): 0  _________________________________________________________ Nodule # 1: Location: Right; Mid Maximum size: 0.7 cm; Other 2 dimensions: 0.6 x 0.5 cm Composition: mixed cystic and solid (1) Echogenicity: isoechoic (1) Shape: not taller-than-wide (0) Margins: smooth (0) Echogenic foci: none (0) ACR TI-RADS total points: 2. ACR TI-RADS risk category: TR2 (2 points). ACR TI-RADS recommendations: This nodule does NOT meet TI-RADS criteria for biopsy or dedicated follow-up. _________________________________________________________ Nodule # 2: Location: Left; Inferior Maximum size: 1.1 cm; Other 2 dimensions: 0.8 x 1.0 cm Composition: cystic/almost completely cystic (0) Echogenicity: anechoic (0) Shape: not taller-than-wide (0) Margins: smooth (0) Echogenic foci: none (0) ACR TI-RADS total points: 0. ACR TI-RADS risk category: TR1 (0-1 points). ACR TI-RADS recommendations: This nodule does NOT meet TI-RADS criteria for biopsy or dedicated follow-up. _________________________________________________________ No enlarged or abnormal appearing lymph nodes are identified. IMPRESSION: Mild thyroid goiter with slightly increased thyroid volume since the prior study in 2016. Small bilateral thyroid nodules/cysts do not meet criteria for biopsy or dedicated follow-up. The above is in keeping with the ACR TI-RADS recommendations - J Am Coll Radiol 2017;14:587-595. Electronically Signed   By: Aletta Edouard M.D.   On: 12/24/2021 08:51   CT ABDOMEN PELVIS W CONTRAST  Result Date: 12/21/2021 CLINICAL DATA:  Unintentional weight loss. Hiatal hernia. Clinical suspicion for malignancy. EXAM: CT ABDOMEN AND PELVIS WITH CONTRAST TECHNIQUE: Multidetector CT imaging of the abdomen and pelvis was performed using the standard protocol following bolus administration of intravenous contrast. RADIATION DOSE REDUCTION: This exam was performed according to the departmental dose-optimization program which includes automated exposure control,  adjustment of the  mA and/or kV according to patient size and/or use of iterative reconstruction technique. CONTRAST:  69m OMNIPAQUE IOHEXOL 300 MG/ML  SOLN COMPARISON:  02/16/2018 FINDINGS: Lower Chest: No acute findings. Hepatobiliary: No hepatic masses identified. Gallbladder is unremarkable. No evidence of biliary ductal dilatation. Pancreas:  No mass or inflammatory changes. Spleen: Within normal limits in size and appearance. Adrenals/Urinary Tract: 3.2 x 2.9 cm right adrenal mass remains stable, consistent with benign adenoma (no followup imaging is recommended). No suspicious renal masses identified. No evidence of ureteral calculi or hydronephrosis. Stomach/Bowel: Moderate to large hiatal hernia is again seen. No evidence of obstruction, inflammatory process or abnormal fluid collections. Normal appendix visualized. Mild colonic diverticulosis is noted, however there is no evidence of diverticulitis. Vascular/Lymphatic: No pathologically enlarged lymph nodes. No acute vascular findings. Aortic atherosclerotic calcification incidentally noted. Reproductive: 1 cm calcified uterine fibroid remains stable. Adnexal regions are unremarkable. Other:  None. Musculoskeletal:  No suspicious bone lesions identified. IMPRESSION: No acute findings. No evidence of malignancy. Stable moderate to large hiatal hernia. Stable benign right adrenal adenoma (no followup imaging is recommended) . Stable small calcified uterine fibroid. Aortic Atherosclerosis (ICD10-I70.0). Electronically Signed   By: JMarlaine HindM.D.   On: 12/21/2021 16:49   DG Chest Portable 1 View  Result Date: 12/19/2021 CLINICAL DATA:  Weakness, chronic kidney disease in a 56year old female. EXAM: PORTABLE CHEST 1 VIEW COMPARISON:  Jun 12, 2021 FINDINGS: EKG leads project over the chest. Cardiomediastinal contours and hilar structures are normal. Moderate to large hiatal hernia potentially larger than on previous imaging projects in the retrocardiac  region. No signs of consolidation. No sign of pleural effusion. No pneumothorax. On limited assessment no acute skeletal process. IMPRESSION: 1. No acute cardiopulmonary disease. 2. Moderate to large hiatal hernia potentially larger than on previous imaging. Electronically Signed   By: GZetta BillsM.D.   On: 12/19/2021 12:47   VAS [place]NAL ARTERY DUPLEX  Result Date: 12/03/2021 ABDOMINAL VISCERAL Patient Name:  Stacy [name]  Date of Exam:   12/02/2021 Medical Rec #: [id number]       Accession #:    [id number] of Birth: 910-Mar-1967        Patient Gender: F Patient Age:   89years Exam Location:  [facility] Procedure:      VAS [place]NAL ARTERY DUPLEX Referring Phys: [id number] [place] -------------------------------------------------------------------------------- Indications: Hypertension High Risk Factors: Hypertension, hyperlipidemia, Diabetes, current smoker. Comparison       12/1/2022Previous exam on 07/05/2019 showed a 1-59% stenosis in Study:           LRA Performing Technologist: JLeavy CellaRDCS  Examination Guidelines: A complete evaluation includes B-mode imaging, spectral Doppler, color Doppler, and power Doppler as needed of all accessible portions of each vessel. Bilateral testing is considered an integral part of a complete examination. Limited examinations for reoccurring indications may be performed as noted.  Duplex Findings: +--------------------+--------+--------+------+--------+ Mesenteric          PSV cm/sEDV cm/sPlaqueComments +--------------------+--------+--------+------+--------+ Aorta Distal           73                          +--------------------+--------+--------+------+--------+ Celiac Artery Origin  113                          +--------------------+--------+--------+------+--------+ SMA Proximal          270  73                  +--------------------+--------+--------+------+--------+    +------------------+--------+--------+-------+  Right Renal ArteryPSV cm/sEDV cm/sComment +------------------+--------+--------+-------+ Origin               80      23           +------------------+--------+--------+-------+ Proximal             85      25           +------------------+--------+--------+-------+ Mid                  68      19           +------------------+--------+--------+-------+ Distal               76      23           +------------------+--------+--------+-------+ +-----------------+--------+--------+-------+ Left Renal ArteryPSV cm/sEDV cm/sComment +-----------------+--------+--------+-------+ Origin              54      16           +-----------------+--------+--------+-------+ Proximal            52      16           +-----------------+--------+--------+-------+ Mid                155      42           +-----------------+--------+--------+-------+ Distal              46      15           +-----------------+--------+--------+-------+ +------------+--------+--------+----+-----------+--------+--------+----+ Right KidneyPSV cm/sEDV cm/sRI  Left KidneyPSV cm/sEDV cm/sRI   +------------+--------+--------+----+-----------+--------+--------+----+ Upper Pole  47      16      0.67Upper Pole 28      10      0.65 +------------+--------+--------+----+-----------+--------+--------+----+ Mid         45      15      0.68Mid        34      14      0.57 +------------+--------+--------+----+-----------+--------+--------+----+ Lower Pole  25      10      0.59Lower Pole 25      9       0.64 +------------+--------+--------+----+-----------+--------+--------+----+ Hilar       39      16      0.58Hilar      41      9       0.78 +------------+--------+--------+----+-----------+--------+--------+----+ +------------------+----+------------------+-----+ Right Kidney          Left Kidney             +------------------+----+------------------+-----+ RAR                    RAR                     +------------------+----+------------------+-----+ RAR (manual)      0.74RAR (manual)      1.35  +------------------+----+------------------+-----+ Cortex            13.3Cortex            14.0  +------------------+----+------------------+-----+ Cortex thickness      Corex thickness         +------------------+----+------------------+-----+ Kidney length (cm)9.92Kidney length (cm)10.35 +------------------+----+------------------+-----+  Summary: Renal:  Right: Normal  right Resisitive Index. Normal size right kidney. RRV        flow present. No evidence of right renal artery stenosis.        Normal cortical thickness of right kidney. Left:  Normal left Resistive Index. Normal size of left kidney. LRV        flow present. 1-59% stenosis of the left renal artery. Normal        cortical thickness of the left kidney. Mesenteric: Normal Superior Mesenteric artery and Celiac artery findings.  *See table(s) above for measurements and observations.  Diagnosing physician: Quay Burow MD  Electronically signed by Quay Burow MD on 12/03/2021 at 8:49:03 PM.    Final    ECHOCARDIOGRAM COMPLETE  Result Date: 12/02/2021    ECHOCARDIOGRAM REPORT   Patient Name:   Stacy [name] [place] Date of Exam: 12/02/2021 Medical Rec #:  [id number]       Height:       63.0 in Accession #:    [id number]      Weight:       133.8 lb Date of Birth:  [birth year]        BSA:          1.630 m Patient Age:    56 years        BP:           138/92 mmHg Patient Gender: F               HR:           84 bpm. Exam Location:  Outpatient Procedure: 2D Echo, 3D Echo, Color Doppler, Cardiac Doppler and Strain Analysis Indications:    R06.02 SOB  History:        Patient has no prior history of Echocardiogram examinations.                 Risk Factors:Hypertension, Current Smoker, Dyslipidemia and                 Diabetes.  Sonographer:    Leavy Cella RDCS Referring Phys: [id number] TIFFANY [place]  IMPRESSIONS  1. Left ventricular ejection fraction, by estimation, is 60 to 65%. The left ventricle has normal function. The left ventricle has no regional wall motion abnormalities. There is mild concentric left ventricular hypertrophy. Left ventricular diastolic parameters were normal.  2. Right ventricular systolic function is normal. The right ventricular size is normal.  3. The mitral valve is normal in structure. No evidence of mitral valve regurgitation. No evidence of mitral stenosis.  4. The aortic valve is normal in structure. Aortic valve regurgitation is not visualized. No aortic stenosis is present.  5. The inferior vena cava is normal in size with greater than 50% respiratory variability, suggesting right atrial pressure of 3 mmHg. FINDINGS  Left Ventricle: Left ventricular ejection fraction, by estimation, is 60 to 65%. The left ventricle has normal function. The left ventricle has no regional wall motion abnormalities. The left ventricular internal cavity size was normal in size. There is  mild concentric left ventricular hypertrophy. Left ventricular diastolic parameters were normal. Right Ventricle: The right ventricular size is normal. No increase in right ventricular wall thickness. Right ventricular systolic function is normal. Left Atrium: Left atrial size was normal in size. Right Atrium: Right atrial size was normal in size. Pericardium: There is no evidence of pericardial effusion. Mitral Valve: The mitral valve is normal in structure. No evidence of mitral valve regurgitation. No evidence of mitral valve stenosis. Tricuspid Valve: The  tricuspid valve is normal in structure. Tricuspid valve regurgitation is not demonstrated. No evidence of tricuspid stenosis. Aortic Valve: The aortic valve is normal in structure. Aortic valve regurgitation is not visualized. No aortic stenosis is present. Pulmonic Valve: The pulmonic valve was normal in structure. Pulmonic valve regurgitation is not  visualized. No evidence of pulmonic stenosis. Aorta: The aortic root is normal in size and structure. Venous: The inferior vena cava is normal in size with greater than 50% respiratory variability, suggesting right atrial pressure of 3 mmHg. IAS/Shunts: No atrial level shunt detected by color flow Doppler.  LEFT VENTRICLE PLAX 2D LVIDd:         3.59 cm   Diastology LVIDs:         2.45 cm   LV e' medial:    6.42 cm/s LV PW:         1.31 cm   LV E/e' medial:  14.8 LV IVS:        1.23 cm   LV e' lateral:   9.36 cm/s LVOT diam:     2.00 cm   LV E/e' lateral: 10.1 LV SV:         60 LV SV Index:   37 LVOT Area:     3.14 cm                           3D Volume EF:                          3D EF:        66 %                          LV EDV:       79 ml                          LV ESV:       27 ml                          LV SV:        52 ml RIGHT VENTRICLE RV Basal diam:  3.05 cm RV Mid diam:    2.29 cm RV S prime:     12.50 cm/s TAPSE (M-mode): 2.8 cm LEFT ATRIUM             Index        RIGHT ATRIUM           Index LA diam:        3.40 cm 2.09 cm/m   RA Area:     13.20 cm LA Vol (A2C):   39.1 ml 23.99 ml/m  RA Volume:   32.20 ml  19.75 ml/m LA Vol (A4C):   53.1 ml 32.58 ml/m LA Biplane Vol: 47.4 ml 29.08 ml/m  AORTIC VALVE LVOT Vmax:   96.80 cm/s LVOT Vmean:  61.600 cm/s LVOT VTI:    0.190 m  AORTA Ao Root diam: 3.10 cm Ao Asc diam:  3.00 cm MITRAL VALVE                TRICUSPID VALVE MV Area (PHT): 3.72 cm     TR Peak grad:   22.1 mmHg MV Decel Time: 204 msec     TR Vmax:        235.00 cm/s  MV E velocity: 95.00 cm/s MV A velocity: 110.00 cm/s  SHUNTS MV E/A ratio:  0.86         Systemic VTI:  0.19 m                             Systemic Diam: 2.00 cm Godfrey Pick Tobb DO Electronically signed by Berniece Salines DO Signature Date/Time: 12/02/2021/1:33:44 PM    Final      Time coordinating discharge: Over 30 minutes    [name], MD  [facility] 12/25/2021, 5:09 PM

## 2021-12-29 ENCOUNTER — Encounter: Payer: Self-pay | Admitting: Internal Medicine

## 2021-12-29 ENCOUNTER — Telehealth: Payer: Self-pay

## 2021-12-29 NOTE — Telephone Encounter (Signed)
Transition Care Management Unsuccessful Follow-up Telephone Call  Date of discharge and from where:  12/24/2021, [facility]  Attempts:  2nd Attempt  Reason for unsuccessful TCM follow-up call:  Unable to reach patient - [phone]. I tried 3 times and the recording stated that the call could not be completed at this time

## 2021-12-30 ENCOUNTER — Telehealth: Payer: Self-pay

## 2021-12-30 NOTE — Telephone Encounter (Signed)
Transition Care Management Unsuccessful Follow-up Telephone Call  Date of discharge and from where:  12/24/2021, [facility]  Attempts:  3rd Attempt  Reason for unsuccessful TCM follow-up call:  Unable to reach patient- - [phone]. I tried twice and the recording stated that the call could not be completed at this time   I was able to leave a message on her sister, Cassandra's phone [phone] with a call back requested.   Letter also sent to patient requesting she contact [place] to schedule a hospital follow up appointment as we have not been able to reach her.

## 2022-01-21 ENCOUNTER — Other Ambulatory Visit: Payer: Self-pay | Admitting: Critical Care Medicine

## 2022-02-26 ENCOUNTER — Encounter: Payer: Self-pay | Admitting: Critical Care Medicine

## 2022-02-26 ENCOUNTER — Telehealth: Payer: Self-pay | Admitting: Critical Care Medicine

## 2022-02-26 ENCOUNTER — Ambulatory Visit: Payer: Medicaid Other | Attending: Critical Care Medicine | Admitting: Critical Care Medicine

## 2022-02-26 VITALS — BP 117/78 | HR 102 | Ht 63.0 in | Wt 131.6 lb

## 2022-02-26 DIAGNOSIS — M109 Gout, unspecified: Secondary | ICD-10-CM | POA: Diagnosis not present

## 2022-02-26 DIAGNOSIS — M659 Synovitis and tenosynovitis, unspecified: Secondary | ICD-10-CM

## 2022-02-26 DIAGNOSIS — Z720 Tobacco use: Secondary | ICD-10-CM

## 2022-02-26 DIAGNOSIS — Z09 Encounter for follow-up examination after completed treatment for conditions other than malignant neoplasm: Secondary | ICD-10-CM | POA: Diagnosis not present

## 2022-02-26 DIAGNOSIS — F17210 Nicotine dependence, cigarettes, uncomplicated: Secondary | ICD-10-CM | POA: Diagnosis not present

## 2022-02-26 DIAGNOSIS — F1029 Alcohol dependence with unspecified alcohol-induced disorder: Secondary | ICD-10-CM

## 2022-02-26 DIAGNOSIS — E162 Hypoglycemia, unspecified: Secondary | ICD-10-CM

## 2022-02-26 DIAGNOSIS — K760 Fatty (change of) liver, not elsewhere classified: Secondary | ICD-10-CM | POA: Diagnosis not present

## 2022-02-26 DIAGNOSIS — E785 Hyperlipidemia, unspecified: Secondary | ICD-10-CM | POA: Insufficient documentation

## 2022-02-26 DIAGNOSIS — K219 Gastro-esophageal reflux disease without esophagitis: Secondary | ICD-10-CM | POA: Diagnosis not present

## 2022-02-26 DIAGNOSIS — R053 Chronic cough: Secondary | ICD-10-CM | POA: Insufficient documentation

## 2022-02-26 DIAGNOSIS — Z122 Encounter for screening for malignant neoplasm of respiratory organs: Secondary | ICD-10-CM

## 2022-02-26 DIAGNOSIS — I25118 Atherosclerotic heart disease of native coronary artery with other forms of angina pectoris: Secondary | ICD-10-CM

## 2022-02-26 DIAGNOSIS — Z86718 Personal history of other venous thrombosis and embolism: Secondary | ICD-10-CM | POA: Diagnosis not present

## 2022-02-26 DIAGNOSIS — I10 Essential (primary) hypertension: Secondary | ICD-10-CM

## 2022-02-26 DIAGNOSIS — F331 Major depressive disorder, recurrent, moderate: Secondary | ICD-10-CM

## 2022-02-26 DIAGNOSIS — Z79899 Other long term (current) drug therapy: Secondary | ICD-10-CM | POA: Insufficient documentation

## 2022-02-26 DIAGNOSIS — F1020 Alcohol dependence, uncomplicated: Secondary | ICD-10-CM | POA: Diagnosis not present

## 2022-02-26 DIAGNOSIS — R062 Wheezing: Secondary | ICD-10-CM | POA: Diagnosis not present

## 2022-02-26 DIAGNOSIS — E1169 Type 2 diabetes mellitus with other specified complication: Secondary | ICD-10-CM | POA: Diagnosis not present

## 2022-02-26 DIAGNOSIS — I82411 Acute embolism and thrombosis of right femoral vein: Secondary | ICD-10-CM

## 2022-02-26 DIAGNOSIS — R1319 Other dysphagia: Secondary | ICD-10-CM | POA: Diagnosis not present

## 2022-02-26 DIAGNOSIS — I701 Atherosclerosis of renal artery: Secondary | ICD-10-CM

## 2022-02-26 DIAGNOSIS — E7800 Pure hypercholesterolemia, unspecified: Secondary | ICD-10-CM

## 2022-02-26 DIAGNOSIS — Z7901 Long term (current) use of anticoagulants: Secondary | ICD-10-CM | POA: Insufficient documentation

## 2022-02-26 DIAGNOSIS — M65962 Unspecified synovitis and tenosynovitis, left lower leg: Secondary | ICD-10-CM

## 2022-02-26 DIAGNOSIS — M0580 Other rheumatoid arthritis with rheumatoid factor of unspecified site: Secondary | ICD-10-CM

## 2022-02-26 DIAGNOSIS — R0602 Shortness of breath: Secondary | ICD-10-CM | POA: Insufficient documentation

## 2022-02-26 MED ORDER — XARELTO 20 MG PO TABS: 20.0000 mg | ORAL_TABLET | Freq: Every evening | ORAL | 2 refills | Status: DC

## 2022-02-26 MED ORDER — ALBUTEROL SULFATE HFA 108 (90 BASE) MCG/ACT IN AERS: 2.0000 | INHALATION_SPRAY | RESPIRATORY_TRACT | 1 refills | Status: AC | PRN

## 2022-02-26 MED ORDER — ALLOPURINOL 300 MG PO TABS: 300.0000 mg | ORAL_TABLET | Freq: Every day | ORAL | 4 refills | Status: DC

## 2022-02-26 MED ORDER — TRAZODONE HCL 100 MG PO TABS: 200.0000 mg | ORAL_TABLET | Freq: Every day | ORAL | 1 refills | Status: DC

## 2022-02-26 MED ORDER — PANTOPRAZOLE SODIUM 40 MG PO TBEC: 40.0000 mg | DELAYED_RELEASE_TABLET | Freq: Every day | ORAL | 4 refills | Status: DC

## 2022-02-26 MED ORDER — FLUTICASONE PROPIONATE 50 MCG/ACT NA SUSP: 2.0000 | Freq: Every day | NASAL | 6 refills | Status: DC

## 2022-02-26 MED ORDER — SPIRONOLACTONE 25 MG PO TABS: 25.0000 mg | ORAL_TABLET | Freq: Every day | ORAL | 3 refills | Status: DC

## 2022-02-26 MED ORDER — AMLODIPINE BESYLATE 10 MG PO TABS: 10.0000 mg | ORAL_TABLET | Freq: Every day | ORAL | 2 refills | Status: DC

## 2022-02-26 MED ORDER — VALSARTAN 320 MG PO TABS: 320.0000 mg | ORAL_TABLET | Freq: Every day | ORAL | 3 refills | Status: DC

## 2022-02-26 MED ORDER — METFORMIN HCL ER 500 MG PO TB24: 1000.0000 mg | ORAL_TABLET | Freq: Every day | ORAL | 3 refills | Status: DC

## 2022-02-26 MED ORDER — ESCITALOPRAM OXALATE 20 MG PO TABS: 20.0000 mg | ORAL_TABLET | Freq: Every day | ORAL | 2 refills | Status: DC

## 2022-02-26 MED ORDER — CEFDINIR 300 MG PO CAPS: 300.0000 mg | ORAL_CAPSULE | Freq: Two times a day (BID) | ORAL | 0 refills | Status: DC

## 2022-02-26 NOTE — Assessment & Plan Note (Signed)
Blood pressure at goal however concerned about use of hydrochlorothiazide she is on the Aldactone  Plan is to change valsartan HCT to plain valsartan and reassess renal function

## 2022-02-26 NOTE — Assessment & Plan Note (Signed)
Blood sugars stable continue metformin renal function allows metformin use

## 2022-02-26 NOTE — Assessment & Plan Note (Signed)
Status post dilation with improved swallowing

## 2022-02-26 NOTE — Assessment & Plan Note (Signed)
Discussed smoking cessation. Rx sent for nicotine patches and lozenges. Referral to [facility], LCSW for behavioral therapy.      Current smoking consumption amount: 1 ppd  Dicsussion on advise to quit smoking and smoking impacts: CV lung impacts  Patient's willingness to quit:  Interested   Methods to quit smoking discussed:  Behavioral mod /meds  Medication management of smoking session drugs discussed:nicotine replacement: combo of nicotine patch and lozenge  Resources provided:  AVS   Setting quit date not established  Follow-up arranged 2 mo   Time spent counseling the patient:  5 min

## 2022-02-26 NOTE — Assessment & Plan Note (Signed)
Will increase Lexapro to 20 mg daily

## 2022-02-26 NOTE — Assessment & Plan Note (Signed)
Currently arthritis stable we will monitor

## 2022-02-26 NOTE — Progress Notes (Signed)
NEW Patient Office Visit  Subjective:  Patient ID: Stacy [name], female    DOB: Jan 15, 1966  Age: 57 y.o. MRN: [id number]  CC:  Chief Complaint  Patient presents with   Hospitalization Follow-up   Cough    HPI 01/2021  [name] presents for follow up after hospitalization for an AKI/acute renal failure and electrolyte imbalance that resulted from severe vomiting. She was concurrently treated for an acute gout flare at that time. While in the hospital, she received nephrology and orthopedics consult. Her kidney function improved during her hospital stay, and she was cleared by nephrology for discharge. She received bilateral knee arthrocentesis and steroid injection of her knees to treat her gout. She is in need of an MRI for her knees, and has just gotten insurance approval for this procedure. She is attempting to schedule the MRI. The following includes the summary from discharge:   "57 y.o. female with medical history significant of HTN, HLD, GERD, T2DM, depression with anxiety, gout, nonobstructive CAD, left renal artery stenosis, tobacco abuse treated with abdominal pain, nausea and vomiting and not feeling well along with bilateral knee pain.  On presentation, she was slightly hypotensive, tachycardic.  WBC of 12.4; BUN of 77, creatinine of 9.5, lipase of 62.  Chest x-ray showed no acute findings.  Ultrasound of abdomen showed no acute findings to explain her abdominal pain.  She was started on IV fluids.  Nephrology was consulted.  During the hospitalization, her condition has gradually improved; creatinine is improving.  Orthopedics was consulted and patient underwent bilateral knee arthrocentesis and steroid injection; synovial fluid was consistent with gout flare.  She was also treated with oral prednisone.  Currently her renal function has much improved, nephrology has cleared the patient for discharge.  Currently she is hemodynamically stable, tolerating diet.  She will be  discharged home today on oral prednisone and allopurinol will be started.  Follow-up with PCP and orthopedics.  Follow-up with nephrology if needed."  Today, the patient states that she has improved since her hospital stay. She has had follow ups with multiple providers for her other medical issues. She followed up with cardiology on 01/24/21 and was started on spironolactone. She has a diagnosis of mild nonobstructive CAD that is being medically managed at this point in time. She previously took a statin for this issue, but it was stopped during hospitalization due to elevated liver transaminase. She has been referred to the lipid clinic for further management of this issue. She followed up with endocrinology for her diabetes earlier today. She is currently on Amaryl and Actos, and metformin was added back to her regimen today. She checks her blood sugars in the morning, and reports they have typically been between 105-110 mg/dL.   [name] gout symptoms remain troublesome. She is having significant pain in her right knee from the gout flare. She is taking Vicodin for this. She is unable to do any weight-bearing on this leg. She also has pain from the gout flare in her right foot. Her other concern today is that she is having significant trouble with sleeping. She has difficulty falling asleep and staying asleep, and this has been significantly impacting her mood. She has experienced racing thoughts when trying to fall asleep, and she has felt down and depressed.  The patient states that she is drinking less alcohol than previously. She now drinks two whiskeys 3-4 times per week, rather than every day. She still smokes, less than 1 PPD. She  states she has been smoking a little more previously due to feelings of stress.  Notes reviewed below :  Cardiology OV 01/24/21.  Hosp DC from early 12/2020  Admit date: 12/19/2020 Discharge date: 12/22/2020   Admitted From: Home Disposition: Home   Recommendations  for Outpatient Follow-up:  Follow up with PCP in 1 week with repeat CBC/BMP Outpatient follow-up with nephrology if needed Outpatient follow-up with orthopedics/Dr. Lorin Mercy Follow up in ED if symptoms worsen or new appear     Home Health: No Equipment/Devices: None   Discharge Condition: Stable CODE STATUS: Full Diet recommendation: Heart healthy/carb modified   Brief/Interim Summary: 57 y.o. female with medical history significant of HTN, HLD, GERD, T2DM, depression with anxiety, gout, nonobstructive CAD, left renal artery stenosis, tobacco abuse treated with abdominal pain, nausea and vomiting and not feeling well along with bilateral knee pain.  On presentation, she was slightly hypotensive, tachycardic.  WBC of 12.4; BUN of 77, creatinine of 9.5, lipase of 62.  Chest x-ray showed no acute findings.  Ultrasound of abdomen showed no acute findings to explain her abdominal pain.  She was started on IV fluids.  Nephrology was consulted.  During the hospitalization, her condition has gradually improved; creatinine is improving.  Orthopedics was consulted and patient underwent bilateral knee arthrocentesis and steroid injection; synovial fluid was consistent with gout flare.  She was also treated with oral prednisone.  Currently her renal function has much improved, nephrology has cleared the patient for discharge.  Currently she is hemodynamically stable, tolerating diet.  She will be discharged home today on oral prednisone and allopurinol will be started.  Follow-up with PCP and orthopedics.  Follow-up with nephrology if needed.   Discharge Diagnoses:    Acute kidney injury/acute renal failure Anion gap metabolic acidosis -Possibly from ATN from dehydration and hypotension -Presented with creatinine of 9.5; treated with IV fluids.  Creatinine much improved to 1.57 today.  - Renal ultrasound negative for hydronephrosis  -nephrology following and has cleared the patient for discharge.   Outpatient follow-up with nephrology if needed. -Outpatient follow-up of BMP by PCP   Possible UTI -Present on admission.  Treated with 3 days of Rocephin.  No need for any further antibiotics on discharge.  Currently afebrile.   Bilateral knee pain and swelling/?  Acute gout flare -Patient states that she has had similar symptoms in the past requiring arthrocentesis which was consistent with acute gout.   - Avoid colchicine because of recent nausea and vomiting -Status post bilateral arthrocentesis by orthopedics on 12/20/2020: Synovial fluid analysis consistent with acute gout.  Bilateral knees were injected with steroids per orthopedics on 12/20/2020. -Continue oral prednisone on discharge 40 mg daily for 7 days.  Outpatient follow-up with orthopedics.     Nausea/vomiting -Questionable cause.  Ultrasound abdomen was negative for acute cause/gallbladder disease. -Much improved.  Currently tolerating diet.   Right upper quadrant pain/shoulder pain -Might be musculoskeletal as well.  Lipase only mildly elevated.  Troponin negative.  Continue current pain management.   Hypomagnesemia -Resolved   Mild hyperkalemia -Resolved   Leukocytosis -Possibly reactive.  Resolved   Renal artery stenosis -Renal duplex showed 1 to 59% stenosis of left renal 3.;  No evidence of right renal artery stenosis.   Normocytic anemia -Questionable cause.  Hemoglobin stable.  Outpatient follow-up   Fatty liver  Mildly elevated AST -Possibly from alcohol use.  Outpatient follow-up with GI.  Abstain from alcohol   Hypertension -Blood pressure on the lower side.  Antihypertensives  on hold   Hyperlipidemia -Statin on hold because of slightly elevated LFTs.  Outpatient follow-up with PCP.   GERD -Has completed Protonix treatment as an outpatient.  Follow-up with PCP.   Depression with anxiety -Stable.  Continue Remeron   Adrenal mass, right -Has outpatient follow-up   Tobacco use -Continue  nicotine patch.  Patient was counseled regarding cessation by admitting hospitalist  Cards 1/6    Essential hypertension: Medications were held during hospitalization 12/22 due to acute renal failure and hypotension. She was restarted on clonidine 0.2 mg 3 times a day.  Initial clinic blood pressure is elevated, however on my recheck it has actually decreased to 100/64.  She reports high blood pressure readings at home.  She would like to take as few pills as possible and would like something that may help with lower extremity edema.  We will restart spironolactone 25 mg once daily and reduce clonidine to 0.2 mg twice daily.  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  We will plan to follow-up on her blood pressure in 3 to 6 months depending on her availability.  She reports she currently is missing a lot of work due to multiple appointments and recent hospitalization.   Mild nonobstructive CAD: Coronary CT March 2021 revealed mild disease in the LAD and LCx.  Her coronary calcium score is 50.4, 94th percentile for age/sex, mild calcification of the aortic root.  Medical therapy recommended.  She denies chest pain, dyspnea or other symptoms concerning for angina.  No further ischemic evaluation warranted at this time.  Her statin was stopped during hospitalization 12/22 due to elevated transaminase.  This was also a problem in the past for her.  We will refer her to lipid clinic for management of lipids.  Aspirin was stopped during hospitalization due to anemia.  Would favor restarting aspirin 81 mg at next visit if blood counts have stabilized.   Hyperlipidemia LDL goal < 70: LDL 75 6/22.  She was previously on atorvastatin 40 mg.  As noted above statin was stopped during recent hospitalization due to elevated transaminase.  We will refer her to lipid clinic for management.   Bilateral leg edema: She has 1+ pitting edema in both  lower extremities.  She is having a significant amount of right knee swelling and pain due to gout.  She requests a blood pressure medicine that may help reduce some of the swelling.  We will restart spironolactone 25 mg as noted above she wants to take as few pills as possible and this is a potassium sparing diuretic.  Advised that she may not notice significant improvement in joint swelling with spironolactone.  Encouraged leg elevation (toes to nose) to help decrease leg swelling.   Difficulty sleeping: She states she is having difficulty sleeping because her mind is racing and she is having knee pain and requests a prescription for Ambien. States melatonin does not work for her. I encouraged her to use nonpharmacologic methods to help her sleep and suggested the Calm app or other methods that may help her calm her mind before going to sleep.  Encouraged leg elevation to help with pain and swelling.   Tobacco abuse: She continues to smoke less than 1/2 packs/day. Has reduced daytime smoking. Feels that her son who has recently moved back to [place] may help her quit because he is a big encouragement. Complete cessation advised.   04/07/21  Patient seen in return follow-up.  Patient has hypertension coronary artery calcification diabetes on oral therapy.  On arrival blood pressure 134/90 blood sugar 152.  Patient's had low blood sugars at times.  She is also had low blood pressures on clonidine therefore this drug was stopped.  She does maintain low-dose Aldactone.  She had increased pain and swelling in the right thigh and orthopedics appropriately ordered ultrasound of the lower extremities finding DVT in the right iliac vein and this was on February 24 of this year..  She has now been on the Xarelto.  She is completing the starter pack.  She has had no bleeding problems on Xarelto.  The leg pain is improved.  She still smoking less than 1/2 pack a day of cigarettes.  She was drinking 2 Crown Royal's  a day but has reduced her intake.  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  She lost her job because of all her health issues she was not able to attend regularly at work.  Patient did receive a Pap smear and it was normal.  She follows with endocrinology.  She is also been counseling with our licensed clinical social worker with her depression.  She continues to have insomnia and is requesting a higher dose of the trazodone.  Appetite is poor.  She would like some health screening labs obtained at this visit.  06/12/21 This patient is seen in return for follow-up is a 57 year old female with type 2 diabetes ethanol use tobacco use hypertension.  On arrival blood pressure is elevated 150/80 and she is having difficulty maintaining her diabetic program.  She is losing significant amounts of weight and has had a chronic cough.  We gave her a course of azithromycin a week ago and it persists.  She has some postnasal drainage and significant allergy symptoms.  She is drinking 3 [place] whiskeys daily.  She struggled to get off the alcohol.  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  She has been on Xarelto therapy now for 2 months.  Patient's had no bleeding with Xarelto  Patient continues to smoke a pack a day of cigarettes  Patient does not follow a healthy diet  Patient follows with endocrinology and they have her on oral Actos Amaryl and metformin.  She is often holding these medications.  She takes the medication she has severe nausea.  She is only eating 1 meal a day and cannot take in much food.  Blood pressure medicines now include the clonidine 0.2 mg twice daily and spironolactone 25 mg daily  She measures her blood pressure at home she gets around 140/90 usually.  7/27 Patient returns in  follow-up and had been out of medications for 2 weeks over the past week she was caring for her father.  She has been back on her medicines now for about 2 days and on arrival blood pressure elevated 167/103  Patient is no longer taking clonidine because of hypotension but she does maintain Aldactone 25 mg daily and valsartan 320 mg daily.  Patient did see our clinical pharmacist Lurena Joiner on June 30 is as documented below.  Saw Lurena Joiner 6/30 Kadijah R Zemaitis is a 57 y.o. female who presents for hypertension evaluation, education, and management.  PMH is significant for HTN, CAD, unilateral (L)  renal artery stenosis, migraines, VTE on anticoagulation with Xarelto, GERD, T2DM, bilateral knee synovitis, HLD, GOUT, MDD, tobacco use and alcohol use.    Patient was referred and last seen by Primary Care Provider, Dr. Joya Gaskins, on 06/12/2021.    At last visit, Dr. Joya Gaskins added back valsartan to her regimen.    Recent BP management hx:  Last seen by Dr. Oval Linsey in 2022. Was controlled on amlodipine, HCTZ, valsartan, and spiro. Was hospitalized 12/1 - 12/22/2020 with AKI and her antihypertensives were held. She was restarted on clonidine 0.2 mg TID at that time. NP at [facility] restarted spiro and decreased clonidine on 01/24/21. Of note, pt found to have 1+ pitting edema at that visit. Clonidine was reduced to 0.1 mg BID d/t hypotension by Dr. [name] on 02/04/21. We have seen her a couple of times since with BP being at or close to goal on 1/17 and 04/07/2021. Most recently, pt was seen 06/12/2021 and BP was above goal. Valsartan was restarted.    Today, patient arrives in good spirits and presents without assistance. Denies dizziness, headache, blurred vision, swelling.   Hypertension longstanding currently above goal on current medications. BP goal < 130/80 mmHg. Medication adherence appears appropriate with spiro and valsartan but she is not taking clonidine. RAAS agents can induce or worsen renal insufficiency in  patients with bilateral or several unilateral renal artery stenosis. This pt has a hx of moderate L renal artery stenosis but R renal artery looked good from doppler study in 2021. She has an elevated urine microalbumin but eGFR looks good. Electrolyte status is nl and stable. Will get labs today and if renal function is good now ~1 month since starting valsartan, will optimize to the 320 mg dose. Will not add back thiazide at this time d/t gout history. Will not add amlodipine at this time d/t LE edema but we can consider low dose in the future if we need continued improvement if she can tolerate. -Continued current regimen for now. Will increase valsartan depending on lab results.   -CMP14+eGFR -Counseled on lifestyle modifications for blood pressure control including reduced dietary sodium, increased exercise, adequate sleep. -Encouraged patient to check BP at home and bring log of readings to next visit. Counseled on proper use  Labs from that visit did show normal creatinine and potassium.  Valsartan was increased at that visit to 320 mg daily. Patient's been out of Xarelto for a month.  She had a previous deep venous thrombosis 6 months ago involving the right thigh femoral vein.  She cannot afford the Xarelto the co-pay is too high.  She may need warfarin if she is to stay on anticoagulation.  She states she does have a blood pressure meter at home at home it runs 150/90.  She states her depression is still severe despite 5 mg of Lexapro.  She still smoking 1 pack a day of cigarettes.  02/26/22 This patient is seen in return follow-up not seen since July of this year.  She has had 1 hospitalization since I have seen her.  Below is a documentation prior to that hospitalization from cardiology where there was a concern of decreased volume depletion and question of stopping hydrochlorothiazide labs were taken and she had significant renal insufficiency and hypotension she was then directed to the  emergency room the next day whereupon she was admitted below the cardiology note is the discharge summary Cardiology 12/18/21 Expand All Collapse All    Office Visit    Patient Name:  Trinna Balloon Date of Encounter: 12/18/2021   PCP:  [name], MD              [place]  Cardiologist:  Skeet Latch, MD  Advanced Practice Provider:  No care team member to display Electrophysiologist:  None       Chief Complaint    AMMI HUTT is a 57 y.o. female presents today for follow up after echocardiogram.    Past Medical History        Past Medical History:  Diagnosis Date   Acute bronchitis 08/02/2019   Adrenal adenoma      as per MRI 03/09/14   AKI (acute kidney injury) ([place]) 12/20/2020   Allergy     Anxiety     Coronary artery calcification seen on CAT scan 04/12/2018   Depression     Diabetes mellitus     Diverticulosis     Elevated LFTs      elevated since 09/2013   Fatty liver      moderate- as per 03/06/17 MRI liver   GERD (gastroesophageal reflux disease)     Gout     H/O hiatal hernia     Headache(784.0)     History of depression     Hyperlipemia     Hyperplastic colon polyp     Hypertension     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 Posey Pronto 02/27/2014   Tobacco abuse     Tubular adenoma of colon           Past Surgical History:  Procedure Laterality Date   ANAL FISTULECTOMY   01/19/1985   BREAST EXCISIONAL BIOPSY Left     BREAST LUMPECTOMY Left 1988   PAROTIDECTOMY   03/30/2011    Procedure: PAROTIDECTOMY;  Surgeon: Izora Gala, MD;  Location: [place];  Service: ENT;  Laterality: Right;   WRIST GANGLION EXCISION Left 1988      Allergies        Allergies  Allergen Reactions   Almond Meal Anaphylaxis and Swelling   Other Anaphylaxis      Almond  Butter   Aspirin Nausea Only    History of Present Illness    MALAYZIA LAFORTE is a 57 y.o. female with a hx of mild CAD, HTN, HLD, fatty liver disease, elevated LFTs, tobacco use last seen 11/13/21.   Seen by Dr. Coralyn Mark and referred for CT for unintentional weight loss, tobacco use. CT noted mild to moderate calcified plaque in thoracic aorta and LAD coronary artery calcification. She was referred to cardiology. Coronary CTA 03/29/19 with mild plaque in LAD and minimal plaque in LCx. Atorvastatin previously increased however had elevated LFTs which normalized and she was increased ot '40mg'$ .    HCTZ previously transitioned to Amlodipine due to hypokalemia. However, given elevated BP Valsartan-HCTZ initiated and Amlodipine reduced with improvement in LE edema. Renal artery dopplers with mild to moderate left renal artery stenosis. Labs negative for hyperaldosteronism.    When last seen 11/13/21 repeat lipid ordered with LDL 109. Updated renal artery doppler 11/2021 with stable 1-69% left renal artery stenosis. Echo ordered due to dyspnea with normal LVEF, no significant valvular abnormalities.    She presents today for follow up independently. She reports feeling a bit  dizzy and unsteady in the morning when she wakes up. Describes as a lightheaded feeling. It is worse after she takes her medications. Taking all of her antihypertensives in the morning. Not checking BP routinely at home.  Notes not much of an appetite but doesn't always eat with her medications.She feels she is losing weight and plans to discuss with PCP at upcoming follow up. She worries about being dehydrated.   CAD / HLD- Stable with no anginal symptoms. No indication for ischemic evaluation.  GDMT Atorvastatin. Heart healthy diet and regular cardiovascular exercise encouraged.  LDL not at goal <70. She is unsure what dose of Atorvastatin she is taking and will contact us. Pending response, could consider increased dose vs addition  of Zetia.    Dyspnea - Echo 12/02/21 normal LVEF, no significant valvular abnormalities. Reassurance provided. Likely etiology deconditioning.    Renal artery stenosis - 11/2021 L renal artery 1-59% stenosis, stable compared to previous. Repeat in 1 year for monitoring.    HTN - BP well controlled. Relatively hypotensive in clinic with morning lightheadedness. Move Amlodipine to evening.. Labs 12/02/21 with creatinine 1.33 recommended to stop HCTZ and start Hydralazine. She is hesitant regarding medication changes as she just picked up refill. Repeat BMP today. Encouraged to increase fluid intake. If creatinine remains elevated, consider reducing vs discontinuing Spironolactone as she requests to remain on Valsartan-HCTZ.    Tobacco use - Smoking cessation encouraged. Recommend utilization of [id number]. Following with care guide. Hesitant about Wellbutrin as mom did not tolerate.        Admit date: 12/19/2021 Discharge date: 12/24/2021  Barriers to discharge: none   Admitted From: Home  Disposition:  Home Discharging physician: [name], MD   Recommendations for Outpatient Follow-up:  Adjust metformin further if needed; repeat A1c   Home Health:  Equipment/Devices:    Discharge Condition: stable CODE STATUS: Full Diet recommendation:  Diet Orders (From admission, onward)        Start     Ordered    12/24/21 0000   Diet general        12/24/21 1228                  Hospital Course:   Hypoglycemia, improved -Suspect due to poor renal clearance of her antidiabetic medication of glimepiride in the setting of acute renal failure -CBGs improved prior to discharge - Glimepiride and actos discontinued at discharge - Continue on metformin -Repeat A1c in approximately 3 months   History of DVT (deep vein thrombosis) -Venous doppler on 07/2021 showed DVT of right popliteal vein -Resumed back on Xarelto at discharge   AKI Metabolic acidosis -Creatinine up to 3.40 from  0.63 baseline -Back to baseline prior to discharge   GERD  -Continue PPI   Moderate to large hiatal hernia -Stable hiatal hernia again appreciated   Hypertension -Continue home regimen   Hyponatremia -Improved and normalized with fluids and diet advancement   Iron deficiency and microcytic Anemia - s/p EGD and colonoscopy on 12/23/2021 -Small Cameron erosions appreciated on EGD   Dysphagia -Underwent EGD on 12/23/2021 and esophageal stenosis dilated   Thyroid Mass on Right  -Thyroid ultrasound performed on 12/23/2021 noting 2 nodules that did not meet criteria for biopsy or dedicated follow-up   Depression and Anxiety -Continue with Escitalopram 10 mg p.o. daily as well as Trazodone 200 mg p.o. nightly   Hypoalbuminemia -Continue diet   Hypophosphatemia -Repleted   Gout, improving  S/p steroid use - continue home regimen  This is essentially a transition of care visit and note that we were unable to reach the patient on her phone to get the visit done when the transition of care call occurred in mid December after discharge.  I tested her phone today and rings through but she does have a new phone it is possible she missed the calls for this appointment.  Patient states today she is having a cough productive of [place] mucus and wheezing and she is still smoking a pack a day of cigarettes.  On arrival blood pressure is 112/78 recheck 117/78  Patient expresses significant anxiety over not being employed and based on this and drinks 2 [place] whiskey shots every night along with the active tobacco use.  Now she has not had further gout flares she is on the allopurinol.  She does need follow-up labs.  She also only eats 1 meal a day.  Her glycemic control has been somewhat up and down with this situation.  She does maintain oral medications for her diabetes and is now on the metformin.  With renal insufficiency she was on glipizide and this created a hypoglycemic event.  Blood  sugars have been better since being discharged.  She does need follow-up labs at this visit.  We have had her on Lexapro 10 mg daily she has seen minimal change in anxiety she was asking for Ativan today. 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 Posey Pronto 02/27/2014   Tobacco abuse    Tubular adenoma of colon     Past Surgical History:  Procedure Laterality Date   ANAL FISTULECTOMY  01/19/1985   BALLOON DILATION N/A 12/23/2021   Procedure: BALLOON DILATION;  Surgeon: Gatha Mayer, MD;  Location: [facility];  Service: Gastroenterology;  Laterality: N/A;   BIOPSY  12/23/2021   Procedure: BIOPSY;  Surgeon: Gatha Mayer, MD;  Location: [facility];  Service: Gastroenterology;;   BREAST EXCISIONAL BIOPSY Left    BREAST LUMPECTOMY Left 1988   COLONOSCOPY WITH PROPOFOL N/A 12/23/2021   Procedure: COLONOSCOPY WITH PROPOFOL;  Surgeon: Gatha Mayer, MD;  Location: [facility];  Service: Gastroenterology;  Laterality: N/A;   ESOPHAGOGASTRODUODENOSCOPY (EGD) WITH PROPOFOL N/A 12/23/2021   Procedure: ESOPHAGOGASTRODUODENOSCOPY (EGD) WITH PROPOFOL;  Surgeon: Gatha Mayer, MD;  Location: [facility];  Service: Gastroenterology;  Laterality: N/A;    PAROTIDECTOMY  03/30/2011   Procedure: PAROTIDECTOMY;  Surgeon: Izora Gala, MD;  Location: [facility];  Service: ENT;  Laterality: Right;   WRIST GANGLION EXCISION Left 1988    Family History  Problem Relation Age of Onset   Coronary artery disease Father 84       CAD,CABG   Hypertension Mother    Stroke Mother    Diabetes Sister        uncontrolled   Cancer Sister 15       uterine cancer   Aneurysm Sister        brain   Heart attack Maternal Grandmother    Blindness Paternal Grandmother    Breast cancer Neg Hx    Colon cancer Neg Hx    Esophageal cancer Neg Hx    Rectal cancer Neg Hx    Stomach cancer Neg Hx     Social History   Socioeconomic History   Marital status: Widowed    Spouse name: Not on file   Number of children: 2   Years of education: Not on file   Highest education level: Not on file  Occupational History    Employer: [employer]  Tobacco Use   Smoking status: Every Day    Packs/day: 1.00    Years: 28.00    Total pack years: 28.00    Types: Cigarettes   Smokeless tobacco: Never  Vaping Use   Vaping Use: Never used  Substance and Sexual Activity   Alcohol use: Yes    Alcohol/week: 14.0 standard drinks of alcohol    Types: 14 Standard drinks or equivalent per week   Drug use: No   Sexual activity: Not Currently    Birth control/protection: Post-menopausal  Other Topics Concern   Not on file  Social History Narrative   Married. 2 children.    Ages 76, 18 y/o ( entered 09/2012)      Works with [employer]   Social Determinants of Health   Financial Resource Strain: Not on file  Food Insecurity: No Food Insecurity (12/20/2021)   Hunger Vital Sign    Worried About Running Out of Food in the Last Year: Never true    Ran Out of Food in the Last Year: Never true  Transportation Needs: No Transportation Needs (12/20/2021)   PRAPARE - Hydrologist (Medical): No     Lack of Transportation (Non-Medical): No  Physical Activity: Not on file  Stress: Not on file  Social Connections: Not on file  Intimate Partner Violence: Not At Risk (12/20/2021)   Humiliation, Afraid, Rape, and Kick questionnaire    Fear of Current or Ex-Partner: No    Emotionally Abused: No    Physically Abused: No    Sexually Abused: No    Outpatient Medications Prior to Visit  Medication Sig Dispense Refill   colchicine 0.6 MG tablet Take 1 tablet (0.6 mg total) by mouth 2 (two) times daily as needed (gout flare). 30 tablet 1   EPINEPHrine 0.3 mg/0.3 mL IJ SOAJ injection Insert into muscle once with any facial or neck swelling  Instruct in use 1 Device 1   ferrous sulfate 325 (65 FE) MG EC tablet Take 1 tablet (325 mg total) by mouth daily with breakfast. 60 tablet 3   folic acid (FOLVITE) 1 MG tablet Take 1 tablet (1 mg total) by mouth daily. 60 tablet 2   glucose blood (FREESTYLE PRECISION NEO TEST) test strip Use as instructed 100 each 12   Lancets (FREESTYLE) lancets Use as instructed 100 each 12   Multiple Vitamin (MULTIVITAMIN WITH MINERALS) TABS tablet Take 1 tablet by mouth daily.     albuterol (VENTOLIN HFA) 108 (  90 Base) MCG/ACT inhaler INHALE 2 PUFFS INTO THE LUNGS EVERY 4 HOURS AS NEEDED FOR WHEEZING OR SHORTNESS OF BREATH. 6.7 each 1   allopurinol (ZYLOPRIM) 300 MG tablet Take 1 tablet (300 mg total) by mouth daily. 30 tablet 4   amLODipine (NORVASC) 10 MG tablet Take 10 mg by mouth daily.     escitalopram (LEXAPRO) 10 MG tablet Take 1 tablet (10 mg total) by mouth daily. 90 tablet 2   metFORMIN (GLUCOPHAGE-XR) 500 MG 24 hr tablet Take 2 tablets (1,000 mg total) by mouth daily with supper. 120 tablet 3   pantoprazole (PROTONIX) 40 MG tablet Take 1 tablet (40 mg total) by mouth daily. 30 tablet 4   traZODone (DESYREL) 100 MG tablet TAKE 1 TO 2 TABLETS BY MOUTH AS NEEDED FOR SLEEP (Patient taking differently: Take 200 mg by mouth at bedtime.) 180 tablet 0    valsartan-hydrochlorothiazide (DIOVAN-HCT) 320-12.5 MG tablet Take 1 tablet by mouth daily. 90 tablet 3   XARELTO 20 MG TABS tablet Take 20 mg by mouth every evening.     spironolactone (ALDACTONE) 25 MG tablet Take 1 tablet (25 mg total) by mouth daily. 90 tablet 3   No facility-administered medications prior to visit.    Allergies  Allergen Reactions   Almond Meal Anaphylaxis and Swelling   Other Anaphylaxis    Almond Butter   Aspirin Nausea Only    Other Reaction(s): Not available, Unknown    ROS Review of Systems  Constitutional:  Positive for appetite change. Negative for fatigue.  HENT:  Positive for postnasal drip and rhinorrhea. Negative for ear pain, sinus pressure, sore throat, trouble swallowing and voice change.   Eyes: Negative.   Respiratory:  Positive for cough, shortness of breath and wheezing. Negative for apnea, choking, chest tightness and stridor.   Cardiovascular: Negative.  Negative for chest pain, palpitations and leg swelling.  Gastrointestinal: Negative.  Negative for abdominal distention, abdominal pain, nausea and vomiting.  Endocrine: Negative.   Genitourinary: Negative.  Negative for dysuria and hematuria.  Musculoskeletal:  Negative for arthralgias, joint swelling and myalgias.  Skin: Negative.  Negative for rash.  Allergic/Immunologic: Negative.  Negative for environmental allergies and food allergies.  Neurological:  Negative for dizziness, syncope, weakness and headaches.  Hematological: Negative.  Negative for adenopathy. Does not bruise/bleed easily.  Psychiatric/Behavioral:  Positive for dysphoric mood and sleep disturbance. Negative for agitation and suicidal ideas. The patient is nervous/anxious.       Objective:    Physical Exam Vitals reviewed.  Constitutional:      Appearance: Normal appearance. She is well-developed and normal weight. She is not diaphoretic.  HENT:     Head: Normocephalic and atraumatic.     Nose: Congestion and  rhinorrhea present. No nasal deformity, septal deviation or mucosal edema.     Right Sinus: No maxillary sinus tenderness or frontal sinus tenderness.     Left Sinus: No maxillary sinus tenderness or frontal sinus tenderness.     Comments: Bilateral purulence in naris    Mouth/Throat:     Mouth: Mucous membranes are moist.     Pharynx: Oropharynx is clear. No oropharyngeal exudate.  Eyes:     General: No scleral icterus.    Conjunctiva/sclera: Conjunctivae normal.     Pupils: Pupils are equal, round, and reactive to light.  Neck:     Thyroid: No thyromegaly.     Vascular: No carotid bruit or JVD.     Trachea: Trachea normal. No tracheal tenderness or tracheal  deviation.  Cardiovascular:     Rate and Rhythm: Normal rate and regular rhythm.     Chest Wall: PMI is not displaced.     Pulses: Normal pulses. No decreased pulses.     Heart sounds: Normal heart sounds, S1 normal and S2 normal. Heart sounds not distant. No murmur heard.    No systolic murmur is present.     No diastolic murmur is present.     No friction rub. No gallop. No S3 or S4 sounds.  Pulmonary:     Effort: Pulmonary effort is normal. No tachypnea, accessory muscle usage or respiratory distress.     Breath sounds: No stridor. Wheezing present. No decreased breath sounds, rhonchi or rales.  Chest:     Chest wall: No tenderness.  Abdominal:     General: Abdomen is flat. Bowel sounds are normal. There is no distension.     Palpations: Abdomen is soft. Abdomen is not rigid. There is no mass.     Tenderness: There is no abdominal tenderness. There is no guarding or rebound.  Musculoskeletal:        General: No tenderness. Normal range of motion.     Cervical back: Normal range of motion and neck supple. No edema, erythema or rigidity. No muscular tenderness. Normal range of motion.     Right lower leg: No edema.     Left lower leg: No edema.     Right foot: Tenderness: tophi present on metatarsal of first toe.     Left  foot: Normal.     Comments: No pain or edema or tenderness in the right leg There is bruising behind the posterior aspect of the left knee  Lymphadenopathy:     Head:     Right side of head: No submental or submandibular adenopathy.     Left side of head: No submental or submandibular adenopathy.     Cervical: No cervical adenopathy.  Skin:    General: Skin is warm and dry.     Coloration: Skin is not pale.     Findings: No rash.     Nails: There is no clubbing.  Neurological:     Mental Status: She is alert and oriented to person, place, and time.     Sensory: No sensory deficit.  Psychiatric:        Attention and Perception: Attention and perception normal.        Mood and Affect: Affect normal. Mood is anxious.        Speech: Speech normal.        Behavior: Behavior normal.        Thought Content: Thought content normal.        Cognition and Memory: Cognition and memory normal.        Judgment: Judgment normal.     BP 117/78   Pulse (!) 102   Ht '5\' 3"'$  (1.6 m)   Wt 131 lb 9.6 oz (59.7 kg)   SpO2 98%   BMI 23.31 kg/m  Wt Readings from Last 3 Encounters:  02/26/22 131 lb 9.6 oz (59.7 kg)  12/23/21 130 lb 4.7 oz (59.1 kg)  12/18/21 132 lb (59.9 kg)     Health Maintenance Due  Topic Date Due   DTaP/Tdap/Td (1 - Tdap) Never done   Zoster Vaccines- Shingrix (1 of 2) Never done   OPHTHALMOLOGY EXAM  06/10/2017   Lung Cancer Screening  03/28/2020   INFLUENZA VACCINE  08/19/2021   COVID-19 Vaccine (3 - 2023-24  season) 09/19/2021   FOOT EXAM  02/04/2022    There are no preventive care reminders to display for this patient. Vas US Renal Artery Bilateral 12/19/20  Right: Normal size right kidney. Normal right Resisitive Index. No         evidence of right renal artery stenosis. RRV flow present.  Left:  Normal size of left kidney. Normal left Resistive Index.         1-59% stenosis of the left renal artery. LRV flow present.  Mesenteric:  Normal Celiac artery  findings.      Cor CT 03/29/19   IMPRESSION: LAD is a large vessel with mild (25-49%) calcified plaque in the proximal portion. The mid and distal portion with no plaque. D1 small vessel with no plaque. LCX is a non-dominant artery that gives rise to one large OM1 branch. There is minimal (1-24%) calcified plaque in the proximal portion of the LCX. The mid and distal portion with no plaque. There is no plaque in the OM1 branch. 1. Coronary calcium score of 50.4. This was 50 percentile for age and sex matched control. 2. Normal coronary origin with right dominance. 3. Mild Coronary artery disease. CADRADS 2. Recommend medical therapy. 4. Mild calcification of the aortic root.  Venous Dopplers February 24 reviewed showed DVT common femoral vein right leg and some clot up in the right iliac vein Lab Results  Component Value Date   TSH 0.808 12/21/2021   Lab Results  Component Value Date   WBC 7.5 12/24/2021   HGB 8.5 (L) 12/24/2021   HCT 28.0 (L) 12/24/2021   MCV 75.5 (L) 12/24/2021   PLT 365 12/24/2021   Lab Results  Component Value Date   NA 140 12/24/2021   K 3.5 12/24/2021   CO2 21 (L) 12/24/2021   GLUCOSE 100 (H) 12/24/2021   BUN 14 12/24/2021   CREATININE 0.65 12/24/2021   BILITOT 0.3 12/24/2021   ALKPHOS 50 12/24/2021   AST 22 12/24/2021   ALT 24 12/24/2021   PROT 6.6 12/24/2021   ALBUMIN 3.0 (L) 12/24/2021   CALCIUM 9.1 12/24/2021   ANIONGAP 8 12/24/2021   EGFR 17 (L) 12/18/2021   GFR 95.71 06/06/2019   Lab Results  Component Value Date   CHOL 204 (H) 12/02/2021   Lab Results  Component Value Date   HDL 81 12/02/2021   Lab Results  Component Value Date   LDLCALC 109 (H) 12/02/2021   Lab Results  Component Value Date   TRIG 78 12/02/2021   Lab Results  Component Value Date   CHOLHDL 2.5 12/02/2021   Lab Results  Component Value Date   HGBA1C 5.9 (H) 12/23/2021      Assessment & Plan:   Problem List Items Addressed This Visit        Cardiovascular and Mediastinum   Hypertension - Primary    Blood pressure at goal however concerned about use of hydrochlorothiazide she is on the Aldactone  Plan is to change valsartan HCT to plain valsartan and reassess renal function      Relevant Medications   amLODipine (NORVASC) 10 MG tablet   spironolactone (ALDACTONE) 25 MG tablet   valsartan (DIOVAN) 320 MG tablet   XARELTO 20 MG TABS tablet   Renal artery stenosis (HCC)    Status post dilation      Relevant Medications   amLODipine (NORVASC) 10 MG tablet   spironolactone (ALDACTONE) 25 MG tablet   valsartan (DIOVAN) 320 MG tablet   XARELTO 20 MG  TABS tablet   Other Relevant Orders   Comprehensive metabolic panel   Acute deep vein thrombosis (DVT) of femoral vein of right lower extremity (HCC)    Patient's been on Xarelto and will maintain      Relevant Medications   amLODipine (NORVASC) 10 MG tablet   spironolactone (ALDACTONE) 25 MG tablet   valsartan (DIOVAN) 320 MG tablet   XARELTO 20 MG TABS tablet   Coronary artery disease of native artery of native heart with stable angina pectoris ([place])    Continue with preventive therapies and as per cardiology      Relevant Medications   amLODipine (NORVASC) 10 MG tablet   escitalopram (LEXAPRO) 20 MG tablet   spironolactone (ALDACTONE) 25 MG tablet   traZODone (DESYREL) 100 MG tablet   valsartan (DIOVAN) 320 MG tablet   XARELTO 20 MG TABS tablet     Digestive   GERD (gastroesophageal reflux disease)    Continue with PPI therapy      Relevant Medications   pantoprazole (PROTONIX) 40 MG tablet   Fatty liver   Relevant Orders   CBC with Differential/Platelet   Esophageal dysphagia    Status post dilation with improved swallowing        Endocrine   Type 2 diabetes mellitus with other specified complication (HCC)    Blood sugars stable continue metformin renal function allows metformin use      Relevant Medications   metFORMIN (GLUCOPHAGE-XR) 500 MG 24  hr tablet   valsartan (DIOVAN) 320 MG tablet   Other Relevant Orders   Comprehensive metabolic panel   Hemoglobin A1c   RESOLVED: Hypoglycemia    Resolved off glipizide      Relevant Orders   Comprehensive metabolic panel     Musculoskeletal and Integument   Synovitis of left knee    Resolved we will monitor      Relevant Medications   allopurinol (ZYLOPRIM) 300 MG tablet   Polyarthritis with positive rheumatoid factor (HCC)    Currently arthritis stable we will monitor        Other   Hyperlipemia    Continue current and statin therapies      Relevant Medications   amLODipine (NORVASC) 10 MG tablet   spironolactone (ALDACTONE) 25 MG tablet   valsartan (DIOVAN) 320 MG tablet   XARELTO 20 MG TABS tablet   Other Relevant Orders   Lipid panel   Tobacco use    Discussed smoking cessation. Rx sent for nicotine patches and lozenges. Referral to [place], LCSW for behavioral therapy.      Current smoking consumption amount: 1 ppd  Dicsussion on advise to quit smoking and smoking impacts: CV lung impacts  Patient's willingness to quit:  Interested   Methods to quit smoking discussed:  Behavioral mod /meds  Medication management of smoking session drugs discussed:nicotine replacement: combo of nicotine patch and lozenge  Resources provided:  AVS   Setting quit date not established  Follow-up arranged 2 mo   Time spent counseling the patient:  5 min       EtOH dependence ([place])    Still drinking 2 whiskey shots daily advised reduction in alcohol use and we will get her connected with clinical social work      Moderate episode of recurrent major depressive disorder ([place])    Will increase Lexapro to 20 mg daily      Relevant Medications   escitalopram (LEXAPRO) 20 MG tablet   traZODone (DESYREL) 100 MG tablet   Encounter  for screening for lung cancer    I had a shared decision-making conversation with the patient due to her smoking history I recommend lung  cancer screening with low-dose CT she agrees      Relevant Orders   CT CHEST LUNG CA SCREEN LOW DOSE W/O CM   Meds ordered this encounter  Medications   cefdinir (OMNICEF) 300 MG capsule    Sig: Take 1 capsule (300 mg total) by mouth 2 (two) times daily.    Dispense:  14 capsule    Refill:  0   fluticasone (FLONASE) 50 MCG/ACT nasal spray    Sig: Place 2 sprays into both nostrils daily.    Dispense:  16 g    Refill:  6   albuterol (VENTOLIN HFA) 108 (90 Base) MCG/ACT inhaler    Sig: Inhale 2 puffs into the lungs every 4 (four) hours as needed for wheezing or shortness of breath.    Dispense:  6.7 each    Refill:  1   allopurinol (ZYLOPRIM) 300 MG tablet    Sig: Take 1 tablet (300 mg total) by mouth daily.    Dispense:  30 tablet    Refill:  4   amLODipine (NORVASC) 10 MG tablet    Sig: Take 1 tablet (10 mg total) by mouth daily.    Dispense:  90 tablet    Refill:  2   escitalopram (LEXAPRO) 20 MG tablet    Sig: Take 1 tablet (20 mg total) by mouth daily.    Dispense:  60 tablet    Refill:  2   metFORMIN (GLUCOPHAGE-XR) 500 MG 24 hr tablet    Sig: Take 2 tablets (1,000 mg total) by mouth daily with supper.    Dispense:  120 tablet    Refill:  3   pantoprazole (PROTONIX) 40 MG tablet    Sig: Take 1 tablet (40 mg total) by mouth daily.    Dispense:  30 tablet    Refill:  4   spironolactone (ALDACTONE) 25 MG tablet    Sig: Take 1 tablet (25 mg total) by mouth daily.    Dispense:  90 tablet    Refill:  3   traZODone (DESYREL) 100 MG tablet    Sig: Take 2 tablets (200 mg total) by mouth at bedtime.    Dispense:  60 tablet    Refill:  1   valsartan (DIOVAN) 320 MG tablet    Sig: Take 1 tablet (320 mg total) by mouth daily.    Dispense:  90 tablet    Refill:  3   XARELTO 20 MG TABS tablet    Sig: Take 1 tablet (20 mg total) by mouth every evening.    Dispense:  30 tablet    Refill:  2    future  38 minutes spent complex decision making multisystems assessed review of  multiple associate consultant notes education to patient Follow-up: Return in about 6 weeks (around 04/09/2022) for htn, diabetes, renal failure, anxiety, hyperlipidemia.    Asencion Noble, MD

## 2022-02-26 NOTE — Patient Instructions (Signed)
Change valsartan HCT to plain valsartan 1 daily Start cefdinir twice daily for sinus infection You may get over-the-counter Delsym to take 5 cc 3 times daily as needed for cough Start fluticasone 2 sprays each nostril twice daily for sinus Get some type of saline rinse 3 sprays each nostril 2-3 times a day for sinus congestion Low-dose lung cancer screening test will be obtained Reduce tobacco intake use nicotine lozenges 4 mg 3 times a day Complete screening labs obtained at this visit We will hold off given you Ativan for now Try to reduce alcohol intake in half Use Glucerna if you skip meals Referral to our clinical social worker is made she will call you Flu vaccine was given Refills on all medications given  Return to Dr. Joya Gaskins 6 weeks

## 2022-02-26 NOTE — Assessment & Plan Note (Signed)
Resolved off glipizide

## 2022-02-26 NOTE — Assessment & Plan Note (Signed)
I had a shared decision-making conversation with the patient due to her smoking history I recommend lung cancer screening with low-dose CT she agrees

## 2022-02-26 NOTE — Assessment & Plan Note (Signed)
Status post dilation

## 2022-02-26 NOTE — Assessment & Plan Note (Signed)
Resolved we will monitor

## 2022-02-26 NOTE — Assessment & Plan Note (Signed)
Still drinking 2 whiskey shots daily advised reduction in alcohol use and we will get her connected with clinical social work

## 2022-02-26 NOTE — Assessment & Plan Note (Signed)
Continue current and statin therapies

## 2022-02-26 NOTE — Assessment & Plan Note (Signed)
Patient's been on Xarelto and will maintain

## 2022-02-26 NOTE — Assessment & Plan Note (Signed)
Continue with PPI therapy

## 2022-02-26 NOTE — Telephone Encounter (Signed)
Pls see patient for severe anxiety and smoking dependence

## 2022-02-26 NOTE — Assessment & Plan Note (Signed)
Continue with preventive therapies and as per cardiology

## 2022-02-27 ENCOUNTER — Telehealth: Payer: Self-pay

## 2022-02-27 ENCOUNTER — Other Ambulatory Visit: Payer: Self-pay | Admitting: Critical Care Medicine

## 2022-02-27 DIAGNOSIS — E7800 Pure hypercholesterolemia, unspecified: Secondary | ICD-10-CM | POA: Insufficient documentation

## 2022-02-27 LAB — LIPID PANEL
Chol/HDL Ratio: 3.4 ratio (ref 0.0–4.4)
Cholesterol, Total: 245 mg/dL — ABNORMAL HIGH (ref 100–199)
HDL: 72 mg/dL (ref >39)
LDL Chol Calc (NIH): 142 mg/dL — ABNORMAL HIGH (ref 0–99)
Triglycerides: 174 mg/dL — ABNORMAL HIGH (ref 0–149)
VLDL Cholesterol Cal: 31 mg/dL (ref 5–40)

## 2022-02-27 LAB — COMPREHENSIVE METABOLIC PANEL
ALT: 19 IU/L (ref 0–32)
AST: 18 IU/L (ref 0–40)
Albumin/Globulin Ratio: 1.4 (ref 1.2–2.2)
Albumin: 4.4 g/dL (ref 3.8–4.9)
Alkaline Phosphatase: 72 IU/L (ref 44–121)
BUN/Creatinine Ratio: 11 (ref 9–23)
BUN: 17 mg/dL (ref 6–24)
Bilirubin Total: 0.3 mg/dL (ref 0.0–1.2)
CO2: 22 mmol/L (ref 20–29)
Calcium: 10.5 mg/dL — ABNORMAL HIGH (ref 8.7–10.2)
Chloride: 93 mmol/L — ABNORMAL LOW (ref 96–106)
Creatinine, Ser: 1.51 mg/dL — ABNORMAL HIGH (ref 0.57–1.00)
Globulin, Total: 3.2 g/dL (ref 1.5–4.5)
Glucose: 117 mg/dL — ABNORMAL HIGH (ref 70–99)
Potassium: 4.6 mmol/L (ref 3.5–5.2)
Sodium: 133 mmol/L — ABNORMAL LOW (ref 134–144)
Total Protein: 7.6 g/dL (ref 6.0–8.5)
eGFR: 40 mL/min/{1.73_m2} — ABNORMAL LOW (ref >59)

## 2022-02-27 LAB — CBC WITH DIFFERENTIAL/PLATELET
Basophils Absolute: 0.1 10*3/uL (ref 0.0–0.2)
Basos: 1 %
EOS (ABSOLUTE): 0.2 10*3/uL (ref 0.0–0.4)
Eos: 2 %
Hematocrit: 36.5 % (ref 34.0–46.6)
Hemoglobin: 12.3 g/dL (ref 11.1–15.9)
Immature Grans (Abs): 0 10*3/uL (ref 0.0–0.1)
Immature Granulocytes: 0 %
Lymphocytes Absolute: 1.6 10*3/uL (ref 0.7–3.1)
Lymphs: 17 %
MCH: 26.3 pg — ABNORMAL LOW (ref 26.6–33.0)
MCHC: 33.7 g/dL (ref 31.5–35.7)
MCV: 78 fL — ABNORMAL LOW (ref 79–97)
Monocytes Absolute: 0.7 10*3/uL (ref 0.1–0.9)
Monocytes: 7 %
Neutrophils Absolute: 7.2 10*3/uL — ABNORMAL HIGH (ref 1.4–7.0)
Neutrophils: 73 %
Platelets: 387 10*3/uL (ref 150–450)
RBC: 4.68 x10E6/uL (ref 3.77–5.28)
RDW: 20.2 % — ABNORMAL HIGH (ref 11.7–15.4)
WBC: 9.9 10*3/uL (ref 3.4–10.8)

## 2022-02-27 LAB — HEMOGLOBIN A1C
Est. average glucose Bld gHb Est-mCnc: 140 mg/dL
Hgb A1c MFr Bld: 6.5 % — ABNORMAL HIGH (ref 4.8–5.6)

## 2022-02-27 MED ORDER — ROSUVASTATIN CALCIUM 40 MG PO TABS: 40.0000 mg | ORAL_TABLET | Freq: Every day | ORAL | 3 refills | Status: DC

## 2022-02-27 NOTE — Progress Notes (Signed)
Let pt know kidney normal liver normal, blood count now normal she is no longer anemic, cholesterol remains high, diabetes at goal A1c satisfactory I ordered a cholesterol pill take one daily

## 2022-02-27 NOTE — Telephone Encounter (Signed)
Pt was called and vm was left, Information has been sent to nurse pool.   

## 2022-02-27 NOTE — Telephone Encounter (Signed)
-----   Message from [name], MD sent at 02/27/2022  6:08 AM EST ----- Let pt know kidney normal liver normal, blood count now normal she is no longer anemic, cholesterol remains high, diabetes at goal A1c satisfactory I ordered a cholesterol pill take one daily

## 2022-03-22 ENCOUNTER — Other Ambulatory Visit: Payer: Self-pay | Admitting: Critical Care Medicine

## 2022-03-25 ENCOUNTER — Encounter: Payer: Self-pay | Admitting: Critical Care Medicine

## 2022-03-26 NOTE — Telephone Encounter (Signed)
LCSWA called patient today to introduce herself and to assess patients' mental health needs. Patient did not answer the phone. LCSWA was NOT able to leave a brief message with the patient asking them to return the call. Patient was referred by PCP for anxiety

## 2022-03-27 ENCOUNTER — Inpatient Hospital Stay: Admission: RE | Admit: 2022-03-27 | Payer: Medicaid Other | Source: Ambulatory Visit

## 2022-04-09 ENCOUNTER — Encounter: Payer: Self-pay | Admitting: Critical Care Medicine

## 2022-04-09 ENCOUNTER — Ambulatory Visit: Payer: Medicaid Other | Attending: Critical Care Medicine | Admitting: Critical Care Medicine

## 2022-04-09 VITALS — BP 128/75 | HR 108 | Ht 63.0 in | Wt 130.0 lb

## 2022-04-09 DIAGNOSIS — E1169 Type 2 diabetes mellitus with other specified complication: Secondary | ICD-10-CM | POA: Diagnosis not present

## 2022-04-09 DIAGNOSIS — I82411 Acute embolism and thrombosis of right femoral vein: Secondary | ICD-10-CM

## 2022-04-09 DIAGNOSIS — E7800 Pure hypercholesterolemia, unspecified: Secondary | ICD-10-CM

## 2022-04-09 DIAGNOSIS — I25118 Atherosclerotic heart disease of native coronary artery with other forms of angina pectoris: Secondary | ICD-10-CM

## 2022-04-09 DIAGNOSIS — I10 Essential (primary) hypertension: Secondary | ICD-10-CM | POA: Diagnosis not present

## 2022-04-09 DIAGNOSIS — F331 Major depressive disorder, recurrent, moderate: Secondary | ICD-10-CM

## 2022-04-09 DIAGNOSIS — F1029 Alcohol dependence with unspecified alcohol-induced disorder: Secondary | ICD-10-CM

## 2022-04-09 DIAGNOSIS — Z720 Tobacco use: Secondary | ICD-10-CM

## 2022-04-09 DIAGNOSIS — Z122 Encounter for screening for malignant neoplasm of respiratory organs: Secondary | ICD-10-CM

## 2022-04-09 NOTE — Patient Instructions (Signed)
Hold flonase for two weeks then take as needed for congestion, use saline rinse to nostrils No medication changes Reduce smoking and alcohol use Return 4 weeks

## 2022-04-09 NOTE — Assessment & Plan Note (Signed)
Improved on increased dose of Lexapro

## 2022-04-09 NOTE — Assessment & Plan Note (Signed)
Hypertension under excellent control we will continue with amlodipine 10 mg daily and Aldactone 25 mg daily and valsartan 320 mg daily.  Patient will return to advanced hypertension clinic for follow-up

## 2022-04-09 NOTE — Assessment & Plan Note (Signed)
A1c has been at goal continue with metformin as prescribed

## 2022-04-09 NOTE — Assessment & Plan Note (Signed)
Lung cancer screening test pending

## 2022-04-09 NOTE — Assessment & Plan Note (Signed)
Discussed smoking cessation. Rx sent for nicotine patches and lozenges. Referral to [facility], LCSW for behavioral therapy.      . Current smoking consumption amount: 1 ppd  . Dicsussion on advise to quit smoking and smoking impacts: CV lung impacts  . Patient's willingness to quit:  Interested   . Methods to quit smoking discussed:  Behavioral mod /meds  . Medication management of smoking session drugs discussed:nicotine replacement: combo of nicotine patch and lozenge  . Resources provided:  [facility]S   . Setting quit date not established  . Follow-up arranged 2 mo   Time spent counseling the patient:  5 min  

## 2022-04-09 NOTE — Assessment & Plan Note (Signed)
Follow-up per cardiology patient will continue with current medications stay on rosuvastatin 20 mg daily

## 2022-04-09 NOTE — Assessment & Plan Note (Signed)
Counseled on reducing alcohol intake there is no risk for withdrawal

## 2022-04-09 NOTE — Assessment & Plan Note (Signed)
Continue with Xarelto 

## 2022-04-09 NOTE — Progress Notes (Signed)
NEW Patient Office Visit  Subjective:  Patient ID: Stacy [name], female    DOB: 04/12/1965  Age: 57 y.o. MRN: [id number]  CC:  Chief Complaint  Patient presents with   Hypertension    HPI 01/2021  Stacy [name] presents for follow up after hospitalization for an AKI/acute renal failure and electrolyte imbalance that resulted from severe vomiting. She was concurrently treated for an acute gout flare at that time. While in the hospital, she received nephrology and orthopedics consult. Her kidney function improved during her hospital stay, and she was cleared by nephrology for discharge. She received bilateral knee arthrocentesis and steroid injection of her knees to treat her gout. She is in need of an MRI for her knees, and has just gotten insurance approval for this procedure. She is attempting to schedule the MRI. The following includes the summary from discharge:   "57 y.o. female with medical history significant of HTN, HLD, GERD, T2DM, depression with anxiety, gout, nonobstructive CAD, left renal artery stenosis, tobacco abuse treated with abdominal pain, nausea and vomiting and not feeling well along with bilateral knee pain.  On presentation, she was slightly hypotensive, tachycardic.  WBC of 12.4; BUN of 77, creatinine of 9.5, lipase of 62.  Chest x-ray showed no acute findings.  Ultrasound of abdomen showed no acute findings to explain her abdominal pain.  She was started on IV fluids.  Nephrology was consulted.  During the hospitalization, her condition has gradually improved; creatinine is improving.  Orthopedics was consulted and patient underwent bilateral knee arthrocentesis and steroid injection; synovial fluid was consistent with gout flare.  She was also treated with oral prednisone.  Currently her renal function has much improved, nephrology has cleared the patient for discharge.  Currently she is hemodynamically stable, tolerating diet.  She will be discharged home today on  oral prednisone and allopurinol will be started.  Follow-up with PCP and orthopedics.  Follow-up with nephrology if needed."  Today, the patient states that she has improved since her hospital stay. She has had follow ups with multiple providers for her other medical issues. She followed up with cardiology on 01/24/21 and was started on spironolactone. She has a diagnosis of mild nonobstructive CAD that is being medically managed at this point in time. She previously took a statin for this issue, but it was stopped during hospitalization due to elevated liver transaminase. She has been referred to the lipid clinic for further management of this issue. She followed up with endocrinology for her diabetes earlier today. She is currently on Amaryl and Actos, and metformin was added back to her regimen today. She checks her blood sugars in the morning, and reports they have typically been between 105-110 mg/dL.   Stacy [name] gout symptoms remain troublesome. She is having significant pain in her right knee from the gout flare. She is taking Vicodin for this. She is unable to do any weight-bearing on this leg. She also has pain from the gout flare in her right foot. Her other concern today is that she is having significant trouble with sleeping. She has difficulty falling asleep and staying asleep, and this has been significantly impacting her mood. She has experienced racing thoughts when trying to fall asleep, and she has felt down and depressed.  The patient states that she is drinking less alcohol than previously. She now drinks two whiskeys 3-4 times per week, rather than every day. She still smokes, less than 1 PPD. She states she has been  smoking a little more previously due to feelings of stress.  Notes reviewed below :  Cardiology OV 01/24/21.  Hosp DC from early 12/2020  Admit date: 12/19/2020 Discharge date: 12/22/2020   Admitted From: Home Disposition: Home   Recommendations for Outpatient Follow-up:   Follow up with PCP in 1 week with repeat CBC/BMP Outpatient follow-up with nephrology if needed Outpatient follow-up with orthopedics/Dr. Lorin Mercy Follow up in ED if symptoms worsen or new appear     Home Health: No Equipment/Devices: None   Discharge Condition: Stable CODE STATUS: Full Diet recommendation: Heart healthy/carb modified   Brief/Interim Summary: 57 y.o. female with medical history significant of HTN, HLD, GERD, T2DM, depression with anxiety, gout, nonobstructive CAD, left renal artery stenosis, tobacco abuse treated with abdominal pain, nausea and vomiting and not feeling well along with bilateral knee pain.  On presentation, she was slightly hypotensive, tachycardic.  WBC of 12.4; BUN of 77, creatinine of 9.5, lipase of 62.  Chest x-ray showed no acute findings.  Ultrasound of abdomen showed no acute findings to explain her abdominal pain.  She was started on IV fluids.  Nephrology was consulted.  During the hospitalization, her condition has gradually improved; creatinine is improving.  Orthopedics was consulted and patient underwent bilateral knee arthrocentesis and steroid injection; synovial fluid was consistent with gout flare.  She was also treated with oral prednisone.  Currently her renal function has much improved, nephrology has cleared the patient for discharge.  Currently she is hemodynamically stable, tolerating diet.  She will be discharged home today on oral prednisone and allopurinol will be started.  Follow-up with PCP and orthopedics.  Follow-up with nephrology if needed.   Discharge Diagnoses:    Acute kidney injury/acute renal failure Anion gap metabolic acidosis -Possibly from ATN from dehydration and hypotension -Presented with creatinine of 9.5; treated with IV fluids.  Creatinine much improved to 1.57 today.  - Renal ultrasound negative for hydronephrosis  -nephrology following and has cleared the patient for discharge.  Outpatient follow-up with  nephrology if needed. -Outpatient follow-up of BMP by PCP   Possible UTI -Present on admission.  Treated with 3 days of Rocephin.  No need for any further antibiotics on discharge.  Currently afebrile.   Bilateral knee pain and swelling/?  Acute gout flare -Patient states that she has had similar symptoms in the past requiring arthrocentesis which was consistent with acute gout.   - Avoid colchicine because of recent nausea and vomiting -Status post bilateral arthrocentesis by orthopedics on 12/20/2020: Synovial fluid analysis consistent with acute gout.  Bilateral knees were injected with steroids per orthopedics on 12/20/2020. -Continue oral prednisone on discharge 40 mg daily for 7 days.  Outpatient follow-up with orthopedics.     Nausea/vomiting -Questionable cause.  Ultrasound abdomen was negative for acute cause/gallbladder disease. -Much improved.  Currently tolerating diet.   Right upper quadrant pain/shoulder pain -Might be musculoskeletal as well.  Lipase only mildly elevated.  Troponin negative.  Continue current pain management.   Hypomagnesemia -Resolved   Mild hyperkalemia -Resolved   Leukocytosis -Possibly reactive.  Resolved   Renal artery stenosis -Renal duplex showed 1 to 59% stenosis of left renal 3.;  No evidence of right renal artery stenosis.   Normocytic anemia -Questionable cause.  Hemoglobin stable.  Outpatient follow-up   Fatty liver  Mildly elevated AST -Possibly from alcohol use.  Outpatient follow-up with GI.  Abstain from alcohol   Hypertension -Blood pressure on the lower side.  Antihypertensives on hold  Hyperlipidemia -Statin on hold because of slightly elevated LFTs.  Outpatient follow-up with PCP.   GERD -Has completed Protonix treatment as an outpatient.  Follow-up with PCP.   Depression with anxiety -Stable.  Continue Remeron   Adrenal mass, right -Has outpatient follow-up   Tobacco use -Continue nicotine patch.  Patient was  counseled regarding cessation by admitting hospitalist  Cards 1/6    Essential hypertension: Medications were held during hospitalization 12/22 due to acute renal failure and hypotension. She was restarted on clonidine 0.2 mg 3 times a day.  Initial clinic blood pressure is elevated, however on my recheck it has actually decreased to 100/64.  She reports high blood pressure readings at home.  She would like to take as few pills as possible and would like something that may help with lower extremity edema.  We will restart spironolactone 25 mg once daily and reduce clonidine to 0.2 mg twice daily.  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  We will plan to follow-up on her blood pressure in 3 to 6 months depending on her availability.  She reports she currently is missing a lot of work due to multiple appointments and recent hospitalization.   Mild nonobstructive CAD: Coronary CT March 2021 revealed mild disease in the LAD and LCx.  Her coronary calcium score is 50.4, 94th percentile for age/sex, mild calcification of the aortic root.  Medical therapy recommended.  She denies chest pain, dyspnea or other symptoms concerning for angina.  No further ischemic evaluation warranted at this time.  Her statin was stopped during hospitalization 12/22 due to elevated transaminase.  This was also a problem in the past for her.  We will refer her to lipid clinic for management of lipids.  Aspirin was stopped during hospitalization due to anemia.  Would favor restarting aspirin 81 mg at next visit if blood counts have stabilized.   Hyperlipidemia LDL goal < 70: LDL 75 6/22.  She was previously on atorvastatin 40 mg.  As noted above statin was stopped during recent hospitalization due to elevated transaminase.  We will refer her to lipid clinic for management.   Bilateral leg edema: She has 1+ pitting edema in both lower extremities.  She is  having a significant amount of right knee swelling and pain due to gout.  She requests a blood pressure medicine that may help reduce some of the swelling.  We will restart spironolactone 25 mg as noted above she wants to take as few pills as possible and this is a potassium sparing diuretic.  Advised that she may not notice significant improvement in joint swelling with spironolactone.  Encouraged leg elevation (toes to nose) to help decrease leg swelling.   Difficulty sleeping: She states she is having difficulty sleeping because her mind is racing and she is having knee pain and requests a prescription for Ambien. States melatonin does not work for her. I encouraged her to use nonpharmacologic methods to help her sleep and suggested the Calm app or other methods that may help her calm her mind before going to sleep.  Encouraged leg elevation to help with pain and swelling.   Tobacco abuse: She continues to smoke less than 1/2 packs/day. Has reduced daytime smoking. Feels that her son who has recently moved back to [place] may help her quit because he is a big encouragement. Complete cessation advised.   04/07/21 Patient seen in return  follow-up.  Patient has hypertension coronary artery calcification diabetes on oral therapy.  On arrival blood pressure 134/90 blood sugar 152.  Patient's had low blood sugars at times.  She is also had low blood pressures on clonidine therefore this drug was stopped.  She does maintain low-dose Aldactone.  She had increased pain and swelling in the right thigh and orthopedics appropriately ordered ultrasound of the lower extremities finding DVT in the right iliac vein and this was on February 24 of this year..  She has now been on the Xarelto.  She is completing the starter pack.  She has had no bleeding problems on Xarelto.  The leg pain is improved.  She still smoking less than 1/2 pack a day of cigarettes.  She was drinking 2 Crown Royal's a day but has reduced her  intake.  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  She lost her job because of all her health issues she was not able to attend regularly at work.  Patient did receive a Pap smear and it was normal.  She follows with endocrinology.  She is also been counseling with our licensed clinical social worker with her depression.  She continues to have insomnia and is requesting a higher dose of the trazodone.  Appetite is poor.  She would like some health screening labs obtained at this visit.  06/12/21 This patient is seen in return for follow-up is a 57 year old female with type 2 diabetes ethanol use tobacco use hypertension.  On arrival blood pressure is elevated 150/80 and she is having difficulty maintaining her diabetic program.  She is losing significant amounts of weight and has had a chronic cough.  We gave her a course of azithromycin a week ago and it persists.  She has some postnasal drainage and significant allergy symptoms.  She is drinking 3 [place] whiskeys daily.  She struggled to get off the alcohol.  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  She has been on Xarelto therapy now for 2 months.  Patient's had no bleeding with Xarelto  Patient continues to smoke a pack a day of cigarettes  Patient does not follow a healthy diet  Patient follows with endocrinology and they have her on oral Actos Amaryl and metformin.  She is often holding these medications.  She takes the medication she has severe nausea.  She is only eating 1 meal a day and cannot take in much food.  Blood pressure medicines now include the clonidine 0.2 mg twice daily and spironolactone 25 mg daily  She measures her blood pressure at home she gets around 140/90 usually.  7/27 Patient returns in follow-up and had been out of  medications for 2 weeks over the past week she was caring for her father.  She has been back on her medicines now for about 2 days and on arrival blood pressure elevated 167/103  Patient is no longer taking clonidine because of hypotension but she does maintain Aldactone 25 mg daily and valsartan 320 mg daily.  Patient did see our clinical pharmacist Lurena Joiner on June 30 is as documented below.  Saw Lurena Joiner 6/30 Stacy [name] is a 57 y.o. female who presents for hypertension evaluation, education, and management.  PMH is significant for HTN, CAD, unilateral (L) renal artery stenosis, migraines,  VTE on anticoagulation with Xarelto, GERD, T2DM, bilateral knee synovitis, HLD, GOUT, MDD, tobacco use and alcohol use.    Patient was referred and last seen by Primary Care Provider, Dr. Joya Gaskins, on 06/12/2021.    At last visit, Dr. Joya Gaskins added back valsartan to her regimen.    Recent BP management hx:  Last seen by Dr. Oval Linsey in 2022. Was controlled on amlodipine, HCTZ, valsartan, and spiro. Was hospitalized 12/1 - 12/22/2020 with AKI and her antihypertensives were held. She was restarted on clonidine 0.2 mg TID at that time. NP at [facility] restarted spiro and decreased clonidine on 01/24/21. Of note, pt found to have 1+ pitting edema at that visit. Clonidine was reduced to 0.1 mg BID d/t hypotension by Dr. [name] on 02/04/21. We have seen her a couple of times since with BP being at or close to goal on 1/17 and 04/07/2021. Most recently, pt was seen 06/12/2021 and BP was above goal. Valsartan was restarted.    Today, patient arrives in good spirits and presents without assistance. Denies dizziness, headache, blurred vision, swelling.   Hypertension longstanding currently above goal on current medications. BP goal < 130/80 mmHg. Medication adherence appears appropriate with spiro and valsartan but she is not taking clonidine. RAAS agents can induce or worsen renal insufficiency in patients with bilateral or several  unilateral renal artery stenosis. This pt has a hx of moderate L renal artery stenosis but R renal artery looked good from doppler study in 2021. She has an elevated urine microalbumin but eGFR looks good. Electrolyte status is nl and stable. Will get labs today and if renal function is good now ~1 month since starting valsartan, will optimize to the 320 mg dose. Will not add back thiazide at this time d/t gout history. Will not add amlodipine at this time d/t LE edema but we can consider low dose in the future if we need continued improvement if she can tolerate. -Continued current regimen for now. Will increase valsartan depending on lab results.   -CMP14+eGFR -Counseled on lifestyle modifications for blood pressure control including reduced dietary sodium, increased exercise, adequate sleep. -Encouraged patient to check BP at home and bring log of readings to next visit. Counseled on proper use  Labs from that visit did show normal creatinine and potassium.  Valsartan was increased at that visit to 320 mg daily. Patient's been out of Xarelto for a month.  She had a previous deep venous thrombosis 6 months ago involving the right thigh femoral vein.  She cannot afford the Xarelto the co-pay is too high.  She may need warfarin if she is to stay on anticoagulation.  She states she does have a blood pressure meter at home at home it runs 150/90.  She states her depression is still severe despite 5 mg of Lexapro.  She still smoking 1 pack a day of cigarettes.  02/26/22 This patient is seen in return follow-up not seen since July of this year.  She has had 1 hospitalization since I have seen her.  Below is a documentation prior to that hospitalization from cardiology where there was a concern of decreased volume depletion and question of stopping hydrochlorothiazide labs were taken and she had significant renal insufficiency and hypotension she was then directed to the emergency room the next day whereupon she  was admitted below the cardiology note is the discharge summary Cardiology 12/18/21 Expand All Collapse All    Office Visit    Patient Name: Stacy [name] Date  of Encounter: 12/18/2021   PCP:  [name], MD              [place]  Cardiologist:  Skeet Latch, MD  Advanced Practice Provider:  No care team member to display Electrophysiologist:  None       Chief Complaint    Stacy [name] is a 57 y.o. female presents today for follow up after echocardiogram.    Past Medical History        Past Medical History:  Diagnosis Date   Acute bronchitis 08/02/2019   Adrenal adenoma      as per MRI 03/09/14   AKI (acute kidney injury) ([place]) 12/20/2020   Allergy     Anxiety     Coronary artery calcification seen on CAT scan 04/12/2018   Depression     Diabetes mellitus     Diverticulosis     Elevated LFTs      elevated since 09/2013   Fatty liver      moderate- as per 03/06/17 MRI liver   GERD (gastroesophageal reflux disease)     Gout     H/O hiatal hernia     Headache(784.0)     History of depression     Hyperlipemia     Hyperplastic colon polyp     Hypertension     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 Posey Pronto 02/27/2014   Tobacco abuse     Tubular adenoma of colon           Past Surgical History:  Procedure Laterality Date   ANAL FISTULECTOMY   01/19/1985   BREAST EXCISIONAL BIOPSY Left     BREAST LUMPECTOMY Left 1988   PAROTIDECTOMY   03/30/2011    Procedure: PAROTIDECTOMY;  Surgeon: Izora Gala, MD;  Location: [facility];  Service: ENT;  Laterality: Right;   WRIST GANGLION EXCISION Left 1988      Allergies        Allergies  Allergen Reactions   Almond Meal Anaphylaxis and Swelling   Other Anaphylaxis      Almond Butter   Aspirin Nausea Only    History of  Present Illness    SEYLA SORG is a 57 y.o. female with a hx of mild CAD, HTN, HLD, fatty liver disease, elevated LFTs, tobacco use last seen 11/13/21.   Seen by Dr. Coralyn Mark and referred for CT for unintentional weight loss, tobacco use. CT noted mild to moderate calcified plaque in thoracic aorta and LAD coronary artery calcification. She was referred to cardiology. Coronary CTA 03/29/19 with mild plaque in LAD and minimal plaque in LCx. Atorvastatin previously increased however had elevated LFTs which normalized and she was increased ot 40mg .    HCTZ previously transitioned to Amlodipine due to hypokalemia. However, given elevated BP Valsartan-HCTZ initiated and Amlodipine reduced with improvement in LE edema. Renal artery dopplers with mild to moderate left renal artery stenosis. Labs negative for hyperaldosteronism.    When last seen 11/13/21 repeat lipid ordered with LDL 109. Updated renal artery doppler 11/2021 with stable 1-69% left renal artery stenosis. Echo ordered due to dyspnea with normal LVEF, no significant valvular abnormalities.    She presents today for follow up independently. She reports feeling a bit dizzy and unsteady in  the morning when she wakes up. Describes as a lightheaded feeling. It is worse after she takes her medications. Taking all of her antihypertensives in the morning. Not checking BP routinely at home.  Notes not much of an appetite but doesn't always eat with her medications.She feels she is losing weight and plans to discuss with PCP at upcoming follow up. She worries about being dehydrated.   CAD / HLD- Stable with no anginal symptoms. No indication for ischemic evaluation.  GDMT Atorvastatin. Heart healthy diet and regular cardiovascular exercise encouraged.  LDL not at goal <70. She is unsure what dose of Atorvastatin she is taking and will contact us. Pending response, could consider increased dose vs addition of Zetia.    Dyspnea - Echo 12/02/21 normal  LVEF, no significant valvular abnormalities. Reassurance provided. Likely etiology deconditioning.    Renal artery stenosis - 11/2021 L renal artery 1-59% stenosis, stable compared to previous. Repeat in 1 year for monitoring.    HTN - BP well controlled. Relatively hypotensive in clinic with morning lightheadedness. Move Amlodipine to evening.. Labs 12/02/21 with creatinine 1.33 recommended to stop HCTZ and start Hydralazine. She is hesitant regarding medication changes as she just picked up refill. Repeat BMP today. Encouraged to increase fluid intake. If creatinine remains elevated, consider reducing vs discontinuing Spironolactone as she requests to remain on Valsartan-HCTZ.    Tobacco use - Smoking cessation encouraged. Recommend utilization of [id number]. Following with care guide. Hesitant about Wellbutrin as mom did not tolerate.        Admit date: 12/19/2021 Discharge date: 12/24/2021  Barriers to discharge: none   Admitted From: Home  Disposition:  Home Discharging physician: [name], MD   Recommendations for Outpatient Follow-up:  Adjust metformin further if needed; repeat A1c   Home Health:  Equipment/Devices:    Discharge Condition: stable CODE STATUS: Full Diet recommendation:  Diet Orders (From admission, onward)        Start     Ordered    12/24/21 0000   Diet general        12/24/21 1228                  Hospital Course:   Hypoglycemia, improved -Suspect due to poor renal clearance of her antidiabetic medication of glimepiride in the setting of acute renal failure -CBGs improved prior to discharge - Glimepiride and actos discontinued at discharge - Continue on metformin -Repeat A1c in approximately 3 months   History of DVT (deep vein thrombosis) -Venous doppler on 07/2021 showed DVT of right popliteal vein -Resumed back on Xarelto at discharge   AKI Metabolic acidosis -Creatinine up to 3.40 from 0.63 baseline -Back to baseline prior to  discharge   GERD  -Continue PPI   Moderate to large hiatal hernia -Stable hiatal hernia again appreciated   Hypertension -Continue home regimen   Hyponatremia -Improved and normalized with fluids and diet advancement   Iron deficiency and microcytic Anemia - s/p EGD and colonoscopy on 12/23/2021 -Small Cameron erosions appreciated on EGD   Dysphagia -Underwent EGD on 12/23/2021 and esophageal stenosis dilated   Thyroid Mass on Right  -Thyroid ultrasound performed on 12/23/2021 noting 2 nodules that did not meet criteria for biopsy or dedicated follow-up   Depression and Anxiety -Continue with Escitalopram 10 mg p.o. daily as well as Trazodone 200 mg p.o. nightly   Hypoalbuminemia -Continue diet   Hypophosphatemia -Repleted   Gout, improving  S/p steroid use - continue home regimen   This is  essentially a transition of care visit and note that we were unable to reach the patient on her phone to get the visit done when the transition of care call occurred in mid December after discharge.  I tested her phone today and rings through but she does have a new phone it is possible she missed the calls for this appointment.  Patient states today she is having a cough productive of [place] mucus and wheezing and she is still smoking a pack a day of cigarettes.  On arrival blood pressure is 112/78 recheck 117/78  Patient expresses significant anxiety over not being employed and based on this and drinks 2 Canadian whiskey shots every night along with the active tobacco use.  Now she has not had further gout flares she is on the allopurinol.  She does need follow-up labs.  She also only eats 1 meal a day.  Her glycemic control has been somewhat up and down with this situation.  She does maintain oral medications for her diabetes and is now on the metformin.  With renal insufficiency she was on glipizide and this created a hypoglycemic event.  Blood sugars have been better since being  discharged.  She does need follow-up labs at this visit.  We have had her on Lexapro 10 mg daily she has seen minimal change in anxiety she was asking for Ativan today.  04/09/22 Patient seen in return follow-up she still smoking 1/2 pack a day of cigarettes occasionally uses alcohol but this is reduced.  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  Also type 2 diabetes.  Patient arrives with blood pressure the best we have seen 128/75  Patient is yet to get her lung cancer screening is being scheduled.  She does need a foot exam this visit.  She also needs follow-up eye exam. The patient has had nasal drainage and some nosebleeds on the Flonase Past Medical History:  Diagnosis Date   Acute bronchitis 08/02/2019   Adrenal adenoma    as per MRI 03/09/14   AKI (acute kidney injury) ([place]) 12/20/2020   Allergy    Anxiety    Coronary artery calcification seen on CAT scan 04/12/2018   Depression    Diabetes mellitus    Diverticulosis    Elevated LFTs    elevated since 09/2013   Fatty liver    moderate- as per 03/06/17 MRI liver   GERD (gastroesophageal reflux disease)    Gout    H/O hiatal hernia    Headache(784.0)    History of depression    Hyperlipemia    Hyperplastic colon polyp    Hypertension    Hypoglycemia 12/19/2021   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 Posey Pronto 02/27/2014   Tobacco abuse    Tubular adenoma of colon     Past Surgical History:  Procedure Laterality Date   ANAL FISTULECTOMY  01/19/1985   BALLOON DILATION N/A 12/23/2021   Procedure: BALLOON DILATION;  Surgeon: Gatha Mayer, MD;  Location: [facility];  Service: Gastroenterology;  Laterality: N/A;   BIOPSY  12/23/2021   Procedure: BIOPSY;  Surgeon: Gatha Mayer, MD;   Location: [facility];  Service: Gastroenterology;;   BREAST EXCISIONAL BIOPSY Left    BREAST LUMPECTOMY Left 1988  COLONOSCOPY WITH PROPOFOL N/A 12/23/2021   Procedure: COLONOSCOPY WITH PROPOFOL;  Surgeon: Gatha Mayer, MD;  Location: [facility];  Service: Gastroenterology;  Laterality: N/A;   ESOPHAGOGASTRODUODENOSCOPY (EGD) WITH PROPOFOL N/A 12/23/2021   Procedure: ESOPHAGOGASTRODUODENOSCOPY (EGD) WITH PROPOFOL;  Surgeon: Gatha Mayer, MD;  Location: [facility];  Service: Gastroenterology;  Laterality: N/A;   PAROTIDECTOMY  03/30/2011   Procedure: PAROTIDECTOMY;  Surgeon: Izora Gala, MD;  Location: [facility];  Service: ENT;  Laterality: Right;   WRIST GANGLION EXCISION Left 1988    Family History  Problem Relation Age of Onset   Coronary artery disease Father 39       CAD,CABG   Hypertension Mother    Stroke Mother    Diabetes Sister        uncontrolled   Cancer Sister 51       uterine cancer   Aneurysm Sister        brain   Heart attack Maternal Grandmother    Blindness Paternal Grandmother    Breast cancer Neg Hx    Colon cancer Neg Hx    Esophageal cancer Neg Hx    Rectal cancer Neg Hx    Stomach cancer Neg Hx     Social History   Socioeconomic History   Marital status: Widowed    Spouse name: Not on file   Number of children: 2   Years of education: Not on file   Highest education level: Not on file  Occupational History    Employer: [employer]  Tobacco Use   Smoking status: Every Day    Packs/day: 1.00    Years: 28.00    Additional pack years: 0.00    Total pack years: 28.00    Types: Cigarettes   Smokeless tobacco: Never  Vaping Use   Vaping Use: Never used  Substance and Sexual Activity   Alcohol use: Yes    Alcohol/week: 14.0 standard drinks of alcohol    Types: 14 Standard drinks or equivalent per week   Drug use: No   Sexual activity: Not Currently    Birth control/protection: Post-menopausal  Other  Topics Concern   Not on file  Social History Narrative   Married. 2 children.    Ages 69, 58 y/o ( entered 09/2012)      Works with [employer]   Social Determinants of Health   Financial Resource Strain: Not on file  Food Insecurity: No Food Insecurity (12/20/2021)   Hunger Vital Sign    Worried About Running Out of Food in the Last Year: Never true    Ran Out of Food in the Last Year: Never true  Transportation Needs: No Transportation Needs (12/20/2021)   PRAPARE - Hydrologist (Medical): No    Lack of Transportation (Non-Medical): No  Physical Activity: Not on file  Stress: Not on file  Social Connections: Not on file  Intimate Partner Violence: Not At Risk (12/20/2021)   Humiliation, Afraid, Rape, and Kick questionnaire    Fear of Current or Ex-Partner: No    Emotionally Abused: No    Physically Abused: No    Sexually Abused: No    Outpatient Medications Prior to Visit  Medication Sig Dispense Refill   albuterol (VENTOLIN HFA) 108 (90 Base) MCG/ACT inhaler Inhale 2 puffs into the lungs every 4 (four) hours as needed for wheezing or shortness of breath. 6.7 each 1   allopurinol (ZYLOPRIM) 300 MG tablet Take 1 tablet (  300 mg total) by mouth daily. 30 tablet 4   amLODipine (NORVASC) 10 MG tablet Take 1 tablet (10 mg total) by mouth daily. 90 tablet 2   colchicine 0.6 MG tablet Take 1 tablet (0.6 mg total) by mouth 2 (two) times daily as needed (gout flare). 30 tablet 1   EPINEPHrine 0.3 mg/0.3 mL IJ SOAJ injection Insert into muscle once with any facial or neck swelling  Instruct in use 1 Device 1   escitalopram (LEXAPRO) 20 MG tablet Take 1 tablet (20 mg total) by mouth daily. 60 tablet 2   ferrous sulfate 325 (65 FE) MG EC tablet Take 1 tablet (325 mg total) by mouth daily with breakfast. 60 tablet 3   fluticasone (FLONASE) 50 MCG/ACT nasal spray Place 2 sprays into both nostrils daily. 16 g 6   folic acid (FOLVITE) 1 MG tablet  TAKE 1 TABLET BY MOUTH EVERY DAY 90 tablet 1   glucose blood (FREESTYLE PRECISION NEO TEST) test strip Use as instructed 100 each 12   Lancets (FREESTYLE) lancets Use as instructed 100 each 12   metFORMIN (GLUCOPHAGE-XR) 500 MG 24 hr tablet Take 2 tablets (1,000 mg total) by mouth daily with supper. 120 tablet 3   Multiple Vitamin (MULTIVITAMIN WITH MINERALS) TABS tablet Take 1 tablet by mouth daily.     pantoprazole (PROTONIX) 40 MG tablet Take 1 tablet (40 mg total) by mouth daily. 30 tablet 4   rosuvastatin (CRESTOR) 20 MG tablet Take 20 mg by mouth daily.     spironolactone (ALDACTONE) 25 MG tablet Take 1 tablet (25 mg total) by mouth daily. 90 tablet 3   traZODone (DESYREL) 100 MG tablet Take 2 tablets (200 mg total) by mouth at bedtime. 60 tablet 1   valsartan (DIOVAN) 320 MG tablet Take 1 tablet (320 mg total) by mouth daily. 90 tablet 3   XARELTO 20 MG TABS tablet Take 1 tablet (20 mg total) by mouth every evening. 30 tablet 2   atorvastatin (LIPITOR) 10 MG tablet Take by mouth.     cefdinir (OMNICEF) 300 MG capsule Take 1 capsule (300 mg total) by mouth 2 (two) times daily. 14 capsule 0   pioglitazone (ACTOS) 15 MG tablet      rosuvastatin (CRESTOR) 40 MG tablet Take 1 tablet (40 mg total) by mouth daily. (Patient not taking: Reported on 04/09/2022) 90 tablet 3   No facility-administered medications prior to visit.    Allergies  Allergen Reactions   Almond Meal Anaphylaxis and Swelling   Other Anaphylaxis    Almond Butter   Aspirin Nausea Only    Other Reaction(s): Not available, Unknown    ROS Review of Systems  Constitutional:  Negative for appetite change and fatigue.  HENT:  Positive for postnasal drip. Negative for ear pain, rhinorrhea, sinus pressure, sore throat, trouble swallowing and voice change.   Eyes: Negative.   Respiratory:  Negative for apnea, cough, choking, chest tightness, shortness of breath, wheezing and stridor.   Cardiovascular: Negative.  Negative for  chest pain, palpitations and leg swelling.  Gastrointestinal: Negative.  Negative for abdominal distention, abdominal pain, nausea and vomiting.  Endocrine: Negative.   Genitourinary: Negative.  Negative for dysuria and hematuria.  Musculoskeletal:  Negative for arthralgias, joint swelling and myalgias.  Skin: Negative.  Negative for rash.  Allergic/Immunologic: Negative.  Negative for environmental allergies and food allergies.  Neurological:  Negative for dizziness, syncope, weakness and headaches.  Hematological: Negative.  Negative for adenopathy. Does not bruise/bleed easily.  Psychiatric/Behavioral:  Negative for agitation, dysphoric mood, sleep disturbance and suicidal ideas. The patient is not nervous/anxious.       Objective:    Physical Exam Vitals reviewed.  Constitutional:      Appearance: Normal appearance. She is well-developed and normal weight. She is not diaphoretic.  HENT:     Head: Normocephalic and atraumatic.     Nose: Congestion present. No nasal deformity, septal deviation, mucosal edema or rhinorrhea.     Right Sinus: No maxillary sinus tenderness or frontal sinus tenderness.     Left Sinus: No maxillary sinus tenderness or frontal sinus tenderness.     Comments: Bilateral purulence in naris    Mouth/Throat:     Mouth: Mucous membranes are moist.     Pharynx: Oropharynx is clear. No oropharyngeal exudate.  Eyes:     General: No scleral icterus.    Conjunctiva/sclera: Conjunctivae normal.     Pupils: Pupils are equal, round, and reactive to light.  Neck:     Thyroid: No thyromegaly.     Vascular: No carotid bruit or JVD.     Trachea: Trachea normal. No tracheal tenderness or tracheal deviation.  Cardiovascular:     Rate and Rhythm: Normal rate and regular rhythm.     Chest Wall: PMI is not displaced.     Pulses: Normal pulses. No decreased pulses.     Heart sounds: Normal heart sounds, S1 normal and S2 normal. Heart sounds not distant. No murmur heard.     No systolic murmur is present.     No diastolic murmur is present.     No friction rub. No gallop. No S3 or S4 sounds.  Pulmonary:     Effort: Pulmonary effort is normal. No tachypnea, accessory muscle usage or respiratory distress.     Breath sounds: No stridor. No decreased breath sounds, wheezing, rhonchi or rales.  Chest:     Chest wall: No tenderness.  Abdominal:     General: Abdomen is flat. Bowel sounds are normal. There is no distension.     Palpations: Abdomen is soft. Abdomen is not rigid. There is no mass.     Tenderness: There is no abdominal tenderness. There is no guarding or rebound.  Musculoskeletal:        General: No tenderness. Normal range of motion.     Cervical back: Normal range of motion and neck supple. No edema, erythema or rigidity. No muscular tenderness. Normal range of motion.     Right lower leg: No edema.     Left lower leg: No edema.     Right foot: Tenderness: tophi present on metatarsal of first toe.     Left foot: Normal.     Comments: No pain or edema or tenderness in the right leg There is bruising behind the posterior aspect of the left knee  Lymphadenopathy:     Head:     Right side of head: No submental or submandibular adenopathy.     Left side of head: No submental or submandibular adenopathy.     Cervical: No cervical adenopathy.  Skin:    General: Skin is warm and dry.     Coloration: Skin is not pale.     Findings: No rash.     Nails: There is no clubbing.  Neurological:     Mental Status: She is alert and oriented to person, place, and time.     Sensory: No sensory deficit.  Psychiatric:        Attention and Perception:  Attention and perception normal.        Mood and Affect: Affect normal. Mood is anxious.        Speech: Speech normal.        Behavior: Behavior normal.        Thought Content: Thought content normal.        Cognition and Memory: Cognition and memory normal.        Judgment: Judgment normal.     BP 128/75    Pulse (!) 108   Ht 5\' 3"  (1.6 m)   Wt 130 lb (59 kg)   SpO2 98%   BMI 23.03 kg/m  Wt Readings from Last 3 Encounters:  04/09/22 130 lb (59 kg)  02/26/22 131 lb 9.6 oz (59.7 kg)  12/23/21 130 lb 4.7 oz (59.1 kg)     Health Maintenance Due  Topic Date Due   OPHTHALMOLOGY EXAM  06/10/2017   Lung Cancer Screening  03/28/2020   COVID-19 Vaccine (3 - 2023-24 season) 09/19/2021   FOOT EXAM  02/04/2022    There are no preventive care reminders to display for this patient. Vas US Renal Artery Bilateral 12/19/20  Right: Normal size right kidney. Normal right Resisitive Index. No         evidence of right renal artery stenosis. RRV flow present.  Left:  Normal size of left kidney. Normal left Resistive Index.         1-59% stenosis of the left renal artery. LRV flow present.  Mesenteric:  Normal Celiac artery findings.      Cor CT 03/29/19   IMPRESSION: LAD is a large vessel with mild (25-49%) calcified plaque in the proximal portion. The mid and distal portion with no plaque. D1 small vessel with no plaque. LCX is a non-dominant artery that gives rise to one large OM1 branch. There is minimal (1-24%) calcified plaque in the proximal portion of the LCX. The mid and distal portion with no plaque. There is no plaque in the OM1 branch. 1. Coronary calcium score of 50.4. This was 63 percentile for age and sex matched control. 2. Normal coronary origin with right dominance. 3. Mild Coronary artery disease. CADRADS 2. Recommend medical therapy. 4. Mild calcification of the aortic root.  Venous Dopplers February 24 reviewed showed DVT common femoral vein right leg and some clot up in the right iliac vein Lab Results  Component Value Date   TSH 0.808 12/21/2021   Lab Results  Component Value Date   WBC 9.9 02/26/2022   HGB 12.3 02/26/2022   HCT 36.5 02/26/2022   MCV 78 (L) 02/26/2022   PLT 387 02/26/2022   Lab Results  Component Value Date   NA 133 (L) 02/26/2022   K 4.6  02/26/2022   CO2 22 02/26/2022   GLUCOSE 117 (H) 02/26/2022   BUN 17 02/26/2022   CREATININE 1.51 (H) 02/26/2022   BILITOT 0.3 02/26/2022   ALKPHOS 72 02/26/2022   AST 18 02/26/2022   ALT 19 02/26/2022   PROT 7.6 02/26/2022   ALBUMIN 4.4 02/26/2022   CALCIUM 10.5 (H) 02/26/2022   ANIONGAP 8 12/24/2021   EGFR 40 (L) 02/26/2022   GFR 95.71 06/06/2019   Lab Results  Component Value Date   CHOL 245 (H) 02/26/2022   Lab Results  Component Value Date   HDL 72 02/26/2022   Lab Results  Component Value Date   LDLCALC 142 (H) 02/26/2022   Lab Results  Component Value Date   TRIG 174 (H) 02/26/2022  Lab Results  Component Value Date   CHOLHDL 3.4 02/26/2022   Lab Results  Component Value Date   HGBA1C 6.5 (H) 02/26/2022      Assessment & Plan:   Problem List Items Addressed This Visit       Cardiovascular and Mediastinum   Hypertension    Hypertension under excellent control we will continue with amlodipine 10 mg daily and Aldactone 25 mg daily and valsartan 320 mg daily.  Patient will return to advanced hypertension clinic for follow-up      Relevant Medications   rosuvastatin (CRESTOR) 20 MG tablet   Acute deep vein thrombosis (DVT) of femoral vein of right lower extremity (HCC)    Continue with Xarelto      Relevant Medications   rosuvastatin (CRESTOR) 20 MG tablet   Coronary artery disease of native artery of native heart with stable angina pectoris ([place])    Follow-up per cardiology patient will continue with current medications stay on rosuvastatin 20 mg daily      Relevant Medications   rosuvastatin (CRESTOR) 20 MG tablet     Endocrine   Type 2 diabetes mellitus with other specified complication (HCC) - Primary    A1c has been at goal continue with metformin as prescribed      Relevant Medications   rosuvastatin (CRESTOR) 20 MG tablet   Other Relevant Orders   Ambulatory referral to Ophthalmology     Other   Hyperlipemia    Continue  rosuvastatin      Relevant Medications   rosuvastatin (CRESTOR) 20 MG tablet   Tobacco use    Discussed smoking cessation. Rx sent for nicotine patches and lozenges. Referral to [facility], LCSW for behavioral therapy.      Current smoking consumption amount: 1 ppd  Dicsussion on advise to quit smoking and smoking impacts: CV lung impacts  Patient's willingness to quit:  Interested   Methods to quit smoking discussed:  Behavioral mod /meds  Medication management of smoking session drugs discussed:nicotine replacement: combo of nicotine patch and lozenge  Resources provided:  AVS   Setting quit date not established  Follow-up arranged 2 mo   Time spent counseling the patient:  5 min       EtOH dependence ([place])    Counseled on reducing alcohol intake there is no risk for withdrawal      Moderate episode of recurrent major depressive disorder ([place])    Improved on increased dose of Lexapro      Encounter for screening for lung cancer    Lung cancer screening test pending     No orders of the defined types were placed in this encounter. 35 minutes spent complex decision making multisystems assessed review of multiple associate consultant notes education to patient  Congratulated patient on excellent improvement Follow-up: Return in about 4 months (around 08/09/2022) for htn, diabetes.    Asencion Noble, MD

## 2022-04-09 NOTE — Assessment & Plan Note (Signed)
Continue rosuvastatin.  

## 2022-04-28 LAB — HM MAMMOGRAPHY

## 2022-05-05 ENCOUNTER — Telehealth: Payer: Self-pay | Admitting: Critical Care Medicine

## 2022-05-05 NOTE — Telephone Encounter (Signed)
[facility] mammogram asymmetric breast seen they plan an ultrasound they will call her  I signed the mammogram report pls have it scanned for HM documentation

## 2022-05-05 NOTE — Telephone Encounter (Signed)
Noted  

## 2022-06-29 ENCOUNTER — Other Ambulatory Visit: Payer: Self-pay | Admitting: Critical Care Medicine

## 2022-07-28 ENCOUNTER — Other Ambulatory Visit (HOSPITAL_BASED_OUTPATIENT_CLINIC_OR_DEPARTMENT_OTHER): Payer: Self-pay

## 2022-07-28 ENCOUNTER — Encounter (HOSPITAL_BASED_OUTPATIENT_CLINIC_OR_DEPARTMENT_OTHER): Payer: Self-pay | Admitting: Emergency Medicine

## 2022-07-28 ENCOUNTER — Emergency Department (HOSPITAL_BASED_OUTPATIENT_CLINIC_OR_DEPARTMENT_OTHER)
Admission: EM | Admit: 2022-07-28 | Discharge: 2022-07-28 | Disposition: A | Discharge status: Home / Self Care | Payer: BLUE CROSS/BLUE SHIELD | Attending: Emergency Medicine | Admitting: Emergency Medicine

## 2022-07-28 ENCOUNTER — Other Ambulatory Visit: Payer: Self-pay

## 2022-07-28 ENCOUNTER — Emergency Department (HOSPITAL_BASED_OUTPATIENT_CLINIC_OR_DEPARTMENT_OTHER): Payer: BLUE CROSS/BLUE SHIELD | Admitting: Radiology

## 2022-07-28 DIAGNOSIS — I10 Essential (primary) hypertension: Secondary | ICD-10-CM | POA: Insufficient documentation

## 2022-07-28 DIAGNOSIS — M79671 Pain in right foot: Secondary | ICD-10-CM

## 2022-07-28 DIAGNOSIS — S92331A Displaced fracture of third metatarsal bone, right foot, initial encounter for closed fracture: Secondary | ICD-10-CM | POA: Insufficient documentation

## 2022-07-28 DIAGNOSIS — Z7984 Long term (current) use of oral hypoglycemic drugs: Secondary | ICD-10-CM | POA: Insufficient documentation

## 2022-07-28 DIAGNOSIS — F17200 Nicotine dependence, unspecified, uncomplicated: Secondary | ICD-10-CM | POA: Insufficient documentation

## 2022-07-28 DIAGNOSIS — Z79899 Other long term (current) drug therapy: Secondary | ICD-10-CM | POA: Insufficient documentation

## 2022-07-28 DIAGNOSIS — S92341A Displaced fracture of fourth metatarsal bone, right foot, initial encounter for closed fracture: Secondary | ICD-10-CM | POA: Diagnosis not present

## 2022-07-28 DIAGNOSIS — W1839XA Other fall on same level, initial encounter: Secondary | ICD-10-CM | POA: Insufficient documentation

## 2022-07-28 DIAGNOSIS — S99921A Unspecified injury of right foot, initial encounter: Secondary | ICD-10-CM | POA: Diagnosis present

## 2022-07-28 DIAGNOSIS — E119 Type 2 diabetes mellitus without complications: Secondary | ICD-10-CM | POA: Diagnosis not present

## 2022-07-28 DIAGNOSIS — S92301A Fracture of unspecified metatarsal bone(s), right foot, initial encounter for closed fracture: Secondary | ICD-10-CM

## 2022-07-28 DIAGNOSIS — Y92003 Bedroom of unspecified non-institutional (private) residence as the place of occurrence of the external cause: Secondary | ICD-10-CM | POA: Insufficient documentation

## 2022-07-28 DIAGNOSIS — Z7901 Long term (current) use of anticoagulants: Secondary | ICD-10-CM | POA: Insufficient documentation

## 2022-07-28 DIAGNOSIS — I2510 Atherosclerotic heart disease of native coronary artery without angina pectoris: Secondary | ICD-10-CM | POA: Insufficient documentation

## 2022-07-28 NOTE — ED Triage Notes (Signed)
Pt arrives to ED with c/o fall on 7/4. Pt notes she fell over a cord in her bedroom. She notes right foot pain.

## 2022-07-28 NOTE — ED Notes (Signed)
Pt given discharge instructions. Opportunities given for questions. Pt verbalizes understanding. Grisell Bissette R, RN 

## 2022-07-28 NOTE — ED Provider Notes (Signed)
[place] EMERGENCY DEPARTMENT AT [facility] Provider Note   CSN: [id number] Arrival date & time: 07/28/22  1107     History  Chief Complaint  Patient presents with   Stacy [name] is a 57 y.o. female.   Fall   57 year old female presents emergency department with complaints of fall.  Patient states that she was walking upstairs on July 4 and tripped over a cord causing pain in her right foot.  Denies pain elsewhere.  Denies trauma to head, loss consciousness, blood thinner use.  Denies any chest pain, abdominal pain, nausea, vomiting.  States that she laid on the ground approximate 20 minutes before she is able to get herself up.  States that initially, was unable to bear weight for the first 2 to 3 days.  She found crutches as well as a postop shoe from prior left foot fracture and has been using both to help aid in ambulation since then.  Denies any weakness or sensory deficits in affected foot.  Reports due to continued right foot pain.  Past medical history significant for renal artery stenosis, AKI, CAD, thyroid nodule, hyperlipidemia, hypertension, GERD, tobacco abuse, adrenal adenoma, diabetes mellitus type 2, DVT,  Home Medications Prior to Admission medications   Medication Sig Start Date End Date Taking? Authorizing Provider  albuterol (VENTOLIN HFA) 108 (90 Base) MCG/ACT inhaler Inhale 2 puffs into the lungs every 4 (four) hours as needed for wheezing or shortness of breath. 02/26/22   Storm Frisk, MD  allopurinol (ZYLOPRIM) 300 MG tablet Take 1 tablet (300 mg total) by mouth daily. 02/26/22   Storm Frisk, MD  amLODipine (NORVASC) 10 MG tablet Take 1 tablet (10 mg total) by mouth daily. 02/26/22   Storm Frisk, MD  colchicine 0.6 MG tablet Take 1 tablet (0.6 mg total) by mouth 2 (two) times daily as needed (gout flare). 10/28/21   Storm Frisk, MD  EPINEPHrine 0.3 mg/0.3 mL IJ SOAJ injection Insert into muscle once with any facial or neck  swelling  Instruct in use 01/25/17   [place], Velna Hatchet, MD  escitalopram (LEXAPRO) 20 MG tablet Take 1 tablet (20 mg total) by mouth daily. 02/26/22   Storm Frisk, MD  ferrous sulfate 325 (65 FE) MG EC tablet Take 1 tablet (325 mg total) by mouth daily with breakfast. 12/24/21 12/24/22  Lewie Chamber, MD  fluticasone (FLONASE) 50 MCG/ACT nasal spray Place 2 sprays into both nostrils daily. 02/26/22   Storm Frisk, MD  folic acid (FOLVITE) 1 MG tablet TAKE 1 TABLET BY MOUTH EVERY DAY 06/29/22   Storm Frisk, MD  glucose blood (FREESTYLE PRECISION NEO TEST) test strip Use as instructed 02/04/21   Storm Frisk, MD  Lancets (FREESTYLE) lancets Use as instructed 02/04/21   Storm Frisk, MD  metFORMIN (GLUCOPHAGE-XR) 500 MG 24 hr tablet Take 2 tablets (1,000 mg total) by mouth daily with supper. 02/26/22   Storm Frisk, MD  Multiple Vitamin (MULTIVITAMIN WITH MINERALS) TABS tablet Take 1 tablet by mouth daily. 12/25/21   Lewie Chamber, MD  pantoprazole (PROTONIX) 40 MG tablet Take 1 tablet (40 mg total) by mouth daily. 02/26/22   Storm Frisk, MD  rosuvastatin (CRESTOR) 20 MG tablet Take 20 mg by mouth daily.    [provider]  spironolactone (ALDACTONE) 25 MG tablet Take 1 tablet (25 mg total) by mouth daily. 02/26/22 05/27/22  Storm Frisk, MD  traZODone (DESYREL) 100 MG  tablet Take 2 tablets (200 mg total) by mouth at bedtime. 02/26/22   Storm Frisk, MD  valsartan (DIOVAN) 320 MG tablet Take 1 tablet (320 mg total) by mouth daily. 02/26/22   Storm Frisk, MD  XARELTO 20 MG TABS tablet Take 1 tablet (20 mg total) by mouth every evening. 02/26/22   Storm Frisk, MD  Lancets MISC Use as instructed to monitor FSBS 3x daily. Dx: E11.65 01/03/15   Salley Scarlet, MD      Allergies    Laurita Quint, Other, and Aspirin    Review of Systems   Review of Systems  All other systems reviewed and are negative.   Physical Exam Updated Vital Signs BP 115/86 (BP  Location: Left Arm)   Pulse (!) 110   Temp 98.3 F (36.8 C) (Oral)   Resp 16   Ht 5\' 3"  (1.6 m)   Wt 56.7 kg   SpO2 100%   BMI 22.14 kg/m  Physical Exam Vitals and nursing note reviewed.  Constitutional:      General: She is not in acute distress.    Appearance: She is well-developed.  HENT:     Head: Normocephalic and atraumatic.  Eyes:     Conjunctiva/sclera: Conjunctivae normal.  Cardiovascular:     Rate and Rhythm: Normal rate and regular rhythm.     Heart sounds: No murmur heard. Pulmonary:     Effort: Pulmonary effort is normal. No respiratory distress.     Breath sounds: Normal breath sounds.  Abdominal:     Palpations: Abdomen is soft.     Tenderness: There is no abdominal tenderness.  Musculoskeletal:        General: No swelling.     Cervical back: Neck supple.     Comments: Patient with swelling and ecchymosis appreciated on the dorsal aspect of right foot.  Tender to palpation of metatarsals 2 through 4 dorsally.  Patient with full range of motion of right knee, ankle, digits.  Pedal and posterior tibial pulses 2+ bilaterally.  No sensory deficits distally.  No tenderness to palpation of medial/lateral malleolus or base of fifth metatarsal.  Skin:    General: Skin is warm and dry.     Capillary Refill: Capillary refill takes less than 2 seconds.  Neurological:     Mental Status: She is alert.  Psychiatric:        Mood and Affect: Mood normal.     ED Results / Procedures / Treatments   Labs (all labs ordered are listed, but only abnormal results are displayed) Labs Reviewed - No data to display  EKG None  Radiology DG Foot Complete Right  Result Date: 07/28/2022 CLINICAL DATA:  Pain, recent fall EXAM: RIGHT FOOT COMPLETE - 3+ VIEW COMPARISON:  None Available. FINDINGS: Osteopenia is seen in bony structures. Recent fractures are seen in the neck of right third and fourth metatarsals with minimal angulation at the fracture sites. In the oblique view, there  is possible cortical irregularity in the base of proximal phalanx of fourth toe. In the AP view, there is minimal focal bulge in the lateral cortical margin of head of the proximal phalanx of fifth toe. There is also possible mild indentation in the lateral cortical margin of base of the proximal phalanx of fifth toe. There is flattening of plantar arch. Bony spurs are seen in first metatarsophalangeal joint. Bony spurs are noted in the dorsal aspects of intertarsal joints. There is soft tissue swelling over the dorsum. IMPRESSION:  Recent slightly angulated fractures are seen in the necks of right third and fourth metatarsals. Possible undisplaced fractures are noted in the proximal phalanx of fifth toe and base of proximal phalanx of fourth toe. Electronically Signed   By: Ernie Avena M.D.   On: 07/28/2022 12:37    Procedures Procedures    Medications Ordered in ED Medications - No data to display  ED Course/ Medical Decision Making/ A&P                             Medical Decision Making Amount and/or Complexity of Data Reviewed Radiology: ordered.   This patient presents to the ED for concern of foot pain, this involves an extensive number of treatment options, and is a complaint that carries with it a high risk of complications and morbidity.  The differential diagnosis includes fracture, dislocation, strain/pain, ligamentous/tendinous injury, neurovascular compromise, cellulitis, Supples   Co morbidities that complicate the patient evaluation  See HPI   Additional history obtained:  Additional history obtained from EMR External records from outside source obtained and reviewed including hospital records   Lab Tests:  N/a   Imaging Studies ordered:  I ordered imaging studies including right foot x-ray I independently visualized and interpreted imaging which showed recent slightly angulated fractures of neck of right third and fourth metatarsals.  Possible  undisplaced fracture of proximal phalanx of fifth and fourth toes. I agree with the radiologist interpretation  Cardiac Monitoring: / EKG:  The patient was maintained on a cardiac monitor.  I personally viewed and interpreted the cardiac monitored which showed an underlying rhythm of: Sinus rhythm   Consultations Obtained:  N/a   Problem List / ED Course / Critical interventions / Medication management  Right foot pain Reevaluation of the patient showed that the patient stayed the same I have reviewed the patients home medicines and have made adjustments as needed   Social Determinants of Health:  Chronic cigarette use.  Denies illicit drug use.   Test / Admission - Considered:  Right foot pain Vitals signs within normal range and stable throughout visit. Imaging studies significant for: See above 57 year old female presents emergency department after mechanical fall occurred 5 days ago tripping over a cord and with persistent right-sided foot pain since then.  Patient's workup today overall consistent with third and fourth metatarsal neck fractures as well as possible proximal phalanx fractures of fourth and fifth digits.  Patient without reproducible tenderness of proximal phalanx of the fourth and fifth digits on the right foot but with concern for metatarsal fractures as indicated above.  Patient already with postop shoe and crutches at home however recommend nonweightbearing status and follow-up with her orthopedics Dr. Ophelia Charter in the outpatient setting.  Patient without evidence of neurovascular compromise or secondary skin changes concerning for complicating infection.  Will recommend rest, ice, elevation affected extremity and Tylenol given patient's history of kidney dysfunction.  Treatment plan discussed at length with patient and she acknowledged understanding was agreeable to said plan.  Patient overall well-appearing, afebrile in no acute distress. Worrisome signs and  symptoms were discussed with the patient, and the patient acknowledged understanding to return to the ED if noticed. Patient was stable upon discharge.         Final Clinical Impression(s) / ED Diagnoses Final diagnoses:  Right foot pain  Closed fracture of neck of metatarsal bone of right foot, initial encounter    Rx / DC Orders  ED Discharge Orders     None         Peter Garter, [place] 07/28/22 1525    Maia Plan, MD 07/30/22 [phone]

## 2022-07-28 NOTE — Discharge Instructions (Addendum)
As discussed, x-ray imaging showed multiple fractures of your right foot of your third and fourth metatarsal as well as fourth and fifth toes.  Recommend continued use of surgical shoe in the outpatient setting as well as crutches to help aid in ambulation.  Do not put any weight on affected foot.  Call your orthopedic at your earliest convenience to set up an appointment.  Continue to rest, ice, elevate affected extremity and take Tylenol for pain.  Please do not hesitate to return to emergency department for worrisome signs and symptoms we discussed become apparent.

## 2022-07-28 NOTE — ED Notes (Signed)
Per Provider PT has a post op shoe and crutches at home. Will use at home per the discharge paperwork.

## 2022-08-13 ENCOUNTER — Ambulatory Visit: Payer: Medicaid Other | Attending: Critical Care Medicine | Admitting: Critical Care Medicine

## 2022-08-13 ENCOUNTER — Encounter: Payer: Self-pay | Admitting: Critical Care Medicine

## 2022-08-13 VITALS — BP 129/90 | HR 99 | Ht 63.0 in | Wt 120.2 lb

## 2022-08-13 DIAGNOSIS — R102 Pelvic and perineal pain: Secondary | ICD-10-CM | POA: Diagnosis not present

## 2022-08-13 DIAGNOSIS — I82411 Acute embolism and thrombosis of right femoral vein: Secondary | ICD-10-CM

## 2022-08-13 DIAGNOSIS — I701 Atherosclerosis of renal artery: Secondary | ICD-10-CM

## 2022-08-13 DIAGNOSIS — Z124 Encounter for screening for malignant neoplasm of cervix: Secondary | ICD-10-CM | POA: Insufficient documentation

## 2022-08-13 DIAGNOSIS — J309 Allergic rhinitis, unspecified: Secondary | ICD-10-CM | POA: Insufficient documentation

## 2022-08-13 DIAGNOSIS — E7800 Pure hypercholesterolemia, unspecified: Secondary | ICD-10-CM

## 2022-08-13 DIAGNOSIS — F331 Major depressive disorder, recurrent, moderate: Secondary | ICD-10-CM

## 2022-08-13 DIAGNOSIS — I10 Essential (primary) hypertension: Secondary | ICD-10-CM | POA: Diagnosis not present

## 2022-08-13 DIAGNOSIS — E041 Nontoxic single thyroid nodule: Secondary | ICD-10-CM

## 2022-08-13 DIAGNOSIS — E1169 Type 2 diabetes mellitus with other specified complication: Secondary | ICD-10-CM

## 2022-08-13 DIAGNOSIS — R0982 Postnasal drip: Secondary | ICD-10-CM

## 2022-08-13 DIAGNOSIS — Z720 Tobacco use: Secondary | ICD-10-CM

## 2022-08-13 LAB — POCT GLYCOSYLATED HEMOGLOBIN (HGB A1C): HbA1c, POC (controlled diabetic range): 5.9 % (ref 0.0–7.0)

## 2022-08-13 LAB — GLUCOSE, POCT (MANUAL RESULT ENTRY): POC Glucose: 151 mg/dl — AB (ref 70–99)

## 2022-08-13 MED ORDER — VALSARTAN 320 MG PO TABS: 320.0000 mg | ORAL_TABLET | Freq: Every day | ORAL | 3 refills | Status: DC

## 2022-08-13 MED ORDER — PANTOPRAZOLE SODIUM 40 MG PO TBEC: 40.0000 mg | DELAYED_RELEASE_TABLET | Freq: Every day | ORAL | 4 refills | Status: DC

## 2022-08-13 MED ORDER — ALLOPURINOL 300 MG PO TABS: 300.0000 mg | ORAL_TABLET | Freq: Every day | ORAL | 4 refills | Status: DC

## 2022-08-13 MED ORDER — METFORMIN HCL ER 500 MG PO TB24: 1000.0000 mg | ORAL_TABLET | Freq: Every day | ORAL | 3 refills | Status: DC

## 2022-08-13 MED ORDER — AZELASTINE HCL 0.1 % NA SOLN: 2.0000 | Freq: Two times a day (BID) | NASAL | 12 refills | Status: AC

## 2022-08-13 MED ORDER — COLCHICINE 0.6 MG PO TABS: 0.6000 mg | ORAL_TABLET | Freq: Two times a day (BID) | ORAL | 1 refills | Status: AC | PRN

## 2022-08-13 MED ORDER — FERROUS SULFATE 325 (65 FE) MG PO TBEC: 325.0000 mg | DELAYED_RELEASE_TABLET | Freq: Every day | ORAL | Status: DC

## 2022-08-13 MED ORDER — ROSUVASTATIN CALCIUM 20 MG PO TABS: 20.0000 mg | ORAL_TABLET | Freq: Every day | ORAL | 2 refills | Status: DC

## 2022-08-13 MED ORDER — ESCITALOPRAM OXALATE 20 MG PO TABS: 20.0000 mg | ORAL_TABLET | Freq: Every day | ORAL | 2 refills | Status: AC

## 2022-08-13 MED ORDER — MIRTAZAPINE 15 MG PO TBDP: 15.0000 mg | ORAL_TABLET | Freq: Every evening | ORAL | 3 refills | Status: DC | PRN

## 2022-08-13 MED ORDER — FOLIC ACID 1 MG PO TABS: 1.0000 mg | ORAL_TABLET | Freq: Every day | ORAL | 1 refills | Status: DC

## 2022-08-13 MED ORDER — AMLODIPINE BESYLATE 10 MG PO TABS: 10.0000 mg | ORAL_TABLET | Freq: Every day | ORAL | 2 refills | Status: DC

## 2022-08-13 MED ORDER — OLOPATADINE HCL 0.1 % OP SOLN: 1.0000 [drp] | Freq: Two times a day (BID) | OPHTHALMIC | 12 refills | Status: AC

## 2022-08-13 MED ORDER — SPIRONOLACTONE 25 MG PO TABS: 25.0000 mg | ORAL_TABLET | Freq: Every day | ORAL | 3 refills | Status: DC

## 2022-08-13 NOTE — Assessment & Plan Note (Signed)
Continue with wound we will given

## 2022-08-13 NOTE — Assessment & Plan Note (Signed)
Referral to behavioral therapist and continue escitalopram 20 mg daily

## 2022-08-13 NOTE — Assessment & Plan Note (Signed)
Maintain metformin and assess labs

## 2022-08-13 NOTE — Assessment & Plan Note (Addendum)
Hypertension with improved control based on advanced hypertension clinic support.  No change in current medications reassess labs  Dilation of renal artery has helped

## 2022-08-13 NOTE — Assessment & Plan Note (Signed)
Discussed smoking cessation. Rx sent for nicotine patches and lozenges. Referral to [place], LCSW for behavioral therapy.      Current smoking consumption amount: 1 ppd  Dicsussion on advise to quit smoking and smoking impacts: CV lung impacts  Patient's willingness to quit:  Interested   Methods to quit smoking discussed:  Behavioral mod /meds  Medication management of smoking session drugs discussed:nicotine replacement: combo of nicotine patch and lozenge  Resources provided:  AVS   Setting quit date not established  Follow-up arranged 2 mo   Time spent counseling the patient:  5 min  

## 2022-08-13 NOTE — Assessment & Plan Note (Signed)
Refer to gynecology for Pap smear and women's health exam

## 2022-08-13 NOTE — Assessment & Plan Note (Addendum)
Check urinalysis. 

## 2022-08-13 NOTE — Assessment & Plan Note (Signed)
Likely needs to come off Xarelto we will have her hold for now reassess D-dimer and assess ultrasound of right lower leg

## 2022-08-13 NOTE — Assessment & Plan Note (Addendum)
Associated postnasal drainage and allergic conjunctivitis plan will be to prescribe Patanol eyedrops and Astelin nasal spray Referral to ophthalmology for general eye exam and diabetic exam

## 2022-08-13 NOTE — Assessment & Plan Note (Signed)
Reassess thyroid function ?

## 2022-08-13 NOTE — Addendum Note (Signed)
Addended by: Shan Levans E on: 08/13/2022 03:07 PM   Modules accepted: Orders

## 2022-08-13 NOTE — Patient Instructions (Signed)
Labs today except come back for D Dimer lab later Hold xarelto All other medications refilled Referrals were made Imagine of right leg made Return Dr Delford Field 6 weeks

## 2022-08-13 NOTE — Assessment & Plan Note (Addendum)
Patient with renal artery stenosis status post intervention

## 2022-08-13 NOTE — Progress Notes (Addendum)
NEW Patient Office Visit  Subjective:  Patient ID: Stacy [name], female    DOB: 1965-11-06  Age: 57 y.o. MRN: [id number]  CC:  Chief Complaint  Patient presents with   Diabetes   Hypertension    HPI 01/2021  [name] presents for follow up after hospitalization for an AKI/acute renal failure and electrolyte imbalance that resulted from severe vomiting. She was concurrently treated for an acute gout flare at that time. While in the hospital, she received nephrology and orthopedics consult. Her kidney function improved during her hospital stay, and she was cleared by nephrology for discharge. She received bilateral knee arthrocentesis and steroid injection of her knees to treat her gout. She is in need of an MRI for her knees, and has just gotten insurance approval for this procedure. She is attempting to schedule the MRI. The following includes the summary from discharge:   "57 y.o. female with medical history significant of HTN, HLD, GERD, T2DM, depression with anxiety, gout, nonobstructive CAD, left renal artery stenosis, tobacco abuse treated with abdominal pain, nausea and vomiting and not feeling well along with bilateral knee pain.  On presentation, she was slightly hypotensive, tachycardic.  WBC of 12.4; BUN of 77, creatinine of 9.5, lipase of 62.  Chest x-ray showed no acute findings.  Ultrasound of abdomen showed no acute findings to explain her abdominal pain.  She was started on IV fluids.  Nephrology was consulted.  During the hospitalization, her condition has gradually improved; creatinine is improving.  Orthopedics was consulted and patient underwent bilateral knee arthrocentesis and steroid injection; synovial fluid was consistent with gout flare.  She was also treated with oral prednisone.  Currently her renal function has much improved, nephrology has cleared the patient for discharge.  Currently she is hemodynamically stable, tolerating diet.  She will be discharged  home today on oral prednisone and allopurinol will be started.  Follow-up with PCP and orthopedics.  Follow-up with nephrology if needed."  Today, the patient states that she has improved since her hospital stay. She has had follow ups with multiple providers for her other medical issues. She followed up with cardiology on 01/24/21 and was started on spironolactone. She has a diagnosis of mild nonobstructive CAD that is being medically managed at this point in time. She previously took a statin for this issue, but it was stopped during hospitalization due to elevated liver transaminase. She has been referred to the lipid clinic for further management of this issue. She followed up with endocrinology for her diabetes earlier today. She is currently on Amaryl and Actos, and metformin was added back to her regimen today. She checks her blood sugars in the morning, and reports they have typically been between 105-110 mg/dL.   [name] gout symptoms remain troublesome. She is having significant pain in her right knee from the gout flare. She is taking Vicodin for this. She is unable to do any weight-bearing on this leg. She also has pain from the gout flare in her right foot. Her other concern today is that she is having significant trouble with sleeping. She has difficulty falling asleep and staying asleep, and this has been significantly impacting her mood. She has experienced racing thoughts when trying to fall asleep, and she has felt down and depressed.  The patient states that she is drinking less alcohol than previously. She now drinks two whiskeys 3-4 times per week, rather than every day. She still smokes, less than 1 PPD. She  states she has been smoking a little more previously due to feelings of stress.  Notes reviewed below :  Cardiology OV 01/24/21.  Hosp DC from early 12/2020  Admit date: 12/19/2020 Discharge date: 12/22/2020   Admitted From: Home Disposition: Home   Recommendations for  Outpatient Follow-up:  Follow up with PCP in 1 week with repeat CBC/BMP Outpatient follow-up with nephrology if needed Outpatient follow-up with orthopedics/Dr. Ophelia Charter Follow up in ED if symptoms worsen or new appear     Home Health: No Equipment/Devices: None   Discharge Condition: Stable CODE STATUS: Full Diet recommendation: Heart healthy/carb modified   Brief/Interim Summary: 57 y.o. female with medical history significant of HTN, HLD, GERD, T2DM, depression with anxiety, gout, nonobstructive CAD, left renal artery stenosis, tobacco abuse treated with abdominal pain, nausea and vomiting and not feeling well along with bilateral knee pain.  On presentation, she was slightly hypotensive, tachycardic.  WBC of 12.4; BUN of 77, creatinine of 9.5, lipase of 62.  Chest x-ray showed no acute findings.  Ultrasound of abdomen showed no acute findings to explain her abdominal pain.  She was started on IV fluids.  Nephrology was consulted.  During the hospitalization, her condition has gradually improved; creatinine is improving.  Orthopedics was consulted and patient underwent bilateral knee arthrocentesis and steroid injection; synovial fluid was consistent with gout flare.  She was also treated with oral prednisone.  Currently her renal function has much improved, nephrology has cleared the patient for discharge.  Currently she is hemodynamically stable, tolerating diet.  She will be discharged home today on oral prednisone and allopurinol will be started.  Follow-up with PCP and orthopedics.  Follow-up with nephrology if needed.   Discharge Diagnoses:    Acute kidney injury/acute renal failure Anion gap metabolic acidosis -Possibly from ATN from dehydration and hypotension -Presented with creatinine of 9.5; treated with IV fluids.  Creatinine much improved to 1.57 today.  - Renal ultrasound negative for hydronephrosis  -nephrology following and has cleared the patient for discharge.  Outpatient  follow-up with nephrology if needed. -Outpatient follow-up of BMP by PCP   Possible UTI -Present on admission.  Treated with 3 days of Rocephin.  No need for any further antibiotics on discharge.  Currently afebrile.   Bilateral knee pain and swelling/?  Acute gout flare -Patient states that she has had similar symptoms in the past requiring arthrocentesis which was consistent with acute gout.   - Avoid colchicine because of recent nausea and vomiting -Status post bilateral arthrocentesis by orthopedics on 12/20/2020: Synovial fluid analysis consistent with acute gout.  Bilateral knees were injected with steroids per orthopedics on 12/20/2020. -Continue oral prednisone on discharge 40 mg daily for 7 days.  Outpatient follow-up with orthopedics.     Nausea/vomiting -Questionable cause.  Ultrasound abdomen was negative for acute cause/gallbladder disease. -Much improved.  Currently tolerating diet.   Right upper quadrant pain/shoulder pain -Might be musculoskeletal as well.  Lipase only mildly elevated.  Troponin negative.  Continue current pain management.   Hypomagnesemia -Resolved   Mild hyperkalemia -Resolved   Leukocytosis -Possibly reactive.  Resolved   Renal artery stenosis -Renal duplex showed 1 to 59% stenosis of left renal 3.;  No evidence of right renal artery stenosis.   Normocytic anemia -Questionable cause.  Hemoglobin stable.  Outpatient follow-up   Fatty liver  Mildly elevated AST -Possibly from alcohol use.  Outpatient follow-up with GI.  Abstain from alcohol   Hypertension -Blood pressure on the lower side.  Antihypertensives  on hold   Hyperlipidemia -Statin on hold because of slightly elevated LFTs.  Outpatient follow-up with PCP.   GERD -Has completed Protonix treatment as an outpatient.  Follow-up with PCP.   Depression with anxiety -Stable.  Continue Remeron   Adrenal mass, right -Has outpatient follow-up   Tobacco use -Continue nicotine patch.   Patient was counseled regarding cessation by admitting hospitalist  Cards 1/6    Essential hypertension: Medications were held during hospitalization 12/22 due to acute renal failure and hypotension. She was restarted on clonidine 0.2 mg 3 times a day.  Initial clinic blood pressure is elevated, however on my recheck it has actually decreased to 100/64.  She reports high blood pressure readings at home.  She would like to take as few pills as possible and would like something that may help with lower extremity edema.  We will restart spironolactone 25 mg once daily and reduce clonidine to 0.2 mg twice daily.  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  We will plan to follow-up on her blood pressure in 3 to 6 months depending on her availability.  She reports she currently is missing a lot of work due to multiple appointments and recent hospitalization.   Mild nonobstructive CAD: Coronary CT March 2021 revealed mild disease in the LAD and LCx.  Her coronary calcium score is 50.4, 94th percentile for age/sex, mild calcification of the aortic root.  Medical therapy recommended.  She denies chest pain, dyspnea or other symptoms concerning for angina.  No further ischemic evaluation warranted at this time.  Her statin was stopped during hospitalization 12/22 due to elevated transaminase.  This was also a problem in the past for her.  We will refer her to lipid clinic for management of lipids.  Aspirin was stopped during hospitalization due to anemia.  Would favor restarting aspirin 81 mg at next visit if blood counts have stabilized.   Hyperlipidemia LDL goal < 70: LDL 75 6/22.  She was previously on atorvastatin 40 mg.  As noted above statin was stopped during recent hospitalization due to elevated transaminase.  We will refer her to lipid clinic for management.   Bilateral leg edema: She has 1+ pitting edema in both lower extremities.   She is having a significant amount of right knee swelling and pain due to gout.  She requests a blood pressure medicine that may help reduce some of the swelling.  We will restart spironolactone 25 mg as noted above she wants to take as few pills as possible and this is a potassium sparing diuretic.  Advised that she may not notice significant improvement in joint swelling with spironolactone.  Encouraged leg elevation (toes to nose) to help decrease leg swelling.   Difficulty sleeping: She states she is having difficulty sleeping because her mind is racing and she is having knee pain and requests a prescription for Ambien. States melatonin does not work for her. I encouraged her to use nonpharmacologic methods to help her sleep and suggested the Calm app or other methods that may help her calm her mind before going to sleep.  Encouraged leg elevation to help with pain and swelling.   Tobacco abuse: She continues to smoke less than 1/2 packs/day. Has reduced daytime smoking. Feels that her son who has recently moved back to [place] may help her quit because he is a big encouragement. Complete cessation advised.   04/07/21  Patient seen in return follow-up.  Patient has hypertension coronary artery calcification diabetes on oral therapy.  On arrival blood pressure 134/90 blood sugar 152.  Patient's had low blood sugars at times.  She is also had low blood pressures on clonidine therefore this drug was stopped.  She does maintain low-dose Aldactone.  She had increased pain and swelling in the right thigh and orthopedics appropriately ordered ultrasound of the lower extremities finding DVT in the right iliac vein and this was on February 24 of this year..  She has now been on the Xarelto.  She is completing the starter pack.  She has had no bleeding problems on Xarelto.  The leg pain is improved.  She still smoking less than 1/2 pack a day of cigarettes.  She was drinking 2 Crown Royal's a day but has  reduced her intake.  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  She lost her job because of all her health issues she was not able to attend regularly at work.  Patient did receive a Pap smear and it was normal.  She follows with endocrinology.  She is also been counseling with our licensed clinical social worker with her depression.  She continues to have insomnia and is requesting a higher dose of the trazodone.  Appetite is poor.  She would like some health screening labs obtained at this visit.  06/12/21 This patient is seen in return for follow-up is a 57 year old female with type 2 diabetes ethanol use tobacco use hypertension.  On arrival blood pressure is elevated 150/80 and she is having difficulty maintaining her diabetic program.  She is losing significant amounts of weight and has had a chronic cough.  We gave her a course of azithromycin a week ago and it persists.  She has some postnasal drainage and significant allergy symptoms.  She is drinking 3 [place] whiskeys daily.  She struggled to get off the alcohol.  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  She has been on Xarelto therapy now for 2 months.  Patient's had no bleeding with Xarelto  Patient continues to smoke a pack a day of cigarettes  Patient does not follow a healthy diet  Patient follows with endocrinology and they have her on oral Actos Amaryl and metformin.  She is often holding these medications.  She takes the medication she has severe nausea.  She is only eating 1 meal a day and cannot take in much food.  Blood pressure medicines now include the clonidine 0.2 mg twice daily and spironolactone 25 mg daily  She measures her blood pressure at home she gets around 140/90 usually.  7/27 Patient returns in follow-up and had  been out of medications for 2 weeks over the past week she was caring for her father.  She has been back on her medicines now for about 2 days and on arrival blood pressure elevated 167/103  Patient is no longer taking clonidine because of hypotension but she does maintain Aldactone 25 mg daily and valsartan 320 mg daily.  Patient did see our clinical pharmacist Franky Macho on June 30 is as documented below.  Saw Franky Macho 6/30 Trinnity R Houchins is a 57 y.o. female who presents for hypertension evaluation, education, and management.  PMH is significant for HTN, CAD, unilateral (L)  renal artery stenosis, migraines, VTE on anticoagulation with Xarelto, GERD, T2DM, bilateral knee synovitis, HLD, GOUT, MDD, tobacco use and alcohol use.    Patient was referred and last seen by Primary Care Provider, Dr. Delford Field, on 06/12/2021.    At last visit, Dr. Delford Field added back valsartan to her regimen.    Recent BP management hx:  Last seen by Dr. Duke Salvia in 2022. Was controlled on amlodipine, HCTZ, valsartan, and spiro. Was hospitalized 12/1 - 12/22/2020 with AKI and her antihypertensives were held. She was restarted on clonidine 0.2 mg TID at that time. NP at [facility] restarted spiro and decreased clonidine on 01/24/21. Of note, pt found to have 1+ pitting edema at that visit. Clonidine was reduced to 0.1 mg BID d/t hypotension by Dr. Lucianne Muss on 02/04/21. We have seen her a couple of times since with BP being at or close to goal on 1/17 and 04/07/2021. Most recently, pt was seen 06/12/2021 and BP was above goal. Valsartan was restarted.    Today, patient arrives in good spirits and presents without assistance. Denies dizziness, headache, blurred vision, swelling.   Hypertension longstanding currently above goal on current medications. BP goal < 130/80 mmHg. Medication adherence appears appropriate with spiro and valsartan but she is not taking clonidine. RAAS agents can induce or worsen renal insufficiency in patients with bilateral  or several unilateral renal artery stenosis. This pt has a hx of moderate L renal artery stenosis but R renal artery looked good from doppler study in 2021. She has an elevated urine microalbumin but eGFR looks good. Electrolyte status is nl and stable. Will get labs today and if renal function is good now ~1 month since starting valsartan, will optimize to the 320 mg dose. Will not add back thiazide at this time d/t gout history. Will not add amlodipine at this time d/t LE edema but we can consider low dose in the future if we need continued improvement if she can tolerate. -Continued current regimen for now. Will increase valsartan depending on lab results.   -CMP14+eGFR -Counseled on lifestyle modifications for blood pressure control including reduced dietary sodium, increased exercise, adequate sleep. -Encouraged patient to check BP at home and bring log of readings to next visit. Counseled on proper use  Labs from that visit did show normal creatinine and potassium.  Valsartan was increased at that visit to 320 mg daily. Patient's been out of Xarelto for a month.  She had a previous deep venous thrombosis 6 months ago involving the right thigh femoral vein.  She cannot afford the Xarelto the co-pay is too high.  She may need warfarin if she is to stay on anticoagulation.  She states she does have a blood pressure meter at home at home it runs 150/90.  She states her depression is still severe despite 5 mg of Lexapro.  She still smoking 1 pack a day of cigarettes.  02/26/22 This patient is seen in return follow-up not seen since July of this year.  She has had 1 hospitalization since I have seen her.  Below is a documentation prior to that hospitalization from cardiology where there was a concern of decreased volume depletion and question of stopping hydrochlorothiazide labs were taken and she had significant renal insufficiency and hypotension she was then directed to the emergency room the next day  whereupon she was admitted below the cardiology note is the discharge summary Cardiology 12/18/21 Expand All Collapse All    Office Visit    Patient Name:  Dorisann Frames Date of Encounter: 12/18/2021   PCP:  Storm Frisk, MD              [facility]  Cardiologist:  Chilton Si, MD  Advanced Practice Provider:  No care team member to display Electrophysiologist:  None       Chief Complaint    KEILI HASTEN is a 57 y.o. female presents today for follow up after echocardiogram.    Past Medical History        Past Medical History:  Diagnosis Date   Acute bronchitis 08/02/2019   Adrenal adenoma      as per MRI 03/09/14   AKI (acute kidney injury) (HCC) 12/20/2020   Allergy     Anxiety     Coronary artery calcification seen on CAT scan 04/12/2018   Depression     Diabetes mellitus     Diverticulosis     Elevated LFTs      elevated since 09/2013   Fatty liver      moderate- as per 03/06/17 MRI liver   GERD (gastroesophageal reflux disease)     Gout     H/O hiatal hernia     Headache(784.0)     History of depression     Hyperlipemia     Hyperplastic colon polyp     Hypertension     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 Allena Katz 02/27/2014   Tobacco abuse     Tubular adenoma of colon           Past Surgical History:  Procedure Laterality Date   ANAL FISTULECTOMY   01/19/1985   BREAST EXCISIONAL BIOPSY Left     BREAST LUMPECTOMY Left 1988   PAROTIDECTOMY   03/30/2011    Procedure: PAROTIDECTOMY;  Surgeon: Serena Colonel, MD;  Location: [facility];  Service: ENT;  Laterality: Right;   WRIST GANGLION EXCISION Left 1988      Allergies        Allergies  Allergen Reactions   Almond Meal Anaphylaxis and Swelling   Other Anaphylaxis      Almond Butter   Aspirin Nausea Only     History of Present Illness    KHIANNA BLAZINA is a 57 y.o. female with a hx of mild CAD, HTN, HLD, fatty liver disease, elevated LFTs, tobacco use last seen 11/13/21.   Seen by Dr. Constance Goltz and referred for CT for unintentional weight loss, tobacco use. CT noted mild to moderate calcified plaque in thoracic aorta and LAD coronary artery calcification. She was referred to cardiology. Coronary CTA 03/29/19 with mild plaque in LAD and minimal plaque in LCx. Atorvastatin previously increased however had elevated LFTs which normalized and she was increased ot 40mg .    HCTZ previously transitioned to Amlodipine due to hypokalemia. However, given elevated BP Valsartan-HCTZ initiated and Amlodipine reduced with improvement in LE edema. Renal artery dopplers with mild to moderate left renal artery stenosis. Labs negative for hyperaldosteronism.    When last seen 11/13/21 repeat lipid ordered with LDL 109. Updated renal artery doppler 11/2021 with stable 1-69% left renal artery stenosis. Echo ordered due to dyspnea with normal LVEF, no significant valvular abnormalities.    She presents today for follow up independently. She reports feeling a bit  dizzy and unsteady in the morning when she wakes up. Describes as a lightheaded feeling. It is worse after she takes her medications. Taking all of her antihypertensives in the morning. Not checking BP routinely at home.  Notes not much of an appetite but doesn't always eat with her medications.She feels she is losing weight and plans to discuss with PCP at upcoming follow up. She worries about being dehydrated.   CAD / HLD- Stable with no anginal symptoms. No indication for ischemic evaluation.  GDMT Atorvastatin. Heart healthy diet and regular cardiovascular exercise encouraged.  LDL not at goal <70. She is unsure what dose of Atorvastatin she is taking and will contact us. Pending response, could consider increased dose vs addition of Zetia.    Dyspnea - Echo  12/02/21 normal LVEF, no significant valvular abnormalities. Reassurance provided. Likely etiology deconditioning.    Renal artery stenosis - 11/2021 L renal artery 1-59% stenosis, stable compared to previous. Repeat in 1 year for monitoring.    HTN - BP well controlled. Relatively hypotensive in clinic with morning lightheadedness. Move Amlodipine to evening.. Labs 12/02/21 with creatinine 1.33 recommended to stop HCTZ and start Hydralazine. She is hesitant regarding medication changes as she just picked up refill. Repeat BMP today. Encouraged to increase fluid intake. If creatinine remains elevated, consider reducing vs discontinuing Spironolactone as she requests to remain on Valsartan-HCTZ.    Tobacco use - Smoking cessation encouraged. Recommend utilization of [id number]. Following with care guide. Hesitant about Wellbutrin as mom did not tolerate.        Admit date: 12/19/2021 Discharge date: 12/24/2021  Barriers to discharge: none   Admitted From: Home  Disposition:  Home Discharging physician: Lewie Chamber, MD   Recommendations for Outpatient Follow-up:  Adjust metformin further if needed; repeat A1c   Home Health:  Equipment/Devices:    Discharge Condition: stable CODE STATUS: Full Diet recommendation:  Diet Orders (From admission, onward)        Start     Ordered    12/24/21 0000   Diet general        12/24/21 1228                  Hospital Course:   Hypoglycemia, improved -Suspect due to poor renal clearance of her antidiabetic medication of glimepiride in the setting of acute renal failure -CBGs improved prior to discharge - Glimepiride and actos discontinued at discharge - Continue on metformin -Repeat A1c in approximately 3 months   History of DVT (deep vein thrombosis) -Venous doppler on 07/2021 showed DVT of right popliteal vein -Resumed back on Xarelto at discharge   AKI Metabolic acidosis -Creatinine up to 3.40 from 0.63 baseline -Back to  baseline prior to discharge   GERD  -Continue PPI   Moderate to large hiatal hernia -Stable hiatal hernia again appreciated   Hypertension -Continue home regimen   Hyponatremia -Improved and normalized with fluids and diet advancement   Iron deficiency and microcytic Anemia - s/p EGD and colonoscopy on 12/23/2021 -Small Cameron erosions appreciated on EGD   Dysphagia -Underwent EGD on 12/23/2021 and esophageal stenosis dilated   Thyroid Mass on Right  -Thyroid ultrasound performed on 12/23/2021 noting 2 nodules that did not meet criteria for biopsy or dedicated follow-up   Depression and Anxiety -Continue with Escitalopram 10 mg p.o. daily as well as Trazodone 200 mg p.o. nightly   Hypoalbuminemia -Continue diet   Hypophosphatemia -Repleted   Gout, improving  S/p steroid use - continue home regimen  This is essentially a transition of care visit and note that we were unable to reach the patient on her phone to get the visit done when the transition of care call occurred in mid December after discharge.  I tested her phone today and rings through but she does have a new phone it is possible she missed the calls for this appointment.  Patient states today she is having a cough productive of Belvin mucus and wheezing and she is still smoking a pack a day of cigarettes.  On arrival blood pressure is 112/78 recheck 117/78  Patient expresses significant anxiety over not being employed and based on this and drinks 2 Canadian whiskey shots every night along with the active tobacco use.  Now she has not had further gout flares she is on the allopurinol.  She does need follow-up labs.  She also only eats 1 meal a day.  Her glycemic control has been somewhat up and down with this situation.  She does maintain oral medications for her diabetes and is now on the metformin.  With renal insufficiency she was on glipizide and this created a hypoglycemic event.  Blood sugars have been better  since being discharged.  She does need follow-up labs at this visit.  We have had her on Lexapro 10 mg daily she has seen minimal change in anxiety she was asking for Ativan today.  04/09/22 Patient seen in return follow-up she still smoking 1/2 pack a day of cigarettes occasionally uses alcohol but this is reduced.  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  Also type 2 diabetes.  Patient arrives with blood pressure the best we have seen 128/75  Patient is yet to get her lung cancer screening is being scheduled.  She does need a foot exam this visit.  She also needs follow-up eye exam. The patient has had nasal drainage and some nosebleeds on the Flonase  08/13/22 This is a 57 year old female seen in return follow-up last seen in March.  She presents today with a large number of complaints.  She states she has had a chronic night sweats and will awaken with swollen eyes.  She also has nasal congestion postnasal drainage.  She has flank pain both lower back area and suprapubic pain for more than a month.  He says she sees some change in urination.  She would like to have her eyes checked by general ophthalmologist and have a woman's health exam by gynecology along with a Pap smear.  She has had significant depression and alcohol use and she wishes to see a behavioral therapist that accepts Medicaid.  She has been on Remeron in the past for insomnia she will defer this over the trazodone now.  She has watery eyes nasal congestion postnasal drip and dry cough that is constant for many months.  She had a history of deep venous thrombosis right lower extremity that reduced to a popliteal vein thrombosis this began in February of last year and orthopedics in July of last year send maintain for now patient is well past the date for consideration for discontinuance of the Xarelto.  She has no pain in the leg at this time.   Blood pressure today is 125/85.  She needs multiple medications renewed.  She is followed in the advanced hypertension clinic.  She is on the amlodipine 10 mg daily spironolactone 25 mg daily and valsartan 320 mg daily she needs multiple refills  on medications.  Would benefit from thyroid function rechecked. 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 Allena Katz 02/27/2014   Tobacco abuse    Tubular adenoma of colon     Past Surgical History:  Procedure Laterality Date   ANAL FISTULECTOMY  01/19/1985   BALLOON DILATION N/A 12/23/2021   Procedure: BALLOON DILATION;  Surgeon: Iva Boop, MD;  Location: [facility];  Service: Gastroenterology;  Laterality: N/A;   BIOPSY  12/23/2021   Procedure: BIOPSY;  Surgeon: Iva Boop, MD;  Location: [facility];  Service: Gastroenterology;;   BREAST EXCISIONAL BIOPSY Left    BREAST LUMPECTOMY Left 1988   COLONOSCOPY WITH PROPOFOL N/A 12/23/2021   Procedure: COLONOSCOPY WITH PROPOFOL;  Surgeon: Iva Boop, MD;  Location: [facility];  Service: Gastroenterology;  Laterality: N/A;   ESOPHAGOGASTRODUODENOSCOPY (EGD) WITH PROPOFOL N/A 12/23/2021   Procedure: ESOPHAGOGASTRODUODENOSCOPY (EGD) WITH PROPOFOL;  Surgeon:  Iva Boop, MD;  Location: [facility];  Service: Gastroenterology;  Laterality: N/A;   PAROTIDECTOMY  03/30/2011   Procedure: PAROTIDECTOMY;  Surgeon: Serena Colonel, MD;  Location: [facility];  Service: ENT;  Laterality: Right;   WRIST GANGLION EXCISION Left 1988    Family History  Problem Relation Age of Onset   Coronary artery disease Father 49       CAD,CABG   Hypertension Mother    Stroke Mother    Diabetes Sister        uncontrolled   Cancer Sister 23       uterine cancer   Aneurysm Sister        brain   Heart attack Maternal Grandmother    Blindness Paternal Grandmother    Breast cancer Neg Hx    Colon cancer Neg Hx    Esophageal cancer Neg Hx    Rectal cancer Neg Hx    Stomach cancer Neg Hx     Social History   Socioeconomic History   Marital status: Widowed    Spouse name: Not on file   Number of children: 2   Years of education: Not on file   Highest education level: Not on file  Occupational History    Employer: [employer]  Tobacco Use   Smoking status: Every Day    Current packs/day: 1.00    Average packs/day: 1 pack/day for 28.0 years (28.0 ttl pk-yrs)    Types: Cigarettes   Smokeless tobacco: Never  Vaping Use   Vaping status: Never Used  Substance and Sexual Activity   Alcohol use: Yes    Alcohol/week:  14.0 standard drinks of alcohol    Types: 14 Standard drinks or equivalent per week    Comment: occ   Drug use: No   Sexual activity: Not Currently    Birth control/protection: Post-menopausal  Other Topics Concern   Not on file  Social History Narrative   Married. 2 children.    Ages 76, 51 y/o ( entered 09/2012)      Works with [employer]   Social Determinants of Health   Financial Resource Strain: Not on file  Food Insecurity: No Food Insecurity (12/20/2021)   Hunger Vital Sign    Worried About Running Out of Food in the Last Year: Never true    Ran Out of Food in the Last Year:  Never true  Transportation Needs: No Transportation Needs (12/20/2021)   PRAPARE - Administrator, Civil Service (Medical): No    Lack of Transportation (Non-Medical): No  Physical Activity: Not on file  Stress: Not on file  Social Connections: Not on file  Intimate Partner Violence: Not At Risk (12/20/2021)   Humiliation, Afraid, Rape, and Kick questionnaire    Fear of Current or Ex-Partner: No    Emotionally Abused: No    Physically Abused: No    Sexually Abused: No    Outpatient Medications Prior to Visit  Medication Sig Dispense Refill   albuterol (VENTOLIN HFA) 108 (90 Base) MCG/ACT inhaler Inhale 2 puffs into the lungs every 4 (four) hours as needed for wheezing or shortness of breath. 6.7 each 1   allopurinol (ZYLOPRIM) 300 MG tablet Take 1 tablet (300 mg total) by mouth daily. 30 tablet 4   amLODipine (NORVASC) 10 MG tablet Take 1 tablet (10 mg total) by mouth daily. 90 tablet 2   colchicine 0.6 MG tablet Take 1 tablet (0.6 mg total) by mouth 2 (two) times daily as needed (gout flare). 30 tablet 1   EPINEPHrine 0.3 mg/0.3 mL IJ SOAJ injection Insert into muscle once with any facial or neck swelling  Instruct in use 1 Device 1   escitalopram (LEXAPRO) 20 MG tablet Take 1 tablet (20 mg total) by mouth daily. 60 tablet 2   ferrous sulfate 325 (65 FE) MG EC tablet Take 1 tablet (325 mg total) by mouth daily with breakfast. 60 tablet 3   folic acid (FOLVITE) 1 MG tablet TAKE 1 TABLET BY MOUTH EVERY DAY 90 tablet 1   glucose blood (FREESTYLE PRECISION NEO TEST) test strip Use as instructed 100 each 12   Lancets (FREESTYLE) lancets Use as instructed 100 each 12   metFORMIN (GLUCOPHAGE-XR) 500 MG 24 hr tablet Take 2 tablets (1,000 mg total) by mouth daily with supper. 120 tablet 3   Multiple Vitamin (MULTIVITAMIN WITH MINERALS) TABS tablet Take 1 tablet by mouth daily.     pantoprazole (PROTONIX) 40 MG tablet Take 1 tablet (40 mg total) by mouth daily. 30 tablet 4    rosuvastatin (CRESTOR) 20 MG tablet Take 20 mg by mouth daily.     traZODone (DESYREL) 100 MG tablet Take 2 tablets (200 mg total) by mouth at bedtime. 60 tablet 1   valsartan (DIOVAN) 320 MG tablet Take 1 tablet (320 mg total) by mouth daily. 90 tablet 3   XARELTO 20 MG TABS tablet Take 1 tablet (20 mg total) by mouth every evening. 30 tablet 2   fluticasone (FLONASE) 50 MCG/ACT nasal spray Place 2 sprays into both nostrils daily. 16 g 6   spironolactone (ALDACTONE) 25 MG tablet  Take 1 tablet (25 mg total) by mouth daily. 90 tablet 3   No facility-administered medications prior to visit.    Allergies  Allergen Reactions   Almond Mea Anaphylaxis and Swelling   Other Anaphylaxis    Almond Butter   Aspirin Nausea Only    Other Reaction(s): Not available, Unknown    ROS Review of Systems  Constitutional:  Positive for diaphoresis and fatigue. Negative for appetite change.  HENT:  Positive for postnasal drip, rhinorrhea and sneezing. Negative for ear pain, sinus pressure, sore throat, trouble swallowing and voice change.   Eyes:  Positive for redness and itching.  Respiratory:  Positive for cough. Negative for apnea, choking, chest tightness, shortness of breath, wheezing and stridor.   Cardiovascular: Negative.  Negative for chest pain, palpitations and leg swelling.  Gastrointestinal:  Positive for abdominal pain. Negative for abdominal distention, nausea and vomiting.  Endocrine: Negative.   Genitourinary: Negative.  Negative for dysuria and hematuria.  Musculoskeletal:  Positive for back pain. Negative for arthralgias, joint swelling and myalgias.  Skin: Negative.  Negative for rash.  Allergic/Immunologic: Negative.  Negative for environmental allergies and food allergies.  Neurological:  Negative for dizziness, syncope, weakness and headaches.  Hematological: Negative.  Negative for adenopathy. Does not bruise/bleed easily.  Psychiatric/Behavioral:  Negative for agitation,  dysphoric mood, sleep disturbance and suicidal ideas. The patient is not nervous/anxious.       Objective:    Physical Exam Vitals reviewed.  Constitutional:      Appearance: Normal appearance. She is well-developed and normal weight. She is not diaphoretic.  HENT:     Head: Normocephalic and atraumatic.     Nose: Congestion and rhinorrhea present. No nasal deformity, septal deviation or mucosal edema.     Right Sinus: No maxillary sinus tenderness or frontal sinus tenderness.     Left Sinus: No maxillary sinus tenderness or frontal sinus tenderness.     Comments: Bilateral purulence in naris    Mouth/Throat:     Mouth: Mucous membranes are moist.     Pharynx: Oropharynx is clear. No oropharyngeal exudate or posterior oropharyngeal erythema.  Eyes:     General: No scleral icterus.       Right eye: Discharge present.        Left eye: Discharge present.    Pupils: Pupils are equal, round, and reactive to light.     Comments: Erythema and conjunctiva  Neck:     Thyroid: No thyromegaly.     Vascular: No carotid bruit or JVD.     Trachea: Trachea normal. No tracheal tenderness or tracheal deviation.  Cardiovascular:     Rate and Rhythm: Normal rate and regular rhythm.     Chest Wall: PMI is not displaced.     Pulses: Normal pulses. No decreased pulses.     Heart sounds: Normal heart sounds, S1 normal and S2 normal. Heart sounds not distant. No murmur heard.    No systolic murmur is present.     No diastolic murmur is present.     No friction rub. No gallop. No S3 or S4 sounds.  Pulmonary:     Effort: Pulmonary effort is normal. No tachypnea, accessory muscle usage or respiratory distress.     Breath sounds: No stridor. No decreased breath sounds, wheezing, rhonchi or rales.  Chest:     Chest wall: No tenderness.  Abdominal:     General: Abdomen is flat. Bowel sounds are normal. There is no distension.     Palpations:  Abdomen is soft. Abdomen is not rigid. There is no mass.      Tenderness: There is abdominal tenderness. There is right CVA tenderness and left CVA tenderness. There is no guarding or rebound.     Comments: Suprapubic tenderness  Musculoskeletal:        General: No tenderness. Normal range of motion.     Cervical back: Normal range of motion and neck supple. No edema, erythema or rigidity. No muscular tenderness. Normal range of motion.     Right lower leg: No edema.     Left lower leg: No edema.     Right foot: Tenderness: tophi present on metatarsal of first toe.     Left foot: Normal.     Comments: No pain or edema or tenderness in the right leg There is bruising behind the posterior aspect of the left knee  Lymphadenopathy:     Head:     Right side of head: No submental or submandibular adenopathy.     Left side of head: No submental or submandibular adenopathy.     Cervical: No cervical adenopathy.  Skin:    General: Skin is warm and dry.     Coloration: Skin is not pale.     Findings: No rash.     Nails: There is no clubbing.  Neurological:     Mental Status: She is alert and oriented to person, place, and time.     Sensory: No sensory deficit.  Psychiatric:        Attention and Perception: Attention and perception normal.        Mood and Affect: Affect normal. Mood is anxious.        Speech: Speech normal.        Behavior: Behavior normal.        Thought Content: Thought content normal.        Cognition and Memory: Cognition and memory normal.        Judgment: Judgment normal.     BP (!) 129/90 (BP Location: Left Arm, Patient Position: Sitting, Cuff Size: Normal)   Pulse 99   Ht 5\' 3"  (1.6 m)   Wt 120 lb 3.2 oz (54.5 kg)   SpO2 99%   BMI 21.29 kg/m  Wt Readings from Last 3 Encounters:  08/13/22 120 lb 3.2 oz (54.5 kg)  07/28/22 125 lb (56.7 kg)  04/09/22 130 lb (59 kg)     Health Maintenance Due  Topic Date Due   Zoster Vaccines- Shingrix (1 of 2) Never done   OPHTHALMOLOGY EXAM  06/10/2017   Lung Cancer Screening   03/28/2020   COVID-19 Vaccine (3 - 2023-24 season) 09/19/2021   Diabetic kidney evaluation - Urine ACR  06/13/2022    There are no preventive care reminders to display for this patient. Vas US Renal Artery Bilateral 12/19/20  Right: Normal size right kidney. Normal right Resisitive Index. No         evidence of right renal artery stenosis. RRV flow present.  Left:  Normal size of left kidney. Normal left Resistive Index.         1-59% stenosis of the left renal artery. LRV flow present.  Mesenteric:  Normal Celiac artery findings.      Cor CT 03/29/19   IMPRESSION: LAD is a large vessel with mild (25-49%) calcified plaque in the proximal portion. The mid and distal portion with no plaque. D1 small vessel with no plaque. LCX is a non-dominant artery that gives rise to one  large OM1 branch. There is minimal (1-24%) calcified plaque in the proximal portion of the LCX. The mid and distal portion with no plaque. There is no plaque in the OM1 branch. 1. Coronary calcium score of 50.4. This was 58 percentile for age and sex matched control. 2. Normal coronary origin with right dominance. 3. Mild Coronary artery disease. CADRADS 2. Recommend medical therapy. 4. Mild calcification of the aortic root.  Venous Dopplers February 24 reviewed showed DVT common femoral vein right leg and some clot up in the right iliac vein Lab Results  Component Value Date   TSH 0.808 12/21/2021   Lab Results  Component Value Date   WBC 9.9 02/26/2022   HGB 12.3 02/26/2022   HCT 36.5 02/26/2022   MCV 78 (L) 02/26/2022   PLT 387 02/26/2022   Lab Results  Component Value Date   NA 133 (L) 02/26/2022   K 4.6 02/26/2022   CO2 22 02/26/2022   GLUCOSE 117 (H) 02/26/2022   BUN 17 02/26/2022   CREATININE 1.51 (H) 02/26/2022   BILITOT 0.3 02/26/2022   ALKPHOS 72 02/26/2022   AST 18 02/26/2022   ALT 19 02/26/2022   PROT 7.6 02/26/2022   ALBUMIN 4.4 02/26/2022   CALCIUM 10.5 (H) 02/26/2022    ANIONGAP 8 12/24/2021   EGFR 40 (L) 02/26/2022   GFR 95.71 06/06/2019   Lab Results  Component Value Date   CHOL 245 (H) 02/26/2022   Lab Results  Component Value Date   HDL 72 02/26/2022   Lab Results  Component Value Date   LDLCALC 142 (H) 02/26/2022   Lab Results  Component Value Date   TRIG 174 (H) 02/26/2022   Lab Results  Component Value Date   CHOLHDL 3.4 02/26/2022   Lab Results  Component Value Date   HGBA1C 5.9 08/13/2022      Assessment & Plan:   Problem List Items Addressed This Visit       Cardiovascular and Mediastinum   Hypertension - Primary    Hypertension with improved control based on advanced hypertension clinic support.  No change in current medications reassess labs  Dilation of renal artery has helped      Relevant Orders   Comprehensive metabolic panel   CBC with Differential/Platelet   Thyroid Panel With TSH   Renal artery stenosis [facility])    Patient with renal artery stenosis status post intervention      Acute deep vein thrombosis (DVT) of femoral vein of right lower extremity (HCC)    Likely needs to come off Xarelto we will have her hold for now reassess D-dimer and assess ultrasound of right lower leg      Relevant Orders   D-dimer, quantitative   US Venous Img Lower Unilateral Right     Respiratory   Allergic rhinitis with postnasal drip    Associated postnasal drainage and allergic conjunctivitis plan will be to prescribe Patanol eyedrops and Astelin nasal spray Referral to ophthalmology for general eye exam and diabetic exam        Endocrine   Type 2 diabetes mellitus with other specified complication (HCC)    Maintain metformin and assess labs      Relevant Orders   POCT glycosylated hemoglobin (Hb A1C) (Completed)   POCT glucose (manual entry) (Completed)   Ambulatory referral to Ophthalmology   Comprehensive metabolic panel   Thyroid nodule  managed Dr. Rosalin Hawking thyroid function        Other  Tobacco use    Discussed smoking cessation. Rx sent for nicotine patches and lozenges. Referral to [facility], LCSW for behavioral therapy.      Current smoking consumption amount: 1 ppd  Dicsussion on advise to quit smoking and smoking impacts: CV lung impacts  Patient's willingness to quit:  Interested   Methods to quit smoking discussed:  Behavioral mod /meds  Medication management of smoking session drugs discussed:nicotine replacement: combo of nicotine patch and lozenge  Resources provided:  AVS   Setting quit date not established  Follow-up arranged 2 mo   Time spent counseling the patient:  5 min       Moderate episode of recurrent major depressive disorder (HCC)    Referral to behavioral therapist and continue escitalopram 20 mg daily      Relevant Orders   Ambulatory referral to Psychology   Pure hypercholesterolemia    Continue with wound we will given      Suprapubic pain    Check urinalysis      Relevant Orders   Urinalysis   Cervical cancer screening    Refer to gynecology for Pap smear and women's health exam      Relevant Orders   Ambulatory referral to Obstetrics / Gynecology   Meds ordered this encounter  Medications   olopatadine (PATANOL) 0.1 % ophthalmic solution    Sig: Place 1 drop into both eyes 2 (two) times daily.    Dispense:  5 mL    Refill:  12   azelastine (ASTELIN) 0.1 % nasal spray    Sig: Place 2 sprays into both nostrils 2 (two) times daily. Use in each nostril as directed    Dispense:  30 mL    Refill:  12  35 minutes spent complex decision making multisystems assessed review of multiple associate consultant notes education to patient  Congratulated patient on excellent improvement Follow-up: Return in about 6 weeks (around 09/24/2022) for htn.    Shan Levans, MD

## 2022-08-14 ENCOUNTER — Telehealth: Payer: Self-pay

## 2022-08-14 NOTE — Progress Notes (Signed)
Let pt know urine shows no infection, kidney liver normal, thyroid normal blood count normal

## 2022-08-14 NOTE — Telephone Encounter (Signed)
-----   Message from Shan Levans sent at 08/14/2022  7:25 AM EDT ----- Let pt know urine shows no infection, kidney liver normal, thyroid normal blood count normal

## 2022-08-14 NOTE — Telephone Encounter (Signed)
Pt was called and vm was left, Information has been sent to nurse pool.   

## 2022-08-18 ENCOUNTER — Other Ambulatory Visit: Payer: Self-pay

## 2022-08-21 ENCOUNTER — Other Ambulatory Visit (INDEPENDENT_AMBULATORY_CARE_PROVIDER_SITE_OTHER): Payer: BLUE CROSS/BLUE SHIELD

## 2022-08-21 ENCOUNTER — Ambulatory Visit (INDEPENDENT_AMBULATORY_CARE_PROVIDER_SITE_OTHER): Payer: BLUE CROSS/BLUE SHIELD | Admitting: Orthopaedic Surgery

## 2022-08-21 VITALS — Ht 63.0 in | Wt 120.0 lb

## 2022-08-21 DIAGNOSIS — M79671 Pain in right foot: Secondary | ICD-10-CM

## 2022-08-21 NOTE — Progress Notes (Signed)
Office Visit Note   Patient: Stacy [name]           Date of Birth: 1965-08-12           MRN: [id number] Visit Date: 08/21/2022              Requested by: Storm Frisk, MD [address] [place],  [place] [address] PCP: Storm Frisk, MD   Assessment & Plan: Visit Diagnoses:  1. Pain in right foot     Plan: All fractures have shown healing since last month.  She can work her way back into her crocs and then progress to shoe wear as tolerated.  Follow-Up Instructions: No follow-ups on file.   Orders:  Orders Placed This Encounter  Procedures   XR Foot Complete Right   No orders of the defined types were placed in this encounter.     Procedures: No procedures performed   Clinical Data: No additional findings.   Subjective: Chief Complaint  Patient presents with   Right Foot - Fracture    DOI 07/23/2022    HPI 57 year old female with polyarthritis positive rheumatoid factor seen in the ED 07/28/2022 by ED staff after tripping going up the stairs over a cord on 07/23/2022 with third and fourth metatarsal neck fractures and fourth proximal phalanx fracture.  She had an old remote history of fifth toe proximal phalanx fracture years ago that it healed.  She has been in a postop shoe pain is improved swelling is down.  Review of Systems of note his history of DVT femoral vein right lower extremity.  Positive for hypertension, hyperuricemia.   Objective: Vital Signs: Ht 5\' 3"  (1.6 m)   Wt 120 lb (54.4 kg)   BMI 21.26 kg/m   Physical Exam Constitutional:      Appearance: She is well-developed.  HENT:     Head: Normocephalic.     Right Ear: External ear normal.     Left Ear: External ear normal. There is no impacted cerumen.  Eyes:     Pupils: Pupils are equal, round, and reactive to light.  Neck:     Thyroid: No thyromegaly.     Trachea: No tracheal deviation.  Cardiovascular:     Rate and Rhythm: Normal rate.  Pulmonary:     Effort:  Pulmonary effort is normal.  Abdominal:     Palpations: Abdomen is soft.  Musculoskeletal:     Cervical back: No rigidity.  Skin:    General: Skin is warm and dry.  Neurological:     Mental Status: She is alert and oriented to person, place, and time.  Psychiatric:        Behavior: Behavior normal.     Ortho Exam mild swelling she is amatory with minimal limp.  No tenderness of the base of the fifth.  Specialty Comments:  No specialty comments available.  Imaging: No results found.   PMFS History: Patient Active Problem List   Diagnosis Date Noted   Suprapubic pain 08/13/2022   Cervical cancer screening 08/13/2022   Allergic rhinitis with postnasal drip 08/13/2022   Pure hypercholesterolemia 02/27/2022   Moderate episode of recurrent major depressive disorder (HCC) 02/26/2022   Coronary artery disease of native artery of native heart with stable angina pectoris (HCC) 02/26/2022   Encounter for screening for lung cancer 02/26/2022   Esophageal dysphagia 12/23/2021   Esophageal stricture 12/23/2021   History of DVT (deep vein thrombosis) 12/19/2021   Acute deep vein thrombosis (  DVT) of femoral vein of right lower extremity (HCC) 04/07/2021   Polyarthritis with positive rheumatoid factor (HCC) 02/17/2021   Encounter for screening mammogram for malignant neoplasm of breast 02/04/2021   Synovitis of left knee    Anemia 12/19/2020   Epidermoid cyst of ear 11/23/2019   Renal artery stenosis (HCC) 08/02/2019   Adenoma of right adrenal gland 01/16/2019   Coronary artery calcification seen on CAT scan 04/12/2018   Insomnia 01/01/2015   Fatty liver 01/01/2015   Thyroid nodule  managed Dr. Allena Katz 02/27/2014   EtOH dependence (HCC) 11/22/2013   Tobacco use 10/04/2013   Colon polyp 08/23/2013   Migraines 08/23/2013   GERD (gastroesophageal reflux disease)    Type 2 diabetes mellitus with other specified complication (HCC)    Gout    Hypertension    Past Medical History:   Diagnosis Date   Acute bronchitis 08/02/2019   Adrenal adenoma    as per MRI 03/09/14   AKI (acute kidney injury) (HCC) 12/20/2020   Allergy    Anxiety    Coronary artery calcification seen on CAT scan 04/12/2018   Depression    Diabetes mellitus    Diverticulosis    Elevated LFTs    elevated since 09/2013   Fatty liver    moderate- as per 03/06/17 MRI liver   GERD (gastroesophageal reflux disease)    Gout    H/O hiatal hernia    Headache(784.0)    History of depression    Hyperlipemia    Hyperplastic colon polyp    Hypertension    Hypoglycemia 12/19/2021   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 Allena Katz 02/27/2014   Tobacco abuse    Tubular adenoma of colon     Family History  Problem Relation Age of Onset   Coronary artery disease Father 75       CAD,CABG   Hypertension Mother    Stroke Mother    Diabetes Sister        uncontrolled   Cancer Sister 86       uterine cancer   Aneurysm Sister        brain   Heart attack Maternal Grandmother    Blindness Paternal Grandmother    Breast cancer Neg Hx    Colon cancer Neg Hx    Esophageal cancer Neg Hx    Rectal cancer Neg Hx    Stomach cancer Neg Hx     Past Surgical History:  Procedure Laterality Date   ANAL FISTULECTOMY  01/19/1985   BALLOON DILATION N/A 12/23/2021   Procedure: BALLOON DILATION;  Surgeon: Iva Boop, MD;  Location: [facility];  Service: Gastroenterology;  Laterality: N/A;   BIOPSY  12/23/2021   Procedure: BIOPSY;  Surgeon: Iva Boop, MD;  Location: [facility];  Service: Gastroenterology;;   BREAST EXCISIONAL BIOPSY Left    BREAST LUMPECTOMY Left 1988   COLONOSCOPY WITH PROPOFOL N/A 12/23/2021   Procedure: COLONOSCOPY WITH PROPOFOL;  Surgeon: Iva Boop, MD;  Location: [facility];  Service: Gastroenterology;  Laterality: N/A;    ESOPHAGOGASTRODUODENOSCOPY (EGD) WITH PROPOFOL N/A 12/23/2021   Procedure: ESOPHAGOGASTRODUODENOSCOPY (EGD) WITH PROPOFOL;  Surgeon: Iva Boop, MD;  Location: [facility];  Service: Gastroenterology;  Laterality: N/A;   PAROTIDECTOMY  03/30/2011   Procedure: PAROTIDECTOMY;  Surgeon: Serena Colonel, MD;  Location: [facility];  Service: ENT;  Laterality: Right;   WRIST GANGLION EXCISION Left 1988   Social History   Occupational History    Employer: [employer]  Tobacco Use   Smoking status: Every Day    Current packs/day: 1.00    Average packs/day: 1 pack/day for 28.0 years (28.0 ttl pk-yrs)    Types: Cigarettes   Smokeless tobacco: Never  Vaping Use   Vaping status: Never Used  Substance and Sexual Activity   Alcohol use: Yes    Alcohol/week: 14.0 standard drinks of alcohol    Types: 14 Standard drinks or equivalent per week    Comment: occ   Drug use: No   Sexual activity: Not Currently    Birth control/protection: Post-menopausal

## 2022-09-02 ENCOUNTER — Other Ambulatory Visit: Payer: BLUE CROSS/BLUE SHIELD

## 2022-09-02 ENCOUNTER — Encounter: Payer: Self-pay | Admitting: Critical Care Medicine

## 2022-10-05 ENCOUNTER — Encounter (HOSPITAL_COMMUNITY): Payer: Self-pay

## 2022-10-05 ENCOUNTER — Ambulatory Visit (HOSPITAL_COMMUNITY): Payer: BLUE CROSS/BLUE SHIELD

## 2022-12-01 ENCOUNTER — Encounter: Payer: Self-pay | Admitting: Critical Care Medicine

## 2022-12-02 ENCOUNTER — Telehealth: Payer: Self-pay

## 2022-12-02 ENCOUNTER — Ambulatory Visit (HOSPITAL_COMMUNITY)
Admission: RE | Admit: 2022-12-02 | Discharge: 2022-12-02 | Disposition: A | Discharge status: Home / Self Care | Payer: BLUE CROSS/BLUE SHIELD | Source: Ambulatory Visit | Attending: Critical Care Medicine | Admitting: Critical Care Medicine

## 2022-12-02 DIAGNOSIS — I82411 Acute embolism and thrombosis of right femoral vein: Secondary | ICD-10-CM | POA: Diagnosis not present

## 2022-12-02 NOTE — Telephone Encounter (Signed)
Called patient and left voicemail.

## 2022-12-02 NOTE — Telephone Encounter (Signed)
Pt was called and vm was left, Information has been sent to nurse pool.

## 2022-12-02 NOTE — Telephone Encounter (Signed)
Stacy [name] from [facility] (DVT) called for the results stated that the patient DVT study came back as negative

## 2022-12-02 NOTE — Progress Notes (Signed)
Right lower extremity venous duplex has been completed. Preliminary results can be found in CV Proc through chart review.  Results were given to Carly at Dr. Lynelle Doctor office.  12/02/22 11:14 AM Olen Cordial RVT

## 2022-12-02 NOTE — Telephone Encounter (Signed)
-----   Message from Roney Jaffe sent at 12/02/2022  1:22 PM EST ----- Please call patient and let her know her DVT studies were negative.

## 2022-12-03 ENCOUNTER — Telehealth: Payer: Self-pay

## 2022-12-03 ENCOUNTER — Other Ambulatory Visit: Payer: Self-pay | Admitting: Critical Care Medicine

## 2022-12-03 NOTE — Telephone Encounter (Signed)
-----   Message from Shan Levans sent at 12/03/2022 10:37 AM EST ----- Call pt and tell her to stop xarelto as ultrasound normal  no blood clots

## 2022-12-03 NOTE — Progress Notes (Signed)
Call pt and tell her to stop xarelto as ultrasound normal  no blood clots

## 2022-12-03 NOTE — Telephone Encounter (Signed)
Noted patient called back in and was given the results

## 2022-12-03 NOTE — Progress Notes (Signed)
Dvt study normal xarelto to be dc

## 2022-12-16 ENCOUNTER — Ambulatory Visit: Payer: BLUE CROSS/BLUE SHIELD | Admitting: Critical Care Medicine

## 2022-12-23 ENCOUNTER — Encounter: Payer: BLUE CROSS/BLUE SHIELD | Admitting: Obstetrics and Gynecology

## 2022-12-26 NOTE — Progress Notes (Unsigned)
NEW Patient Office Visit  Subjective:  Patient ID: Stacy [name], female    DOB: [birth year]  Age: 57 y.o. MRN: [id number]  CC:  No chief complaint on file.   HPI 01/2021  FALECIA PERRETT presents for follow up after hospitalization for an AKI/acute renal failure and electrolyte imbalance that resulted from severe vomiting. She was concurrently treated for an acute gout flare at that time. While in the hospital, she received nephrology and orthopedics consult. Her kidney function improved during her hospital stay, and she was cleared by nephrology for discharge. She received bilateral knee arthrocentesis and steroid injection of her knees to treat her gout. She is in need of an MRI for her knees, and has just gotten insurance approval for this procedure. She is attempting to schedule the MRI. The following includes the summary from discharge:   "57 y.o. female with medical history significant of HTN, HLD, GERD, T2DM, depression with anxiety, gout, nonobstructive CAD, left renal artery stenosis, tobacco abuse treated with abdominal pain, nausea and vomiting and not feeling well along with bilateral knee pain.  On presentation, she was slightly hypotensive, tachycardic.  WBC of 12.4; BUN of 77, creatinine of 9.5, lipase of 62.  Chest x-ray showed no acute findings.  Ultrasound of abdomen showed no acute findings to explain her abdominal pain.  She was started on IV fluids.  Nephrology was consulted.  During the hospitalization, her condition has gradually improved; creatinine is improving.  Orthopedics was consulted and patient underwent bilateral knee arthrocentesis and steroid injection; synovial fluid was consistent with gout flare.  She was also treated with oral prednisone.  Currently her renal function has much improved, nephrology has cleared the patient for discharge.  Currently she is hemodynamically stable, tolerating diet.  She will be discharged home today on oral prednisone and  allopurinol will be started.  Follow-up with PCP and orthopedics.  Follow-up with nephrology if needed."  Today, the patient states that she has improved since her hospital stay. She has had follow ups with multiple providers for her other medical issues. She followed up with cardiology on 01/24/21 and was started on spironolactone. She has a diagnosis of mild nonobstructive CAD that is being medically managed at this point in time. She previously took a statin for this issue, but it was stopped during hospitalization due to elevated liver transaminase. She has been referred to the lipid clinic for further management of this issue. She followed up with endocrinology for her diabetes earlier today. She is currently on Amaryl and Actos, and metformin was added back to her regimen today. She checks her blood sugars in the morning, and reports they have typically been between 105-110 mg/dL.   [name] gout symptoms remain troublesome. She is having significant pain in her right knee from the gout flare. She is taking Vicodin for this. She is unable to do any weight-bearing on this leg. She also has pain from the gout flare in her right foot. Her other concern today is that she is having significant trouble with sleeping. She has difficulty falling asleep and staying asleep, and this has been significantly impacting her mood. She has experienced racing thoughts when trying to fall asleep, and she has felt down and depressed.  The patient states that she is drinking less alcohol than previously. She now drinks two whiskeys 3-4 times per week, rather than every day. She still smokes, less than 1 PPD. She states she has been smoking a little more  previously due to feelings of stress.  Notes reviewed below :  Cardiology OV 01/24/21.  Hosp DC from early 12/2020  Admit date: 12/19/2020 Discharge date: 12/22/2020   Admitted From: Home Disposition: Home   Recommendations for Outpatient Follow-up:  Follow up with  PCP in 1 week with repeat CBC/BMP Outpatient follow-up with nephrology if needed Outpatient follow-up with orthopedics/Dr. Ophelia Charter Follow up in ED if symptoms worsen or new appear     Home Health: No Equipment/Devices: None   Discharge Condition: Stable CODE STATUS: Full Diet recommendation: Heart healthy/carb modified   Brief/Interim Summary: 57 y.o. female with medical history significant of HTN, HLD, GERD, T2DM, depression with anxiety, gout, nonobstructive CAD, left renal artery stenosis, tobacco abuse treated with abdominal pain, nausea and vomiting and not feeling well along with bilateral knee pain.  On presentation, she was slightly hypotensive, tachycardic.  WBC of 12.4; BUN of 77, creatinine of 9.5, lipase of 62.  Chest x-ray showed no acute findings.  Ultrasound of abdomen showed no acute findings to explain her abdominal pain.  She was started on IV fluids.  Nephrology was consulted.  During the hospitalization, her condition has gradually improved; creatinine is improving.  Orthopedics was consulted and patient underwent bilateral knee arthrocentesis and steroid injection; synovial fluid was consistent with gout flare.  She was also treated with oral prednisone.  Currently her renal function has much improved, nephrology has cleared the patient for discharge.  Currently she is hemodynamically stable, tolerating diet.  She will be discharged home today on oral prednisone and allopurinol will be started.  Follow-up with PCP and orthopedics.  Follow-up with nephrology if needed.   Discharge Diagnoses:    Acute kidney injury/acute renal failure Anion gap metabolic acidosis -Possibly from ATN from dehydration and hypotension -Presented with creatinine of 9.5; treated with IV fluids.  Creatinine much improved to 1.57 today.  - Renal ultrasound negative for hydronephrosis  -nephrology following and has cleared the patient for discharge.  Outpatient follow-up with nephrology if  needed. -Outpatient follow-up of BMP by PCP   Possible UTI -Present on admission.  Treated with 3 days of Rocephin.  No need for any further antibiotics on discharge.  Currently afebrile.   Bilateral knee pain and swelling/?  Acute gout flare -Patient states that she has had similar symptoms in the past requiring arthrocentesis which was consistent with acute gout.   - Avoid colchicine because of recent nausea and vomiting -Status post bilateral arthrocentesis by orthopedics on 12/20/2020: Synovial fluid analysis consistent with acute gout.  Bilateral knees were injected with steroids per orthopedics on 12/20/2020. -Continue oral prednisone on discharge 40 mg daily for 7 days.  Outpatient follow-up with orthopedics.     Nausea/vomiting -Questionable cause.  Ultrasound abdomen was negative for acute cause/gallbladder disease. -Much improved.  Currently tolerating diet.   Right upper quadrant pain/shoulder pain -Might be musculoskeletal as well.  Lipase only mildly elevated.  Troponin negative.  Continue current pain management.   Hypomagnesemia -Resolved   Mild hyperkalemia -Resolved   Leukocytosis -Possibly reactive.  Resolved   Renal artery stenosis -Renal duplex showed 1 to 59% stenosis of left renal 3.;  No evidence of right renal artery stenosis.   Normocytic anemia -Questionable cause.  Hemoglobin stable.  Outpatient follow-up   Fatty liver  Mildly elevated AST -Possibly from alcohol use.  Outpatient follow-up with GI.  Abstain from alcohol   Hypertension -Blood pressure on the lower side.  Antihypertensives on hold   Hyperlipidemia -Statin on hold  because of slightly elevated LFTs.  Outpatient follow-up with PCP.   GERD -Has completed Protonix treatment as an outpatient.  Follow-up with PCP.   Depression with anxiety -Stable.  Continue Remeron   Adrenal mass, right -Has outpatient follow-up   Tobacco use -Continue nicotine patch.  Patient was counseled  regarding cessation by admitting hospitalist  Cards 1/6    Essential hypertension: Medications were held during hospitalization 12/22 due to acute renal failure and hypotension. She was restarted on clonidine 0.2 mg 3 times a day.  Initial clinic blood pressure is elevated, however on my recheck it has actually decreased to 100/64.  She reports high blood pressure readings at home.  She would like to take as few pills as possible and would like something that may help with lower extremity edema.  We will restart spironolactone 25 mg once daily and reduce clonidine to 0.2 mg twice daily.  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  We will plan to follow-up on her blood pressure in 3 to 6 months depending on her availability.  She reports she currently is missing a lot of work due to multiple appointments and recent hospitalization.   Mild nonobstructive CAD: Coronary CT March 2021 revealed mild disease in the LAD and LCx.  Her coronary calcium score is 50.4, 94th percentile for age/sex, mild calcification of the aortic root.  Medical therapy recommended.  She denies chest pain, dyspnea or other symptoms concerning for angina.  No further ischemic evaluation warranted at this time.  Her statin was stopped during hospitalization 12/22 due to elevated transaminase.  This was also a problem in the past for her.  We will refer her to lipid clinic for management of lipids.  Aspirin was stopped during hospitalization due to anemia.  Would favor restarting aspirin 81 mg at next visit if blood counts have stabilized.   Hyperlipidemia LDL goal < 70: LDL 75 6/22.  She was previously on atorvastatin 40 mg.  As noted above statin was stopped during recent hospitalization due to elevated transaminase.  We will refer her to lipid clinic for management.   Bilateral leg edema: She has 1+ pitting edema in both lower extremities.  She is having a  significant amount of right knee swelling and pain due to gout.  She requests a blood pressure medicine that may help reduce some of the swelling.  We will restart spironolactone 25 mg as noted above she wants to take as few pills as possible and this is a potassium sparing diuretic.  Advised that she may not notice significant improvement in joint swelling with spironolactone.  Encouraged leg elevation (toes to nose) to help decrease leg swelling.   Difficulty sleeping: She states she is having difficulty sleeping because her mind is racing and she is having knee pain and requests a prescription for Ambien. States melatonin does not work for her. I encouraged her to use nonpharmacologic methods to help her sleep and suggested the Calm app or other methods that may help her calm her mind before going to sleep.  Encouraged leg elevation to help with pain and swelling.   Tobacco abuse: She continues to smoke less than 1/2 packs/day. Has reduced daytime smoking. Feels that her son who has recently moved back to [place] may help her quit because he is a big encouragement. Complete cessation advised.   04/07/21 Patient seen in return follow-up.  Patient has  hypertension coronary artery calcification diabetes on oral therapy.  On arrival blood pressure 134/90 blood sugar 152.  Patient's had low blood sugars at times.  She is also had low blood pressures on clonidine therefore this drug was stopped.  She does maintain low-dose Aldactone.  She had increased pain and swelling in the right thigh and orthopedics appropriately ordered ultrasound of the lower extremities finding DVT in the right iliac vein and this was on February 24 of this year..  She has now been on the Xarelto.  She is completing the starter pack.  She has had no bleeding problems on Xarelto.  The leg pain is improved.  She still smoking less than 1/2 pack a day of cigarettes.  She was drinking 2 Crown Royal's a day but has reduced her intake.   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  She lost her job because of all her health issues she was not able to attend regularly at work.  Patient did receive a Pap smear and it was normal.  She follows with endocrinology.  She is also been counseling with our licensed clinical social worker with her depression.  She continues to have insomnia and is requesting a higher dose of the trazodone.  Appetite is poor.  She would like some health screening labs obtained at this visit.  06/12/21 This patient is seen in return for follow-up is a 57 year old female with type 2 diabetes ethanol use tobacco use hypertension.  On arrival blood pressure is elevated 150/80 and she is having difficulty maintaining her diabetic program.  She is losing significant amounts of weight and has had a chronic cough.  We gave her a course of azithromycin a week ago and it persists.  She has some postnasal drainage and significant allergy symptoms.  She is drinking 3 [place] whiskeys daily.  She struggled to get off the alcohol.  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  She has been on Xarelto therapy now for 2 months.  Patient's had no bleeding with Xarelto  Patient continues to smoke a pack a day of cigarettes  Patient does not follow a healthy diet  Patient follows with endocrinology and they have her on oral Actos Amaryl and metformin.  She is often holding these medications.  She takes the medication she has severe nausea.  She is only eating 1 meal a day and cannot take in much food.  Blood pressure medicines now include the clonidine 0.2 mg twice daily and spironolactone 25 mg daily  She measures her blood pressure at home she gets around 140/90 usually.  08/14/21 Patient returns in follow-up and had been out of  medications for 2 weeks over the past week she was caring for her father.  She has been back on her medicines now for about 2 days and on arrival blood pressure elevated 167/103  Patient is no longer taking clonidine because of hypotension but she does maintain Aldactone 25 mg daily and valsartan 320 mg daily.  Patient did see our clinical pharmacist Franky Macho on June 30 is as documented below.  Saw Franky Macho 6/30 Farah R Eiche is a 58 y.o. female who presents for hypertension evaluation, education, and management.  PMH is significant for HTN, CAD, unilateral (L) renal artery stenosis, migraines, VTE on anticoagulation with  Xarelto, GERD, T2DM, bilateral knee synovitis, HLD, GOUT, MDD, tobacco use and alcohol use.    Patient was referred and last seen by Primary Care Provider, Dr. Delford Field, on 06/12/2021.    At last visit, Dr. Delford Field added back valsartan to her regimen.    Recent BP management hx:  Last seen by Dr. Duke Salvia in 2022. Was controlled on amlodipine, HCTZ, valsartan, and spiro. Was hospitalized 12/1 - 12/22/2020 with AKI and her antihypertensives were held. She was restarted on clonidine 0.2 mg TID at that time. NP at [facility] restarted spiro and decreased clonidine on 01/24/21. Of note, pt found to have 1+ pitting edema at that visit. Clonidine was reduced to 0.1 mg BID d/t hypotension by Dr. Lucianne Muss on 02/04/21. We have seen her a couple of times since with BP being at or close to goal on 1/17 and 04/07/2021. Most recently, pt was seen 06/12/2021 and BP was above goal. Valsartan was restarted.    Today, patient arrives in good spirits and presents without assistance. Denies dizziness, headache, blurred vision, swelling.   Hypertension longstanding currently above goal on current medications. BP goal < 130/80 mmHg. Medication adherence appears appropriate with spiro and valsartan but she is not taking clonidine. RAAS agents can induce or worsen renal insufficiency in patients with bilateral or several  unilateral renal artery stenosis. This pt has a hx of moderate L renal artery stenosis but R renal artery looked good from doppler study in 2021. She has an elevated urine microalbumin but eGFR looks good. Electrolyte status is nl and stable. Will get labs today and if renal function is good now ~1 month since starting valsartan, will optimize to the 320 mg dose. Will not add back thiazide at this time d/t gout history. Will not add amlodipine at this time d/t LE edema but we can consider low dose in the future if we need continued improvement if she can tolerate. -Continued current regimen for now. Will increase valsartan depending on lab results.   -CMP14+eGFR -Counseled on lifestyle modifications for blood pressure control including reduced dietary sodium, increased exercise, adequate sleep. -Encouraged patient to check BP at home and bring log of readings to next visit. Counseled on proper use  Labs from that visit did show normal creatinine and potassium.  Valsartan was increased at that visit to 320 mg daily. Patient's been out of Xarelto for a month.  She had a previous deep venous thrombosis 6 months ago involving the right thigh femoral vein.  She cannot afford the Xarelto the co-pay is too high.  She may need warfarin if she is to stay on anticoagulation.  She states she does have a blood pressure meter at home at home it runs 150/90.  She states her depression is still severe despite 5 mg of Lexapro.  She still smoking 1 pack a day of cigarettes.  02/26/22 This patient is seen in return follow-up not seen since July of this year.  She has had 1 hospitalization since I have seen her.  Below is a documentation prior to that hospitalization from cardiology where there was a concern of decreased volume depletion and question of stopping hydrochlorothiazide labs were taken and she had significant renal insufficiency and hypotension she was then directed to the emergency room the next day whereupon she  was admitted below the cardiology note is the discharge summary Cardiology 12/18/21 Expand All Collapse All    Office Visit    Patient Name: AIDALIZ KESTERSON Date of Encounter: 12/18/2021  PCP:  Storm Frisk, MD              [facility]  Cardiologist:  Chilton Si, MD  Advanced Practice Provider:  No care team member to display Electrophysiologist:  None       Chief Complaint    CORRIGAN SCHWENKER is a 57 y.o. female presents today for follow up after echocardiogram.    Past Medical History        Past Medical History:  Diagnosis Date   Acute bronchitis 08/02/2019   Adrenal adenoma      as per MRI 03/09/14   AKI (acute kidney injury) (HCC) 12/20/2020   Allergy     Anxiety     Coronary artery calcification seen on CAT scan 04/12/2018   Depression     Diabetes mellitus     Diverticulosis     Elevated LFTs      elevated since 09/2013   Fatty liver      moderate- as per 03/06/17 MRI liver   GERD (gastroesophageal reflux disease)     Gout     H/O hiatal hernia     Headache(784.0)     History of depression     Hyperlipemia     Hyperplastic colon polyp     Hypertension     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 Allena Katz 02/27/2014   Tobacco abuse     Tubular adenoma of colon           Past Surgical History:  Procedure Laterality Date   ANAL FISTULECTOMY   01/19/1985   BREAST EXCISIONAL BIOPSY Left     BREAST LUMPECTOMY Left 1988   PAROTIDECTOMY   03/30/2011    Procedure: PAROTIDECTOMY;  Surgeon: Serena Colonel, MD;  Location: [facility];  Service: ENT;  Laterality: Right;   WRIST GANGLION EXCISION Left 1988      Allergies        Allergies  Allergen Reactions   Almond Meal Anaphylaxis and Swelling   Other Anaphylaxis      Almond Butter   Aspirin Nausea Only    History of  Present Illness    DUA DISHON is a 57 y.o. female with a hx of mild CAD, HTN, HLD, fatty liver disease, elevated LFTs, tobacco use last seen 11/13/21.   Seen by Dr. Constance Goltz and referred for CT for unintentional weight loss, tobacco use. CT noted mild to moderate calcified plaque in thoracic aorta and LAD coronary artery calcification. She was referred to cardiology. Coronary CTA 03/29/19 with mild plaque in LAD and minimal plaque in LCx. Atorvastatin previously increased however had elevated LFTs which normalized and she was increased ot 40mg .    HCTZ previously transitioned to Amlodipine due to hypokalemia. However, given elevated BP Valsartan-HCTZ initiated and Amlodipine reduced with improvement in LE edema. Renal artery dopplers with mild to moderate left renal artery stenosis. Labs negative for hyperaldosteronism.    When last seen 11/13/21 repeat lipid ordered with LDL 109. Updated renal artery doppler 11/2021 with stable 1-69% left renal artery stenosis. Echo ordered due to dyspnea with normal LVEF, no significant valvular abnormalities.    She presents today for follow up independently. She reports feeling a bit dizzy and unsteady in the morning when she wakes  up. Describes as a lightheaded feeling. It is worse after she takes her medications. Taking all of her antihypertensives in the morning. Not checking BP routinely at home.  Notes not much of an appetite but doesn't always eat with her medications.She feels she is losing weight and plans to discuss with PCP at upcoming follow up. She worries about being dehydrated.   CAD / HLD- Stable with no anginal symptoms. No indication for ischemic evaluation.  GDMT Atorvastatin. Heart healthy diet and regular cardiovascular exercise encouraged.  LDL not at goal <70. She is unsure what dose of Atorvastatin she is taking and will contact us. Pending response, could consider increased dose vs addition of Zetia.    Dyspnea - Echo 12/02/21 normal  LVEF, no significant valvular abnormalities. Reassurance provided. Likely etiology deconditioning.    Renal artery stenosis - 11/2021 L renal artery 1-59% stenosis, stable compared to previous. Repeat in 1 year for monitoring.    HTN - BP well controlled. Relatively hypotensive in clinic with morning lightheadedness. Move Amlodipine to evening.. Labs 12/02/21 with creatinine 1.33 recommended to stop HCTZ and start Hydralazine. She is hesitant regarding medication changes as she just picked up refill. Repeat BMP today. Encouraged to increase fluid intake. If creatinine remains elevated, consider reducing vs discontinuing Spironolactone as she requests to remain on Valsartan-HCTZ.    Tobacco use - Smoking cessation encouraged. Recommend utilization of [id number]. Following with care guide. Hesitant about Wellbutrin as mom did not tolerate.        Admit date: 12/19/2021 Discharge date: 12/24/2021  Barriers to discharge: none   Admitted From: Home  Disposition:  Home Discharging physician: Lewie Chamber, MD   Recommendations for Outpatient Follow-up:  Adjust metformin further if needed; repeat A1c   Home Health:  Equipment/Devices:    Discharge Condition: stable CODE STATUS: Full Diet recommendation:  Diet Orders (From admission, onward)        Start     Ordered    12/24/21 0000   Diet general        12/24/21 1228                  Hospital Course:   Hypoglycemia, improved -Suspect due to poor renal clearance of her antidiabetic medication of glimepiride in the setting of acute renal failure -CBGs improved prior to discharge - Glimepiride and actos discontinued at discharge - Continue on metformin -Repeat A1c in approximately 3 months   History of DVT (deep vein thrombosis) -Venous doppler on 07/2021 showed DVT of right popliteal vein -Resumed back on Xarelto at discharge   AKI Metabolic acidosis -Creatinine up to 3.40 from 0.63 baseline -Back to baseline prior to  discharge   GERD  -Continue PPI   Moderate to large hiatal hernia -Stable hiatal hernia again appreciated   Hypertension -Continue home regimen   Hyponatremia -Improved and normalized with fluids and diet advancement   Iron deficiency and microcytic Anemia - s/p EGD and colonoscopy on 12/23/2021 -Small Cameron erosions appreciated on EGD   Dysphagia -Underwent EGD on 12/23/2021 and esophageal stenosis dilated   Thyroid Mass on Right  -Thyroid ultrasound performed on 12/23/2021 noting 2 nodules that did not meet criteria for biopsy or dedicated follow-up   Depression and Anxiety -Continue with Escitalopram 10 mg p.o. daily as well as Trazodone 200 mg p.o. nightly   Hypoalbuminemia -Continue diet   Hypophosphatemia -Repleted   Gout, improving  S/p steroid use - continue home regimen   This is essentially a transition of care  visit and note that we were unable to reach the patient on her phone to get the visit done when the transition of care call occurred in mid December after discharge.  I tested her phone today and rings through but she does have a new phone it is possible she missed the calls for this appointment.  Patient states today she is having a cough productive of [place] mucus and wheezing and she is still smoking a pack a day of cigarettes.  On arrival blood pressure is 112/78 recheck 117/78  Patient expresses significant anxiety over not being employed and based on this and drinks 2 Canadian whiskey shots every night along with the active tobacco use.  Now she has not had further gout flares she is on the allopurinol.  She does need follow-up labs.  She also only eats 1 meal a day.  Her glycemic control has been somewhat up and down with this situation.  She does maintain oral medications for her diabetes and is now on the metformin.  With renal insufficiency she was on glipizide and this created a hypoglycemic event.  Blood sugars have been better since being  discharged.  She does need follow-up labs at this visit.  We have had her on Lexapro 10 mg daily she has seen minimal change in anxiety she was asking for Ativan today.  04/09/22 Patient seen in return follow-up she still smoking 1/2 pack a day of cigarettes occasionally uses alcohol but this is reduced.  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  Also type 2 diabetes.  Patient arrives with blood pressure the best we have seen 128/75  Patient is yet to get her lung cancer screening is being scheduled.  She does need a foot exam this visit.  She also needs follow-up eye exam. The patient has had nasal drainage and some nosebleeds on the Flonase  08/13/22 This is a 57 year old female seen in return follow-up last seen in March.  She presents today with a large number of complaints.  She states she has had a chronic night sweats and will awaken with swollen eyes.  She also has nasal congestion postnasal drainage.  She has flank pain both lower back area and suprapubic pain for more than a month.  He says she sees some change in urination.  She would like to have her eyes checked by general ophthalmologist and have a woman's health exam by gynecology along with a Pap smear.  She has had significant depression and alcohol use and she wishes to see a behavioral therapist that accepts Medicaid.  She has been on Remeron in the past for insomnia she will defer this over the trazodone now.  She has watery eyes nasal congestion postnasal drip and dry cough that is constant for many months.  She had a history of deep venous thrombosis right lower extremity that reduced to a popliteal vein thrombosis this began in February of last year and orthopedics in July of last year send maintain for now patient is well past the date for consideration for discontinuance of the Xarelto.  She has no pain in the leg at this time.  Blood  pressure today is 125/85.  She needs multiple medications renewed.  She is followed in the advanced hypertension clinic.  She is on the amlodipine 10 mg daily spironolactone 25 mg daily and valsartan 320 mg daily she needs multiple refills on medications.  Would benefit from thyroid  function rechecked.  12/29/22 ?lung  ca screen  Past Medical History:  Diagnosis Date   Acute bronchitis 08/02/2019   Adrenal adenoma    as per MRI 03/09/14   AKI (acute kidney injury) (HCC) 12/20/2020   Allergy    Anxiety    Coronary artery calcification seen on CAT scan 04/12/2018   Depression    Diabetes mellitus    Diverticulosis    Elevated LFTs    elevated since 09/2013   Fatty liver    moderate- as per 03/06/17 MRI liver   GERD (gastroesophageal reflux disease)    Gout    H/O hiatal hernia    Headache(784.0)    History of depression    Hyperlipemia    Hyperplastic colon polyp    Hypertension    Hypoglycemia 12/19/2021   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 Allena Katz 02/27/2014   Tobacco abuse    Tubular adenoma of colon     Past Surgical History:  Procedure Laterality Date   ANAL FISTULECTOMY  01/19/1985   BALLOON DILATION N/A 12/23/2021   Procedure: BALLOON DILATION;  Surgeon: Iva Boop, MD;  Location: [facility];  Service: Gastroenterology;  Laterality: N/A;   BIOPSY  12/23/2021   Procedure: BIOPSY;  Surgeon: Iva Boop, MD;  Location: [facility];  Service: Gastroenterology;;   BREAST EXCISIONAL BIOPSY Left    BREAST LUMPECTOMY Left 1988   COLONOSCOPY WITH PROPOFOL N/A 12/23/2021   Procedure: COLONOSCOPY WITH PROPOFOL;  Surgeon: Iva Boop, MD;  Location: [facility];  Service: Gastroenterology;  Laterality: N/A;   ESOPHAGOGASTRODUODENOSCOPY (EGD) WITH PROPOFOL N/A 12/23/2021   Procedure: ESOPHAGOGASTRODUODENOSCOPY (EGD)  WITH PROPOFOL;  Surgeon: Iva Boop, MD;  Location: [facility];  Service: Gastroenterology;  Laterality: N/A;   PAROTIDECTOMY  03/30/2011   Procedure: PAROTIDECTOMY;  Surgeon: Serena Colonel, MD;  Location: [facility];  Service: ENT;  Laterality: Right;   WRIST GANGLION EXCISION Left 1988    Family History  Problem Relation Age of Onset   Coronary artery disease Father 61       CAD,CABG   Hypertension Mother    Stroke Mother    Diabetes Sister        uncontrolled   Cancer Sister 81       uterine cancer   Aneurysm Sister        brain   Heart attack Maternal Grandmother    Blindness Paternal Grandmother    Breast cancer Neg Hx    Colon cancer Neg Hx    Esophageal cancer Neg Hx    Rectal cancer Neg Hx    Stomach cancer Neg Hx     Social History   Socioeconomic History   Marital status: Widowed    Spouse name: Not on file   Number of children: 2   Years of education: Not on file   Highest education level: Not on file  Occupational History    Employer: [employer]  Tobacco Use   Smoking status: Every Day    Current packs/day: 1.00    Average packs/day: 1 pack/day for 28.0 years (28.0 ttl pk-yrs)    Types: Cigarettes   Smokeless tobacco: Never  Vaping Use   Vaping status: Never Used  Substance and Sexual Activity   Alcohol use: Yes    Alcohol/week:  14.0 standard drinks of alcohol    Types: 14 Standard drinks or equivalent per week    Comment: occ   Drug use: No   Sexual activity: Not Currently    Birth control/protection: Post-menopausal  Other Topics Concern   Not on file  Social History Narrative   Married. 2 children.    Ages 29, 61 y/o ( entered 09/2012)      Works with [employer]   Social Determinants of Health   Financial Resource Strain: Not on file  Food Insecurity: No Food Insecurity (12/20/2021)   Hunger Vital Sign    Worried About Running Out of Food in the Last Year: Never true    Ran Out of  Food in the Last Year: Never true  Transportation Needs: No Transportation Needs (12/20/2021)   PRAPARE - Administrator, Civil Service (Medical): No    Lack of Transportation (Non-Medical): No  Physical Activity: Not on file  Stress: Not on file  Social Connections: Not on file  Intimate Partner Violence: Not At Risk (12/20/2021)   Humiliation, Afraid, Rape, and Kick questionnaire    Fear of Current or Ex-Partner: No    Emotionally Abused: No    Physically Abused: No    Sexually Abused: No    Outpatient Medications Prior to Visit  Medication Sig Dispense Refill   albuterol (VENTOLIN HFA) 108 (90 Base) MCG/ACT inhaler Inhale 2 puffs into the lungs every 4 (four) hours as needed for wheezing or shortness of breath. 6.7 each 1   allopurinol (ZYLOPRIM) 300 MG tablet Take 1 tablet (300 mg total) by mouth daily. 90 tablet 4   amLODipine (NORVASC) 10 MG tablet Take 1 tablet (10 mg total) by mouth daily. 90 tablet 2   azelastine (ASTELIN) 0.1 % nasal spray Place 2 sprays into both nostrils 2 (two) times daily. Use in each nostril as directed 30 mL 12   colchicine 0.6 MG tablet Take 1 tablet (0.6 mg total) by mouth 2 (two) times daily as needed (gout flare). 30 tablet 1   EPINEPHrine 0.3 mg/0.3 mL IJ SOAJ injection Insert into muscle once with any facial or neck swelling  Instruct in use 1 Device 1   escitalopram (LEXAPRO) 20 MG tablet Take 1 tablet (20 mg total) by mouth daily. 60 tablet 2   ferrous sulfate 325 (65 FE) MG EC tablet Take 1 tablet (325 mg total) by mouth daily with breakfast.     folic acid (FOLVITE) 1 MG tablet Take 1 tablet (1 mg total) by mouth daily. 90 tablet 1   glucose blood (FREESTYLE PRECISION NEO TEST) test strip Use as instructed 100 each 12   Lancets (FREESTYLE) lancets Use as instructed 100 each 12   metFORMIN (GLUCOPHAGE-XR) 500 MG 24 hr tablet Take 2 tablets (1,000 mg total) by mouth daily with supper. 120 tablet 3   mirtazapine (REMERON SOL-TAB) 15 MG  disintegrating tablet Take 1 tablet (15 mg total) by mouth at bedtime as needed. 30 tablet 3   Multiple Vitamin (MULTIVITAMIN WITH MINERALS) TABS tablet Take 1 tablet by mouth daily.     olopatadine (PATANOL) 0.1 % ophthalmic solution Place 1 drop into both eyes 2 (two) times daily. 5 mL 12   pantoprazole (PROTONIX) 40 MG tablet Take 1 tablet (40 mg total) by mouth daily. 30 tablet 4   rosuvastatin (CRESTOR) 20 MG tablet Take 1 tablet (20 mg total) by mouth daily. 90 tablet 2   spironolactone (ALDACTONE) 25 MG tablet  Take 1 tablet (25 mg total) by mouth daily. 90 tablet 3   valsartan (DIOVAN) 320 MG tablet Take 1 tablet (320 mg total) by mouth daily. 90 tablet 3   No facility-administered medications prior to visit.    Allergies  Allergen Reactions   Almond Mea Anaphylaxis and Swelling   Other Anaphylaxis    Almond Butter   Aspirin Nausea Only    Other Reaction(s): Not available, Unknown    ROS Review of Systems  Constitutional:  Positive for diaphoresis and fatigue. Negative for appetite change.  HENT:  Positive for postnasal drip, rhinorrhea and sneezing. Negative for ear pain, sinus pressure, sore throat, trouble swallowing and voice change.   Eyes:  Positive for redness and itching.  Respiratory:  Positive for cough. Negative for apnea, choking, chest tightness, shortness of breath, wheezing and stridor.   Cardiovascular: Negative.  Negative for chest pain, palpitations and leg swelling.  Gastrointestinal:  Positive for abdominal pain. Negative for abdominal distention, nausea and vomiting.  Endocrine: Negative.   Genitourinary: Negative.  Negative for dysuria and hematuria.  Musculoskeletal:  Positive for back pain. Negative for arthralgias, joint swelling and myalgias.  Skin: Negative.  Negative for rash.  Allergic/Immunologic: Negative.  Negative for environmental allergies and food allergies.  Neurological:  Negative for dizziness, syncope, weakness and headaches.   Hematological: Negative.  Negative for adenopathy. Does not bruise/bleed easily.  Psychiatric/Behavioral:  Negative for agitation, dysphoric mood, sleep disturbance and suicidal ideas. The patient is not nervous/anxious.       Objective:    Physical Exam Vitals reviewed.  Constitutional:      Appearance: Normal appearance. She is well-developed and normal weight. She is not diaphoretic.  HENT:     Head: Normocephalic and atraumatic.     Nose: Congestion and rhinorrhea present. No nasal deformity, septal deviation or mucosal edema.     Right Sinus: No maxillary sinus tenderness or frontal sinus tenderness.     Left Sinus: No maxillary sinus tenderness or frontal sinus tenderness.     Comments: Bilateral purulence in naris    Mouth/Throat:     Mouth: Mucous membranes are moist.     Pharynx: Oropharynx is clear. No oropharyngeal exudate or posterior oropharyngeal erythema.  Eyes:     General: No scleral icterus.       Right eye: Discharge present.        Left eye: Discharge present.    Pupils: Pupils are equal, round, and reactive to light.     Comments: Erythema and conjunctiva  Neck:     Thyroid: No thyromegaly.     Vascular: No carotid bruit or JVD.     Trachea: Trachea normal. No tracheal tenderness or tracheal deviation.  Cardiovascular:     Rate and Rhythm: Normal rate and regular rhythm.     Chest Wall: PMI is not displaced.     Pulses: Normal pulses. No decreased pulses.     Heart sounds: Normal heart sounds, S1 normal and S2 normal. Heart sounds not distant. No murmur heard.    No systolic murmur is present.     No diastolic murmur is present.     No friction rub. No gallop. No S3 or S4 sounds.  Pulmonary:     Effort: Pulmonary effort is normal. No tachypnea, accessory muscle usage or respiratory distress.     Breath sounds: No stridor. No decreased breath sounds, wheezing, rhonchi or rales.  Chest:     Chest wall: No tenderness.  Abdominal:  General: Abdomen is  flat. Bowel sounds are normal. There is no distension.     Palpations: Abdomen is soft. Abdomen is not rigid. There is no mass.     Tenderness: There is abdominal tenderness. There is right CVA tenderness and left CVA tenderness. There is no guarding or rebound.     Comments: Suprapubic tenderness  Musculoskeletal:        General: No tenderness. Normal range of motion.     Cervical back: Normal range of motion and neck supple. No edema, erythema or rigidity. No muscular tenderness. Normal range of motion.     Right lower leg: No edema.     Left lower leg: No edema.     Right foot: Tenderness: tophi present on metatarsal of first toe.     Left foot: Normal.     Comments: No pain or edema or tenderness in the right leg There is bruising behind the posterior aspect of the left knee  Lymphadenopathy:     Head:     Right side of head: No submental or submandibular adenopathy.     Left side of head: No submental or submandibular adenopathy.     Cervical: No cervical adenopathy.  Skin:    General: Skin is warm and dry.     Coloration: Skin is not pale.     Findings: No rash.     Nails: There is no clubbing.  Neurological:     Mental Status: She is alert and oriented to person, place, and time.     Sensory: No sensory deficit.  Psychiatric:        Attention and Perception: Attention and perception normal.        Mood and Affect: Affect normal. Mood is anxious.        Speech: Speech normal.        Behavior: Behavior normal.        Thought Content: Thought content normal.        Cognition and Memory: Cognition and memory normal.        Judgment: Judgment normal.     There were no vitals taken for this visit. Wt Readings from Last 3 Encounters:  08/21/22 120 lb (54.4 kg)  08/13/22 120 lb 3.2 oz (54.5 kg)  07/28/22 125 lb (56.7 kg)     Health Maintenance Due  Topic Date Due   Zoster Vaccines- Shingrix (1 of 2) Never done   OPHTHALMOLOGY EXAM  06/10/2017   Lung Cancer Screening   03/28/2020   Diabetic kidney evaluation - Urine ACR  06/13/2022   INFLUENZA VACCINE  08/20/2022   COVID-19 Vaccine (3 - 2023-24 season) 09/20/2022    There are no preventive care reminders to display for this patient. Vas US Renal Artery Bilateral 12/19/20  Right: Normal size right kidney. Normal right Resisitive Index. No         evidence of right renal artery stenosis. RRV flow present.  Left:  Normal size of left kidney. Normal left Resistive Index.         1-59% stenosis of the left renal artery. LRV flow present.  Mesenteric:  Normal Celiac artery findings.      Cor CT 03/29/19   IMPRESSION: LAD is a large vessel with mild (25-49%) calcified plaque in the proximal portion. The mid and distal portion with no plaque. D1 small vessel with no plaque. LCX is a non-dominant artery that gives rise to one large OM1 branch. There is minimal (1-24%) calcified plaque in the proximal  portion of the LCX. The mid and distal portion with no plaque. There is no plaque in the OM1 branch. 1. Coronary calcium score of 50.4. This was 87 percentile for age and sex matched control. 2. Normal coronary origin with right dominance. 3. Mild Coronary artery disease. CADRADS 2. Recommend medical therapy. 4. Mild calcification of the aortic root.  Venous Dopplers February 24 reviewed showed DVT common femoral vein right leg and some clot up in the right iliac vein Lab Results  Component Value Date   TSH 0.967 08/13/2022   Lab Results  Component Value Date   WBC 7.5 08/13/2022   HGB 10.3 (L) 08/13/2022   HCT 30.3 (L) 08/13/2022   MCV 89 08/13/2022   PLT 390 08/13/2022   Lab Results  Component Value Date   NA 139 08/13/2022   K 3.0 (L) 08/13/2022   CO2 28 08/13/2022   GLUCOSE 130 (H) 08/13/2022   BUN 7 08/13/2022   CREATININE 0.90 08/13/2022   BILITOT 0.6 08/13/2022   ALKPHOS 103 08/13/2022   AST 29 08/13/2022   ALT 21 08/13/2022   PROT 6.9 08/13/2022   ALBUMIN 3.8 08/13/2022    CALCIUM 9.9 08/13/2022   ANIONGAP 8 12/24/2021   EGFR 75 08/13/2022   GFR 95.71 06/06/2019   Lab Results  Component Value Date   CHOL 245 (H) 02/26/2022   Lab Results  Component Value Date   HDL 72 02/26/2022   Lab Results  Component Value Date   LDLCALC 142 (H) 02/26/2022   Lab Results  Component Value Date   TRIG 174 (H) 02/26/2022   Lab Results  Component Value Date   CHOLHDL 3.4 02/26/2022   Lab Results  Component Value Date   HGBA1C 5.9 08/13/2022      Assessment & Plan:   Problem List Items Addressed This Visit   None   No orders of the defined types were placed in this encounter. 35 minutes spent complex decision making multisystems assessed review of multiple associate consultant notes education to patient  Congratulated patient on excellent improvement Follow-up: No follow-ups on file.    Shan Levans, MD

## 2022-12-29 ENCOUNTER — Encounter: Payer: Self-pay | Admitting: Critical Care Medicine

## 2022-12-29 ENCOUNTER — Ambulatory Visit: Payer: BLUE CROSS/BLUE SHIELD | Attending: Critical Care Medicine | Admitting: Critical Care Medicine

## 2022-12-29 VITALS — BP 93/68 | HR 125 | Wt 128.0 lb

## 2022-12-29 DIAGNOSIS — I82411 Acute embolism and thrombosis of right femoral vein: Secondary | ICD-10-CM

## 2022-12-29 DIAGNOSIS — I25118 Atherosclerotic heart disease of native coronary artery with other forms of angina pectoris: Secondary | ICD-10-CM

## 2022-12-29 DIAGNOSIS — Z23 Encounter for immunization: Secondary | ICD-10-CM | POA: Diagnosis not present

## 2022-12-29 DIAGNOSIS — E7800 Pure hypercholesterolemia, unspecified: Secondary | ICD-10-CM

## 2022-12-29 DIAGNOSIS — Z1231 Encounter for screening mammogram for malignant neoplasm of breast: Secondary | ICD-10-CM

## 2022-12-29 DIAGNOSIS — E1169 Type 2 diabetes mellitus with other specified complication: Secondary | ICD-10-CM | POA: Diagnosis not present

## 2022-12-29 DIAGNOSIS — K635 Polyp of colon: Secondary | ICD-10-CM

## 2022-12-29 DIAGNOSIS — Z122 Encounter for screening for malignant neoplasm of respiratory organs: Secondary | ICD-10-CM

## 2022-12-29 DIAGNOSIS — Z720 Tobacco use: Secondary | ICD-10-CM

## 2022-12-29 DIAGNOSIS — Z7984 Long term (current) use of oral hypoglycemic drugs: Secondary | ICD-10-CM

## 2022-12-29 DIAGNOSIS — K760 Fatty (change of) liver, not elsewhere classified: Secondary | ICD-10-CM

## 2022-12-29 DIAGNOSIS — F17210 Nicotine dependence, cigarettes, uncomplicated: Secondary | ICD-10-CM

## 2022-12-29 DIAGNOSIS — D509 Iron deficiency anemia, unspecified: Secondary | ICD-10-CM | POA: Diagnosis not present

## 2022-12-29 DIAGNOSIS — I10 Essential (primary) hypertension: Secondary | ICD-10-CM

## 2022-12-29 DIAGNOSIS — I701 Atherosclerosis of renal artery: Secondary | ICD-10-CM

## 2022-12-29 DIAGNOSIS — K222 Esophageal obstruction: Secondary | ICD-10-CM

## 2022-12-29 MED ORDER — PANTOPRAZOLE SODIUM 40 MG PO TBEC: 40.0000 mg | DELAYED_RELEASE_TABLET | Freq: Every day | ORAL | 4 refills | Status: DC

## 2022-12-29 MED ORDER — ROSUVASTATIN CALCIUM 20 MG PO TABS: 20.0000 mg | ORAL_TABLET | Freq: Every day | ORAL | 2 refills | Status: AC

## 2022-12-29 MED ORDER — AMLODIPINE BESYLATE 10 MG PO TABS: 10.0000 mg | ORAL_TABLET | Freq: Every day | ORAL | 2 refills | Status: AC

## 2022-12-29 MED ORDER — MIRTAZAPINE 15 MG PO TBDP: 15.0000 mg | ORAL_TABLET | Freq: Every evening | ORAL | 3 refills | Status: DC | PRN

## 2022-12-29 MED ORDER — SPIRONOLACTONE 25 MG PO TABS: 25.0000 mg | ORAL_TABLET | Freq: Every day | ORAL | 3 refills | Status: AC

## 2022-12-29 MED ORDER — FERROUS SULFATE 325 (65 FE) MG PO TBEC: 325.0000 mg | DELAYED_RELEASE_TABLET | Freq: Every day | ORAL | Status: AC

## 2022-12-29 MED ORDER — FREESTYLE PRECISION NEO TEST VI STRP: ORAL_STRIP | 12 refills | Status: DC

## 2022-12-29 MED ORDER — VALSARTAN 320 MG PO TABS: 320.0000 mg | ORAL_TABLET | Freq: Every day | ORAL | 3 refills | Status: AC

## 2022-12-29 MED ORDER — FREESTYLE LANCETS MISC: 12 refills | Status: AC

## 2022-12-29 MED ORDER — METFORMIN HCL ER 500 MG PO TB24: 1000.0000 mg | ORAL_TABLET | Freq: Every day | ORAL | 3 refills | Status: DC

## 2022-12-29 MED ORDER — ALLOPURINOL 300 MG PO TABS: 300.0000 mg | ORAL_TABLET | Freq: Every day | ORAL | 4 refills | Status: DC

## 2022-12-29 NOTE — Assessment & Plan Note (Signed)
Reorder lung cancer screening

## 2022-12-29 NOTE — Assessment & Plan Note (Signed)
No current angina continue risk factor modification with current medication

## 2022-12-29 NOTE — Assessment & Plan Note (Signed)
No difficulty with swallowing now

## 2022-12-29 NOTE — Assessment & Plan Note (Signed)
Discussed smoking cessation. Rx sent for nicotine patches and lozenges. Referral to [place], LCSW for behavioral therapy.      Current smoking consumption amount: 1 ppd  Dicsussion on advise to quit smoking and smoking impacts: CV lung impacts  Patient's willingness to quit:  Interested   Methods to quit smoking discussed:  Behavioral mod /meds  Medication management of smoking session drugs discussed:nicotine replacement: combo of nicotine patch and lozenge  Resources provided:  AVS   Setting quit date not established  Follow-up arranged 2 mo   Time spent counseling the patient:  5 min  

## 2022-12-29 NOTE — Patient Instructions (Signed)
Labs today include urine for protein Flu vaccine given All medications refilled Return 6 months

## 2022-12-29 NOTE — Assessment & Plan Note (Signed)
A1c at goal continue metformin 

## 2022-12-29 NOTE — Assessment & Plan Note (Signed)
Continue with statins.  ?

## 2022-12-29 NOTE — Assessment & Plan Note (Signed)
Blood pressure at goal check labs no change in medication

## 2022-12-29 NOTE — Assessment & Plan Note (Signed)
Will have repeat colonoscopy 4 years

## 2022-12-29 NOTE — Assessment & Plan Note (Signed)
This has resolved we will stay off Xarelto for now monitor

## 2022-12-29 NOTE — Assessment & Plan Note (Signed)
Recheck liver function

## 2022-12-29 NOTE — Assessment & Plan Note (Signed)
Continue statin. 

## 2022-12-29 NOTE — Assessment & Plan Note (Signed)
Re order mammogram

## 2022-12-30 LAB — COMPREHENSIVE METABOLIC PANEL
ALT: 46 [IU]/L — ABNORMAL HIGH (ref 0–32)
AST: 66 [IU]/L — ABNORMAL HIGH (ref 0–40)
Albumin: 3.8 g/dL (ref 3.8–4.9)
Alkaline Phosphatase: 132 [IU]/L — ABNORMAL HIGH (ref 44–121)
BUN/Creatinine Ratio: 24 — ABNORMAL HIGH (ref 9–23)
BUN: 33 mg/dL — ABNORMAL HIGH (ref 6–24)
Bilirubin Total: 0.6 mg/dL (ref 0.0–1.2)
CO2: 18 mmol/L — ABNORMAL LOW (ref 20–29)
Calcium: 9.4 mg/dL (ref 8.7–10.2)
Chloride: 101 mmol/L (ref 96–106)
Creatinine, Ser: 1.38 mg/dL — ABNORMAL HIGH (ref 0.57–1.00)
Globulin, Total: 3.4 g/dL (ref 1.5–4.5)
Glucose: 122 mg/dL — ABNORMAL HIGH (ref 70–99)
Potassium: 4.3 mmol/L (ref 3.5–5.2)
Sodium: 137 mmol/L (ref 134–144)
Total Protein: 7.2 g/dL (ref 6.0–8.5)
eGFR: 44 mL/min/{1.73_m2} — ABNORMAL LOW (ref >59)

## 2022-12-30 LAB — CBC WITH DIFFERENTIAL/PLATELET
Basophils Absolute: 0.1 10*3/uL (ref 0.0–0.2)
Basos: 1 %
EOS (ABSOLUTE): 0.1 10*3/uL (ref 0.0–0.4)
Eos: 1 %
Hematocrit: 42.2 % (ref 34.0–46.6)
Hemoglobin: 13.8 g/dL (ref 11.1–15.9)
Immature Grans (Abs): 0 10*3/uL (ref 0.0–0.1)
Immature Granulocytes: 0 %
Lymphocytes Absolute: 2.9 10*3/uL (ref 0.7–3.1)
Lymphs: 38 %
MCH: 28.5 pg (ref 26.6–33.0)
MCHC: 32.7 g/dL (ref 31.5–35.7)
MCV: 87 fL (ref 79–97)
Monocytes Absolute: 0.5 10*3/uL (ref 0.1–0.9)
Monocytes: 7 %
Neutrophils Absolute: 4 10*3/uL (ref 1.4–7.0)
Neutrophils: 53 %
Platelets: 329 10*3/uL (ref 150–450)
RBC: 4.84 x10E6/uL (ref 3.77–5.28)
RDW: 18.3 % — ABNORMAL HIGH (ref 11.7–15.4)
WBC: 7.5 10*3/uL (ref 3.4–10.8)

## 2022-12-30 LAB — IRON,TIBC AND FERRITIN PANEL
Ferritin: 277 ng/mL — ABNORMAL HIGH (ref 15–150)
Iron Saturation: 43 % (ref 15–55)
Iron: 157 ug/dL (ref 27–159)
Total Iron Binding Capacity: 369 ug/dL (ref 250–450)
UIBC: 212 ug/dL (ref 131–425)

## 2023-01-14 LAB — MICROALBUMIN / CREATININE URINE RATIO
Creatinine, Urine: 131.3 mg/dL
Microalb/Creat Ratio: 76 mg/g{creat} — ABNORMAL HIGH (ref 0–29)
Microalbumin, Urine: 100.1 ug/mL

## 2023-02-09 ENCOUNTER — Ambulatory Visit (HOSPITAL_BASED_OUTPATIENT_CLINIC_OR_DEPARTMENT_OTHER): Payer: Medicaid Other | Admitting: Family

## 2023-02-09 ENCOUNTER — Encounter (HOSPITAL_BASED_OUTPATIENT_CLINIC_OR_DEPARTMENT_OTHER): Payer: Self-pay | Admitting: Family

## 2023-02-09 VITALS — BP 132/88 | HR 103 | Ht 63.0 in | Wt 130.7 lb

## 2023-02-09 DIAGNOSIS — I2510 Atherosclerotic heart disease of native coronary artery without angina pectoris: Secondary | ICD-10-CM

## 2023-02-09 DIAGNOSIS — Z720 Tobacco use: Secondary | ICD-10-CM

## 2023-02-09 DIAGNOSIS — I10 Essential (primary) hypertension: Secondary | ICD-10-CM | POA: Diagnosis not present

## 2023-02-09 DIAGNOSIS — I701 Atherosclerosis of renal artery: Secondary | ICD-10-CM | POA: Diagnosis not present

## 2023-02-09 DIAGNOSIS — I25118 Atherosclerotic heart disease of native coronary artery with other forms of angina pectoris: Secondary | ICD-10-CM | POA: Diagnosis not present

## 2023-02-09 DIAGNOSIS — E785 Hyperlipidemia, unspecified: Secondary | ICD-10-CM

## 2023-02-09 NOTE — Progress Notes (Signed)
Cardiology Office Note:  .   Date:  02/09/2023  ID:  BLAIRE KRITIKOS, DOB 07/11/1965, MRN [id number] PCP: Storm Frisk, MD  [facility] Providers Cardiologist:  Chilton Si, MD    History of Present Illness: .   ERISA EUNICE is a 58 y.o. female with a hx of mild CAD, HTN, HLD, fatty liver disease, elevated LFTs, tobacco use.   Seen by Dr. Constance Goltz and referred for CT for unintentional weight loss, tobacco use. CT noted mild to moderate calcified plaque in thoracic aorta and LAD coronary artery calcification. She was referred to cardiology. Coronary CTA 03/29/19 with mild plaque in LAD and minimal plaque in LCx. Atorvastatin previously increased however had elevated LFTs which normalized and she was increased ot 40mg .    HCTZ previously transitioned to Amlodipine due to hypokalemia. However, given elevated BP Valsartan-HCTZ initiated and Amlodipine reduced with improvement in LE edema. Renal artery dopplers with mild to moderate left renal artery stenosis, stable by duplex 11/2021. Labs negative for hyperaldosteronism. Hydrochlorothiazide later transitioned to Spironolactone.     She presents today for follow up independently. Feels "a little bit winded" with her two flights of stairs at home particuarly if carrying something but no dyspnea with normal activity. No chest pain, pressure, tightness. Reports BP has been overall controlled at home. Notes she is not lightheaded as often as previous. She did have eval with her optometrist who suggested she have her carotid arteries checked. Notes occasional palpitations more often at rest which she attributes to stress. Working with counseling on stress reduction techniques.   ROS: Please see the history of present illness.    All other systems reviewed and are negative.   Studies Reviewed: Marland Kitchen   EKG Interpretation Date/Time:  Tuesday February 09 2023 09:52:15 EST Ventricular Rate:  103 PR Interval:  130 QRS Duration:  66 QT  Interval:  334 QTC Calculation: 437 R Axis:   64  Text Interpretation: Sinus tachycardia When compared with ECG of 19-Dec-2021 11:53, PREVIOUS ECG IS PRESENT Confirmed by Gillian Shields ([id number]) on 02/09/2023 9:58:20 AM    Cardiac Studies & Procedures      ECHOCARDIOGRAM  ECHOCARDIOGRAM COMPLETE 12/02/2021  Narrative ECHOCARDIOGRAM REPORT    Patient Name:   MARAYA OLEKSAK Axe Date of Exam: 12/02/2021 Medical Rec #:  [id number]       Height:       63.0 in Accession #:    [id number]      Weight:       133.8 lb Date of Birth:  September 29, 1965        BSA:          1.630 m Patient Age:    56 years        BP:           138/92 mmHg Patient Gender: F               HR:           84 bpm. Exam Location:  Outpatient  Procedure: 2D Echo, 3D Echo, Color Doppler, Cardiac Doppler and Strain Analysis  Indications:    R06.02 SOB  History:        Patient has no prior history of Echocardiogram examinations. Risk Factors:Hypertension, Current Smoker, Dyslipidemia and Diabetes.  Sonographer:    Jeryl Columbia RDCS Referring Phys: [id number] TIFFANY [place]  IMPRESSIONS   1. Left ventricular ejection fraction, by estimation, is 60 to 65%. The left ventricle has normal function. The left ventricle has  no regional wall motion abnormalities. There is mild concentric left ventricular hypertrophy. Left ventricular diastolic parameters were normal. 2. Right ventricular systolic function is normal. The right ventricular size is normal. 3. The mitral valve is normal in structure. No evidence of mitral valve regurgitation. No evidence of mitral stenosis. 4. The aortic valve is normal in structure. Aortic valve regurgitation is not visualized. No aortic stenosis is present. 5. The inferior vena cava is normal in size with greater than 50% respiratory variability, suggesting right atrial pressure of 3 mmHg.  FINDINGS Left Ventricle: Left ventricular ejection fraction, by estimation, is 60 to 65%. The left ventricle  has normal function. The left ventricle has no regional wall motion abnormalities. The left ventricular internal cavity size was normal in size. There is mild concentric left ventricular hypertrophy. Left ventricular diastolic parameters were normal.  Right Ventricle: The right ventricular size is normal. No increase in right ventricular wall thickness. Right ventricular systolic function is normal.  Left Atrium: Left atrial size was normal in size.  Right Atrium: Right atrial size was normal in size.  Pericardium: There is no evidence of pericardial effusion.  Mitral Valve: The mitral valve is normal in structure. No evidence of mitral valve regurgitation. No evidence of mitral valve stenosis.  Tricuspid Valve: The tricuspid valve is normal in structure. Tricuspid valve regurgitation is not demonstrated. No evidence of tricuspid stenosis.  Aortic Valve: The aortic valve is normal in structure. Aortic valve regurgitation is not visualized. No aortic stenosis is present.  Pulmonic Valve: The pulmonic valve was normal in structure. Pulmonic valve regurgitation is not visualized. No evidence of pulmonic stenosis.  Aorta: The aortic root is normal in size and structure.  Venous: The inferior vena cava is normal in size with greater than 50% respiratory variability, suggesting right atrial pressure of 3 mmHg.  IAS/Shunts: No atrial level shunt detected by color flow Doppler.   LEFT VENTRICLE PLAX 2D LVIDd:         3.59 cm   Diastology LVIDs:         2.45 cm   LV e' medial:    6.42 cm/s LV PW:         1.31 cm   LV E/e' medial:  14.8 LV IVS:        1.23 cm   LV e' lateral:   9.36 cm/s LVOT diam:     2.00 cm   LV E/e' lateral: 10.1 LV SV:         60 LV SV Index:   37 LVOT Area:     3.14 cm  3D Volume EF: 3D EF:        66 % LV EDV:       79 ml LV ESV:       27 ml LV SV:        52 ml  RIGHT VENTRICLE RV Basal diam:  3.05 cm RV Mid diam:    2.29 cm RV S prime:     12.50  cm/s TAPSE (M-mode): 2.8 cm  LEFT ATRIUM             Index        RIGHT ATRIUM           Index LA diam:        3.40 cm 2.09 cm/m   RA Area:     13.20 cm LA Vol (A2C):   39.1 ml 23.99 ml/m  RA Volume:   32.20 ml  19.75 ml/m LA  Vol (A4C):   53.1 ml 32.58 ml/m LA Biplane Vol: 47.4 ml 29.08 ml/m AORTIC VALVE LVOT Vmax:   96.80 cm/s LVOT Vmean:  61.600 cm/s LVOT VTI:    0.190 m  AORTA Ao Root diam: 3.10 cm Ao Asc diam:  3.00 cm  MITRAL VALVE                TRICUSPID VALVE MV Area (PHT): 3.72 cm     TR Peak grad:   22.1 mmHg MV Decel Time: 204 msec     TR Vmax:        235.00 cm/s MV E velocity: 95.00 cm/s MV A velocity: 110.00 cm/s  SHUNTS MV E/A ratio:  0.86         Systemic VTI:  0.19 m Systemic Diam: 2.00 cm  Kardie Tobb DO Electronically signed by Thomasene Ripple DO Signature Date/Time: 12/02/2021/1:33:44 PM    Final    CT SCANS  CT CORONARY MORPH W/CTA COR W/SCORE 03/29/2019  Addendum 03/29/2019  5:39 PM ADDENDUM REPORT: 03/29/2019 17:37  CLINICAL DATA:  58 year old female with dyspnea and calcium on CT. Medical history includes Diabetes Mellitus, Hypertension and Hyperlipidemia.  EXAM: Cardiac/Coronary  CT  TECHNIQUE: The patient was scanned on a [employer].  FINDINGS: A 120 kV prospective scan was triggered in the descending thoracic aorta at 111 HU's. Axial non-contrast 3 mm slices were carried out through the heart. The data set was analyzed on a dedicated work station and scored using the Agatson method. Gantry rotation speed was 250 msecs and collimation was .6 mm. No beta blockade and 0.8 mg of sl NTG was given. The 3D data set was reconstructed in 5% intervals of the 67-82 % of the R-R cycle. Diastolic phases were analyzed on a dedicated work station using MPR, MIP and VRT modes. The patient received 80 cc of contrast.  Aorta: Normal size. Mild Calcification of the aortic root. No dissection.  Aortic Valve:  Trileaflet.  No  calcifications.  Coronary Arteries:  Normal coronary origin.  Right dominance.  RCA is a large dominant artery that gives rise to PDA and PLVB. There is no plaque.  Left main is a large artery that gives rise to LAD and LCX arteries.  LAD is a large vessel with mild (25-49%) calcified plaque in the proximal portion. The mid and distal portion with no plaque. D1 small vessel with no plaque.  LCX is a non-dominant artery that gives rise to one large OM1 branch. There is minimal (1-24%) calcified plaque in the proximal portion of the LCX. The mid and distal portion with no plaque. There is no plaque in the OM1 branch.  Other findings:  Normal pulmonary vein drainage into the left atrium.  Normal left atrial appendage without a thrombus.  Normal size of the pulmonary artery.  IMPRESSION: 1. Coronary calcium score of 50.4. This was 52 percentile for age and sex matched control.  2. Normal coronary origin with right dominance.  3. Mild Coronary artery disease. CADRADS 2. Recommend medical therapy.  4. Mild calcification of the aortic root.  Thomasene Ripple, DO   Electronically Signed By: Thomasene Ripple MD On: 03/29/2019 17:37  Narrative EXAM: OVER-READ INTERPRETATION  CT CHEST  The following report is an over-read performed by radiologist Dr. Charlett Nose of [facility] on 03/29/2019. This over-read does not include interpretation of cardiac or coronary anatomy or pathology. The coronary CTA interpretation by the cardiologist is attached.  COMPARISON:  None.  FINDINGS: Vascular: Heart is normal size. Aorta is normal caliber. Calcifications in the visualized descending thoracic aorta.  Mediastinum/Nodes: Large hiatal hernia. No adenopathy in the lower mediastinum or hila.  Lungs/Pleura: No confluent opacities or effusions.  Upper Abdomen: Imaging into the upper abdomen shows no acute findings.  Musculoskeletal: Chest wall soft tissues are  unremarkable. No acute bony abnormality.  IMPRESSION: Large hiatal hernia.  Descending aortic atherosclerosis.  No acute extra cardiac abnormality.  Electronically Signed: By: Charlett Nose M.D. On: 03/29/2019 10:16          Risk Assessment/Calculations:             Physical Exam:   VS:  BP 132/88   Pulse (!) 103   Ht 5\' 3"  (1.6 m)   Wt 130 lb 11.2 oz (59.3 kg)   SpO2 99%   BMI 23.15 kg/m    Wt Readings from Last 3 Encounters:  02/09/23 130 lb 11.2 oz (59.3 kg)  12/29/22 128 lb (58.1 kg)  08/21/22 120 lb (54.4 kg)    GEN: Well nourished, well developed in no acute distress NECK: No JVD; No carotid bruits CARDIAC: RRR, no murmurs, rubs, gallops RESPIRATORY:  Clear to auscultation without rales, wheezing or rhonchi  ABDOMEN: Soft, non-tender, non-distended EXTREMITIES:  No edema; No deformity   ASSESSMENT AND PLAN: .   CAD / HLD- 03/2019 calcium score 50.4 with mild CAD. Stable with no anginal symptoms. No indication for ischemic evaluation.  GDMT Atorvastatin. Heart healthy diet and regular cardiovascular exercise encouraged.  Update CMP, lipid panel. If LDL not at goal, consider Nexlizet vs Zetia vs PCSK9i.  As her optometrist noted changes consistent with carotid stenosis on eye exam and known CAD, plan for carotid duplex.   Renal artery stenosis - 11/2021 L renal artery 1-59% stenosis, stable compared to previous. Repeat renal duplex for monitoring.   Sinus tachycardia - noted by EKG today . Reports rare palpitations. Encouraged deep breathing for management. Avoid caffeine, stay well hydrated, manage stress well to prevent palpitations. If these worsen could consider ZIO monitor in the future.  HTN - BP reasonably controlled. Continue Amlodipine 10mg  daily, Spironolactone 25mg  daily, Valsartan 320mg  daily. Discussed to monitor BP at home at least 2 hours after medications and sitting for 5-10 minutes.   Tobacco use - Smoking cessation encouraged. Recommend  utilization of 1800QUITNOW. Following with care guide. Hesitant about Wellbutrin as mom did not tolerate.  Congratulated on reducing to less than 1 pack/day and continues to work with her counselor on stress techniques to help with smoking cessation.       Dispo: follow up in 1 year  Signed, Alver Sorrow, NP

## 2023-02-09 NOTE — Patient Instructions (Signed)
Medication Instructions:  Your physician recommends that you continue on your current medications as directed. Please refer to the Current Medication list given to you today.   Lab Work: Your physician recommends that you return for fasting lab work today: CMP and LIPIDS  If you have labs (blood work) drawn today and your tests are completely normal, you will receive your results only by: [employer] (if you have MyChart) OR A paper copy in the mail If you have any lab test that is abnormal or we need to change your treatment, we will call you to review the results.   Testing/Procedures: Your physician has requested that you have a carotid duplex. This test is an ultrasound of the carotid arteries in your neck. It looks at blood flow through these arteries that supply the brain with blood. Allow one hour for this exam. There are no restrictions or special instructions.  Your physician has requested that you have a renal artery duplex. During this test, an ultrasound is used to evaluate blood flow to the kidneys. Allow one hour for this exam. Do not eat after midnight the day before and avoid carbonated beverages. Take your medications as you usually do.   Follow-Up: At [facility], you and your health needs are our priority.  As part of our continuing mission to provide you with exceptional heart care, we have created designated Provider Care Teams.  These Care Teams include your primary Cardiologist (physician) and Advanced Practice Providers (APPs -  Physician Assistants and Nurse Practitioners) who all work together to provide you with the care you need, when you need it.  We recommend signing up for the patient portal called "MyChart".  Sign up information is provided on this After Visit Summary.  MyChart is used to connect with patients for Virtual Visits (Telemedicine).  Patients are able to view lab/test results, encounter notes, upcoming appointments, etc.  Non-urgent  messages can be sent to your provider as well.   To learn more about what you can do with MyChart, go to [email or web address].    Your next appointment:   1 year(s)  Provider:   Chilton Si, MD or Gillian Shields, NP    Other Instructions       To prevent palpitations: Make sure you are adequately hydrated.  Avoid and/or limit caffeine containing beverages like soda or tea. Exercise regularly.  Manage stress well. Some over the counter medications can cause palpitations such as Benadryl, AdvilPM, TylenolPM. Regular Advil or Tylenol do not cause palpitations.    Pursed Lip Breathing Pursed lip breathing is a technique to relieve the feeling of being short of breath.  Being short of breath can make you tense and anxious. Before you start this breathing exercise, take a minute to relax your shoulders and close your eyes. Then: Start the exercise by closing your mouth. Breathe in through your nose, taking a normal breath. You can do this at your normal rate of breathing. If you feel you are not getting enough air, breathe in while slowly counting to 2 or 3. Pucker (purse) your lips as if you were going to whistle. Gently tighten the muscles of your abdomenor press on your abdomen to help push the air out. Breathe out slowly through your pursed lips. Take at least twice as long to breathe out as it takes you to breathe in. Make sure that you breathe out all of the air, but do not force air out.

## 2023-02-10 LAB — COMPREHENSIVE METABOLIC PANEL
ALT: 202 [IU]/L — ABNORMAL HIGH (ref 0–32)
AST: 556 [IU]/L (ref 0–40)
Albumin: 3.9 g/dL (ref 3.8–4.9)
Alkaline Phosphatase: 160 [IU]/L — ABNORMAL HIGH (ref 44–121)
BUN/Creatinine Ratio: 23 (ref 9–23)
BUN: 20 mg/dL (ref 6–24)
Bilirubin Total: 0.3 mg/dL (ref 0.0–1.2)
CO2: 23 mmol/L (ref 20–29)
Calcium: 9.8 mg/dL (ref 8.7–10.2)
Chloride: 99 mmol/L (ref 96–106)
Creatinine, Ser: 0.87 mg/dL (ref 0.57–1.00)
Globulin, Total: 3.5 g/dL (ref 1.5–4.5)
Glucose: 158 mg/dL — ABNORMAL HIGH (ref 70–99)
Potassium: 4.8 mmol/L (ref 3.5–5.2)
Sodium: 137 mmol/L (ref 134–144)
Total Protein: 7.4 g/dL (ref 6.0–8.5)
eGFR: 77 mL/min/{1.73_m2} (ref >59)

## 2023-02-10 LAB — LIPID PANEL
Chol/HDL Ratio: 4.5 {ratio} — ABNORMAL HIGH (ref 0.0–4.4)
Cholesterol, Total: 189 mg/dL (ref 100–199)
HDL: 42 mg/dL (ref >39)
Triglycerides: 1082 mg/dL (ref 0–149)

## 2023-02-11 ENCOUNTER — Telehealth (HOSPITAL_BASED_OUTPATIENT_CLINIC_OR_DEPARTMENT_OTHER): Payer: Self-pay

## 2023-02-11 ENCOUNTER — Encounter (HOSPITAL_BASED_OUTPATIENT_CLINIC_OR_DEPARTMENT_OTHER): Payer: Self-pay

## 2023-02-11 NOTE — Telephone Encounter (Signed)
Called and left VM for pt with NP's recommendations. (OK per DPR)  ----- Message from Alver Sorrow sent at 02/11/2023  8:19 AM EST ----- Normal kidneys, electrolytes.    Liver enzymes markedly elevated. Stop Rosuvastatin.  Triglycerides markedly elevated as well, ensure avoiding sweets/sugars/soda.    Needs to follow up with PCP within next 2 weeks for further evaluation  Labs routed to PCP as [facility]

## 2023-02-11 NOTE — Telephone Encounter (Signed)
-----   Message from Alver Sorrow sent at 02/11/2023  8:19 AM EST ----- Normal kidneys, electrolytes.   Liver enzymes markedly elevated. Stop Rosuvastatin.  Triglycerides markedly elevated as well, ensure avoiding sweets/sugars/soda.   Needs to follow up with PCP within next 2 weeks for further evaluation  Labs routed to PCP as [facility]

## 2023-03-05 ENCOUNTER — Telehealth (HOSPITAL_BASED_OUTPATIENT_CLINIC_OR_DEPARTMENT_OTHER): Payer: Self-pay | Admitting: Cardiovascular Disease

## 2023-03-05 DIAGNOSIS — I701 Atherosclerosis of renal artery: Secondary | ICD-10-CM

## 2023-03-05 DIAGNOSIS — I25118 Atherosclerotic heart disease of native coronary artery with other forms of angina pectoris: Secondary | ICD-10-CM

## 2023-03-05 DIAGNOSIS — I10 Essential (primary) hypertension: Secondary | ICD-10-CM

## 2023-03-05 DIAGNOSIS — E785 Hyperlipidemia, unspecified: Secondary | ICD-10-CM

## 2023-03-05 NOTE — Telephone Encounter (Signed)
Called pt and left VM to let her know about her potential balance due to her insurance being out of network for [employer].  Patient returned the call and I explained how she is in network with [facility], not [facility]. She decided to cancel her appointments for cardiology imaging on 2/18. Pt later called back again to request assistance with a referral to a cardiologist in [facility]. Gillian Shields, NP agreed to help with the referral. Called pt and left VM letting her know.

## 2023-03-08 NOTE — Telephone Encounter (Signed)
Referred to [facility].   [address], [place], [place] [address]  Phone [phone]  Fax [phone]   Alver Sorrow, NP

## 2023-03-08 NOTE — Addendum Note (Signed)
Addended by: Alver Sorrow on: 03/08/2023 05:03 PM   Modules accepted: Orders

## 2023-03-09 ENCOUNTER — Encounter (HOSPITAL_BASED_OUTPATIENT_CLINIC_OR_DEPARTMENT_OTHER): Payer: Self-pay

## 2023-03-09 ENCOUNTER — Encounter (HOSPITAL_BASED_OUTPATIENT_CLINIC_OR_DEPARTMENT_OTHER): Payer: BLUE CROSS/BLUE SHIELD

## 2023-04-12 ENCOUNTER — Encounter: Payer: BLUE CROSS/BLUE SHIELD | Admitting: Obstetrics and Gynecology

## 2023-07-05 LAB — HM DIABETES EYE EXAM

## 2023-07-06 ENCOUNTER — Encounter: Payer: Self-pay | Admitting: Family Medicine

## 2023-07-06 ENCOUNTER — Ambulatory Visit: Admitting: Family Medicine

## 2023-07-06 ENCOUNTER — Telehealth: Payer: Self-pay | Admitting: Family Medicine

## 2023-07-06 VITALS — BP 124/80 | HR 100 | Temp 98.4°F | Ht 63.0 in | Wt 141.2 lb

## 2023-07-06 DIAGNOSIS — Z7984 Long term (current) use of oral hypoglycemic drugs: Secondary | ICD-10-CM

## 2023-07-06 DIAGNOSIS — K222 Esophageal obstruction: Secondary | ICD-10-CM

## 2023-07-06 DIAGNOSIS — E7800 Pure hypercholesterolemia, unspecified: Secondary | ICD-10-CM | POA: Diagnosis not present

## 2023-07-06 DIAGNOSIS — F331 Major depressive disorder, recurrent, moderate: Secondary | ICD-10-CM

## 2023-07-06 DIAGNOSIS — E1169 Type 2 diabetes mellitus with other specified complication: Secondary | ICD-10-CM

## 2023-07-06 DIAGNOSIS — Z716 Tobacco abuse counseling: Secondary | ICD-10-CM

## 2023-07-06 DIAGNOSIS — I10 Essential (primary) hypertension: Secondary | ICD-10-CM | POA: Diagnosis not present

## 2023-07-06 DIAGNOSIS — G4709 Other insomnia: Secondary | ICD-10-CM

## 2023-07-06 DIAGNOSIS — Z1231 Encounter for screening mammogram for malignant neoplasm of breast: Secondary | ICD-10-CM

## 2023-07-06 DIAGNOSIS — Z7689 Persons encountering health services in other specified circumstances: Secondary | ICD-10-CM

## 2023-07-06 MED ORDER — PANTOPRAZOLE SODIUM 40 MG PO TBEC: 40.0000 mg | DELAYED_RELEASE_TABLET | Freq: Every day | ORAL | 1 refills | Status: AC

## 2023-07-06 MED ORDER — VARENICLINE TARTRATE 0.5 MG PO TABS
0.5000 mg | ORAL_TABLET | Freq: Two times a day (BID) | ORAL | 2 refills | Status: AC
Start: 2023-07-06 — End: ?

## 2023-07-06 MED ORDER — MIRTAZAPINE 15 MG PO TBDP: 15.0000 mg | ORAL_TABLET | Freq: Every evening | ORAL | 1 refills | Status: AC | PRN

## 2023-07-06 NOTE — Progress Notes (Signed)
 Patient Office Visit  Assessment & Plan:  Encounter to establish care  Primary hypertension  Pure hypercholesterolemia  Screening mammogram for breast cancer -     3D Screening Mammogram, Left and Right; Future  Esophageal stricture -     Pantoprazole  Sodium; Take 1 tablet (40 mg total) by mouth daily.  Dispense: 90 tablet; Refill: 1  Type 2 diabetes mellitus with other specified complication, without long-term current use of insulin  (HCC)  Moderate episode of recurrent major depressive disorder (HCC)  Encounter for tobacco use cessation counseling -     Varenicline  Tartrate; Take 1 tablet (0.5 mg total) by mouth 2 (two) times daily.  Dispense: 60 tablet; Refill: 2  Other insomnia -     Mirtazapine ; Take 1 tablet (15 mg total) by mouth at bedtime as needed.  Dispense: 90 tablet; Refill: 1   Assessment and Plan    Alcohol Use Disorder Alcohol use disorder persists with reduced consumption. Engaged in therapy at [facility]. - Continue therapy at [facility]. - Encourage further reduction in alcohol consumption.  Tobacco Use Disorder Long-term smoker, currently reducing. Interested in quitting with concerns about Chantix  side effects. - Prescribe Chantix  for smoking cessation. - Set a quit date for smoking cessation. - Discuss potential side effects of Chantix , including nausea and nightmares. - Consider nicotine  patches if Chantix  is not tolerated.  Diabetes Mellitus Diabetes managed with metformin  and another medication. Recent labs show elevated glucose. - Continue metformin  and other diabetes medication. - Monitor blood glucose levels.  Hyperlipidemia On medication for hyperlipidemia. - Continue current cholesterol medication.  Gastroesophageal Reflux Disease (GERD) On medication for GERD. - Continue current reflux medication.  General Health Maintenance Up to date on colonoscopies. Needs mammogram and tetanus vaccination. Declined shingles  vaccine due to family history of Guillain-Barr syndrome. - Order mammogram at the breast center. - Ensure up-to-date colonoscopy schedule. - Discuss tetanus vaccination options. - Respect decision to decline shingles vaccine.     Test results were reviewed and analyzed as part of the medical decision making of this visit.  Reviewed previous notes from and [facility] and reviewed pulmonary notes and recent lab work done in April.  Patient is aware on how to take Chantix .  Patient is also aware of potential negative side effects.  Return in next 2 to 3 months or sooner if necessary. Recommend healthy diet i.e mediterranean/DASH diet, consistent exercise - 30 minutes 5 day per week, and gradual weight loss.      No follow-ups on file.   Subjective:    Patient ID: Stacy [name], female    DOB: August 22, 1965  Age: 58 y.o. MRN: [id number]  Chief Complaint  Patient presents with   Medical Management of Chronic Issues   Establish Care    HPI Discussed the use of AI scribe software for clinical note transcription with the patient, who gave verbal consent to proceed.  History of Present Illness        Stacy [name] is a 58 year old female who presents to establish primary care here. She has not seen PCP in about 5 years. Patient previously saw Dr. Arlene Lacy Pulmonary specialist.   She has a history of acute renal failure two years ago, which resolved before hospital discharge without requiring nephrology follow-up. The etiology of the renal failure was not determined.  She experienced a single episode of deep vein thrombosis in her leg, treated with anticoagulants, and currently has no issues with blood clots.  She  has a history of fatty liver, gastroesophageal reflux disease, type 2 diabetes, Hypertenstion and hyperlipidemia.  She has a history of heart disease and has been under cardiology care for three years. Due to insurance changes, she is now seeing a new cardiologist  at [facility]. Recent blood work in April revealed elevated glucose levels.  Type 2 DM- She is diabetic and takes metformin  and another unspecified medication for diabetes management.  She has a history of alcohol use and has been attending therapy at [facility] for the past month. Her alcohol consumption has reduced to two to three drinks per day. She smokes half a pack of cigarettes daily and has a 20-year smoking history. She previously smoked a pack a day five years ago and is considering smoking cessation.  She has a history of colonic polyps and strictures, necessitating colonoscopies every five years. Her last hospital visit for stricture dilation was two years ago.  She needs to schedule a mammogram and prefers it to be done at the breast center. She has declined the shingles vaccine due to a family history of Guillain-Barr syndrome.  Insomnia- She takes Remeron  nightly for sleep and Lexapro  for an unspecified condition, though she is uncertain of its efficacy. Physical Exam CHEST: Lungs clear to auscultation bilaterally. Results LABS Glucose: elevated (04/2023) A1C 7.5  DIAGNOSTIC Colonoscopy: polyps, esophageal stricture (2023) Assessment & Plan Alcohol Use Disorder Alcohol use disorder persists with reduced consumption. Engaged in therapy at [facility]. - Continue therapy at [facility]. - Encourage further reduction in alcohol consumption.  Tobacco Use Disorder Long-term smoker, currently reducing. Interested in quitting with concerns about Chantix  side effects. - Prescribe Chantix  for smoking cessation. - Set a quit date for smoking cessation. - Discuss potential side effects of Chantix , including nausea and nightmares. - Consider nicotine  patches if Chantix  is not tolerated.  Type 2 Diabetes Mellitus Diabetes managed with metformin  and another medication. Recent labs show elevated glucose. - Continue metformin  and other diabetes medication (not sure  which 2nd med she is taking) - Monitor blood glucose levels.  Hyperlipidemia On medication for hyperlipidemia. - Continue current cholesterol medication.  Gastroesophageal Reflux Disease (GERD) On medication for GERD. - Continue current reflux medication.  General Health Maintenance Up to date on colonoscopies. Needs mammogram and tetanus vaccination. Declined shingles vaccine due to family history of Guillain-Barr syndrome-mom died from complications - Order mammogram at the breast center. - Ensure up-to-date colonoscopy schedule. - Discuss tetanus vaccination options. - Respect decision to decline shingles vaccine.  The 10-year ASCVD risk score (Arnett DK, et al., 2019) is: 16.3%  Past Medical History:  Diagnosis Date   Acute bronchitis 08/02/2019   Acute deep vein thrombosis (DVT) of femoral vein of right lower extremity (HCC) 04/07/2021   Adrenal adenoma    as per MRI 03/09/14   AKI (acute kidney injury) (HCC) 12/20/2020   Allergy    Anxiety    Coronary artery calcification seen on CAT scan 04/12/2018   Depression    Diabetes mellitus    Diverticulosis    Elevated LFTs    elevated since 09/2013   Fatty liver    moderate- as per 03/06/17 MRI liver   GERD (gastroesophageal reflux disease)    Gout    H/O hiatal hernia    Headache(784.0)    History of depression    Hyperlipemia    Hyperplastic colon polyp    Hypertension    Hypoglycemia 12/19/2021   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 Lydia Sams 02/27/2014   Tobacco abuse    Tubular adenoma of colon    Past Surgical History:  Procedure Laterality Date   ANAL FISTULECTOMY  01/19/1985   BALLOON DILATION N/A 12/23/2021   Procedure: BALLOON DILATION;  Surgeon: Kenney Peacemaker, MD;  Location: [facility];  Service: Gastroenterology;  Laterality: N/A;   BIOPSY  12/23/2021    Procedure: BIOPSY;  Surgeon: Kenney Peacemaker, MD;  Location: [facility];  Service: Gastroenterology;;   BREAST EXCISIONAL BIOPSY Left    BREAST LUMPECTOMY Left 1988   COLONOSCOPY WITH PROPOFOL  N/A 12/23/2021   Procedure: COLONOSCOPY WITH PROPOFOL ;  Surgeon: Kenney Peacemaker, MD;  Location: [facility];  Service: Gastroenterology;  Laterality: N/A;   ESOPHAGOGASTRODUODENOSCOPY (EGD) WITH PROPOFOL  N/A 12/23/2021   Procedure: ESOPHAGOGASTRODUODENOSCOPY (EGD) WITH PROPOFOL ;  Surgeon: Kenney Peacemaker, MD;  Location: [facility];  Service: Gastroenterology;  Laterality: N/A;   PAROTIDECTOMY  03/30/2011   Procedure: PAROTIDECTOMY;  Surgeon: Janita Mellow, MD;  Location:  SUR[facility]ERY [facility];  Service: ENT;  Laterality: Right;   WRIST GANGLION EXCISION Left 1988   Social History   Tobacco Use   Smoking status: Every Day    Current packs/day: 1.00    Average packs/day: 1 pack/day for 28.0 years (28.0 ttl pk-yrs)    Types: Cigarettes   Smokeless tobacco: Never  Vaping Use   Vaping status: Never Used  Substance Use Topics   Alcohol use: Yes    Alcohol/week: 14.0 standard drinks of alcohol    Types: 14 Standard drinks or equivalent per week    Comment: occ   Drug use: No   Family History  Problem Relation Age of Onset   Hypertension Mother    Stroke Mother    Coronary artery disease Father 56       CAD,CABG   Diabetes Sister        uncontrolled   Cancer Sister 67       uterine cancer   Aneurysm Sister        brain   Heart attack Maternal Grandmother    Blindness Paternal Grandmother    Breast cancer Neg Hx    Colon cancer Neg Hx    Esophageal cancer Neg Hx    Rectal cancer Neg Hx    Stomach cancer Neg Hx    Allergies  Allergen Reactions   Almond Meal (Obsolete) Anaphylaxis and Swelling   Other Anaphylaxis    Almond Butter   Aspirin  Nausea Only    Other Reaction(s): Not available, Unknown    ROS    Objective:    BP 124/80   Pulse 100   Temp 98.4 F (36.9 C)    Ht 5' 3 (1.6 m)   Wt 141 lb 4 oz (64.1 kg)   SpO2 98%   BMI 25.02 kg/m  BP Readings from Last 3 Encounters:  07/06/23 124/80  02/09/23 132/88  12/29/22 93/68   Wt Readings from Last 3 Encounters:  07/06/23 141 lb 4 oz (64.1 kg)  02/09/23 130 lb 11.2 oz (59.3 kg)  12/29/22 128 lb (58.1 kg)    Physical Exam Vitals and nursing note reviewed.  Constitutional:      General: She is not in acute distress.    Appearance: Normal appearance.  HENT:     Head: Normocephalic.     Right  Ear: Tympanic membrane, ear canal and external ear normal.     Left Ear: Tympanic membrane, ear canal and external ear normal.   Eyes:     Extraocular Movements: Extraocular movements intact.     Pupils: Pupils are equal, round, and reactive to light.    Cardiovascular:     Rate and Rhythm: Normal rate and regular rhythm.     Heart sounds: Normal heart sounds.  Pulmonary:     Effort: Pulmonary effort is normal.     Breath sounds: Normal breath sounds.   Musculoskeletal:     Right lower leg: No edema.     Left lower leg: No edema.   Neurological:     General: No focal deficit present.     Mental Status: She is alert and oriented to person, place, and time.   Psychiatric:        Mood and Affect: Mood normal.        Behavior: Behavior normal.        Thought Content: Thought content normal.        Judgment: Judgment normal.      No results found for any visits on 07/06/23.

## 2023-07-06 NOTE — Telephone Encounter (Signed)
 Patient declined diabetic eye exam today; stated she already had it done about 4 months ago.

## 2023-08-20 LAB — HM MAMMOGRAPHY

## 2023-09-17 ENCOUNTER — Ambulatory Visit (INDEPENDENT_AMBULATORY_CARE_PROVIDER_SITE_OTHER): Admitting: Family Medicine

## 2023-09-17 ENCOUNTER — Ambulatory Visit: Payer: Self-pay | Admitting: Family Medicine

## 2023-09-17 ENCOUNTER — Encounter: Payer: Self-pay | Admitting: Family Medicine

## 2023-09-17 ENCOUNTER — Ambulatory Visit
Admission: RE | Admit: 2023-09-17 | Discharge: 2023-09-17 | Disposition: A | Discharge status: Home / Self Care | Source: Ambulatory Visit | Attending: Family Medicine | Admitting: Family Medicine

## 2023-09-17 VITALS — BP 140/92 | HR 129 | Temp 99.3°F | Ht 63.0 in | Wt 136.1 lb

## 2023-09-17 DIAGNOSIS — J209 Acute bronchitis, unspecified: Secondary | ICD-10-CM

## 2023-09-17 DIAGNOSIS — R630 Anorexia: Secondary | ICD-10-CM | POA: Diagnosis not present

## 2023-09-17 DIAGNOSIS — E7800 Pure hypercholesterolemia, unspecified: Secondary | ICD-10-CM

## 2023-09-17 DIAGNOSIS — I10 Essential (primary) hypertension: Secondary | ICD-10-CM

## 2023-09-17 DIAGNOSIS — J302 Other seasonal allergic rhinitis: Secondary | ICD-10-CM | POA: Diagnosis not present

## 2023-09-17 DIAGNOSIS — G4700 Insomnia, unspecified: Secondary | ICD-10-CM

## 2023-09-17 DIAGNOSIS — J440 Chronic obstructive pulmonary disease with acute lower respiratory infection: Secondary | ICD-10-CM

## 2023-09-17 DIAGNOSIS — Z720 Tobacco use: Secondary | ICD-10-CM

## 2023-09-17 DIAGNOSIS — R000 Tachycardia, unspecified: Secondary | ICD-10-CM | POA: Diagnosis not present

## 2023-09-17 DIAGNOSIS — E1169 Type 2 diabetes mellitus with other specified complication: Secondary | ICD-10-CM

## 2023-09-17 MED ORDER — AZITHROMYCIN 250 MG PO TABS: ORAL_TABLET | ORAL | 0 refills | Status: AC

## 2023-09-17 NOTE — Progress Notes (Signed)
 Patient Office Visit  Assessment & Plan:  Tachycardia -     CBC with Differential/Platelet -     Comprehensive metabolic panel with GFR -     Lipid panel -     TSH -     Magnesium  -     EKG 12-Lead  Seasonal allergies  Insomnia, unspecified type  Decrease in appetite -     CBC with Differential/Platelet -     TSH  Acute bronchitis with COPD (HCC) -     DG Chest 2 View; Future -     Azithromycin ; Take 2 tablets on day 1, then 1 tablet daily on days 2 through 5  Dispense: 6 tablet; Refill: 0  Type 2 diabetes mellitus with other specified complication, without long-term current use of insulin  (HCC) -     Hemoglobin A1c  Pure hypercholesterolemia -     Lipid panel  Primary hypertension -     CBC with Differential/Platelet -     Comprehensive metabolic panel with GFR  Tobacco use   Assessment and Plan    Chronic obstructive pulmonary disease (COPD) with recent pulmonary nodule, possible bronchitis COPD managed with Breztri and albuterol . Coughing with phlegm and throat irritation. - Continue Breztri twice daily. Z pack sent to pharmacy. ordered CXR at [facility] today - Use albuterol  as needed. - Continue Zyrtec and Flonase  for allergy management.  Lung nodule, left upper lobe, under surveillance 9 mm nodule in the left upper lobe under surveillance. High risk for lung cancer due to smoking history. - Follow up with CT scan in November with contrast.  Essential hypertension Hypertension managed with valsartan , amlodipine , and spironolactone . Recent tachycardia noted without palpitations or chest pain. - Continue valsartan , amlodipine , and spironolactone  as prescribed. EKG- tachycardic, no acute changes noted (pt aware)  Hypercholesterolemia Hypercholesterolemia managed with current medication regimen.  Tobacco use disorder Long-term smoker, currently smoking a pack a day. Plans to quit with Chantix  starting September 3rd. High risk for lung cancer due to smoking  history. - Start Chantix  on September 3rd.  Insomnia Insomnia managed with mirtazapine , which appears effective. - Continue mirtazapine  for insomnia.  Seasonal allergic rhinitis Allergic rhinitis managed with Zyrtec and Flonase . Reports throat irritation. - Continue Zyrtec and Flonase .  Tachycardia Recent tachycardia with heart rate at 130-135 bpm-Heart rate reduced to 120 in office. Baseline HR is in the 100 range. No palpitations or shortness of breath. patient has upcoming cardiology appt in [facility] - Monitor heart rate and symptoms. patient aware that if symptoms worsen she needs to go to ER - Follow up with cardiologist next week.  Anorexia (loss of appetite) Loss of appetite for about a year with weight fluctuation. Recent weight loss from 141 lbs in June to 136 lbs.     EKG-sinus tachycardia 120 no acute changes noted.  Patient aware of this.  Follow-up on lab work and chest x-ray results today.  Patient is aware if worsening symptoms over the weekend she will go to the ER.  Patient will do a COVID test at home and notify us  of the results.  Told her we cannot use prednisone  since she is having tachycardia.  Recommend tobacco sensation.  Return in 3 to 4 months or sooner if necessary.  Return in about 3 months (around 12/18/2023), or if symptoms worsen or fail to improve.   Subjective:    Patient ID: Stacy [name], female    DOB: 02-May-1965  Age: 58 y.o. MRN: [id number]  Chief Complaint  Patient presents with   Insomnia    Pt c/o of insomnia for 2 years.   Anorexia    Pt c/o of loss of appetite for 2 years.   Allergies    Pt c/o of allergies that are worsening over the last few months.    Insomnia   Discussed the use of AI scribe software for clinical note transcription with the patient, who gave verbal consent to proceed.  History of Present Illness        History of Present Illness Stacy [name] is a 58 year old female with COPD who presents with throat pain and  cough for over one week.   She has been experiencing sore throat pain and a persistent productive cough with phlegm. The throat feels sore, which is unusual despite having year-round allergies. No fever is present, but she mentions night sweats. She has not been exposed to COVID-19 to her knowledge and retains her sense of smell and taste. The cough is accompanied by a sensation of drainage in the back of her throat, becomes more pronounced upon waking, and continues throughout the day.  She has a history of COPD and is currently using Breztri twice daily and albuterol  as needed, with no recent increase in albuterol  use. She also takes Zyrtec and Flonase  for allergies, with a prescription for Flonase  and Astelin . She denies taking over-the-counter medications like Sudafed or Afrin nasal spray.  She reports a loss of appetite over the past year, eating approximately one meal a day, which she considers normal. Her weight has fluctuated, with a recent weight loss noted. She has been experiencing insomnia for two years and is taking mirtazapine , which she believes is helping. Type 2 diabetes-previously controlled.  Denies diarrhea, peripheral swelling, hypoglycemia, excessive thirst, excessive urination, visual fluctuation, and fatigue.  Making an effort on diet control and exercise.  Has an up-to-date dilated eye exam.  Using medication as prescribed without difficulty.   She smokes a pack a day and has a history of smoking for over twenty years. A recent lung cancer screening revealed a 9 mm nodule in the left upper lobe, with a follow-up scheduled in November.  She has a history of hypertension. She is not aware of experiencing palpitations. She takes Diovan  (valsartan ), Norvasc , Aldactone , and a cholesterol medication daily. No awareness of palpitations and has not been prescribed specific medication for them. She reports shortness of breath when climbing stairs, especially when carrying items, but denies  chest pain. The intermittent shortness of breath is not new to her.   She mentions a change in her stool consistency, noting it is not solid and sometimes dark brown, which has been occurring for about a week. No alcohol or drug use, though she drinks socially on occasion.  Physical Exam VITALS: P- 135, SaO2- 96% MEASUREMENTS: Weight- 136 lbs.  Results RADIOLOGY Lung CT scan: Left upper lobe 9 mm nodule (09/07/2023) Mammogram: Dense breasts, no evidence of malignancy (08/2023)  DIAGNOSTIC Carotid Doppler: Normal (04/2023) EKG: Normal (04/2023)  Assessment and Plan Chronic obstructive pulmonary disease (COPD) with recent pulmonary nodule, possible bronchitis COPD managed with Breztri and albuterol . Coughing with phlegm and throat irritation. - Continue Breztri twice daily. Z pack sent to pharmacy. ordered CXR at [facility] today - Use albuterol  as needed. - Continue Zyrtec and Flonase  for allergy management.  Lung nodule, left upper lobe, under surveillance 9 mm nodule in the left upper lobe under surveillance. High risk for lung cancer due to smoking history. -  Follow up with CT scan in November with contrast.  Essential hypertension Hypertension managed with valsartan , amlodipine , and spironolactone . Recent tachycardia noted without palpitations or chest pain. - Continue valsartan , amlodipine , and spironolactone  as prescribed. EKG- tachycardic, no acute changes noted (pt aware)  Hypercholesterolemia Hypercholesterolemia managed with current medication regimen.  Tobacco use disorder Long-term smoker, currently smoking a pack a day. Plans to quit with Chantix  starting September 3rd. High risk for lung cancer due to smoking history. - Start Chantix  on September 3rd.  Insomnia Insomnia managed with mirtazapine , which appears effective. - Continue mirtazapine  for insomnia. Seasonal allergic rhinitis Allergic rhinitis managed with Zyrtec and Flonase . Reports throat irritation. -  Continue Zyrtec and Flonase .  Tachycardia Recent tachycardia with heart rate at 130-135 bpm-Heart rate reduced to 120 in office. Baseline HR is in the 100 range. No palpitations or shortness of breath. patient has upcoming cardiology appt in [facility] - Monitor heart rate and symptoms. patient aware that if symptoms worsen she needs to go to ER - Follow up with cardiologist next week.  Anorexia (loss of appetite) Loss of appetite for about a year with weight fluctuation. Recent weight loss from 141 lbs in June to 136 lbs.  The 10-year ASCVD risk score (Arnett DK, et al., 2019) is: 23%  Past Medical History:  Diagnosis Date   Acute bronchitis 08/02/2019   Acute deep vein thrombosis (DVT) of femoral vein of right lower extremity (HCC) 04/07/2021   Adrenal adenoma    as per MRI 03/09/14   AKI (acute kidney injury) (HCC) 12/20/2020   Allergy    Anxiety    Coronary artery calcification seen on CAT scan 04/12/2018   Depression    Diabetes mellitus    Diverticulosis    Elevated LFTs    elevated since 09/2013   Fatty liver    moderate- as per 03/06/17 MRI liver   GERD (gastroesophageal reflux disease)    Gout    H/O hiatal hernia    Headache(784.0)    History of depression    Hyperlipemia    Hyperplastic colon polyp    Hypertension    Hypoglycemia 12/19/2021   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 Tobie 02/27/2014   Tobacco abuse    Tubular adenoma of colon    Past Surgical History:  Procedure Laterality Date   ANAL FISTULECTOMY  01/19/1985   BALLOON DILATION N/A 12/23/2021   Procedure: BALLOON DILATION;  Surgeon: Avram Lupita BRAVO, MD;  Location: [facility];  Service: Gastroenterology;  Laterality: N/A;   BIOPSY  12/23/2021   Procedure: BIOPSY;  Surgeon: Avram Lupita BRAVO, MD;  Location: [facility];  Service: Gastroenterology;;    BREAST EXCISIONAL BIOPSY Left    BREAST LUMPECTOMY Left 1988   COLONOSCOPY WITH PROPOFOL  N/A 12/23/2021   Procedure: COLONOSCOPY WITH PROPOFOL ;  Surgeon: Avram Lupita BRAVO, MD;  Location: [facility];  Service: Gastroenterology;  Laterality: N/A;   ESOPHAGOGASTRODUODENOSCOPY (EGD) WITH PROPOFOL  N/A 12/23/2021   Procedure: ESOPHAGOGASTRODUODENOSCOPY (EGD) WITH PROPOFOL ;  Surgeon: Avram Lupita BRAVO, MD;  Location: [facility];  Service: Gastroenterology;  Laterality: N/A;   PAROTIDECTOMY  03/30/2011   Procedure: PAROTIDECTOMY;  Surgeon: Ida Loader, MD;  Location: [facility];  Service: ENT;  Laterality: Right;   WRIST GANGLION EXCISION Left 1988   Social History   Tobacco Use  Smoking status: Every Day    Current packs/day: 1.00    Average packs/day: 1 pack/day for 28.0 years (28.0 ttl pk-yrs)    Types: Cigarettes   Smokeless tobacco: Never  Vaping Use   Vaping status: Never Used  Substance Use Topics   Alcohol use: Yes    Alcohol/week: 14.0 standard drinks of alcohol    Types: 14 Standard drinks or equivalent per week    Comment: occ   Drug use: No   Family History  Problem Relation Age of Onset   Hypertension Mother    Stroke Mother    Coronary artery disease Father 4       CAD,CABG   Diabetes Sister        uncontrolled   Cancer Sister 24       uterine cancer   Aneurysm Sister        brain   Heart attack Maternal Grandmother    Blindness Paternal Grandmother    Breast cancer Neg Hx    Colon cancer Neg Hx    Esophageal cancer Neg Hx    Rectal cancer Neg Hx    Stomach cancer Neg Hx    Allergies  Allergen Reactions   Almond Meal (Obsolete) Anaphylaxis and Swelling   Other Anaphylaxis    Almond Butter   Aspirin  Nausea Only    Other Reaction(s): Not available, Unknown    Review of Systems  Psychiatric/Behavioral:  The patient has insomnia.       Objective:    BP (!) 140/92   Pulse (!) 129   Temp 99.3 F (37.4 C)   Ht 5' 3 (1.6 m)   Wt 136 lb  2 oz (61.7 kg)   SpO2 96%   BMI 24.11 kg/m  BP Readings from Last 3 Encounters:  09/17/23 (!) 140/92  07/06/23 124/80  02/09/23 132/88   Wt Readings from Last 3 Encounters:  09/17/23 136 lb 2 oz (61.7 kg)  07/06/23 141 lb 4 oz (64.1 kg)  02/09/23 130 lb 11.2 oz (59.3 kg)    Physical Exam Vitals and nursing note reviewed.  Constitutional:      General: She is not in acute distress.    Appearance: Normal appearance.  HENT:     Head: Normocephalic.     Right Ear: Tympanic membrane, ear canal and external ear normal.     Left Ear: Tympanic membrane, ear canal and external ear normal.     Nose: Congestion present.     Mouth/Throat:     Pharynx: No oropharyngeal exudate or posterior oropharyngeal erythema.     Comments: Strong gag reflex, patient has postnasal drip Eyes:     Extraocular Movements: Extraocular movements intact.     Pupils: Pupils are equal, round, and reactive to light.  Cardiovascular:     Rate and Rhythm: Regular rhythm. Tachycardia present.     Heart sounds: Normal heart sounds.  Pulmonary:     Effort: Pulmonary effort is normal.     Breath sounds: Normal breath sounds.  Musculoskeletal:     Right lower leg: No edema.     Left lower leg: No edema.  Neurological:     General: No focal deficit present.     Mental Status: She is alert and oriented to person, place, and time.  Psychiatric:        Mood and Affect: Mood normal.        Behavior: Behavior normal.        Thought Content: Thought content normal.  Judgment: Judgment normal.      No results found for any visits on 09/17/23.

## 2023-09-17 NOTE — Progress Notes (Signed)
 Pt informed and verbalized understanding

## 2023-09-18 LAB — LIPID PANEL
Cholesterol: 170 mg/dL (ref <200)
HDL: 101 mg/dL (ref >=50)
LDL Cholesterol (Calc): 48 mg/dL
Non-HDL Cholesterol (Calc): 69 mg/dL (ref <130)
Total CHOL/HDL Ratio: 1.7 (calc) (ref <5.0)
Triglycerides: 119 mg/dL (ref <150)

## 2023-09-18 LAB — COMPREHENSIVE METABOLIC PANEL WITH GFR
AG Ratio: 1.2 (calc) (ref 1.0–2.5)
ALT: 39 U/L — ABNORMAL HIGH (ref 6–29)
AST: 39 U/L — ABNORMAL HIGH (ref 10–35)
Albumin: 3.7 g/dL (ref 3.6–5.1)
Alkaline phosphatase (APISO): 111 U/L (ref 37–153)
BUN: 8 mg/dL (ref 7–25)
CO2: 30 mmol/L (ref 20–32)
Calcium: 10 mg/dL (ref 8.6–10.4)
Chloride: 98 mmol/L (ref 98–110)
Creat: 0.63 mg/dL (ref 0.50–1.03)
Globulin: 3.2 g/dL (ref 1.9–3.7)
Glucose, Bld: 248 mg/dL — ABNORMAL HIGH (ref 65–99)
Potassium: 3.8 mmol/L (ref 3.5–5.3)
Sodium: 137 mmol/L (ref 135–146)
Total Bilirubin: 0.6 mg/dL (ref 0.2–1.2)
Total Protein: 6.9 g/dL (ref 6.1–8.1)
eGFR: 103 mL/min/1.73m2 (ref >=60)

## 2023-09-18 LAB — CBC WITH DIFFERENTIAL/PLATELET
Absolute Lymphocytes: 1717 {cells}/uL (ref 850–3900)
Absolute Monocytes: 606 {cells}/uL (ref 200–950)
Basophils Absolute: 51 {cells}/uL (ref 0–200)
Basophils Relative: 0.5 %
Eosinophils Absolute: 51 {cells}/uL (ref 15–500)
Eosinophils Relative: 0.5 %
HCT: 38.6 % (ref 35.0–45.0)
Hemoglobin: 13 g/dL (ref 11.7–15.5)
MCH: 29.3 pg (ref 27.0–33.0)
MCHC: 33.7 g/dL (ref 32.0–36.0)
MCV: 86.9 fL (ref 80.0–100.0)
MPV: 11 fL (ref 7.5–12.5)
Monocytes Relative: 6 %
Neutro Abs: 7676 {cells}/uL (ref 1500–7800)
Neutrophils Relative %: 76 %
Platelets: 182 Thousand/uL (ref 140–400)
RBC: 4.44 Million/uL (ref 3.80–5.10)
RDW: 14.6 % (ref 11.0–15.0)
Total Lymphocyte: 17 %
WBC: 10.1 Thousand/uL (ref 3.8–10.8)

## 2023-09-18 LAB — HEMOGLOBIN A1C
Hgb A1c MFr Bld: 8.6 % — ABNORMAL HIGH (ref <5.7)
Mean Plasma Glucose: 200 mg/dL
eAG (mmol/L): 11.1 mmol/L

## 2023-09-18 LAB — TSH: TSH: 0.78 m[IU]/L (ref 0.40–4.50)

## 2023-09-18 LAB — MAGNESIUM: Magnesium: 1.2 mg/dL — ABNORMAL LOW (ref 1.5–2.5)

## 2023-09-21 ENCOUNTER — Other Ambulatory Visit: Payer: Self-pay | Admitting: Critical Care Medicine

## 2023-09-30 NOTE — Progress Notes (Signed)
 Patient name: Stacy [name]  Date of birth: 1965-08-04 Date of visit: 09/30/2023 Referring Provider: No ref. provider found   Primary Care Provider: Gwenn SHAUNNA Shams, MD                                               Portions of this note were dictated using DRAGON voice recognition software. Please disregard any errors in transcription.  This record has been created using Conservation officer, historic buildings. Errors have been sought and corrected, but may not always be located. Such creation errors do not reflect on the standard of medical care.   Chief Complaint  Patient presents with  . Follow-up    Impression /Plan:   1.  Coronary artery disease mild noted on CCTA 2021.  She has had no angina.  Continue aggressive risk factor modification.   2.  History of renal artery stenosis diagnosed 12/08/2021 there was noted to be of 1 to 59% stenosis but stable compared to her previous ones.  She is not having any issues with hypertension.  Therefore we will just continue to monitor.  3.  History of sinus tachycardia asymptomatic heart rate seem to be elevated I will order a monitor just for 1 week. 4.  Questionable carotid artery disease has no bruit. 5.  Night sweats workup for this was negative per PCP 6.  Diabetes mellitus managed by PCP 7.  Dyslipidemia on atorvastatin  8.  Possible obstructive sleep apnea.  History Present Illness:   Patient for follow-up open doing well no specific new complaint denies any chest pain or shortness of breath orthopnea PND or syncope.  Workup for her nitrate so far has been negative.  She is managed by her PCP they are not as intense as they were in the past.  She did have a carotid ultrasound since her last ROS was unremarkable.  She is having no chest pain or angina.  Past Medical History:  Medical History[1]   Social History:   Social History   Socioeconomic History  . Marital status: Widowed    Spouse name: Not on file  . Number of children:  Not on file  . Years of education: Not on file  . Highest education level: Not on file  Occupational History  . Not on file  Tobacco Use  . Smoking status: Every Day    Current packs/day: 1.00    Average packs/day: 1 pack/day for 24.7 years (24.7 ttl pk-yrs)    Types: Cigarettes    Start date: 2001  . Smokeless tobacco: Never  . Tobacco comments:    Per 2025 LS form/chart (1)  Substance and Sexual Activity  . Alcohol use: Yes    Alcohol/week: 35.0 standard drinks of alcohol  . Drug use: No    Comment: Drug use: Denies  . Sexual activity: Not on file  Other Topics Concern  . Not on file  Social History Narrative  . Not on file   Social Drivers of Health   Food Insecurity: No Food Insecurity (12/29/2022)   [facility]   Food vital sign   . Within the past 12 months, you worried that your food would run out before you got money to buy more: Never true   . Within the past 12 months, the food you bought just didn't last and you didn't have money to get  more: Never true  Transportation Needs: No Transportation Needs (12/29/2022)   Received from [facility] - Transportation   . Lack of Transportation (Medical): No   . Lack of Transportation (Non-Medical): No  Safety: Not At Risk (12/20/2021)   Received from [facility]   Safety   . Within the last year, have you been afraid of your partner or ex-partner?: No   . Within the last year, have you been humiliated or emotionally abused in other ways by your partner or ex-partner?: No   . Within the last year, have you been kicked, hit, slapped, or otherwise physically hurt by your partner or ex-partner?: No   . Within the last year, have you been raped or forced to have any kind of sexual activity by your partner or ex-partner?: No  Living Situation: Not on file    Family History :  Family History[2]   Current Medications: Current Medications[3]  Allergies:   Allergies[4]   Review Of  Systems:   12point review of systems were completed with the patient all pertinent positives and negatives are located in the history of present illness or as outlined in the initial intake sheet.  Vital Signs:  BP 108/71 (BP Location: Right arm, Patient Position: Sitting)   Pulse 108   Resp 18   Ht 1.6 m (5' 3)   Wt 63.4 kg (139 lb 11.2 oz)   LMP  (LMP Unknown)   SpO2 98%   BMI 24.75 kg/m  Wt Readings from Last 3 Encounters:  09/30/23 63.4 kg (139 lb 11.2 oz)  09/07/23 60.7 kg (133 lb 14.4 oz)  06/23/23 62.7 kg (138 lb 4.8 oz)    Physical Examination Constitutional: See Vital Signs                          No Acute Distress Normal Body Habitus and development  Eyes:  Normal conjunctivae Ears Nose and Throat: Normal  Neck: No significant jugular venous distension.  Thyroid  does not appear enlarged visually Respiratory:   No increased work of breathing; CTBA; No wheezes rhonchior rales  Cardiovascular Exam:   No palpable thrills or lifts, PMI non displaced.  S1 S2   regular rate and  rhythm   No murmurs;  No rubs or gallop.   Normal Carotid upstroke, NoCarotid Bruits  No abnormal abdominal pulsation or bruits  Pedal/radial Pulses - present  No significant edema or varicosities   Abdomen  Soft non tender;  non distended, + bowel sounds, Noobvious HSM or masses Ext:  No clubbing or cyanosis  MSK:  Generally normal strength;   Neuro:  Alert and Oriented x 2  Grossly normal non focal, Normal mood and affect Psych:  Normal Mood and affect Skin: No rash   EKG:    Diagnostic Studies:  Lab Results  Component Value Date   WBC 7.10 05/11/2023   HGB 13.2 05/11/2023   HCT 39.0 05/11/2023   PLT 261 05/11/2023    Lab Results  Component Value Date   NA 138 05/11/2023   K 3.6 05/11/2023   CL 103 05/11/2023   CO2 27 05/11/2023   BUN 16 05/11/2023   CREATININE 0.69 05/11/2023   GLUCOSE 173 (H) 05/11/2023   CALCIUM  9.6 05/11/2023    Lab Results  Component Value Date   BILITOT  0.4 05/11/2023   PROT 7.4 05/11/2023   ALBUMIN 4.1 05/11/2023   ALT 23 05/11/2023   AST 30 05/11/2023  ALP 88 05/11/2023    No results found for: LABPROT, INR, PTT  Lab Results  Component Value Date   CHOL 164 05/11/2023   Lab Results  Component Value Date   HDL 74 05/11/2023   Lab Results  Component Value Date   LDLCALC 73 05/11/2023   Lab Results  Component Value Date   TRIG 86 05/11/2023   Lab Results  Component Value Date   HDL 74 05/11/2023     I personally reviewed the most recent Echocardiogram, ECG, Cardiac MRI, Nuclear Studies and Catherization Films pertinent to this visit     Thank you for allowing me to participate in the care of your patient.  Please feel free to contact me ifyou need any further information.     Zahid Junagadhwalla MD  [phone]        [1] No past medical history on file. [2] Family History Problem Relation Name Age of Onset  . Diabetes type II Brother    . Diabetes type II Sister    . Hypertension Mother    [3] Current Outpatient Medications  Medication Sig Dispense Refill  . albuterol  HFA (PROVENTIL  HFA;VENTOLIN  HFA;PROAIR  HFA) 90 mcg/actuation inhaler Inhale.    . allopurinoL  (ZYLOPRIM ) 100 mg tablet     . amLODIPine  (NORVASC ) 5 mg tablet     . atorvastatin  (LIPITOR) 10 mg tablet Take 10 mg by mouth daily.    . colchicine  0.6 mg tablet     . empagliflozin -metFORMIN  (Synjardy  XR) 5-1,000 mg TBph Take 2 tablets by mouth daily.    . FLUoxetine  (PROzac ) 40 mg capsule     . glimepiride  (AMARYL ) 1 mg tablet     . glycopyrrolate -formoteroL (Bevespi Aerosphere) 9-4.8 mcg inhaler Inhale 2 puffs 2 (two) times a day. 1062 g 11  . hydrOXYzine (ATARAX) 25 mg tablet Take 25 mg by mouth.    . metFORMIN  (GLUCOPHAGE ) 850 mg tablet     . mirtazapine  (REMERON ) 15 mg tablet     . mv,Ca,min-iron-FA-guarana-caff (One-A-Day Women's Active) 18 mg iron- 400 mcg-180 mg tab Take 1 each by mouth.    . pantoprazole  (PROTONIX ) 40  mg EC tablet     . pioglitazone  (ACTOS ) 15 mg tablet     . rosuvastatin  (CRESTOR ) 20 mg tablet Take 20 mg by mouth daily.    . spironolactone  (ALDACTONE ) 50 mg tablet     . valsartan -hydroCHLOROthiazide  320-25 mg tab Take 1 tablet by mouth daily.    . varenicline  tartrate (CHANTIX ) 0.5 mg tablet Take 0.5 mg by mouth.     No current facility-administered medications for this visit.  [4] Allergies Allergen Reactions  . Almond Oil Swelling  . Aspirin  GI Intolerance    Sick on stomach

## 2023-10-06 ENCOUNTER — Encounter: Payer: Self-pay | Admitting: Family Medicine

## 2023-10-06 ENCOUNTER — Ambulatory Visit: Admitting: Family Medicine

## 2023-10-06 VITALS — BP 124/76 | HR 92 | Temp 98.7°F | Ht 63.0 in | Wt 141.0 lb

## 2023-10-06 DIAGNOSIS — Z716 Tobacco abuse counseling: Secondary | ICD-10-CM

## 2023-10-06 DIAGNOSIS — K760 Fatty (change of) liver, not elsewhere classified: Secondary | ICD-10-CM

## 2023-10-06 DIAGNOSIS — I2510 Atherosclerotic heart disease of native coronary artery without angina pectoris: Secondary | ICD-10-CM

## 2023-10-06 DIAGNOSIS — Z23 Encounter for immunization: Secondary | ICD-10-CM

## 2023-10-06 DIAGNOSIS — I10 Essential (primary) hypertension: Secondary | ICD-10-CM | POA: Diagnosis not present

## 2023-10-06 DIAGNOSIS — E785 Hyperlipidemia, unspecified: Secondary | ICD-10-CM

## 2023-10-06 DIAGNOSIS — E1169 Type 2 diabetes mellitus with other specified complication: Secondary | ICD-10-CM

## 2023-10-06 DIAGNOSIS — Z7984 Long term (current) use of oral hypoglycemic drugs: Secondary | ICD-10-CM

## 2023-10-06 NOTE — Progress Notes (Signed)
 Patient Office Visit  Assessment & Plan:  Type 2 diabetes mellitus with other specified complication, without long-term current use of insulin  (HCC)  Encounter for tobacco use cessation counseling  Primary hypertension  Coronary artery calcification seen on CAT scan  Fatty liver  Hyperlipidemia, unspecified hyperlipidemia type  Immunization due -     Flu vaccine trivalent PF, 6mos and older(Flulaval,Afluria,Fluarix,Fluzone)   Assessment and Plan    Type 2 diabetes mellitus Previously elevated blood sugars, now controlled with metformin . - Continue metformin  2 tablets at night. - Consider increasing metformin  to 4 tablets if needed.  Nicotine  dependence, cigarettes Ongoing smoking with nicotine  patch use. Discussed Chantix , no contraindications, potential side effects reviewed. Encouraged smoking cessation to reduce stroke risk. - Continue using nicotine  patch. - Encourage smoking cessation to reduce risk of stroke.  Sinus tachycardia Improved pulse from 129 to 92. Cardiologist recommended heart monitor. Possible contribution from recent illness and smoking. - Wear heart monitor for one week as recommended by cardiologist. - Follow up with cardiologist in six months.  Hyperlipidemia Improved cholesterol levels. Cardiologist advised continuing current medication. - Continue current cholesterol medication.  Hypomagnesemia Previously low magnesium  levels, taking over-the-counter magnesium . - Recheck magnesium  levels at next appointment.  Allergic rhinitis Managed with nasal spray and eye drops during allergy season. - Continue nasal spray and eye drops for allergy management.  General Health Maintenance Up to date on mammograms and colonoscopies. - Administer flu shot today.      Flu shot today.  Recommend tobacco cessation.  Patient will take the Chantix .  Patient has follow-up appointment in December.  [facility] including the recent  carotid ultrasound and cardiology notes from [facility].  Return if symptoms worsen or fail to improve.   Subjective:    Patient ID: Stacy [name], female    DOB: 13-Jan-1966  Age: 58 y.o. MRN: [id number]  Chief Complaint  Patient presents with   Medical Management of Chronic Issues    HPI Discussed the use of AI scribe software for clinical note transcription with the patient, who gave verbal consent to proceed.  History of Present Illness        Stacy [name] is a 58 year old female with type 2 diabetes and hyperlipidemia, COPD, tobacco use who presents for follow-up after recent illness and medication adjustments  She feels much better since her last visit, although her voice has not completely returned. She attributes her improvement to the antibiotics she was prescribed.  She has resumed taking metformin , 2 tablets at night, after previously running out of her medication. Her blood sugar levels were previously high, but she is now back on track with her medication regimen.  Her cholesterol levels have improved, and her liver tests have shown significant improvement. She is taking over-the-counter magnesium  supplements due to previously low magnesium  levels and has ordered magnesium  complex for future use.  She recently saw a cardiologist at [facility] for tachycardia and for a follow-up after renal artery stenosis and carotid ultrasound, which showed no issues. Carotid ultrasound was done August 13th at [facility] She is awaiting authorization for a sleep study.  She smokes and is using a nicotine  patch to reduce smoking. She has not started Chantix  due to concerns about interactions with her current medications, which include Lexapro , Vraylar, and Atarax.  She reports seeing 'suds' in her urine but denies any pain or blood in the urine. She acknowledges the need to increase her water intake.  She reports that her  cardiologist wants her to wear a heart monitor for a week because her pulse  was elevated at her last visit. She does not feel her heart racing and attributes previous high pulse rates to being unwell at the time.  She is taking a nasal spray and eye drops for allergies, which are currently in season. Physical Exam VITALS: P- 92 CARDIOVASCULAR: Heart rate 92 bpm, previously 129 bpm. Results RADIOLOGY Carotid artery ultrasound: No abnormalities detected (09/30/2023)  DIAGNOSTIC Sinus tachycardia: Heart rate 129 bpm, improved to 92 bpm Assessment & Plan Type 2 diabetes mellitus Previously elevated blood sugars, now controlled with metformin . - Continue metformin  2 tablets at night. - Consider increasing metformin  to 4 tablets if needed.  Nicotine  dependence, cigarettes Ongoing smoking with nicotine  patch use. Discussed Chantix , no contraindications, potential side effects reviewed. Encouraged smoking cessation to reduce stroke risk. - Continue using nicotine  patch. - Encourage smoking cessation to reduce risk of stroke.  Sinus tachycardia Improved pulse from 129 to 92. Cardiologist recommended heart monitor. Possible contribution from recent illness and smoking. - Wear heart monitor for one week as recommended by cardiologist. - Follow up with cardiologist in six months.  Hyperlipidemia Improved cholesterol levels. Cardiologist advised continuing current medication. - Continue current cholesterol medication.  Hypomagnesemia Previously low magnesium  levels, taking over-the-counter magnesium . - Recheck magnesium  levels at next appointment.  Allergic rhinitis Managed with nasal spray and eye drops during allergy season. - Continue nasal spray and eye drops for allergy management.  General Health Maintenance Up to date on mammograms and colonoscopies. - Administer flu shot today.    The ASCVD Risk score (Arnett DK, et al., 2019) failed to calculate for the following reasons:   The valid HDL cholesterol range is 20 to 100 mg/dL  Past Medical History:   Diagnosis Date   Acute bronchitis 08/02/2019   Acute deep vein thrombosis (DVT) of femoral vein of right lower extremity (HCC) 04/07/2021   Adrenal adenoma    as per MRI 03/09/14   AKI (acute kidney injury) (HCC) 12/20/2020   Allergy    Anxiety    Coronary artery calcification seen on CAT scan 04/12/2018   Depression    Diabetes mellitus    Diverticulosis    Elevated LFTs    elevated since 09/2013   Fatty liver    moderate- as per 03/06/17 MRI liver   GERD (gastroesophageal reflux disease)    Gout    H/O hiatal hernia    Headache(784.0)    History of depression    Hyperlipemia    Hyperplastic colon polyp    Hypertension    Hypoglycemia 12/19/2021   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 Tobie 02/27/2014   Tobacco abuse    Tubular adenoma of colon    Past Surgical History:  Procedure Laterality Date   ANAL FISTULECTOMY  01/19/1985   BALLOON DILATION N/A 12/23/2021   Procedure: BALLOON DILATION;  Surgeon: Avram Lupita BRAVO, MD;  Location: [facility];  Service: Gastroenterology;  Laterality: N/A;   BIOPSY  12/23/2021   Procedure: BIOPSY;  Surgeon: Avram Lupita BRAVO, MD;  Location: [facility];  Service: Gastroenterology;;   BREAST EXCISIONAL BIOPSY Left    BREAST LUMPECTOMY Left 1988   COLONOSCOPY WITH PROPOFOL  N/A 12/23/2021   Procedure: COLONOSCOPY WITH PROPOFOL ;  Surgeon: Avram Lupita BRAVO, MD;  Location: [facility];  Service: Gastroenterology;  Laterality: N/A;   ESOPHAGOGASTRODUODENOSCOPY (EGD) WITH PROPOFOL  N/A 12/23/2021   Procedure: ESOPHAGOGASTRODUODENOSCOPY (EGD) WITH PROPOFOL ;  Surgeon: Avram Lupita BRAVO, MD;  Location: [facility];  Service: Gastroenterology;  Laterality: N/A;   PAROTIDECTOMY  03/30/2011   Procedure: PAROTIDECTOMY;  Surgeon: Ida Loader, MD;  Location: [facility];  Service: ENT;  Laterality:  Right;   WRIST GANGLION EXCISION Left 1988   Social History   Tobacco Use   Smoking status: Every Day    Current packs/day: 1.00    Average packs/day: 1 pack/day for 28.0 years (28.0 ttl pk-yrs)    Types: Cigarettes   Smokeless tobacco: Never  Vaping Use   Vaping status: Never Used  Substance Use Topics   Alcohol use: Yes    Alcohol/week: 14.0 standard drinks of alcohol    Types: 14 Standard drinks or equivalent per week    Comment: occ   Drug use: No   Family History  Problem Relation Age of Onset   Hypertension Mother    Stroke Mother    Coronary artery disease Father 30       CAD,CABG   Diabetes Sister        uncontrolled   Cancer Sister 81       uterine cancer   Aneurysm Sister        brain   Heart attack Maternal Grandmother    Blindness Paternal Grandmother    Breast cancer Neg Hx    Colon cancer Neg Hx    Esophageal cancer Neg Hx    Rectal cancer Neg Hx    Stomach cancer Neg Hx    Allergies  Allergen Reactions   Almond Meal (Obsolete) Anaphylaxis and Swelling   Other Anaphylaxis    Almond Butter   Aspirin  Nausea Only    Other Reaction(s): Not available, Unknown    ROS    Objective:    BP 124/76   Pulse 92   Temp 98.7 F (37.1 C)   Ht 5' 3 (1.6 m)   Wt 141 lb (64 kg)   SpO2 97%   BMI 24.98 kg/m  BP Readings from Last 3 Encounters:  10/06/23 124/76  09/17/23 (!) 140/92  07/06/23 124/80   Wt Readings from Last 3 Encounters:  10/06/23 141 lb (64 kg)  09/17/23 136 lb 2 oz (61.7 kg)  07/06/23 141 lb 4 oz (64.1 kg)    Physical Exam Vitals and nursing note reviewed.  Constitutional:      General: She is not in acute distress.    Appearance: Normal appearance.  HENT:     Head: Normocephalic.     Right Ear: Tympanic membrane, ear canal and external ear normal.     Left Ear: Tympanic membrane, ear canal and external ear normal.  Eyes:     Extraocular Movements: Extraocular movements intact.     Conjunctiva/sclera: Conjunctivae normal.      Pupils: Pupils are equal, round, and reactive to light.  Cardiovascular:     Rate and Rhythm: Regular rhythm. Tachycardia present.     Heart sounds: Normal heart sounds.     Comments: Tachycardia has improved Pulmonary:     Effort: Pulmonary effort is normal.     Breath sounds: Normal breath sounds. No wheezing or rhonchi.  Musculoskeletal:     Right lower leg: No edema.     Left lower leg: No edema.  Neurological:     General: No focal deficit present.  Mental Status: She is alert and oriented to person, place, and time.  Psychiatric:        Mood and Affect: Mood normal.        Behavior: Behavior normal.        Thought Content: Thought content normal.        Judgment: Judgment normal.      No results found for any visits on 10/06/23.

## 2023-10-11 ENCOUNTER — Other Ambulatory Visit: Payer: Self-pay | Admitting: Family Medicine

## 2023-10-11 NOTE — Telephone Encounter (Unsigned)
 Copied from CRM [id number]. Topic: Clinical - Medication Refill >> Oct 11, 2023 12:48 PM Charlet HERO wrote: Medication: allopurinol  (ZYLOPRIM ) 300 MG tablet metFORMIN  (GLUCOPHAGE -XR) 500 MG 24 hr tablet Has the patient contacted their pharmacy? Yes 0 refills  This is the patient's preferred pharmacy:  [id number] [place], [place] - [address] [place] [address] Phone: [phone] Fax: [phone]    Is this the correct pharmacy for this prescription? Yes If no, delete pharmacy and type the correct one.   Has the prescription been filled recently? Yes  Is the patient out of the medication? Yes  Has the patient been seen for an appointment in the last year OR does the patient have an upcoming appointment? Yes  Can we respond through MyChart? Yes  Agent: Please be advised that Rx refills may take up to 3 business days. We ask that you follow-up with your pharmacy.

## 2023-10-12 MED ORDER — ALLOPURINOL 300 MG PO TABS: 300.0000 mg | ORAL_TABLET | Freq: Every day | ORAL | 1 refills | Status: AC

## 2023-10-12 MED ORDER — METFORMIN HCL ER 500 MG PO TB24: 1000.0000 mg | ORAL_TABLET | Freq: Every day | ORAL | 1 refills | Status: DC

## 2023-10-12 NOTE — Telephone Encounter (Signed)
 Requested Prescriptions  Pending Prescriptions Disp Refills   allopurinol  (ZYLOPRIM ) 300 MG tablet 90 tablet 4    Sig: Take 1 tablet (300 mg total) by mouth daily.     Endocrinology:  Gout Agents - allopurinol  Failed - 10/12/2023  1:45 PM      Failed - Uric Acid in normal range and within 360 days    Uric Acid, Serum  Date Value Ref Range Status  01/23/2021 6.0 2.5 - 7.0 mg/dL Final    Comment:    Therapeutic target for gout patients: <6.0 mg/dL .          Passed - Cr in normal range and within 360 days    Creat  Date Value Ref Range Status  09/17/2023 0.63 0.50 - 1.03 mg/dL Final   Creatinine, Urine  Date Value Ref Range Status  12/19/2020 120.95 mg/dL Final    Comment:    Performed at [facility], [address], [place], [place] [address]         Passed - Valid encounter within last 12 months    Recent Outpatient Visits           6 days ago Type 2 diabetes mellitus with other specified complication, without long-term current use of insulin  [facility])   [place] [facility] Aletha Bene, MD   3 weeks ago Tachycardia   [place] [facility] Aletha Bene, MD   3 months ago Encounter to establish care   [place] [facility] Aletha Bene, MD   9 months ago Type 2 diabetes mellitus with other specified complication, without long-term current use of insulin  [facility])   [facility] [place] Brien Belvie BRAVO, MD   1 year ago Primary hypertension   [facility] [place]. [facility] Brien Belvie BRAVO, MD              Passed - CBC within normal limits and completed in the last 12 months    WBC  Date Value Ref Range Status  09/17/2023 10.1 3.8 - 10.8 Thousand/uL Final   RBC  Date Value Ref Range Status  09/17/2023 4.44 3.80 - 5.10 Million/uL Final   Hemoglobin  Date Value Ref Range Status  09/17/2023 13.0  11.7 - 15.5 g/dL Final  87/89/7975 86.1 11.1 - 15.9 g/dL Final   HCT  Date Value Ref Range Status  09/17/2023 38.6 35.0 - 45.0 % Final   Hematocrit  Date Value Ref Range Status  12/29/2022 42.2 34.0 - 46.6 % Final   MCHC  Date Value Ref Range Status  09/17/2023 33.7 32.0 - 36.0 g/dL Final    Comment:    For adults, a slight decrease in the calculated MCHC value (in the range of 30 to 32 g/dL) is most likely not clinically significant; however, it should be interpreted with caution in correlation with other red cell parameters and the patient's clinical condition.    [facility]  Date Value Ref Range Status  09/17/2023 29.3 27.0 - 33.0 pg Final   MCV  Date Value Ref Range Status  09/17/2023 86.9 80.0 - 100.0 fL Final  12/29/2022 87 79 - 97 fL Final   No results found for: PLTCOUNTKUC, LABPLAT, POCPLA RDW  Date Value Ref Range Status  09/17/2023 14.6 11.0 - 15.0 % Final  12/29/2022 18.3 (H) 11.7 - 15.4 % Final  metFORMIN  (GLUCOPHAGE -XR) 500 MG 24 hr tablet 120 tablet 3    Sig: Take 2 tablets (1,000 mg total) by mouth daily with supper.     Endocrinology:  Diabetes - Biguanides Failed - 10/12/2023  1:45 PM      Failed - HBA1C is between 0 and 7.9 and within 180 days    Hemoglobin A1C  Date Value Ref Range Status  06/19/2020 8.1  Final   HbA1c, POC (controlled diabetic range)  Date Value Ref Range Status  08/13/2022 5.9 0.0 - 7.0 % Final   Hgb A1c MFr Bld  Date Value Ref Range Status  09/17/2023 8.6 (H) <5.7 % Final    Comment:    For someone without known diabetes, a hemoglobin A1c value of 6.5% or greater indicates that they may have  diabetes and this should be confirmed with a follow-up  test. . For someone with known diabetes, a value <7% indicates  that their diabetes is well controlled and a value  greater than or equal to 7% indicates suboptimal  control. A1c targets should be individualized based on  duration of diabetes, age, comorbid  conditions, and  other considerations. . Currently, no consensus exists regarding use of hemoglobin A1c for diagnosis of diabetes for children. .          Passed - Cr in normal range and within 360 days    Creat  Date Value Ref Range Status  09/17/2023 0.63 0.50 - 1.03 mg/dL Final   Creatinine, Urine  Date Value Ref Range Status  12/19/2020 120.95 mg/dL Final    Comment:    Performed at [facility], [address] [place], [place] [address]         Passed - eGFR in normal range and within 360 days    GFR, Est African American  Date Value Ref Range Status  02/01/2014 77 mL/min Final   GFR calc Af Amer  Date Value Ref Range Status  12/04/2019 79 >59 mL/min/1.73 Final    Comment:    **In accordance with recommendations from the NKF-ASN Task force,**   [facility] is in the process of updating its eGFR calculation to the   2021 CKD-EPI creatinine equation that estimates kidney function   without a race variable.    GFR, Est Non African American  Date Value Ref Range Status  02/01/2014 67 mL/min Final    Comment:      The estimated GFR is a calculation valid for adults (>=74 years old) that uses the CKD-EPI algorithm to adjust for age and sex. It is   not to be used for children, pregnant women, hospitalized patients,    patients on dialysis, or with rapidly changing kidney function. According to the NKDEP, eGFR >89 is normal, 60-89 shows mild impairment, 30-59 shows moderate impairment, 15-29 shows severe impairment and <15 is ESRD.      GFR, Estimated  Date Value Ref Range Status  12/24/2021 >60 >60 mL/min Final    Comment:    (NOTE) Calculated using the CKD-EPI Creatinine Equation (2021)    GFR  Date Value Ref Range Status  06/06/2019 95.71 >60.00 mL/min Final   eGFR  Date Value Ref Range Status  09/17/2023 103 > OR = 60 mL/min/1.2m2 Final  02/09/2023 77 >59 mL/min/1.73 Final         Passed - B12 Level in normal range and within 720 days     Vitamin B-12  Date Value Ref Range Status  12/21/2021 423 180 -  914 pg/mL Final    Comment:    (NOTE) This assay is not validated for testing neonatal or myeloproliferative syndrome specimens for Vitamin B12 levels. Performed at [facility], [address], [place], [place] [address]          Passed - Valid encounter within last 6 months    Recent Outpatient Visits           6 days ago Type 2 diabetes mellitus with other specified complication, without long-term current use of insulin  [facility])   [place] [facility] Aletha Bene, MD   3 weeks ago Tachycardia   [facility] Aletha Bene, MD   3 months ago Encounter to establish care   [place] [facility] Aletha Bene, MD   9 months ago Type 2 diabetes mellitus with other specified complication, without long-term current use of insulin  [facility])   [facility] [place] Brien Belvie BRAVO, MD   1 year ago Primary hypertension   [facility] [place]. [facility] Brien Belvie BRAVO, MD              Passed - CBC within normal limits and completed in the last 12 months    WBC  Date Value Ref Range Status  09/17/2023 10.1 3.8 - 10.8 Thousand/uL Final   RBC  Date Value Ref Range Status  09/17/2023 4.44 3.80 - 5.10 Million/uL Final   Hemoglobin  Date Value Ref Range Status  09/17/2023 13.0 11.7 - 15.5 g/dL Final  87/89/7975 86.1 11.1 - 15.9 g/dL Final   HCT  Date Value Ref Range Status  09/17/2023 38.6 35.0 - 45.0 % Final   Hematocrit  Date Value Ref Range Status  12/29/2022 42.2 34.0 - 46.6 % Final   MCHC  Date Value Ref Range Status  09/17/2023 33.7 32.0 - 36.0 g/dL Final    Comment:    For adults, a slight decrease in the calculated MCHC value (in the range of 30 to 32 g/dL) is most likely not clinically significant; however, it  should be interpreted with caution in correlation with other red cell parameters and the patient's clinical condition.    [facility]  Date Value Ref Range Status  09/17/2023 29.3 27.0 - 33.0 pg Final   MCV  Date Value Ref Range Status  09/17/2023 86.9 80.0 - 100.0 fL Final  12/29/2022 87 79 - 97 fL Final   No results found for: PLTCOUNTKUC, LABPLAT, POCPLA RDW  Date Value Ref Range Status  09/17/2023 14.6 11.0 - 15.0 % Final  12/29/2022 18.3 (H) 11.7 - 15.4 % Final

## 2023-10-13 NOTE — Progress Notes (Signed)
 10 Day Zio was applied for Palpitations per dr. Terryann.

## 2023-10-27 ENCOUNTER — Ambulatory Visit: Admitting: Family Medicine

## 2023-10-27 ENCOUNTER — Encounter: Payer: Self-pay | Admitting: Family Medicine

## 2023-10-27 VITALS — BP 122/80 | HR 99 | Temp 98.5°F | Ht 63.0 in | Wt 142.5 lb

## 2023-10-27 DIAGNOSIS — I10 Essential (primary) hypertension: Secondary | ICD-10-CM | POA: Diagnosis not present

## 2023-10-27 DIAGNOSIS — Z720 Tobacco use: Secondary | ICD-10-CM

## 2023-10-27 DIAGNOSIS — I82401 Acute embolism and thrombosis of unspecified deep veins of right lower extremity: Secondary | ICD-10-CM

## 2023-10-27 DIAGNOSIS — Z86718 Personal history of other venous thrombosis and embolism: Secondary | ICD-10-CM | POA: Diagnosis not present

## 2023-10-27 DIAGNOSIS — R600 Localized edema: Secondary | ICD-10-CM

## 2023-10-27 DIAGNOSIS — R82998 Other abnormal findings in urine: Secondary | ICD-10-CM

## 2023-10-27 MED ORDER — HYDROCHLOROTHIAZIDE 25 MG PO TABS: 25.0000 mg | ORAL_TABLET | Freq: Every day | ORAL | 1 refills | Status: AC

## 2023-10-27 NOTE — Progress Notes (Signed)
 Patient Office Visit  Assessment & Plan:  Localized edema -     hydroCHLOROthiazide ; Take 1 tablet (25 mg total) by mouth daily.  Dispense: 90 tablet; Refill: 1 -     Comprehensive metabolic panel with GFR -     CBC with Differential/Platelet -     VAS US  LOWER EXTREMITY VENOUS (DVT); Future  Foamy urine -     Urinalysis, Routine w reflex microscopic  History of DVT (deep vein thrombosis) -     Comprehensive metabolic panel with GFR -     CBC with Differential/Platelet -     VAS US  LOWER EXTREMITY VENOUS (DVT); Future -     Ambulatory referral to Hematology / Oncology  Primary hypertension -     Comprehensive metabolic panel with GFR -     CBC with Differential/Platelet  Tobacco use  Acute deep vein thrombosis (DVT) of right lower extremity, unspecified vein (HCC) -     Apixaban Starter Pack; Take as directed on package: start with two-5mg  tablets twice daily for 7 days. On day 8, switch to one-5mg  tablet twice daily.  Dispense: 74 each; Refill: 0 -     Ambulatory referral to Hematology / Oncology  Other orders -     MICROSCOPIC MESSAGE -     Apixaban; Take 1 tablet (5 mg total) by mouth 2 (two) times daily. Continue taking after starter pack  Dispense: 60 tablet; Refill: 1   Assessment and Plan    Bilateral lower extremity pitting edema Right lower extremity pitting edema. History of blood clot post-foot fracture, no anticoagulation. Sedentary lifestyle may contribute to venous stasis. - Order venous Doppler ultrasound of the  lower extremity to rule out deep vein thrombosis. - Order blood work to assess for potential causes of edema. - Consider reducing amlodipine  dosage to 5mg  and starting a diuretic HCTZ- patient aware  if Doppler is negative for thrombosis patient will be notified.  Foamy urine Persistent foamy urine without associated symptoms. - Obtain urine sample for analysis.  Persistent cough following prior bronchitis Persistent cough since bronchitis in  late August. Smoking history may contribute. recommend cessation  Tobacco use disorder Attempting to quit smoking, reduced smoking, using herbal supplement. Concerned about Chantix  interactions with antidepressant. - Continue to support smoking cessation efforts. - Discuss potential interactions of Chantix  with current medications with her therapist or prescribing physician.      Addendum- was notified Thursday PM - positive for DVT on right side, spoke with patient re this. If she has shortness of breath/chest pain she will go to ED. Eliquis will be sent to pharmacy. Patient aware and knows how to take this.  RIGHT: venous doppler - Findings consistent with acute deep vein thrombosis involving the tibioperoneal confluence.  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  Left- negative for DVT  Return if symptoms worsen or fail to improve.   Subjective:    Patient ID: Stacy [name], female    DOB: [birth year]  Age: 58 y.o. MRN: [id number]  Chief Complaint  Patient presents with   Foot Swelling    Bilateral swelling in feet x 1.5 weeks.     HPI Discussed the use of AI scribe software for clinical note transcription with the patient, who gave verbal consent to proceed.  History of Present Illness        History of Present Illness Stacy [name] is a 58 year old female with hypertension who presents with bilateral  leg swelling but worse in the right leg.  She has experienced swelling both legs but worse in her right leg for the past week and a half, with the swelling worsening as the day progresses and subsiding overnight. The swelling is described as uncomfortable, and she reports that it is worse in the right ankle. No recent changes in medication, specifically amlodipine  10mg  daily have been made, and there is no associated weight gain. She has a history of a blood clot 2 years ago following a foot fracture but has not experienced any  recurrence since then and is not on anticoagulant therapy. She denies any new medications or changes in salt intake. Her fluid intake includes three to four average-sized water bottles per day and two to three glasses of Pepsi daily.  She experiences shortness of breath when climbing stairs, attributing it to aging and her smoking habit. She is attempting to quit smoking and has reduced her cigarette consumption with the help of an herbal supplement. She has a history of bronchitis, which has left her with a persistent cough and hoarseness since the end of August. No fever, chills, or chest pain are reported.  She has noticed foam in her urine since her last visit but denies any urinary symptoms such as burning or blood in the urine. A urine sample has not yet been provided for analysis during this visit.  Physical Exam MEASUREMENTS: Weight- 142. EXTREMITIES: Pitting edema both legs, worse in right leg.  Assessment and Plan Bilateral lower extremity pitting edema Right lower extremity pitting edema. History of blood clot post-foot fracture, no anticoagulation. Sedentary lifestyle may contribute to venous stasis. - Order venous Doppler ultrasound of the  lower extremity to rule out deep vein thrombosis. - Order blood work to assess for potential causes of edema. - Consider reducing amlodipine  dosage to 5mg  and starting a diuretic HCTZ- patient aware  if Doppler is negative for thrombosis patient will be notified.  Foamy urine Persistent foamy urine without associated symptoms. - Obtain urine sample for analysis.  Persistent cough following prior bronchitis Persistent cough since bronchitis in late August. Smoking history may contribute. recommend cessation  Tobacco use disorder Attempting to quit smoking, reduced smoking, using herbal supplement. Concerned about Chantix  interactions with antidepressant. - Continue to support smoking cessation efforts. - Discuss potential interactions of  Chantix  with current medications with her therapist or prescribing physician.    The ASCVD Risk score (Arnett DK, et al., 2019) failed to calculate for the following reasons:   The valid HDL cholesterol range is 20 to 100 mg/dL  Past Medical History:  Diagnosis Date   Acute bronchitis 08/02/2019   Acute deep vein thrombosis (DVT) of femoral vein of right lower extremity (HCC) 04/07/2021   Adrenal adenoma    as per MRI 03/09/14   AKI (acute kidney injury) 12/20/2020   Allergy    Anxiety    Coronary artery calcification seen on CAT scan 04/12/2018   Depression    Diabetes mellitus    Diverticulosis    Elevated LFTs    elevated since 09/2013   Fatty liver    moderate- as per 03/06/17 MRI liver   GERD (gastroesophageal reflux disease)    Gout    H/O hiatal hernia    Headache(784.0)    History of depression    Hyperlipemia    Hyperplastic colon polyp    Hypertension    Hypoglycemia 12/19/2021   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 Tobie 02/27/2014   Tobacco abuse    Tubular adenoma of colon    Past Surgical History:  Procedure Laterality Date   ANAL FISTULECTOMY  01/19/1985   BALLOON DILATION N/A 12/23/2021   Procedure: BALLOON DILATION;  Surgeon: Avram Lupita BRAVO, MD;  Location: [facility];  Service: Gastroenterology;  Laterality: N/A;   BIOPSY  12/23/2021   Procedure: BIOPSY;  Surgeon: Avram Lupita BRAVO, MD;  Location: [facility];  Service: Gastroenterology;;   BREAST EXCISIONAL BIOPSY Left    BREAST LUMPECTOMY Left 1988   COLONOSCOPY WITH PROPOFOL  N/A 12/23/2021   Procedure: COLONOSCOPY WITH PROPOFOL ;  Surgeon: Avram Lupita BRAVO, MD;  Location: [facility];  Service: Gastroenterology;  Laterality: N/A;   ESOPHAGOGASTRODUODENOSCOPY (EGD) WITH PROPOFOL  N/A 12/23/2021   Procedure: ESOPHAGOGASTRODUODENOSCOPY (EGD) WITH PROPOFOL ;   Surgeon: Avram Lupita BRAVO, MD;  Location: [facility];  Service: Gastroenterology;  Laterality: N/A;   PAROTIDECTOMY  03/30/2011   Procedure: PAROTIDECTOMY;  Surgeon: Ida Loader, MD;  Location: [facility];  Service: ENT;  Laterality: Right;   WRIST GANGLION EXCISION Left 1988   Social History   Tobacco Use   Smoking status: Every Day    Current packs/day: 1.00    Average packs/day: 1 pack/day for 28.0 years (28.0 ttl pk-yrs)    Types: Cigarettes   Smokeless tobacco: Never  Vaping Use   Vaping status: Never Used  Substance Use Topics   Alcohol use: Yes    Alcohol/week: 14.0 standard drinks of alcohol    Types: 14 Standard drinks or equivalent per week    Comment: occ   Drug use: No   Family History  Problem Relation Age of Onset   Hypertension Mother    Stroke Mother    Coronary artery disease Father 70       CAD,CABG   Diabetes Sister        uncontrolled   Cancer Sister 79       uterine cancer   Aneurysm Sister        brain   Heart attack Maternal Grandmother    Blindness Paternal Grandmother    Breast cancer Neg Hx    Colon cancer Neg Hx    Esophageal cancer Neg Hx    Rectal cancer Neg Hx    Stomach cancer Neg Hx    Allergies  Allergen Reactions   Almond Meal (Obsolete) Anaphylaxis and Swelling   Other Anaphylaxis    Almond Butter   Aspirin  Nausea Only    Other Reaction(s): Not available, Unknown    ROS    Objective:    BP 122/80   Pulse 99   Temp 98.5 F (36.9 C)   Ht 5' 3 (1.6 m)   Wt 142 lb 8 oz (64.6 kg)   SpO2 98%   BMI 25.24 kg/m  BP Readings from Last 3 Encounters:  10/27/23 122/80  10/06/23 124/76  09/17/23 (!) 140/92   Wt Readings from Last 3 Encounters:  10/27/23 142 lb 8 oz (64.6 kg)  10/06/23 141 lb (64 kg)  09/17/23 136 lb 2 oz (61.7 kg)    Physical Exam Vitals and nursing note reviewed.  Constitutional:      General: She is not in acute distress.    Appearance: Normal appearance.  HENT:     Head:  Normocephalic.     Right Ear: Tympanic membrane, ear canal and  external ear normal.     Left Ear: Tympanic membrane, ear canal and external ear normal.  Eyes:     Extraocular Movements: Extraocular movements intact.     Conjunctiva/sclera: Conjunctivae normal.     Pupils: Pupils are equal, round, and reactive to light.  Cardiovascular:     Rate and Rhythm: Normal rate and regular rhythm.     Heart sounds: Normal heart sounds.  Pulmonary:     Effort: Pulmonary effort is normal.     Breath sounds: Normal breath sounds.  Musculoskeletal:     Right lower leg: Swelling present. 1+ Pitting Edema present.     Left lower leg: Swelling present. 1+ Pitting Edema present.     Comments: Right leg and ankle- increased edema compared to left ankle. No skin breakdown  Neurological:     General: No focal deficit present.     Mental Status: She is alert and oriented to person, place, and time.  Psychiatric:        Mood and Affect: Mood normal.        Behavior: Behavior normal.      Results for orders placed or performed in visit on 10/27/23  MICROSCOPIC MESSAGE  Result Value Ref Range   Note    Urinalysis, Routine w reflex microscopic  Result Value Ref Range   Color, Urine YELLOW YELLOW   APPearance CLEAR CLEAR   Specific Gravity, Urine 1.017 1.001 - 1.035   pH 5.5 5.0 - 8.0   Glucose, UA NEGATIVE NEGATIVE   Bilirubin Urine NEGATIVE NEGATIVE   Ketones, ur NEGATIVE NEGATIVE   Hgb urine dipstick NEGATIVE NEGATIVE   Protein, ur 1+ (A) NEGATIVE   Nitrite NEGATIVE NEGATIVE   Leukocytes,Ua NEGATIVE NEGATIVE   WBC, UA 0-5 0 - 5 /HPF   RBC / HPF NONE SEEN 0 - 2 /HPF   Squamous Epithelial / HPF 0-5 < OR = 5 /HPF   Bacteria, UA NONE SEEN NONE SEEN /HPF   Hyaline Cast NONE SEEN NONE SEEN /LPF  Comprehensive metabolic panel with GFR  Result Value Ref Range   Glucose, Bld 205 (H) 65 - 99 mg/dL   BUN 23 7 - 25 mg/dL   Creat 8.90 (H) 9.49 - 1.03 mg/dL   eGFR 59 (L) > OR = 60 mL/min/1.73m2    BUN/Creatinine Ratio 21 6 - 22 (calc)   Sodium 137 135 - 146 mmol/L   Potassium 3.8 3.5 - 5.3 mmol/L   Chloride 97 (L) 98 - 110 mmol/L   CO2 31 20 - 32 mmol/L   Calcium  10.1 8.6 - 10.4 mg/dL   Total Protein 7.2 6.1 - 8.1 g/dL   Albumin 3.9 3.6 - 5.1 g/dL   Globulin 3.3 1.9 - 3.7 g/dL (calc)   AG Ratio 1.2 1.0 - 2.5 (calc)   Total Bilirubin 0.7 0.2 - 1.2 mg/dL   Alkaline phosphatase (APISO) 260 (H) 37 - 153 U/L   AST 50 (H) 10 - 35 U/L   ALT 133 (H) 6 - 29 U/L  CBC with Differential/Platelet  Result Value Ref Range   WBC 9.8 3.8 - 10.8 Thousand/uL   RBC 4.09 3.80 - 5.10 Million/uL   Hemoglobin 12.5 11.7 - 15.5 g/dL   HCT 63.7 64.9 - 54.9 %   MCV 88.5 80.0 - 100.0 fL   MCH 30.6 27.0 - 33.0 pg   MCHC 34.5 32.0 - 36.0 g/dL   RDW 84.5 (H) 88.9 - 84.9 %   Platelets 196 140 - 400 Thousand/uL  MPV 11.4 7.5 - 12.5 fL   Neutro Abs 5,821 1,500 - 7,800 cells/uL   Absolute Lymphocytes 3,058 850 - 3,900 cells/uL   Absolute Monocytes 755 200 - 950 cells/uL   Eosinophils Absolute 108 15 - 500 cells/uL   Basophils Absolute 59 0 - 200 cells/uL   Neutrophils Relative % 59.4 %   Total Lymphocyte 31.2 %   Monocytes Relative 7.7 %   Eosinophils Relative 1.1 %   Basophils Relative 0.6 %

## 2023-10-28 ENCOUNTER — Ambulatory Visit (HOSPITAL_COMMUNITY)
Admission: RE | Admit: 2023-10-28 | Discharge: 2023-10-28 | Disposition: A | Discharge status: Home / Self Care | Source: Ambulatory Visit | Attending: Vascular Surgery | Admitting: Vascular Surgery

## 2023-10-28 ENCOUNTER — Ambulatory Visit: Payer: Self-pay | Admitting: Family Medicine

## 2023-10-28 DIAGNOSIS — R600 Localized edema: Secondary | ICD-10-CM | POA: Insufficient documentation

## 2023-10-28 DIAGNOSIS — Z86718 Personal history of other venous thrombosis and embolism: Secondary | ICD-10-CM | POA: Insufficient documentation

## 2023-10-28 LAB — URINALYSIS, ROUTINE W REFLEX MICROSCOPIC
Bacteria, UA: NONE SEEN /HPF
Bilirubin Urine: NEGATIVE
Glucose, UA: NEGATIVE
Hgb urine dipstick: NEGATIVE
Hyaline Cast: NONE SEEN /LPF
Ketones, ur: NEGATIVE
Leukocytes,Ua: NEGATIVE
Nitrite: NEGATIVE
RBC / HPF: NONE SEEN /HPF (ref 0–2)
Specific Gravity, Urine: 1.017 (ref 1.001–1.035)
pH: 5.5 (ref 5.0–8.0)

## 2023-10-28 LAB — CBC WITH DIFFERENTIAL/PLATELET
Absolute Lymphocytes: 3058 {cells}/uL (ref 850–3900)
Absolute Monocytes: 755 {cells}/uL (ref 200–950)
Basophils Absolute: 59 {cells}/uL (ref 0–200)
Basophils Relative: 0.6 %
Eosinophils Absolute: 108 {cells}/uL (ref 15–500)
Eosinophils Relative: 1.1 %
HCT: 36.2 % (ref 35.0–45.0)
Hemoglobin: 12.5 g/dL (ref 11.7–15.5)
MCH: 30.6 pg (ref 27.0–33.0)
MCHC: 34.5 g/dL (ref 32.0–36.0)
MCV: 88.5 fL (ref 80.0–100.0)
MPV: 11.4 fL (ref 7.5–12.5)
Monocytes Relative: 7.7 %
Neutro Abs: 5821 {cells}/uL (ref 1500–7800)
Neutrophils Relative %: 59.4 %
Platelets: 196 Thousand/uL (ref 140–400)
RBC: 4.09 Million/uL (ref 3.80–5.10)
RDW: 15.4 % — ABNORMAL HIGH (ref 11.0–15.0)
Total Lymphocyte: 31.2 %
WBC: 9.8 Thousand/uL (ref 3.8–10.8)

## 2023-10-28 LAB — COMPREHENSIVE METABOLIC PANEL WITH GFR
AG Ratio: 1.2 (calc) (ref 1.0–2.5)
ALT: 133 U/L — ABNORMAL HIGH (ref 6–29)
AST: 50 U/L — ABNORMAL HIGH (ref 10–35)
Albumin: 3.9 g/dL (ref 3.6–5.1)
Alkaline phosphatase (APISO): 260 U/L — ABNORMAL HIGH (ref 37–153)
BUN/Creatinine Ratio: 21 (calc) (ref 6–22)
BUN: 23 mg/dL (ref 7–25)
CO2: 31 mmol/L (ref 20–32)
Calcium: 10.1 mg/dL (ref 8.6–10.4)
Chloride: 97 mmol/L — ABNORMAL LOW (ref 98–110)
Creat: 1.09 mg/dL — ABNORMAL HIGH (ref 0.50–1.03)
Globulin: 3.3 g/dL (ref 1.9–3.7)
Glucose, Bld: 205 mg/dL — ABNORMAL HIGH (ref 65–99)
Potassium: 3.8 mmol/L (ref 3.5–5.3)
Sodium: 137 mmol/L (ref 135–146)
Total Bilirubin: 0.7 mg/dL (ref 0.2–1.2)
Total Protein: 7.2 g/dL (ref 6.1–8.1)
eGFR: 59 mL/min/1.73m2 — ABNORMAL LOW (ref >=60)

## 2023-10-28 LAB — MICROSCOPIC MESSAGE

## 2023-10-28 MED ORDER — APIXABAN (ELIQUIS) VTE STARTER PACK (10MG AND 5MG): ORAL_TABLET | ORAL | 0 refills | Status: AC

## 2023-10-28 MED ORDER — APIXABAN 5 MG PO TABS: 5.0000 mg | ORAL_TABLET | Freq: Two times a day (BID) | ORAL | 1 refills | Status: AC

## 2023-10-28 NOTE — Addendum Note (Signed)
 Addended by: ALETHA CONNIE HERO on: 10/28/2023 05:52 PM   Modules accepted: Orders

## 2023-11-23 ENCOUNTER — Telehealth: Payer: Self-pay

## 2023-11-23 NOTE — Telephone Encounter (Signed)
 Copied from CRM [id number]. Topic: General - Other >> Nov 23, 2023 10:16 AM Cleave MATSU wrote: Reason for CRM: pt stated that she dont know what this appt is about on the 11th for cancer she stated that nobody talked to her about this. Please callback and discuss

## 2023-11-23 NOTE — Telephone Encounter (Signed)
 Informed pt that she was referred to Hematology for her recurrent DVT. She verbalized understanding and had no other questions.

## 2023-11-30 ENCOUNTER — Encounter: Payer: Self-pay | Admitting: Oncology

## 2023-11-30 ENCOUNTER — Inpatient Hospital Stay

## 2023-11-30 ENCOUNTER — Inpatient Hospital Stay: Attending: Oncology | Admitting: Oncology

## 2023-11-30 VITALS — BP 97/77 | HR 118 | Temp 97.5°F | Resp 16 | Wt 134.3 lb

## 2023-11-30 DIAGNOSIS — Z7901 Long term (current) use of anticoagulants: Secondary | ICD-10-CM | POA: Diagnosis not present

## 2023-11-30 DIAGNOSIS — I82491 Acute embolism and thrombosis of other specified deep vein of right lower extremity: Secondary | ICD-10-CM

## 2023-11-30 DIAGNOSIS — R911 Solitary pulmonary nodule: Secondary | ICD-10-CM | POA: Diagnosis not present

## 2023-11-30 DIAGNOSIS — M25473 Effusion, unspecified ankle: Secondary | ICD-10-CM | POA: Insufficient documentation

## 2023-11-30 DIAGNOSIS — M109 Gout, unspecified: Secondary | ICD-10-CM | POA: Insufficient documentation

## 2023-11-30 DIAGNOSIS — J449 Chronic obstructive pulmonary disease, unspecified: Secondary | ICD-10-CM | POA: Diagnosis not present

## 2023-11-30 DIAGNOSIS — I2510 Atherosclerotic heart disease of native coronary artery without angina pectoris: Secondary | ICD-10-CM | POA: Insufficient documentation

## 2023-11-30 DIAGNOSIS — Z79899 Other long term (current) drug therapy: Secondary | ICD-10-CM | POA: Insufficient documentation

## 2023-11-30 DIAGNOSIS — E785 Hyperlipidemia, unspecified: Secondary | ICD-10-CM | POA: Insufficient documentation

## 2023-11-30 DIAGNOSIS — M549 Dorsalgia, unspecified: Secondary | ICD-10-CM | POA: Insufficient documentation

## 2023-11-30 DIAGNOSIS — Z8616 Personal history of COVID-19: Secondary | ICD-10-CM | POA: Diagnosis not present

## 2023-11-30 DIAGNOSIS — Z86018 Personal history of other benign neoplasm: Secondary | ICD-10-CM | POA: Insufficient documentation

## 2023-11-30 DIAGNOSIS — F329 Major depressive disorder, single episode, unspecified: Secondary | ICD-10-CM | POA: Diagnosis not present

## 2023-11-30 DIAGNOSIS — F32A Depression, unspecified: Secondary | ICD-10-CM

## 2023-11-30 DIAGNOSIS — Z886 Allergy status to analgesic agent status: Secondary | ICD-10-CM | POA: Diagnosis not present

## 2023-11-30 DIAGNOSIS — Z833 Family history of diabetes mellitus: Secondary | ICD-10-CM | POA: Insufficient documentation

## 2023-11-30 DIAGNOSIS — I10 Essential (primary) hypertension: Secondary | ICD-10-CM | POA: Diagnosis not present

## 2023-11-30 DIAGNOSIS — Z821 Family history of blindness and visual loss: Secondary | ICD-10-CM | POA: Insufficient documentation

## 2023-11-30 DIAGNOSIS — Z8249 Family history of ischemic heart disease and other diseases of the circulatory system: Secondary | ICD-10-CM | POA: Insufficient documentation

## 2023-11-30 DIAGNOSIS — R630 Anorexia: Secondary | ICD-10-CM | POA: Diagnosis not present

## 2023-11-30 DIAGNOSIS — Z823 Family history of stroke: Secondary | ICD-10-CM | POA: Insufficient documentation

## 2023-11-30 DIAGNOSIS — Z860101 Personal history of adenomatous and serrated colon polyps: Secondary | ICD-10-CM | POA: Diagnosis not present

## 2023-11-30 DIAGNOSIS — Z560 Unemployment, unspecified: Secondary | ICD-10-CM | POA: Diagnosis not present

## 2023-11-30 DIAGNOSIS — E119 Type 2 diabetes mellitus without complications: Secondary | ICD-10-CM | POA: Diagnosis not present

## 2023-11-30 DIAGNOSIS — Z7289 Other problems related to lifestyle: Secondary | ICD-10-CM

## 2023-11-30 DIAGNOSIS — Z809 Family history of malignant neoplasm, unspecified: Secondary | ICD-10-CM | POA: Diagnosis not present

## 2023-11-30 DIAGNOSIS — I82511 Chronic embolism and thrombosis of right femoral vein: Secondary | ICD-10-CM | POA: Diagnosis not present

## 2023-11-30 DIAGNOSIS — E669 Obesity, unspecified: Secondary | ICD-10-CM | POA: Diagnosis not present

## 2023-11-30 DIAGNOSIS — K760 Fatty (change of) liver, not elsewhere classified: Secondary | ICD-10-CM | POA: Diagnosis not present

## 2023-11-30 DIAGNOSIS — F17210 Nicotine dependence, cigarettes, uncomplicated: Secondary | ICD-10-CM | POA: Diagnosis not present

## 2023-11-30 DIAGNOSIS — I82401 Acute embolism and thrombosis of unspecified deep veins of right lower extremity: Secondary | ICD-10-CM | POA: Insufficient documentation

## 2023-11-30 LAB — CBC WITH DIFFERENTIAL (CANCER CENTER ONLY)
Abs Immature Granulocytes: 0.06 K/uL (ref 0.00–0.07)
Basophils Absolute: 0 K/uL (ref 0.0–0.1)
Basophils Relative: 0 %
Eosinophils Absolute: 0.1 K/uL (ref 0.0–0.5)
Eosinophils Relative: 1 %
HCT: 32.9 % — ABNORMAL LOW (ref 36.0–46.0)
Hemoglobin: 10.9 g/dL — ABNORMAL LOW (ref 12.0–15.0)
Immature Granulocytes: 1 %
Lymphocytes Relative: 25 %
Lymphs Abs: 2.9 K/uL (ref 0.7–4.0)
MCH: 29.4 pg (ref 26.0–34.0)
MCHC: 33.1 g/dL (ref 30.0–36.0)
MCV: 88.7 fL (ref 80.0–100.0)
Monocytes Absolute: 0.5 K/uL (ref 0.1–1.0)
Monocytes Relative: 5 %
Neutro Abs: 7.8 K/uL — ABNORMAL HIGH (ref 1.7–7.7)
Neutrophils Relative %: 68 %
Platelet Count: 206 K/uL (ref 150–400)
RBC: 3.71 MIL/uL — ABNORMAL LOW (ref 3.87–5.11)
RDW: 14.9 % (ref 11.5–15.5)
WBC Count: 11.5 K/uL — ABNORMAL HIGH (ref 4.0–10.5)
nRBC: 0.2 % (ref 0.0–0.2)

## 2023-11-30 LAB — CMP (CANCER CENTER ONLY)
ALT: 57 U/L — ABNORMAL HIGH (ref 0–44)
AST: 79 U/L — ABNORMAL HIGH (ref 15–41)
Albumin: 4.2 g/dL (ref 3.5–5.0)
Alkaline Phosphatase: 207 U/L — ABNORMAL HIGH (ref 38–126)
Anion gap: 12 (ref 5–15)
BUN: 36 mg/dL — ABNORMAL HIGH (ref 6–20)
CO2: 22 mmol/L (ref 22–32)
Calcium: 10.2 mg/dL (ref 8.9–10.3)
Chloride: 102 mmol/L (ref 98–111)
Creatinine: 1.72 mg/dL — ABNORMAL HIGH (ref 0.44–1.00)
GFR, Estimated: 34 mL/min — ABNORMAL LOW (ref >60)
Glucose, Bld: 170 mg/dL — ABNORMAL HIGH (ref 70–99)
Potassium: 4.5 mmol/L (ref 3.5–5.1)
Sodium: 137 mmol/L (ref 135–145)
Total Bilirubin: 0.5 mg/dL (ref 0.0–1.2)
Total Protein: 8 g/dL (ref 6.5–8.1)

## 2023-11-30 LAB — D-DIMER, QUANTITATIVE: D-Dimer, Quant: 0.62 ug{FEU}/mL — ABNORMAL HIGH (ref 0.00–0.50)

## 2023-11-30 NOTE — Assessment & Plan Note (Signed)
 On 10/28/2023, bilateral lower extremity ultrasound was obtained.  It showed acute DVT involving the tibioperoneal confluence in the right lower extremity.  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  No evidence of DVT in the left lower extremity.   Acute DVT in the right lower extremity with no recent surgeries, trauma, or prolonged immobility.   Previous DVT in 2023 associated with a foot fracture.   Current DVT lacks a clear trigger, necessitating further investigation for potential thrombophilia. No leg pain reported.  - Ordered blood tests to evaluate for thrombophilia including prothrombin gene mutation analysis, factor V Leiden mutation analysis, anticardiolipin antibodies, beta-2 glycoprotein antibodies.  Rest of the hypercoagulable workup will be deferred as it can be falsely abnormal given recent thrombosis and ongoing anticoagulation.  - Continue Eliquis 5 mg twice daily.  - Plan for a follow-up ultrasound before the next visit.  - Scheduled follow-up appointment in four months.

## 2023-11-30 NOTE — Progress Notes (Signed)
 [facility]  HEMATOLOGY CLINIC CONSULTATION NOTE   PATIENT NAME: Stacy [name]   MR#: [id number] DOB: 1965/05/06  DATE OF SERVICE: 11/30/2023   REFERRING PROVIDER  Aletha Bene, MD   Patient Care Team: Aletha Bene, MD as PCP - General (Family Medicine) Raford Riggs, MD as PCP - Cardiology (Cardiology)   REASON FOR CONSULTATION/ CHIEF COMPLAINT:  Right lower extremity DVT  ASSESSMENT & PLAN:  Stacy [name] is a 58 y.o. lady with a past medical history of DVT in the right lower extremity in March 2023 provoked by a foot fracture at that time, COPD, diabetes mellitus, dyslipidemia, hypertension, nicotine  dependence, was referred to our service for evaluation after she was diagnosed with right lower extremity DVT on 10/28/2023.  Acute deep vein thrombosis (DVT) of right lower extremity (HCC) On 10/28/2023, bilateral lower extremity ultrasound was obtained.  It showed acute DVT involving the tibioperoneal confluence in the right lower extremity.  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  No evidence of DVT in the left lower extremity.   Acute DVT in the right lower extremity with no recent surgeries, trauma, or prolonged immobility.   Previous DVT in 2023 associated with a foot fracture.   Current DVT lacks a clear trigger, necessitating further investigation for potential thrombophilia. No leg pain reported.  - Ordered blood tests to evaluate for thrombophilia including prothrombin gene mutation analysis, factor V Leiden mutation analysis, anticardiolipin antibodies, beta-2 glycoprotein antibodies.  Rest of the hypercoagulable workup will be deferred as it can be falsely abnormal given recent thrombosis and ongoing anticoagulation.  - Continue Eliquis 5 mg twice daily.  - Plan for a follow-up ultrasound before the next visit.  - Scheduled follow-up appointment in four months.  Solitary pulmonary nodule, left upper  lobe 10x5 mm nodule in the left upper lobe, unchanged since July. Biopsy not recommended due to small size and potential complications. Follow-up CT scan recommended in six months. Smoking cessation advised to reduce risk. - Scheduled follow-up CT scan in May 2026. - Advised smoking cessation.  Chronic obstructive pulmonary disease (COPD) COPD managed with Trelegy inhaler. - Continue Trelegy inhaler as prescribed.  Tobacco use disorder Long-term tobacco use, smoking a third of a pack per day for over 20 years. Smoking cessation advised to reduce risk of pulmonary complications and improve overall health. - Advised smoking cessation.  Major depressive disorder Experiencing depression, exacerbated by recent loss of husband and holiday stress. Increased alcohol consumption noted as a coping mechanism. - Discussed potential impact of alcohol on liver function and overall health.  I reviewed lab results and outside records for this visit and discussed relevant results with the patient. Diagnosis, plan of care and treatment options were also discussed in detail with the patient. Opportunity provided to ask questions and answers provided to her apparent satisfaction. Provided instructions to call our clinic with any problems, questions or concerns prior to return visit. I recommended to continue follow-up with PCP and sub-specialists. She verbalized understanding and agreed with the plan. No barriers to learning was detected.  Chinita Patten, MD  11/30/2023 5:06 PM  [facility] - A DEPT OF [facility] [address] [place] [address] Dept: [phone] Dept Fax: [phone]   HISTORY OF PRESENT ILLNESS:   Discussed the use of AI scribe software for clinical note transcription with the patient, who gave verbal consent to proceed.  History  of Present Illness Stacy [name] is a 58 year old female with a history of  blood clots who presents with a new blood clot in her RLE.  She noticed swelling in her foot and ankle, which led to a visit to her primary doctor.  On 10/28/2023, bilateral lower extremity ultrasound was obtained.  It showed acute DVT involving the tibioperoneal confluence in the right lower extremity.  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  No evidence of DVT in the left lower extremity.   She has no recent history of surgeries, trauma, or prolonged immobility. She has a history of a previous blood clot approximately two and a half years ago, associated with a foot fracture, treated with a wooden boot and six months of anticoagulation.  She experiences fatigue and tends to be inactive due to tiredness. She is not currently working. No known family history of blood clots. No recent long trips or flights. She denies leg pain but reports back pain. No chest pain or difficulty breathing. She has experienced weight loss due to a lack of appetite.  She has a history of a lung nodule in the left upper lobe, initially detected on a CT scan. She is currently seeing a lung specialist and has an appointment next week for further evaluation.  She smokes about a third of a pack of cigarettes daily and has been smoking for over twenty years. She drinks alcohol socially, averaging about three drinks per week, but notes an increase in consumption recently due to depression, particularly around the holidays following the unexpected loss of her husband.  She was recently diagnosed with COPD and uses a inhaler. She is currently on Eliquis, 5 mg twice daily.  Negative Thrombotic Risk Factors:  Recent surgery (within 3 months), Recent trauma (within 3 months), Recent admission to hospital with acute illness (within 3 months), Paralysis, paresis, or recent plaster cast immobilization of lower extremity, Central venous catheterization, Bed rest >72 hours within 3 months, Sedentary  journey lasting >8 hours within 4 weeks, Estrogen therapy, Erythropoiesis-stimulating agent, Recent COVID diagnosis (within 3 months), Active cancer, Non-malignant, chronic inflammatory condition, Known thrombophilic condition, Obesity, Older age    She denies fever, cough, diarrhea, or other infectious symptoms.  She denies epistaxis, bloody stool, melena, hematuria, bruising or other bleeding symptoms. She also denies unintentional weight loss, night sweats or other constitutional symptoms.  MEDICAL HISTORY Past Medical History:  Diagnosis Date   Acute bronchitis 08/02/2019   Acute deep vein thrombosis (DVT) of femoral vein of right lower extremity (HCC) 04/07/2021   Adrenal adenoma    as per MRI 03/09/14   AKI (acute kidney injury) 12/20/2020   Allergy    Anxiety    Coronary artery calcification seen on CAT scan 04/12/2018   Depression    Diabetes mellitus    Diverticulosis    Elevated LFTs    elevated since 09/2013   Fatty liver    moderate- as per 03/06/17 MRI liver   GERD (gastroesophageal reflux disease)    Gout    H/O hiatal hernia    Headache(784.0)    History of depression    Hyperlipemia    Hyperplastic colon polyp    Hypertension    Hypoglycemia 12/19/2021   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  Patel 02/27/2014   Tobacco abuse    Tubular adenoma of colon      SURGICAL HISTORY Past Surgical History:  Procedure Laterality Date   ANAL FISTULECTOMY  01/19/1985   BALLOON DILATION N/A 12/23/2021   Procedure: BALLOON DILATION;  Surgeon: Avram Lupita BRAVO, MD;  Location: [facility];  Service: Gastroenterology;  Laterality: N/A;   BIOPSY  12/23/2021   Procedure: BIOPSY;  Surgeon: Avram Lupita BRAVO, MD;  Location: [facility];  Service: Gastroenterology;;   BREAST EXCISIONAL BIOPSY Left    BREAST LUMPECTOMY Left 1988    COLONOSCOPY WITH PROPOFOL  N/A 12/23/2021   Procedure: COLONOSCOPY WITH PROPOFOL ;  Surgeon: Avram Lupita BRAVO, MD;  Location: [facility];  Service: Gastroenterology;  Laterality: N/A;   ESOPHAGOGASTRODUODENOSCOPY (EGD) WITH PROPOFOL  N/A 12/23/2021   Procedure: ESOPHAGOGASTRODUODENOSCOPY (EGD) WITH PROPOFOL ;  Surgeon: Avram Lupita BRAVO, MD;  Location: [facility];  Service: Gastroenterology;  Laterality: N/A;   PAROTIDECTOMY  03/30/2011   Procedure: PAROTIDECTOMY;  Surgeon: Ida Loader, MD;  Location: [facility];  Service: ENT;  Laterality: Right;   WRIST GANGLION EXCISION Left 1988     SOCIAL HISTORY: She reports that she has been smoking cigarettes. She has a 28 pack-year smoking history. She has never used smokeless tobacco. She reports current alcohol use of about 14.0 standard drinks of alcohol per week. She reports that she does not use drugs. Social History   Socioeconomic History   Marital status: Widowed    Spouse name: Not on file   Number of children: 2   Years of education: Not on file   Highest education level: Not on file  Occupational History    Employer: [employer]  Tobacco Use   Smoking status: Every Day    Current packs/day: 1.00    Average packs/day: 1 pack/day for 28.0 years (28.0 ttl pk-yrs)    Types: Cigarettes   Smokeless tobacco: Never  Vaping Use   Vaping status: Never Used  Substance and Sexual Activity   Alcohol use: Yes    Alcohol/week: 14.0 standard drinks of alcohol    Types: 14 Standard drinks or equivalent per week    Comment: occ   Drug use: No   Sexual activity: Not Currently    Birth control/protection: Post-menopausal  Other Topics Concern   Not on file  Social History Narrative   Married. 2 children.    Ages 58, 38 y/o ( entered 09/2012)      Works with [employer]   Social Drivers of Health   Financial Resource Strain: Not on file  Food Insecurity: No Food Insecurity (11/30/2023)    Hunger Vital Sign    Worried About Running Out of Food in the Last Year: Never true    Ran Out of Food in the Last Year: Never true  Transportation Needs: No Transportation Needs (11/30/2023)   PRAPARE - Administrator, Civil Service (Medical): No    Lack of Transportation (Non-Medical): No  Physical Activity: Not on file  Stress: Not on file  Social Connections: Not on file  Intimate Partner Violence: Not At Risk (11/30/2023)   Humiliation, Afraid, Rape, and Kick questionnaire    Fear of Current or Ex-Partner: No    Emotionally Abused: No    Physically Abused: No    Sexually Abused: No    FAMILY HISTORY: Her family history includes Aneurysm in her sister; Blindness in her paternal grandmother; Cancer (age of onset: 19) in her sister; Coronary artery disease (age  of onset: 82) in her father; Diabetes in her sister; Heart attack in her maternal grandmother; Hypertension in her mother; Stroke in her mother.  CURRENT MEDICATIONS   Current Outpatient Medications  Medication Instructions   albuterol  (VENTOLIN  HFA) 108 (90 Base) MCG/ACT inhaler 2 puffs, Inhalation, Every 4 hours PRN   allopurinol  (ZYLOPRIM ) 300 mg, Oral, Daily   amLODipine  (NORVASC ) 10 mg, Oral, Daily   APIXABAN (ELIQUIS) VTE STARTER PACK (10MG  AND 5MG ) Take as directed on package: start with two-5mg  tablets twice daily for 7 days. On day 8, switch to one-5mg  tablet twice daily.   apixaban (ELIQUIS) 5 mg, Oral, 2 times daily, Continue taking after starter pack   azelastine  (ASTELIN ) 0.1 % nasal spray 2 sprays, Each Nare, 2 times daily, Use in each nostril as directed   colchicine  0.6 mg, Oral, 2 times daily PRN   EPINEPHrine  0.3 mg/0.3 mL IJ SOAJ injection Insert into muscle once with any facial or neck swelling  Instruct in use   escitalopram  (LEXAPRO ) 20 mg, Oral, Daily   ferrous sulfate  325 mg, Oral, Daily with breakfast   folic acid  (FOLVITE ) 1 mg, Oral, Daily   hydrochlorothiazide  (HYDRODIURIL ) 25 mg,  Oral, Daily   hydrOXYzine (ATARAX) 25 mg, Daily PRN   Lancets (FREESTYLE) lancets Use as instructed   metFORMIN  (GLUCOPHAGE -XR) 1,000 mg, Oral, Daily with supper   mirtazapine  (REMERON  SOL-TAB) 15 mg, Oral, At bedtime PRN   Multiple Vitamin (MULTIVITAMIN WITH MINERALS) TABS tablet 1 tablet, Oral, Daily   olopatadine  (PATANOL) 0.1 % ophthalmic solution 1 drop, Both Eyes, 2 times daily   pantoprazole  (PROTONIX ) 40 mg, Oral, Daily   rosuvastatin  (CRESTOR ) 20 mg, Oral, Daily   spironolactone  (ALDACTONE ) 25 mg, Oral, Daily   valsartan  (DIOVAN ) 320 mg, Oral, Daily   varenicline  (CHANTIX ) 0.5 mg, Oral, 2 times daily   Vraylar 3 mg, Daily     ALLERGIES  She is allergic to almond meal (obsolete), other, and aspirin .  REVIEW OF SYSTEMS:  Review of Systems - Oncology   Rest of the pertinent review of systems is unremarkable except as mentioned above in HPI.  PHYSICAL EXAMINATION:    Onc Performance Status - 11/30/23 1018       ECOG Perf Status   ECOG Perf Status Restricted in physically strenuous activity but ambulatory and able to carry out work of a light or sedentary nature, e.g., light house work, office work      KPS SCALE   KPS % SCORE Normal activity with effort, some s/s of disease          Vitals:   11/30/23 1010  BP: 97/77  Pulse: (!) 118  Resp: 16  Temp: (!) 97.5 F (36.4 C)  SpO2: 100%   Filed Weights   11/30/23 1010  Weight: 134 lb 4.8 oz (60.9 kg)    Physical Exam Constitutional:      General: She is not in acute distress.    Appearance: Normal appearance.  HENT:     Head: Normocephalic and atraumatic.  Cardiovascular:     Rate and Rhythm: Normal rate.     Heart sounds: Normal heart sounds.  Pulmonary:     Effort: Pulmonary effort is normal. No respiratory distress.     Breath sounds: Normal breath sounds.  Abdominal:     General: There is no distension.  Neurological:     General: No focal deficit present.     Mental Status: She is alert and  oriented to person, place, and time.  Psychiatric:        Mood and Affect: Mood normal.        Behavior: Behavior normal.     LABORATORY DATA:   I have reviewed the data as listed.  Results for orders placed or performed in visit on 11/30/23  D-dimer, quantitative  Result Value Ref Range   D-Dimer, Quant 0.62 (H) 0.00 - 0.50 ug/mL-FEU  CMP (Cancer Center only)  Result Value Ref Range   Sodium 137 135 - 145 mmol/L   Potassium 4.5 3.5 - 5.1 mmol/L   Chloride 102 98 - 111 mmol/L   CO2 22 22 - 32 mmol/L   Glucose, Bld 170 (H) 70 - 99 mg/dL   BUN 36 (H) 6 - 20 mg/dL   Creatinine 8.27 (H) 9.55 - 1.00 mg/dL   Calcium  10.2 8.9 - 10.3 mg/dL   Total Protein 8.0 6.5 - 8.1 g/dL   Albumin 4.2 3.5 - 5.0 g/dL   AST 79 (H) 15 - 41 U/L   ALT 57 (H) 0 - 44 U/L   Alkaline Phosphatase 207 (H) 38 - 126 U/L   Total Bilirubin 0.5 0.0 - 1.2 mg/dL   GFR, Estimated 34 (L) >60 mL/min   Anion gap 12 5 - 15  CBC with Differential (Cancer Center Only)  Result Value Ref Range   WBC Count 11.5 (H) 4.0 - 10.5 K/uL   RBC 3.71 (L) 3.87 - 5.11 MIL/uL   Hemoglobin 10.9 (L) 12.0 - 15.0 g/dL   HCT 67.0 (L) 63.9 - 53.9 %   MCV 88.7 80.0 - 100.0 fL   MCH 29.4 26.0 - 34.0 pg   MCHC 33.1 30.0 - 36.0 g/dL   RDW 85.0 88.4 - 84.4 %   Platelet Count 206 150 - 400 K/uL   nRBC 0.2 0.0 - 0.2 %   Neutrophils Relative % 68 %   Neutro Abs 7.8 (H) 1.7 - 7.7 K/uL   Lymphocytes Relative 25 %   Lymphs Abs 2.9 0.7 - 4.0 K/uL   Monocytes Relative 5 %   Monocytes Absolute 0.5 0.1 - 1.0 K/uL   Eosinophils Relative 1 %   Eosinophils Absolute 0.1 0.0 - 0.5 K/uL   Basophils Relative 0 %   Basophils Absolute 0.0 0.0 - 0.1 K/uL   Immature Granulocytes 1 %   Abs Immature Granulocytes 0.06 0.00 - 0.07 K/uL     RADIOGRAPHIC STUDIES:  I have personally reviewed the radiological images as listed and agreed with the findings in the report.  VAS US  LOWER EXTREMITY VENOUS (DVT)  Lower Venous DVT Study  Patient Name:   Stacy [name]  Date of Exam:   10/28/2023 Medical Rec #: [id number]        Accession #:    [id number] Date of Birth: [birth year]         Patient Gender: F Patient Age:   41 years Exam Location:  [address] Procedure:      VAS US  LOWER EXTREMITY VENOUS (DVT) Referring Phys: CONNIE EMPEROR  --------------------------------------------------------------------------------   Indications: Bilateral lower extremity swelling and edema, right worse than left x 2 weeks. Patient denies any unusual SOB.   Risk Factors: DVT history in the right lower extremity in [address]. Trauma to the right foot; fracture in 2023. Comparison Study: On 02/08/2021, a lower venous duplex showed evidence of acute                   thrombus involving the right CFV, SFJ, FV, Gastroc and  peroneal veins. Age indeterminate thrombus in the popliteal                   vein and PTVs.                     On 08/14/2021, a lower venous duplex showed evidence of                   chronic thrombus in the right popliteal vein. No evidence of                   thrombus in the left lower extremity.  Performing Technologist: Nanetta Shad RVT    Examination Guidelines: A complete evaluation includes B-mode imaging, spectral Doppler, color Doppler, and power Doppler as needed of all accessible portions of each vessel. Bilateral testing is considered an integral part of a complete examination. Limited examinations for reoccurring indications may be performed as noted. The reflux portion of the exam is performed with the patient in reverse Trendelenburg.     +---------+---------------+---------+-----------+---------------+-------------+ RIGHT    CompressibilityPhasicitySpontaneityProperties     Thrombus                                                                 Aging         +---------+---------------+---------+-----------+---------------+-------------+ CFV      Full           Yes      Yes                                      +---------+---------------+---------+-----------+---------------+-------------+ SFJ      Full                                                            +---------+---------------+---------+-----------+---------------+-------------+ FV Prox  Partial        Yes      Yes        brightly       Chronic                                                   echogenic                    +---------+---------------+---------+-----------+---------------+-------------+ FV Mid   Full                                                            +---------+---------------+---------+-----------+---------------+-------------+ FV DistalFull                                                            +---------+---------------+---------+-----------+---------------+-------------+  PFV      Full                                                            +---------+---------------+---------+-----------+---------------+-------------+ POP      Full           Yes      Yes                                     +---------+---------------+---------+-----------+---------------+-------------+ PTV      Full                                                            +---------+---------------+---------+-----------+---------------+-------------+ PERO     Full                                                            +---------+---------------+---------+-----------+---------------+-------------+ GSV      Full                                                            +---------+---------------+---------+-----------+---------------+-------------+ TPT      None           No       No         softly         Acute                                                     echogenic                    +---------+---------------+---------+-----------+---------------+-------------+       Right Technical Findings: Limited  visualization of the peroneal veins due to increase lower leg swelling.   +---------+---------------+---------+-----------+----------+--------------+ LEFT     CompressibilityPhasicitySpontaneityPropertiesThrombus Aging +---------+---------------+---------+-----------+----------+--------------+ CFV      Full           Yes      Yes                                 +---------+---------------+---------+-----------+----------+--------------+ SFJ      Full                                                        +---------+---------------+---------+-----------+----------+--------------+ FV Prox  Full           Yes      Yes                                 +---------+---------------+---------+-----------+----------+--------------+ FV Mid   Full                                                        +---------+---------------+---------+-----------+----------+--------------+ FV DistalFull                                                        +---------+---------------+---------+-----------+----------+--------------+ PFV      Full                                                        +---------+---------------+---------+-----------+----------+--------------+ POP      Full           Yes      Yes                                 +---------+---------------+---------+-----------+----------+--------------+ PTV      Full                                                        +---------+---------------+---------+-----------+----------+--------------+ PERO     Full                                                        +---------+---------------+---------+-----------+----------+--------------+ GSV      Full                                                        +---------+---------------+---------+-----------+----------+--------------+ TPT      Full           Yes      Yes                                  +---------+---------------+---------+-----------+----------+--------------+    Findings reported to Dr. Aletha at 5:00 pm.   Summary: BILATERAL: -No evidence of popliteal cyst, bilaterally.   RIGHT: - Findings consistent with acute deep vein thrombosis involving the tibioperoneal confluence. 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 - All other veins visualized appear fully compressible and demonstrate appropriate Doppler characteristics.  LEFT: - No evidence of deep vein thrombosis in the lower extremity. No indirect evidence of obstruction proximal to the inguinal ligament.       *See table(s) above for measurements and observations.  Electronically signed by Lonni Gaskins MD on 10/29/2023 at 8:47:01 AM.      Final     Orders Placed This Encounter  Procedures   CBC with Differential (Cancer Center Only)    Standing Status:   Future    Number of Occurrences:   1    Expiration Date:   11/29/2024   CMP (Cancer Center only)    Standing Status:   Future    Number of Occurrences:   1    Expiration Date:   11/29/2024   D-dimer, quantitative    Standing Status:   Future    Number of Occurrences:   1    Expiration Date:   11/29/2024   Factor 5 leiden    Standing Status:   Future    Number of Occurrences:   1    Expiration Date:   11/29/2024   Prothrombin gene mutation    Standing Status:   Future    Number of Occurrences:   1    Expiration Date:   11/29/2024   Beta-2-glycoprotein i abs, IgG/M/A    Standing Status:   Future    Number of Occurrences:   1    Expiration Date:   11/29/2024   Cardiolipin antibodies, IgG, IgM, IgA    Standing Status:   Future    Number of Occurrences:   1    Expiration Date:   11/29/2024    Future Appointments  Date Time Provider Department Center  12/14/2023  3:30 PM Autumn Millman, MD [facility] None  01/10/2024  8:40 AM Aletha Bene, MD [facility]  03/28/2024 11:00 AM  [facility] PHLEBOTOMIST [facility] None  03/28/2024 11:30 AM Lenora Gomes, Millman, MD [facility] None    I spent a total of 55 minutes during this encounter with the patient including review of chart and various tests results, discussions about plan of care and coordination of care plan.  This document was completed utilizing speech recognition software. Grammatical errors, random word insertions, pronoun errors, and incomplete sentences are an occasional consequence of this system due to software limitations, ambient noise, and hardware issues. Any formal questions or concerns about the content, text or information contained within the body of this dictation should be directly addressed to the provider for clarification.

## 2023-12-14 ENCOUNTER — Inpatient Hospital Stay: Admitting: Oncology

## 2023-12-14 ENCOUNTER — Telehealth: Payer: Self-pay | Admitting: Oncology

## 2023-12-14 NOTE — Telephone Encounter (Signed)
 Called PT to let her know labs haven't resulted and need to reschedule lab appt.  Updated appt day and time confirmed with PT.

## 2023-12-21 ENCOUNTER — Inpatient Hospital Stay: Attending: Oncology | Admitting: Oncology

## 2023-12-21 ENCOUNTER — Telehealth: Payer: Self-pay

## 2023-12-21 NOTE — Telephone Encounter (Signed)
 A voicemail was left with patient letting her know that her phone appointment with Dr. Autumn has to be rescheduled due to further communications needed with [facility] requesting lab results. Scheduler made aware.

## 2023-12-22 ENCOUNTER — Inpatient Hospital Stay: Admitting: Oncology

## 2023-12-22 DIAGNOSIS — I82491 Acute embolism and thrombosis of other specified deep vein of right lower extremity: Secondary | ICD-10-CM

## 2023-12-22 LAB — PROTHROMBIN GENE MUTATION

## 2023-12-22 LAB — BETA-2-GLYCOPROTEIN I ABS, IGG/M/A

## 2023-12-22 LAB — CARDIOLIPIN ANTIBODIES, IGG, IGM, IGA

## 2023-12-22 LAB — FACTOR 5 LEIDEN

## 2023-12-22 NOTE — Progress Notes (Signed)
 "  [facility]  HEMATOLOGY-ONCOLOGY ELECTRONIC VISIT PROGRESS NOTE  PATIENT NAME: Stacy [name]   MR#: [id number] DOB: 1965-03-24  DATE OF SERVICE: 12/22/2023  Patient Care Team: Aletha Bene, MD as PCP - General (Family Medicine) Raford Riggs, MD as PCP - Cardiology (Cardiology)  I connected with the patient via telephone conference and verified that I am speaking with the correct person using two identifiers. The patient's location is at home and I am providing care from the [facility].  I discussed the limitations, risks, security and privacy concerns of performing an evaluation and management service by e-visits and the availability of in person appointments. I also discussed with the patient that there may be a patient responsible charge related to this service. The patient expressed understanding and agreed to proceed.   ASSESSMENT & PLAN:   Stacy [name] is a 58 y.o. lady with a past medical history of DVT in the right lower extremity in March 2023 provoked by a foot fracture at that time, alcohol dependence, fatty liver disease, COPD, diabetes mellitus, dyslipidemia, hypertension, nicotine  dependence, was referred to our service in November 2025 for evaluation after she was diagnosed with right lower extremity DVT on 10/28/2023.   Acute deep vein thrombosis (DVT) of right lower extremity (HCC) On 10/28/2023, bilateral lower extremity ultrasound was obtained.  It showed acute DVT involving the tibioperoneal confluence in the right lower extremity.  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  No evidence of DVT in the left lower extremity.   Acute DVT in the right lower extremity with no recent surgeries, trauma, or prolonged immobility.   Previous DVT in 2023 associated with a foot fracture.    Current DVT lacks a clear trigger, necessitating further investigation for potential thrombophilia.  On her consultation with us  on  11/30/2023, we obtained additional workup.  There was no evidence of anticardiolipin antibodies or beta-2  glycoprotein antibodies.  Factor V Leiden mutation and prothrombin gene mutation were both negative.  D-dimer was 0.62.  Request placed for repeat ultrasound of the right lower extremity before return visit.  Further treatment issues will be dependent on findings.  - Continue Eliquis  5 mg twice daily.  - Plan for a follow-up ultrasound before the next visit.  - Scheduled follow-up appointment in four months.  Fatty liver disease associated with alcohol use Elevated liver enzymes likely due to alcohol consumption. Current intake is two drinks per day, contributing to liver enzyme elevation. Previous liver enzyme levels were significantly higher in January. - Advised to limit alcohol intake to one drink per day  Anemia Slightly low hemoglobin at 10.9, possibly due to anticoagulation therapy with Eliquis . - Continue to monitor hemoglobin levels  Chronic kidney disease Slightly elevated creatinine at 1.72, indicating mild renal impairment. Previous acute renal failure resolved without specialist referral. - Continue to monitor renal function  I discussed the assessment and treatment plan with the patient. The patient was provided an opportunity to ask questions and all were answered. The patient agreed with the plan and demonstrated an understanding of the instructions. The patient was advised to call back or seek an in-person evaluation if the symptoms worsen or if the condition fails to improve as anticipated.    I spent 12 minutes over the phone with the patient reviewing test results, discuss management and coordination/planning of care.  Chinita Patten, MD 12/22/2023 4:33 PM [facility] - A DEPT  OF [facility] [address] [place] [address] Dept: [phone] Dept Fax: [phone]   INTERVAL  HISTORY:  Please see above for problem oriented charting.  The purpose of today's discussion is to explain recent lab results and to formulate plan of care.  Discussed the use of AI scribe software for clinical note transcription with the patient, who gave verbal consent to proceed.  History of Present Illness Stacy [name] is a 58 year old female who presents with weakness and inability to eat.  She has been experiencing weakness and an inability to eat for a couple of days. No fever is present, but she feels 'run down' due to the holidays.  She has a history of a clot in the right leg and is currently on blood thinners, specifically Eliquis . Recent lab work showed slight abnormalities with a hemoglobin level of 10.9 and a [place] blood cell count of 11,500. Platelet count was normal. A D-dimer test was performed to check for new clotting, which was slightly elevated at 0.6. Genetic testing for clotting risk mutations returned negative results, although some tests could not be performed due to the recent clotting event.  Her past medical history includes acute renal failure, which resolved before hospital discharge. Her recent creatinine level was 1.72. Liver function tests showed three elevated liver enzymes. She consumes about two alcoholic drinks per day. She recalls being hospitalized in December of the previous year, which coincided with a significant elevation in liver enzymes.   SUMMARY OF HEMATOLOGY HISTORY:  She noticed swelling in her foot and ankle, which led to a visit to her primary doctor.   On 10/28/2023, bilateral lower extremity ultrasound was obtained.  It showed acute DVT involving the tibioperoneal confluence in the right lower extremity.  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  No evidence of DVT in the left lower extremity.    She has no recent history of surgeries, trauma, or prolonged immobility. She has a history of a previous blood  clot approximately two and a half years ago, associated with a foot fracture, treated with a wooden boot and six months of anticoagulation.   She experiences fatigue and tends to be inactive due to tiredness. She is not currently working. No known family history of blood clots. No recent long trips or flights. She denies leg pain but reports back pain. No chest pain or difficulty breathing. She has experienced weight loss due to a lack of appetite.   She has a history of a lung nodule in the left upper lobe, initially detected on a CT scan. She is currently seeing a lung specialist and has an appointment next week for further evaluation.   She smokes about a third of a pack of cigarettes daily and has been smoking for over twenty years. She drinks alcohol socially, averaging about three drinks per week, but notes an increase in consumption recently due to depression, particularly around the holidays following the unexpected loss of her husband.   She was recently diagnosed with COPD and uses a inhaler. She is currently on Eliquis , 5 mg twice daily.   Negative Thrombotic Risk Factors:  Recent surgery (within 3 months), Recent trauma (within 3 months), Recent admission to hospital with acute illness (within 3 months), Paralysis, paresis, or recent plaster cast immobilization of lower extremity, Central venous catheterization, Bed rest >72 hours within 3 months, Sedentary journey lasting >8 hours within 4 weeks, Estrogen therapy, Erythropoiesis-stimulating agent,  Recent COVID diagnosis (within 3 months), Active cancer, Non-malignant, chronic inflammatory condition, Known thrombophilic condition, Obesity, Older age    She denies fever, cough, diarrhea, or other infectious symptoms.  She denies epistaxis, bloody stool, melena, hematuria, bruising or other bleeding symptoms. She also denies unintentional weight loss, night sweats or other constitutional symptoms.  Previous DVT in 2023 associated with a foot  fracture.    Current DVT lacks a clear trigger, necessitating further investigation for potential thrombophilia.  On her consultation with us  on 11/30/2023, we obtained additional workup.  There was no evidence of anticardiolipin antibodies or beta-2  glycoprotein antibodies.  Factor V Leiden mutation and prothrombin gene mutation were both negative.  D-dimer was 0.62.  Request placed for repeat ultrasound of the right lower extremity before return visit.  Further treatment issues will be dependent on findings.  REVIEW OF SYSTEMS:    Review of Systems - Oncology  All other pertinent systems were reviewed with the patient and are negative.  I have reviewed the past medical history, past surgical history, social history and family history with the patient and they are unchanged from previous note.  ALLERGIES:  She is allergic to almond meal (obsolete), other, and aspirin .  MEDICATIONS:  Current Outpatient Medications  Medication Sig Dispense Refill   albuterol  (VENTOLIN  HFA) 108 (90 Base) MCG/ACT inhaler Inhale 2 puffs into the lungs every 4 (four) hours as needed for wheezing or shortness of breath. 6.7 each 1   allopurinol  (ZYLOPRIM ) 300 MG tablet Take 1 tablet (300 mg total) by mouth daily. 90 tablet 1   amLODipine  (NORVASC ) 10 MG tablet Take 1 tablet (10 mg total) by mouth daily. 90 tablet 2   apixaban  (ELIQUIS ) 5 MG TABS tablet Take 1 tablet (5 mg total) by mouth 2 (two) times daily. Continue taking after starter pack 60 tablet 1   APIXABAN  (ELIQUIS ) VTE STARTER PACK (10MG  AND 5MG ) Take as directed on package: start with two-5mg  tablets twice daily for 7 days. On day 8, switch to one-5mg  tablet twice daily. 74 each 0   azelastine  (ASTELIN ) 0.1 % nasal spray Place 2 sprays into both nostrils 2 (two) times daily. Use in each nostril as directed 30 mL 12   colchicine  0.6 MG tablet Take 1 tablet (0.6 mg total) by mouth 2 (two) times daily as needed (gout flare). 30 tablet 1   EPINEPHrine  0.3  mg/0.3 mL IJ SOAJ injection Insert into muscle once with any facial or neck swelling  Instruct in use 1 Device 1   escitalopram  (LEXAPRO ) 20 MG tablet Take 1 tablet (20 mg total) by mouth daily. 60 tablet 2   ferrous sulfate  325 (65 FE) MG EC tablet Take 1 tablet (325 mg total) by mouth daily with breakfast.     folic acid  (FOLVITE ) 1 MG tablet Take 1 tablet (1 mg total) by mouth daily. 90 tablet 1   hydrochlorothiazide  (HYDRODIURIL ) 25 MG tablet Take 1 tablet (25 mg total) by mouth daily. 90 tablet 1   hydrOXYzine (ATARAX) 25 MG tablet Take 25 mg by mouth daily as needed.     Lancets (FREESTYLE) lancets Use as instructed 100 each 12   metFORMIN  (GLUCOPHAGE -XR) 500 MG 24 hr tablet Take 2 tablets (1,000 mg total) by mouth daily with supper. 120 tablet 1   mirtazapine  (REMERON  SOL-TAB) 15 MG disintegrating tablet Take 1 tablet (15 mg total) by mouth at bedtime as needed. 90 tablet 1   Multiple Vitamin (MULTIVITAMIN WITH MINERALS) TABS tablet Take 1 tablet by  mouth daily.     olopatadine  (PATANOL) 0.1 % ophthalmic solution Place 1 drop into both eyes 2 (two) times daily. 5 mL 12   pantoprazole  (PROTONIX ) 40 MG tablet Take 1 tablet (40 mg total) by mouth daily. 90 tablet 1   rosuvastatin  (CRESTOR ) 20 MG tablet Take 1 tablet (20 mg total) by mouth daily. 90 tablet 2   spironolactone  (ALDACTONE ) 25 MG tablet Take 1 tablet (25 mg total) by mouth daily. 90 tablet 3   valsartan  (DIOVAN ) 320 MG tablet Take 1 tablet (320 mg total) by mouth daily. 90 tablet 3   varenicline  (CHANTIX ) 0.5 MG tablet Take 1 tablet (0.5 mg total) by mouth 2 (two) times daily. 60 tablet 2   VRAYLAR 3 MG capsule Take 3 mg by mouth daily.     No current facility-administered medications for this visit.    PHYSICAL EXAMINATION:  Not performed today as it was a phone only visit  LABORATORY DATA:   I have reviewed the data as listed.  Recent Results (from the past 2160 hours)  Comprehensive metabolic panel with GFR      Status: Abnormal   Collection Time: 10/27/23  3:13 PM  Result Value Ref Range   Glucose, Bld 205 (H) 65 - 99 mg/dL    Comment: .            Fasting reference interval . For someone without known diabetes, a glucose value >125 mg/dL indicates that they may have diabetes and this should be confirmed with a follow-up test. .    BUN 23 7 - 25 mg/dL   Creat 8.90 (H) 9.49 - 1.03 mg/dL   eGFR 59 (L) > OR = 60 mL/min/1.34m2   BUN/Creatinine Ratio 21 6 - 22 (calc)   Sodium 137 135 - 146 mmol/L   Potassium 3.8 3.5 - 5.3 mmol/L   Chloride 97 (L) 98 - 110 mmol/L   CO2 31 20 - 32 mmol/L   Calcium  10.1 8.6 - 10.4 mg/dL   Total Protein 7.2 6.1 - 8.1 g/dL   Albumin 3.9 3.6 - 5.1 g/dL   Globulin 3.3 1.9 - 3.7 g/dL (calc)   AG Ratio 1.2 1.0 - 2.5 (calc)   Total Bilirubin 0.7 0.2 - 1.2 mg/dL   Alkaline phosphatase (APISO) 260 (H) 37 - 153 U/L   AST 50 (H) 10 - 35 U/L   ALT 133 (H) 6 - 29 U/L  CBC with Differential/Platelet     Status: Abnormal   Collection Time: 10/27/23  3:13 PM  Result Value Ref Range   WBC 9.8 3.8 - 10.8 Thousand/uL   RBC 4.09 3.80 - 5.10 Million/uL   Hemoglobin 12.5 11.7 - 15.5 g/dL   HCT 63.7 64.9 - 54.9 %   MCV 88.5 80.0 - 100.0 fL   MCH 30.6 27.0 - 33.0 pg   MCHC 34.5 32.0 - 36.0 g/dL    Comment: For adults, a slight decrease in the calculated MCHC value (in the range of 30 to 32 g/dL) is most likely not clinically significant; however, it should be interpreted with caution in correlation with other red cell parameters and the patient's clinical condition.    RDW 15.4 (H) 11.0 - 15.0 %   Platelets 196 140 - 400 Thousand/uL   MPV 11.4 7.5 - 12.5 fL   Neutro Abs 5,821 1,500 - 7,800 cells/uL   Absolute Lymphocytes 3,058 850 - 3,900 cells/uL   Absolute Monocytes 755 200 - 950 cells/uL   Eosinophils Absolute 108 15 -  500 cells/uL   Basophils Absolute 59 0 - 200 cells/uL   Neutrophils Relative % 59.4 %   Total Lymphocyte 31.2 %   Monocytes Relative 7.7 %    Eosinophils Relative 1.1 %   Basophils Relative 0.6 %  Urinalysis, Routine w reflex microscopic     Status: Abnormal   Collection Time: 10/27/23  3:33 PM  Result Value Ref Range   Color, Urine YELLOW YELLOW   APPearance CLEAR CLEAR   Specific Gravity, Urine 1.017 1.001 - 1.035   pH 5.5 5.0 - 8.0   Glucose, UA NEGATIVE NEGATIVE   Bilirubin Urine NEGATIVE NEGATIVE   Ketones, ur NEGATIVE NEGATIVE   Hgb urine dipstick NEGATIVE NEGATIVE   Protein, ur 1+ (A) NEGATIVE   Nitrite NEGATIVE NEGATIVE   Leukocytes,Ua NEGATIVE NEGATIVE   WBC, UA 0-5 0 - 5 /HPF   RBC / HPF NONE SEEN 0 - 2 /HPF   Squamous Epithelial / HPF 0-5 < OR = 5 /HPF   Bacteria, UA NONE SEEN NONE SEEN /HPF   Hyaline Cast NONE SEEN NONE SEEN /LPF  MICROSCOPIC MESSAGE     Status: None   Collection Time: 10/27/23  3:33 PM  Result Value Ref Range   Note      Comment: This urine was analyzed for the presence of WBC,  RBC, bacteria, casts, and other formed elements.  Only those elements seen were reported. . .   Cardiolipin antibodies, IgG, IgM, IgA     Status: None   Collection Time: 11/30/23 11:04 AM  Result Value Ref Range   Anticardiolipin IgG See Scanned report in [place] Link    Anticardiolipin IgM See Scanned report in [place] Link    Anticardiolipin IgA See Scanned report in [place] Link     Comment: Performed at [facility], [address] [place], [place] [address]  Beta-2 -glycoprotein i abs, IgG/M/A     Status: None   Collection Time: 11/30/23 11:04 AM  Result Value Ref Range   Beta-2  Glyco I IgG See Scanned report in [place] Link    Beta-2 -Glycoprotein I IgM See Scanned report in [place] Link    Beta-2 -Glycoprotein I IgA See Scanned report in [place] Link     Comment: Performed at [facility], [address], [place], [place] [address]  Prothrombin gene mutation     Status: None   Collection Time: 11/30/23 11:04 AM  Result Value Ref Range    Recommendations-PTGENE: See Scanned report in [place] Link    Reviewed by: See Scanned report in [place] Link     Comment: Performed at [facility], [address], [place], [place] [address]  Factor 5 leiden     Status: None   Collection Time: 11/30/23 11:04 AM  Result Value Ref Range   Recommendations-F5LEID: See Scanned report in [place] Link    Reviewed By: See Scanned report in [place] Link     Comment: Performed at [facility], [address], [place], [place] [address]  D-dimer, quantitative     Status: Abnormal   Collection Time: 11/30/23 11:04 AM  Result Value Ref Range   D-Dimer, Quant 0.62 (H) 0.00 - 0.50 ug/mL-FEU    Comment: (NOTE) At the manufacturer cut-off value of 0.5 g/mL FEU, this assay has a negative predictive value of 95-100%.This assay is intended for use in conjunction with a clinical pretest probability (PTP) assessment model to exclude pulmonary embolism (PE) and deep venous  thrombosis (DVT) in outpatients suspected of PE or DVT. Results should be correlated with clinical presentation. Performed at [facility], [address], [place], [place] [address]   CMP (Cancer Center only)     Status: Abnormal   Collection Time: 11/30/23 11:04 AM  Result Value Ref Range   Sodium 137 135 - 145 mmol/L   Potassium 4.5 3.5 - 5.1 mmol/L   Chloride 102 98 - 111 mmol/L   CO2 22 22 - 32 mmol/L   Glucose, Bld 170 (H) 70 - 99 mg/dL    Comment: Glucose reference range applies only to samples taken after fasting for at least 8 hours.   BUN 36 (H) 6 - 20 mg/dL   Creatinine 8.27 (H) 9.55 - 1.00 mg/dL   Calcium  10.2 8.9 - 10.3 mg/dL   Total Protein 8.0 6.5 - 8.1 g/dL   Albumin 4.2 3.5 - 5.0 g/dL   AST 79 (H) 15 - 41 U/L   ALT 57 (H) 0 - 44 U/L   Alkaline Phosphatase 207 (H) 38 - 126 U/L   Total Bilirubin 0.5 0.0 - 1.2 mg/dL   GFR, Estimated 34 (L) >60 mL/min    Comment: (NOTE) Calculated using  the CKD-EPI Creatinine Equation (2021)    Anion gap 12 5 - 15    Comment: Performed at [facility], [address] [place], [place] [address]  CBC with Differential (Cancer Center Only)     Status: Abnormal   Collection Time: 11/30/23 11:04 AM  Result Value Ref Range   WBC Count 11.5 (H) 4.0 - 10.5 K/uL   RBC 3.71 (L) 3.87 - 5.11 MIL/uL   Hemoglobin 10.9 (L) 12.0 - 15.0 g/dL   HCT 67.0 (L) 63.9 - 53.9 %   MCV 88.7 80.0 - 100.0 fL   MCH 29.4 26.0 - 34.0 pg   MCHC 33.1 30.0 - 36.0 g/dL   RDW 85.0 88.4 - 84.4 %   Platelet Count 206 150 - 400 K/uL   nRBC 0.2 0.0 - 0.2 %   Neutrophils Relative % 68 %   Neutro Abs 7.8 (H) 1.7 - 7.7 K/uL   Lymphocytes Relative 25 %   Lymphs Abs 2.9 0.7 - 4.0 K/uL   Monocytes Relative 5 %   Monocytes Absolute 0.5 0.1 - 1.0 K/uL   Eosinophils Relative 1 %   Eosinophils Absolute 0.1 0.0 - 0.5 K/uL   Basophils Relative 0 %   Basophils Absolute 0.0 0.0 - 0.1 K/uL   Immature Granulocytes 1 %   Abs Immature Granulocytes 0.06 0.00 - 0.07 K/uL    Comment: Performed at [facility], [address] [place], [place] [address]     RADIOGRAPHIC STUDIES:  No recent pertinent imaging studies available to review.  No orders of the defined types were placed in this encounter.    Future Appointments  Date Time Provider Department Center  01/10/2024  8:40 AM Aletha Bene, MD [facility]  03/28/2024 11:00 AM [facility] PHLEBOTOMIST [facility] None  03/28/2024 11:30 AM Lui Bellis, Chinit[place], MD [facility] None    This document was completed utilizing speech recognition software. Grammatical errors, random word insertions, pronoun errors, and incomplete sentences are an occasional consequence of this system due to software limitations, ambient noise, and hardware issues. Any formal questions or concerns about the content, text or information contained within the body of this dictation should be directly addressed to the provider  for clarification.  "

## 2023-12-23 ENCOUNTER — Other Ambulatory Visit: Payer: Self-pay

## 2023-12-23 DIAGNOSIS — I82491 Acute embolism and thrombosis of other specified deep vein of right lower extremity: Secondary | ICD-10-CM

## 2023-12-24 ENCOUNTER — Telehealth: Payer: Self-pay

## 2023-12-24 NOTE — Telephone Encounter (Signed)
 Opened in error

## 2023-12-24 NOTE — Telephone Encounter (Signed)
 Spoke with patient on phone just now to remind her about her US  scheduled at [facility] on March 3rd at 11:00 am. Patient understood to arrive 15 minutes early and agreed to all other details.

## 2023-12-29 ENCOUNTER — Encounter (HOSPITAL_BASED_OUTPATIENT_CLINIC_OR_DEPARTMENT_OTHER): Payer: Self-pay | Admitting: Cardiovascular Disease

## 2024-01-10 ENCOUNTER — Ambulatory Visit (INDEPENDENT_AMBULATORY_CARE_PROVIDER_SITE_OTHER): Admitting: Family Medicine

## 2024-01-10 ENCOUNTER — Encounter: Payer: Self-pay | Admitting: Family Medicine

## 2024-01-10 VITALS — BP 110/70 | HR 107 | Temp 98.4°F | Ht 63.0 in | Wt 134.2 lb

## 2024-01-10 DIAGNOSIS — Z720 Tobacco use: Secondary | ICD-10-CM

## 2024-01-10 DIAGNOSIS — I82491 Acute embolism and thrombosis of other specified deep vein of right lower extremity: Secondary | ICD-10-CM

## 2024-01-10 DIAGNOSIS — E1169 Type 2 diabetes mellitus with other specified complication: Secondary | ICD-10-CM | POA: Diagnosis not present

## 2024-01-10 DIAGNOSIS — M79674 Pain in right toe(s): Secondary | ICD-10-CM | POA: Diagnosis not present

## 2024-01-10 DIAGNOSIS — I10 Essential (primary) hypertension: Secondary | ICD-10-CM | POA: Diagnosis not present

## 2024-01-10 DIAGNOSIS — D649 Anemia, unspecified: Secondary | ICD-10-CM | POA: Diagnosis not present

## 2024-01-10 DIAGNOSIS — Z7984 Long term (current) use of oral hypoglycemic drugs: Secondary | ICD-10-CM | POA: Diagnosis not present

## 2024-01-10 DIAGNOSIS — Z0000 Encounter for general adult medical examination without abnormal findings: Secondary | ICD-10-CM | POA: Diagnosis not present

## 2024-01-10 DIAGNOSIS — F418 Other specified anxiety disorders: Secondary | ICD-10-CM

## 2024-01-10 NOTE — Progress Notes (Addendum)
 "  Complete physical exam  Assessment & Plan:e    Routine Health Maintenance and Physical Exam Discussed health benefits of physical activity, and encouraged her to engage in regular exercise appropriate for her age and condition.  Preventative health care -     CBC with Differential/Platelet -     Comprehensive metabolic panel with GFR -     Lipid panel -     TSH -     PAP, Thin Prep w/HPV rflx HPV Type 16/18  Pain of toe of right foot  Acute deep vein thrombosis (DVT) of other specified vein of right lower extremity (HCC)  Anemia, unspecified type -     CBC with Differential/Platelet  Depression with anxiety -     Ambulatory referral to Psychiatry  Primary hypertension  Type 2 diabetes mellitus with other specified complication, without long-term current use of insulin  (HCC) -     Hemoglobin A1c  Tobacco use   Pt will contact her Ortho re right toe pain? Fracture Recommend tobacco cessation and alcohol cessation Behavioral health/psychiatry consult ordered.  Recommend healthy diet i.e mediterranean/DASH diet, consistent exercise - 30 minutes 5 day per week (as tolerated) Return in about 3 months (around 04/09/2024), or if symptoms worsen or fail to improve.        Subjective:  Patient ID: Stacy [name], female    DOB: March 13, 1965  Age: 58 y.o. MRN: [id number] Chief Complaint  Patient presents with   Annual Exam    Pap and cpe.   Toe Injury    Pt fell this morning and injured her R great toe.    Stacy [name] is a 58 y.o. female who presents today for a complete physical exam. Colonoscopy-UTD, due 2028 Tetanus not yet Shingrix not yet Pap smear 2019- negative per patient done in [place] by OB-GYN History of Present Illness Stacy [name] is a 58 year old female who presents with right big toe pain after trauma. Patient is here for a physical exam  and pap smear.   She experiences significant pain and swelling in her right big toe after accidentally hitting it  against a bedpost while rushing this morning. The pain is severe enough to cause a limp, although she remains able to walk. The incident occurred today, and the toe is beginning to swell.  She is currently on Eliquis , a blood thinner, since October 8th due to a previous unprovoked clot in her right ankle area. She reports no bruising while on Eliquis . She is awaiting further evaluation from her hematologist. This is her 2nd blood clot  She experiences some shortness of breath when climbing stairs but manages well with her current medication, Trelegy. She is seeking a new behavioral health specialist after difficulties with her previous provider's telehealth system.  Socially, she is working on quitting smoking and is using a menthol vaporizer to manage cravings. She drinks alcohol socially, about twice a month, but finds it harder to control during the holiday season. She travels occasionally to care for her father in [place] Stacy [name] . Type 2 diabetes-previously uncontrolled.  Denies diarrhea, peripheral swelling, hypoglycemia, excessive thirst, excessive urination, visual fluctuation, and fatigue.  Making an effort on diet control and exercise.  Has an up-to-date dilated eye exam.  Using medication for diabetes  Patient's weight remains stable.  Patient has had decreased appetite Patient had CT scan lung cancer screening done November 2025 at [facility]- needs repeat in 6 months.  OVERALL LUNG-RADS CATEGORY: 3, probably benign  BASED  ON LESION: ID a 10 x 5 mm irregular, somewhat linear nodule in the anterior left upper lobe is unchanged since 08/18/2023  MANAGEMENT RECOMMENDATION: Follow-up low dose chest CT in 6 months  FOLLOW-UP DATE: 05/21/2024  Patient sees cardiology at [facility] due to insurance change Physical Exam GENITOURINARY: External vagina normal.  Results Papanicolaou smear Speculum inserted into vagina. Cervical cytology sample obtained. Patient tolerated procedure  well.  Assessment and Plan Right great toe injury Acute injury with swelling and pain, likely traumatic. - Consulted Dr. Barbarann for further evaluation and management.  Acute deep vein thrombosis of right lower extremity On Eliquis  with no significant side effects. Hematologist recommended follow-up scan/doppler - Continue Eliquis  as prescribed. - Follow up with hematologist for scan and further management.  Anemia Mild anemia likely related to Eliquis . No significant symptoms. - Monitor anemia symptoms and follow up with hematologist. Palpitations- sees cardiology at [facility].  Depression with anxiety Ongoing depression and anxiety. Difficulty with telehealth. Seeking new behavioral health specialist. - Referred to psychiatry and psychotherapy services in [place]. Type 2 diabetes- will recheck labs today.  Tobacco use- recommend stopping her menthol cigs  General health maintenance Up to date on mammograms and colonoscopies. Received flu shot. Pap smear repeated today - Obtain tetanus and shingles vaccines at pharmacy. Patient has concerns re Shingrix due to mom getting Guillaine Barre Syndrome - Continue routine health maintenance screenings as scheduled. The ASCVD Risk score (Arnett DK, et al., 2019) failed to calculate for the following reasons:   The valid HDL cholesterol range is 20 to 100 mg/dL  Health Maintenance  Topic Date Due   Hepatitis B Vaccine (1 of 3 - 19+ 3-dose series) Never done   COVID-19 Vaccine (3 - Moderna risk series) 03/24/2019   Complete foot exam   04/09/2023   Pap with HPV screening  11/05/2023   Yearly kidney health urinalysis for diabetes  01/12/2024   Zoster (Shingles) Vaccine (1 of 2) 04/09/2024*   Hemoglobin A1C  03/18/2024   Eye exam for diabetics  07/04/2024   Screening for Lung Cancer  08/17/2024   Breast Cancer Screening  08/19/2024   Yearly kidney function blood test for diabetes  11/29/2024   Colon Cancer Screening  12/24/2026    Pneumococcal Vaccine for age over 72  Completed   Flu Shot  Completed   Hepatitis C Screening  Completed   HIV Screening  Completed   HPV Vaccine  Aged Out   Meningitis B Vaccine  Aged Out   DTaP/Tdap/Td vaccine  Discontinued  *Topic was postponed. The date shown is not the original due date.    Most recent fall risk assessment:    07/06/2023   12:39 PM  Fall Risk   Falls in the past year? 0  Number falls in past yr: 0  Injury with Fall? 0      Data saved with a previous flowsheet row definition     Most recent depression screenings:    01/10/2024    8:45 AM 11/30/2023   11:26 AM  PHQ 2/9 Scores  PHQ - 2 Score 0 0  PHQ- 9 Score 5       The ASCVD Risk score (Arnett DK, et al., 2019) failed to calculate for the following reasons:   The valid HDL cholesterol range is 20 to 100 mg/dL  Past Surgical History:  Procedure Laterality Date   ANAL FISTULECTOMY  01/19/1985   BALLOON DILATION N/A 12/23/2021   Procedure: BALLOON DILATION;  Surgeon: Avram Lupita BRAVO,  MD;  Location: [facility];  Service: Gastroenterology;  Laterality: N/A;   BIOPSY  12/23/2021   Procedure: BIOPSY;  Surgeon: Avram Lupita BRAVO, MD;  Location: [facility];  Service: Gastroenterology;;   BREAST EXCISIONAL BIOPSY Left    BREAST LUMPECTOMY Left 1988   COLONOSCOPY WITH PROPOFOL  N/A 12/23/2021   Procedure: COLONOSCOPY WITH PROPOFOL ;  Surgeon: Avram Lupita BRAVO, MD;  Location: [facility];  Service: Gastroenterology;  Laterality: N/A;   ESOPHAGOGASTRODUODENOSCOPY (EGD) WITH PROPOFOL  N/A 12/23/2021   Procedure: ESOPHAGOGASTRODUODENOSCOPY (EGD) WITH PROPOFOL ;  Surgeon: Avram Lupita BRAVO, MD;  Location: [facility];  Service: Gastroenterology;  Laterality: N/A;   PAROTIDECTOMY  03/30/2011   Procedure: PAROTIDECTOMY;  Surgeon: Ida Loader, MD;  Location: [facility];  Service: ENT;  Laterality: Right;   WRIST GANGLION EXCISION Left 1988   Social History[1] Social History   Socioeconomic History    Marital status: Widowed    Spouse name: Not on file   Number of children: 2   Years of education: Not on file   Highest education level: Not on file  Occupational History    Employer: [employer]  Tobacco Use   Smoking status: Every Day    Current packs/day: 1.00    Average packs/day: 1 pack/day for 28.0 years (28.0 ttl pk-yrs)    Types: Cigarettes   Smokeless tobacco: Never  Vaping Use   Vaping status: Never Used  Substance and Sexual Activity   Alcohol use: Yes    Alcohol/week: 14.0 standard drinks of alcohol    Types: 14 Standard drinks or equivalent per week    Comment: occ   Drug use: No   Sexual activity: Not Currently    Birth control/protection: Post-menopausal  Other Topics Concern   Not on file  Social History Narrative   Widower for the past 10 years, has 2 children. Ages 62, 68 y/o ( entered 09/2012)Works with [employer]   Social Drivers of Health   Tobacco Use: High Risk (01/10/2024)   Patient History    Smoking Tobacco Use: Every Day    Smokeless Tobacco Use: Never    Passive Exposure: Not on file  Financial Resource Strain: Not on file  Food Insecurity: No Food Insecurity (11/30/2023)   Epic    Worried About Programme Researcher, Broadcasting/film/video in the Last Year: Never true    Ran Out of Food in the Last Year: Never true  Transportation Needs: No Transportation Needs (11/30/2023)   Epic    Lack of Transportation (Medical): No    Lack of Transportation (Non-Medical): No  Physical Activity: Not on file  Stress: Not on file  Social Connections: Not on file  Intimate Partner Violence: Not At Risk (11/30/2023)   Epic    Fear of Current or Ex-Partner: No    Emotionally Abused: No    Physically Abused: No    Sexually Abused: No  Depression (PHQ2-9): Medium Risk (01/10/2024)   Depression (PHQ2-9)    PHQ-2 Score: 5  Alcohol Screen: High Risk (03/26/2021)   Alcohol Screen    Last Alcohol Screening Score (AUDIT): 17  Housing: Low Risk  (11/30/2023)   Epic    Unable to Pay for Housing in the Last Year: No    Number of Times Moved in the Last Year: 0    Homeless in the Last Year: No  Utilities: Not At Risk (11/30/2023)   Epic    Threatened with loss of utilities: No  Health Literacy: Adequate Health Literacy (12/29/2022)  B1300 Health Literacy    Frequency of need for help with medical instructions: Never   Family History  Problem Relation Age of Onset   Hypertension Mother    Stroke Mother    Coronary artery disease Father 72       CAD,CABG   Diabetes Sister        uncontrolled   Cancer Sister 58       uterine cancer   Aneurysm Sister        brain   Heart attack Maternal Grandmother    Blindness Paternal Grandmother    Breast cancer Neg Hx    Colon cancer Neg Hx    Esophageal cancer Neg Hx    Rectal cancer Neg Hx    Stomach cancer Neg Hx    Allergies[2]   Patient Care Team: Aletha Bene, MD as PCP - General (Family Medicine) Raford Riggs, MD as PCP - Cardiology (Cardiology)   Show/hide medication list[3]  ROS     Objective:    BP 110/70   Pulse (!) 107   Temp 98.4 F (36.9 C)   Ht 5' 3 (1.6 m)   Wt 134 lb 4 oz (60.9 kg)   SpO2 98%   BMI 23.78 kg/m  BP Readings from Last 3 Encounters:  01/10/24 110/70  11/30/23 97/77  10/27/23 122/80   Wt Readings from Last 3 Encounters:  01/10/24 134 lb 4 oz (60.9 kg)  11/30/23 134 lb 4.8 oz (60.9 kg)  10/27/23 142 lb 8 oz (64.6 kg)    Physical Exam Vitals and nursing note reviewed.  Constitutional:      General: She is not in acute distress.    Appearance: Normal appearance.  HENT:     Head: Normocephalic.     Right Ear: Tympanic membrane, ear canal and external ear normal.     Left Ear: Tympanic membrane, ear canal and external ear normal.  Eyes:     Extraocular Movements: Extraocular movements intact.     Conjunctiva/sclera: Conjunctivae normal.     Pupils: Pupils are equal, round, and reactive to light.  Cardiovascular:      Rate and Rhythm: Regular rhythm. Tachycardia present.     Heart sounds: Normal heart sounds.  Pulmonary:     Effort: Pulmonary effort is normal.     Breath sounds: Normal breath sounds. No wheezing or rhonchi.  Abdominal:     General: Bowel sounds are normal.     Tenderness: There is no abdominal tenderness. There is no guarding.  Genitourinary:    Pubic Area: No rash.      Labia:        Right: No rash.        Left: No rash.      Vagina: No vaginal discharge, erythema, tenderness, bleeding or lesions.     Cervix: No cervical motion tenderness or discharge.     Adnexa: Right adnexa normal and left adnexa normal.       Right: No mass.         Left: No mass.       Rectum: Normal. No tenderness.     Comments: Rectal- grossly heme negative.  Musculoskeletal:     Right lower leg: No edema.     Left lower leg: No edema.     Right foot: Swelling and bony tenderness present.     Comments: Tenderness over right great toe  Skin:    General: Skin is warm.  Neurological:     General: No focal  deficit present.     Mental Status: She is alert and oriented to person, place, and time. Mental status is at baseline.     Motor: No weakness.     Coordination: Coordination normal.  Psychiatric:        Mood and Affect: Mood normal.        Behavior: Behavior normal.        Thought Content: Thought content normal.        Judgment: Judgment normal.      No results found for any visits on 01/10/24.      Connie Emperor, MD     [1]  Social History Tobacco Use   Smoking status: Every Day    Current packs/day: 1.00    Average packs/day: 1 pack/day for 28.0 years (28.0 ttl pk-yrs)    Types: Cigarettes   Smokeless tobacco: Never  Vaping Use   Vaping status: Never Used  Substance Use Topics   Alcohol use: Yes    Alcohol/week: 14.0 standard drinks of alcohol    Types: 14 Standard drinks or equivalent per week    Comment: occ   Drug use: No  [2]  Allergies Allergen Reactions   Almond  Meal (Obsolete) Anaphylaxis and Swelling   Other Anaphylaxis    Almond Butter   Aspirin  Nausea Only    Other Reaction(s): Not available, Unknown  [3]  Outpatient Medications Prior to Visit  Medication Sig   albuterol  (VENTOLIN  HFA) 108 (90 Base) MCG/ACT inhaler Inhale 2 puffs into the lungs every 4 (four) hours as needed for wheezing or shortness of breath.   allopurinol  (ZYLOPRIM ) 300 MG tablet Take 1 tablet (300 mg total) by mouth daily.   amLODipine  (NORVASC ) 10 MG tablet Take 1 tablet (10 mg total) by mouth daily.   apixaban  (ELIQUIS ) 5 MG TABS tablet Take 1 tablet (5 mg total) by mouth 2 (two) times daily. Continue taking after starter pack   azelastine  (ASTELIN ) 0.1 % nasal spray Place 2 sprays into both nostrils 2 (two) times daily. Use in each nostril as directed   colchicine  0.6 MG tablet Take 1 tablet (0.6 mg total) by mouth 2 (two) times daily as needed (gout flare).   EPINEPHrine  0.3 mg/0.3 mL IJ SOAJ injection Insert into muscle once with any facial or neck swelling  Instruct in use   escitalopram  (LEXAPRO ) 20 MG tablet Take 1 tablet (20 mg total) by mouth daily.   ferrous sulfate  325 (65 FE) MG EC tablet Take 1 tablet (325 mg total) by mouth daily with breakfast.   folic acid  (FOLVITE ) 1 MG tablet Take 1 tablet (1 mg total) by mouth daily.   hydrochlorothiazide  (HYDRODIURIL ) 25 MG tablet Take 1 tablet (25 mg total) by mouth daily.   hydrOXYzine (ATARAX) 25 MG tablet Take 25 mg by mouth daily as needed.   Lancets (FREESTYLE) lancets Use as instructed   metFORMIN  (GLUCOPHAGE -XR) 500 MG 24 hr tablet Take 2 tablets (1,000 mg total) by mouth daily with supper.   mirtazapine  (REMERON  SOL-TAB) 15 MG disintegrating tablet Take 1 tablet (15 mg total) by mouth at bedtime as needed.   Multiple Vitamin (MULTIVITAMIN WITH MINERALS) TABS tablet Take 1 tablet by mouth daily.   olopatadine  (PATANOL) 0.1 % ophthalmic solution Place 1 drop into both eyes 2 (two) times daily.   pantoprazole   (PROTONIX ) 40 MG tablet Take 1 tablet (40 mg total) by mouth daily.   rosuvastatin  (CRESTOR ) 20 MG tablet Take 1 tablet (20 mg total) by mouth daily.  spironolactone  (ALDACTONE ) 25 MG tablet Take 1 tablet (25 mg total) by mouth daily.   TRELEGY ELLIPTA 200-62.5-25 MCG/ACT AEPB Inhale 1 puff into the lungs daily.   valsartan  (DIOVAN ) 320 MG tablet Take 1 tablet (320 mg total) by mouth daily.   varenicline  (CHANTIX ) 0.5 MG tablet Take 1 tablet (0.5 mg total) by mouth 2 (two) times daily.   VRAYLAR 3 MG capsule Take 3 mg by mouth daily.   APIXABAN  (ELIQUIS ) VTE STARTER PACK (10MG  AND 5MG ) Take as directed on package: start with two-5mg  tablets twice daily for 7 days. On day 8, switch to one-5mg  tablet twice daily. (Patient not taking: Reported on 01/10/2024)   No facility-administered medications prior to visit.   "

## 2024-01-10 NOTE — Addendum Note (Signed)
 Addended by: ALETHA CONNIE HERO on: 01/10/2024 11:47 AM   Modules accepted: Level of Service

## 2024-01-11 ENCOUNTER — Ambulatory Visit: Payer: Self-pay | Admitting: Family Medicine

## 2024-01-11 LAB — COMPREHENSIVE METABOLIC PANEL WITH GFR
AG Ratio: 1 (calc) (ref 1.0–2.5)
ALT: 22 U/L (ref 6–29)
AST: 24 U/L (ref 10–35)
Albumin: 3.8 g/dL (ref 3.6–5.1)
Alkaline phosphatase (APISO): 130 U/L (ref 37–153)
BUN/Creatinine Ratio: 34 (calc) — ABNORMAL HIGH (ref 6–22)
BUN: 41 mg/dL — ABNORMAL HIGH (ref 7–25)
CO2: 22 mmol/L (ref 20–32)
Calcium: 9.6 mg/dL (ref 8.6–10.4)
Chloride: 101 mmol/L (ref 98–110)
Creat: 1.21 mg/dL — ABNORMAL HIGH (ref 0.50–1.03)
Globulin: 3.9 g/dL — ABNORMAL HIGH (ref 1.9–3.7)
Glucose, Bld: 169 mg/dL — ABNORMAL HIGH (ref 65–99)
Potassium: 3.9 mmol/L (ref 3.5–5.3)
Sodium: 133 mmol/L — ABNORMAL LOW (ref 135–146)
Total Bilirubin: 0.4 mg/dL (ref 0.2–1.2)
Total Protein: 7.7 g/dL (ref 6.1–8.1)
eGFR: 52 mL/min/1.73m2 — ABNORMAL LOW

## 2024-01-11 LAB — LIPID PANEL
Cholesterol: 177 mg/dL
HDL: 76 mg/dL
LDL Cholesterol (Calc): 73 mg/dL
Non-HDL Cholesterol (Calc): 101 mg/dL
Total CHOL/HDL Ratio: 2.3 (calc)
Triglycerides: 186 mg/dL — ABNORMAL HIGH

## 2024-01-11 LAB — CBC WITH DIFFERENTIAL/PLATELET
Absolute Lymphocytes: 2907 {cells}/uL (ref 850–3900)
Absolute Monocytes: 664 {cells}/uL (ref 200–950)
Basophils Absolute: 40 {cells}/uL (ref 0–200)
Basophils Relative: 0.5 %
Eosinophils Absolute: 119 {cells}/uL (ref 15–500)
Eosinophils Relative: 1.5 %
HCT: 30 % — ABNORMAL LOW (ref 35.9–46.0)
Hemoglobin: 9.8 g/dL — ABNORMAL LOW (ref 11.7–15.5)
MCH: 29.2 pg (ref 27.0–33.0)
MCHC: 32.7 g/dL (ref 31.6–35.4)
MCV: 89.3 fL (ref 81.4–101.7)
MPV: 10.8 fL (ref 7.5–12.5)
Monocytes Relative: 8.4 %
Neutro Abs: 4171 {cells}/uL (ref 1500–7800)
Neutrophils Relative %: 52.8 %
Platelets: 289 Thousand/uL (ref 140–400)
RBC: 3.36 Million/uL — ABNORMAL LOW (ref 3.80–5.10)
RDW: 15.3 % — ABNORMAL HIGH (ref 11.0–15.0)
Total Lymphocyte: 36.8 %
WBC: 7.9 Thousand/uL (ref 3.8–10.8)

## 2024-01-11 LAB — TSH: TSH: 1.78 m[IU]/L (ref 0.40–4.50)

## 2024-01-11 LAB — HEMOGLOBIN A1C
Hgb A1c MFr Bld: 9.1 % — ABNORMAL HIGH
Mean Plasma Glucose: 214 mg/dL
eAG (mmol/L): 11.9 mmol/L

## 2024-01-12 NOTE — Telephone Encounter (Signed)
 Copied from CRM #[id number]. Topic: Clinical - Lab/Test Results >> Jan 12, 2024 11:16 AM Winona R wrote: Pt returned the office call. And understands her labs >> Jan 12, 2024 11:20 AM Winona R wrote: Pt states she is currently taking Metformin  2 tablets of 500mg  per day @night 

## 2024-01-12 NOTE — Progress Notes (Signed)
 Lvm for pt to return call

## 2024-01-13 ENCOUNTER — Encounter: Payer: Self-pay | Admitting: Oncology

## 2024-01-13 NOTE — Assessment & Plan Note (Signed)
 On 10/28/2023, bilateral lower extremity ultrasound was obtained.  It showed acute DVT involving the tibioperoneal confluence in the right lower extremity.  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  No evidence of DVT in the left lower extremity.   Acute DVT in the right lower extremity with no recent surgeries, trauma, or prolonged immobility.   Previous DVT in 2023 associated with a foot fracture.    Current DVT lacks a clear trigger, necessitating further investigation for potential thrombophilia.  On her consultation with us  on 11/30/2023, we obtained additional workup.  There was no evidence of anticardiolipin antibodies or beta-2  glycoprotein antibodies.  Factor V Leiden mutation and prothrombin gene mutation were both negative.  D-dimer was 0.62.  Request placed for repeat ultrasound of the right lower extremity before return visit.  Further treatment issues will be dependent on findings.  - Continue Eliquis  5 mg twice daily.  - Plan for a follow-up ultrasound before the next visit.  - Scheduled follow-up appointment in four months.

## 2024-01-14 ENCOUNTER — Other Ambulatory Visit: Payer: Self-pay

## 2024-01-14 MED ORDER — FOLIC ACID 1 MG PO TABS: 1.0000 mg | ORAL_TABLET | Freq: Every day | ORAL | 1 refills | Status: AC

## 2024-01-14 MED ORDER — METFORMIN HCL ER 500 MG PO TB24: 2000.0000 mg | ORAL_TABLET | Freq: Every day | ORAL | 1 refills | Status: AC

## 2024-01-14 NOTE — Progress Notes (Signed)
 Pt informed and verbalized understanding

## 2024-01-17 LAB — PAP, TP IMAGING W/ HPV RNA, RFLX HPV TYPE 16,18/45: HPV DNA High Risk: NOT DETECTED

## 2024-02-02 ENCOUNTER — Ambulatory Visit (HOSPITAL_COMMUNITY): Payer: Self-pay

## 2024-02-02 DIAGNOSIS — F321 Major depressive disorder, single episode, moderate: Secondary | ICD-10-CM

## 2024-02-02 DIAGNOSIS — F411 Generalized anxiety disorder: Secondary | ICD-10-CM

## 2024-02-09 NOTE — Progress Notes (Unsigned)
 Comprehensive Clinical Assessment (CCA) Note  02/09/2024 Stacy [name] [id number]  Chief Complaint:  Chief Complaint  Patient presents with   Stress    Pt reports persistent feelings of having lost herself and describes ongoing worry that something bad will happen. She attributes these feelings, in part, to the traumatic experience of finding her husband deceased from a heart attack.   Depression    Pt describes significant depressive symptoms, which were exacerbated by the loss of her job.   Anxiety    Pt endorses chronic, excessive worry, particularly about the safety and well-being of her son and grandsons.   Visit Diagnosis: ***    CCA Screening, Triage and Referral (STR)  Patient Reported Information How did you hear about us ? Primary Care  Referral name: Connie Emperor, MD  Referral phone number: [phone]   Whom do you see for routine medical problems? Primary Care  Practice/Facility Name: [facility]  Practice/Facility Phone Number: [phone]  Name of Contact: No data recorded Contact Number: No data recorded Contact Fax Number: No data recorded Prescriber Name: No data recorded Prescriber Address (if known): No data recorded  What Is the Reason for Your Visit/Call Today? Need to concentrate and function clearly  How Long Has This Been Causing You Problems? > than 6 months (seen therapist for depression and anxiety and childhood trauma in the past ([facility]  What Do You Feel Would Help You the Most Today? Treatment for Depression or other mood problem; Medication(s); Stress Management   Have You Recently Been in Any Inpatient Treatment (Hospital/Detox/Crisis Center/28-Day Program)? No data recorded Name/Location of Program/Hospital:No data recorded How Long Were You There? No data recorded When Were You Discharged? No data recorded  Have You Ever Received Services From [facility]  Before? Yes  Who Do You See at [facility]? [facility] -Connie Emperor, MD   Have You Recently Had Any Thoughts About Hurting Yourself? No  Are You Planning to Commit Suicide/Harm Yourself At This time? No   Have you Recently Had Thoughts About Hurting Someone [place]? No  Explanation: No data recorded  Have You Used Any Alcohol or Drugs in the Past 24 Hours? No  How Long Ago Did You Use Drugs or Alcohol? No data recorded What Did You Use and How Much? No data recorded  Do You Currently Have a Therapist/Psychiatrist? No (psychiatric appointment set)  Name of Therapist/Psychiatrist: No data recorded  Have You Been Recently Discharged From Any Office Practice or Programs? No data recorded Explanation of Discharge From Practice/Program: No data recorded    CCA Screening Triage Referral Assessment Type of Contact: Face-to-Face  Is this Initial or Reassessment? No data recorded Date Telepsych consult ordered in [facility]:  No data recorded Time Telepsych consult ordered in [facility]:  No data recorded  Patient Reported Information Reviewed? No data recorded Patient Left Without Being Seen? No data recorded Reason for Not Completing Assessment: No data recorded  Collateral Involvement: No data recorded  Does Patient Have a Court Appointed Legal Guardian? No data recorded Name and Contact of Legal Guardian: No data recorded If Minor and Not Living with Parent(s), Who has Custody? No data recorded Is CPS involved or ever been involved? No data recorded Is APS involved or ever been involved? No data recorded  Patient Determined To Be At Risk for Harm To Self or Others Based on Review of Patient Reported Information or Presenting Complaint? No  Method: No Plan  Availability of Means: No access or NA  Intent: Vague intent or NA  Notification Required: No need or identified person  Additional Information for Danger to Others Potential: No data  recorded Additional Comments for Danger to Others Potential: No data recorded Are There Guns or Other Weapons in Your Home? No  Types of Guns/Weapons: No data recorded Are These Weapons Safely Secured?                            No data recorded Who Could Verify You Are Able To Have These Secured: No data recorded Do You Have any Outstanding Charges, Pending Court Dates, Parole/Probation? No data recorded Contacted To Inform of Risk of Harm To Self or Others: No data recorded  Location of Assessment: Other (comment) ([facility] [place])   Does Patient Present under Involuntary Commitment? No data recorded IVC Papers Initial File Date: No data recorded  [place] of Residence: [place]   Patient Currently Receiving the Following Services: Not Receiving Services (applying for disability)   Determination of Need: No data recorded  Options For Referral: No data recorded    CCA Biopsychosocial Intake/Chief Complaint:  Pt utilizes coping strategies for depression and anxiety, reflects on past relationships, and seeks greater spiritual connection and faith as part of her recovery process  Current Symptoms/Problems: Pt reports forgetfulness, difficulty concentrating, persistent fatigue requiring her to lie down, and low energy levels   Patient Reported Schizophrenia/Schizoaffective Diagnosis in Past: No   Strengths: Pt is professional, demonstrates a strong work associate professor, is loyal, and consistently willing to contribute and help others (her identity was her work, my children and husband)  Preferences: Pt values spiritual connection and faith, which serve as important sources of comfort and resilience in coping with current challenges  Abilities: Pt abilities include public speaking, data collection and analysis, creating documents, grant applications, and leadership abilities   Type of Services Patient Feels are Needed: Individual therapy and medication management to address the  patients symptoms and support her overall mental health   Initial Clinical Notes/Concerns: Major Depressive Disorder (MDD) and Generalized Anxiety Disorder (GAD). Pt is applying for disability due to ongoing health concerns and functional limitations related to her symptoms, and seeks assistance in managing interactions with difficult relatives. (Felt like last therapist was a checklist)   Mental Health Symptoms Depression:  Change in energy/activity; Difficulty Concentrating; Fatigue; Hopelessness; Worthlessness; Increase/decrease in appetite; Irritability; Weight gain/loss (Pt doesn't have an appitite and has experienced weight loss)   Duration of Depressive symptoms: Greater than two weeks   Mania:  None   Anxiety:   Worrying; Sleep (staying asleep)   Psychosis:  No data recorded  Duration of Psychotic symptoms: No data recorded  Trauma:  Detachment from others; Irritability/anger; Guilt/shame; Emotional numbing (inappropriate touching from granddad does not get along with older sister)   Obsessions:  None   Compulsions:  None   Inattention:  Fails to pay attention/makes careless mistakes; Forgetful; Loses things (confusion)   Hyperactivity/Impulsivity:  Fidgets with hands/feet   Oppositional/Defiant Behaviors:  None   Emotional Irregularity:  None   Other Mood/Personality Symptoms:  family members alcoholic and dealing with drug addition, sister wanted money from [employer]    Mental Status Exam Appearance and self-care  Stature:  No data recorded  Weight:  No data recorded  Clothing:  No data recorded  Grooming:  No data recorded  Cosmetic use:  No data recorded  Posture/gait:  No  data recorded  Motor activity:  No data recorded  Sensorium  Attention:  No data recorded  Concentration:  No data recorded  Orientation:  No data recorded  Recall/memory:  No data recorded  Affect and Mood  Affect:  No data recorded  Mood:  No data recorded  Relating   Eye contact:  No data recorded  Facial expression:  No data recorded  Attitude toward examiner:  No data recorded  Thought and Language  Speech flow: No data recorded  Thought content:  No data recorded  Preoccupation:  No data recorded  Hallucinations:  No data recorded  Organization:  No data recorded  [employer] of Knowledge:  No data recorded  Intelligence:  No data recorded  Abstraction:  No data recorded  Judgement:  No data recorded  Reality Testing:  No data recorded  Insight:  No data recorded  Decision Making:  No data recorded  Social Functioning  Social Maturity:  No data recorded  Social Judgement:  No data recorded  Stress  Stressors:  Family conflict; Grief/losses; Illness; Transitions; Other (Comment) (navigating wants her house back)   Coping Ability:  No data recorded  Skill Deficits:  No data recorded  Supports:  Family; Friends/Service system (son, God sister, Dad)     Religion: Religion/Spirituality Are You A Religious Person?: Yes What is Your Religious Affiliation?: Environmental Consultant: Leisure / Recreation Do You Have Hobbies?: Yes Leisure and Hobbies: scrapbooking, phone games, reality shows  Exercise/Diet: Exercise/Diet Do You Exercise?: No Have You Gained or Lost A Significant Amount of Weight in the Past Six Months?: Yes-Lost Number of Pounds Lost?: 7 Do You Follow a Special Diet?: No Do You Have Any Trouble Sleeping?: Yes Explanation of Sleeping Difficulties: staying asleep   CCA Employment/Education Employment/Work Situation: Employment / Work Situation Employment Situation: Unemployed (filing disability) Patient's Job has Been Impacted by Current Illness: No What is the Longest Time Patient has Held a Job?: 17 Where was the Patient Employed at that Time?: [employer]) before then [employer] Has Patient ever Been in the Military?: No  Education: Education Is Patient Currently  Attending School?: No Did Garment/textile Technologist From [employer]?: Yes Did You Attend College?: Yes What Type of College Degree Do you Have?: Associate in It Sales Professional, and BA in Public Health Did You Attend Graduate School?: Yes (never went back) 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 The youngest good relationship. The oldest not so good.  Childhood History:  Childhood History By whom was/is the patient raised?: Mother (They were both in her life and had a step dad) Description of patient's relationship with caregiver when they were a child: Mom did the best she could (okay) different. Relationship with Dad still good. Patient's description of current relationship with people who raised him/her: Dad and I are really close. Mom deceased. How were you disciplined when you got in trouble as a child/adolescent?: yelled, punishment, spanked Does patient have siblings?: Yes Number of Siblings: 8 Description of patient's current relationship with siblings: pretty good with everyone but the oldest sister Did patient suffer any verbal/emotional/physical/sexual abuse as  a child?: Yes Did patient suffer from severe childhood neglect?: No Has patient ever been sexually abused/assaulted/raped as an adolescent or adult?: No Was the patient ever a victim of a crime or a disaster?: No Witnessed domestic violence?: Yes Has patient been affected by domestic violence as an adult?: No  Child/Adolescent Assessment:     CCA Substance Use Alcohol/Drug Use: Alcohol / Drug Use Pain  Medications: N/A Prescriptions: Vraylar, Prosac, and Remeron  Over the Counter: N/A History of alcohol / drug use?: Yes (want to gradually stop all together) Negative Consequences of Use: Financial (get into feeling not the nicest person to be around) Substance #1 Name of Substance 1: Rum 1 - Age of First Use: 24 1 - Amount (size/oz): 2 drinks a day 1 - Frequency: Per day 1 - Last Use / Amount: Saturday 1 - Method of Aquiring: [employer]                       ASAM's:  Six Dimensions of Multidimensional Assessment  Dimension 1:  Acute Intoxication and/or Withdrawal Potential:      Dimension 2:  Biomedical Conditions and Complications:      Dimension 3:  Emotional, Behavioral, or Cognitive Conditions and Complications:     Dimension 4:  Readiness to Change:     Dimension 5:  Relapse, Continued use, or Continued Problem Potential:     Dimension 6:  Recovery/Living Environment:     ASAM Severity Score:    ASAM Recommended Level of Treatment:     Substance use Disorder (SUD)    Recommendations for Services/Supports/Treatments:    DSM5 Diagnoses: Patient Active Problem List   Diagnosis Date Noted   Acute deep vein thrombosis (DVT) of right lower extremity (HCC) 11/30/2023   Cervical cancer screening 08/13/2022   Allergic rhinitis with postnasal drip 08/13/2022   Pure hypercholesterolemia 02/27/2022   Moderate episode of recurrent major depressive disorder (HCC) 02/26/2022   Coronary artery disease of native artery of native heart with stable angina pectoris 02/26/2022   Encounter for screening for lung cancer 02/26/2022   Esophageal stricture 12/23/2021   History of DVT (deep vein thrombosis) 12/19/2021   Polyarthritis with positive rheumatoid factor (HCC) 02/17/2021   Encounter for screening mammogram for malignant neoplasm of breast 02/04/2021   Synovitis of left knee    Anemia 12/19/2020   Epidermoid cyst of ear 11/23/2019   Renal artery stenosis  08/02/2019   Adenoma of right adrenal gland 01/16/2019   Coronary artery calcification seen on CAT scan 04/12/2018   Insomnia 01/01/2015   Fatty liver 01/01/2015   Thyroid  nodule  managed Dr. Tobie 02/27/2014   EtOH dependence (HCC) 11/22/2013   Tobacco use 10/04/2013   Colon polyp 08/23/2013   Migraines 08/23/2013   GERD (gastroesophageal reflux disease)    Type 2 diabetes mellitus with other specified complication (HCC)    Gout    Hypertension     Patient Centered Plan: Patient is on the following Treatment Plan(s):  {[facility] AMB BH OP Treatment Plans:[id number]}   Referrals to Alternative Service(s): Referred to Alternative Service(s):   Place:   Date:   Time:    Referred to Alternative Service(s):   Place:   Date:   Time:    Referred to Alternative Service(s):   Place:   Date:   Time:    Referred to Alternative Service(s):   Place:   Date:   Time:      Collaboration of Care: {BH OP Collaboration of  Rjmz:[id number]}  Patient/Guardian was advised Release of Information must be obtained prior to any record release in order to collaborate their care with an outside provider. Patient/Guardian was advised if they have not already done so to contact the registration department to sign all necessary forms in order for us  to release information regarding their care.   Consent: Patient/Guardian gives verbal consent for treatment and assignment of benefits for services provided during this visit. Patient/Guardian expressed understanding and agreed to proceed.   Karrie Fluellen, LCMHC

## 2024-02-10 ENCOUNTER — Encounter (HOSPITAL_COMMUNITY): Payer: Self-pay

## 2024-02-10 ENCOUNTER — Ambulatory Visit (HOSPITAL_COMMUNITY)

## 2024-02-10 DIAGNOSIS — F321 Major depressive disorder, single episode, moderate: Secondary | ICD-10-CM

## 2024-02-10 DIAGNOSIS — F411 Generalized anxiety disorder: Secondary | ICD-10-CM

## 2024-02-10 NOTE — Progress Notes (Signed)
 "  THERAPIST PROGRESS NOTE  Virtual Visit via Video Note  I connected with Stacy [name] on 02/10/24 at  3:00 PM EST by a video enabled telemedicine application and verified that I am speaking with the correct person using two identifiers.  Location: Patient: Home  Provider: Home Office   I discussed the limitations of evaluation and management by telemedicine and the availability of in person appointments. The patient expressed understanding and agreed to proceed.   I discussed the assessment and treatment plan with the patient. The patient was provided an opportunity to ask questions and all were answered. The patient agreed with the plan and demonstrated an understanding of the instructions.   The patient was advised to call back or seek an in-person evaluation if the symptoms worsen or if the condition fails to improve as anticipated.  I provided 57 minutes of non-face-to-face time during this encounter.   Cavan Bearden, [facility]   Session Time: 3:10 PM - 4:07 PM  Participation Level: Active  Behavioral Response: Neat, Alert, Irritable  Type of Therapy: Individual Therapy  Treatment Goals addressed: The primary treatment goals addressed in therapy focus on helping Stacy [name] re-establish healthy boundaries within her home, particularly with her son and daughter-in-law, to restore her sense of peace and ownership of her space. Stacy [name] is working to prioritize her own well-being by implementing consistent self-care routines and addressing her physical health needs, such as improving sleep and managing her rotator cuff injury. Additionally, therapy supports the development of assertive communication skills so she can clearly express her needs and limits regarding caregiving and household responsibilities. By shifting from the role of a primary caregiver back to a supportive grandparent, she aims to reduce stress and emotional overwhelm, empowering her to maintain meaningful family  relationships while protecting her own health and autonomy.  ProgressTowards Goals: Initial  Interventions: CBT, Supportive, and Other: Psychoeducational on boundaries and assertive communication  Summary: Stacy [name] is a 59 y.o. female who presents with significant emotional distress, role overload, and a perceived loss of autonomy within her own home. Currently, her adult son, daughter-in-law, and 29-year-old grandson are residing with her, a situation that was intended to be temporary but has extended past agreed-upon deadlines. She reports a profound loss of personal peace, stating her home no longer feels like her home as her son has failed to take responsibility for his familys transition to independent living.  She is experiencing physical and mental exhaustion due to acting as a primary caregiver for her grandson, which includes a 70-minute round-trip commute for his schooling and disrupted sleep. Her sleep quality is severely impacted by the grandson co-sleeping with her, which also exacerbates a painful rotator cuff injury. Recent interpersonal conflict with her daughter-in-law has further strained the household dynamic. While she deeply values her relationship with her grandson, she feels exploited by her sons demands and her own inability to maintain boundaries.  Therapeutic efforts are focused on assertiveness training and the establishment of firm boundaries to help her reclaim her living space and prioritize her physical recovery and self-care. The clinical goal is to support Stacy [name] in transitioning from a primary caregiver role back to a supportive grandparent role, while empowering her to hold her son accountable for his family's relocation and daily responsibilities..   Suicidal/Homicidal: No without intent/plan  Therapist Response: The therapist provided validation and support for Stacy [name] feelings of overwhelm and loss of autonomy in her home environment. Psychoeducation  was offered on the importance of setting healthy  boundaries and prioritizing self-care, especially given her current physical and emotional challenges. The therapist facilitated values clarification and helped Stacy [name] identify specific boundaries to communicate with her son and daughter-in-law. Assertiveness skills were modeled and practiced through role-play, enabling her to express her needs more confidently. The therapist encouraged Stacy [name] to implement a self-care routine and discussed practical strategies for managing caregiving responsibilities, including exploring alternative arrangements for her grandsons care and school transportation. Ongoing emotional support and positive reinforcement were provided to empower Stacy [name] as she works toward reclaiming her peace, well-being, and sense of control in her home.  Plan: Return again in 1 weeks.  Diagnosis: Current moderate episode of major depressive disorder without prior episode (HCC)  Generalized anxiety disorder  Collaboration of Care: Other None  Patient/Guardian was advised Release of Information must be obtained prior to any record release in order to collaborate their care with an outside provider. Patient/Guardian was advised if they have not already done so to contact the registration department to sign all necessary forms in order for us  to release information regarding their care.   Consent: Patient/Guardian gives verbal consent for treatment and assignment of benefits for services provided during this visit. Patient/Guardian expressed understanding and agreed to proceed.   Jaber Dunlow, [facility] 02/10/2024  "

## 2024-02-16 ENCOUNTER — Ambulatory Visit: Admitting: Orthopedic Surgery

## 2024-02-17 ENCOUNTER — Ambulatory Visit (HOSPITAL_COMMUNITY)

## 2024-02-17 DIAGNOSIS — F411 Generalized anxiety disorder: Secondary | ICD-10-CM

## 2024-02-17 DIAGNOSIS — F321 Major depressive disorder, single episode, moderate: Secondary | ICD-10-CM

## 2024-02-17 NOTE — Progress Notes (Signed)
" ° °  THERAPIST PROGRESS NOTE  Virtual Visit via Video Note  I connected with [name] on 02/17/24 at  3:00 PM EST by a video enabled telemedicine application and verified that I am speaking with the correct person using two identifiers.  Location: Patient: Home Provider: Home Office   I discussed the limitations of evaluation and management by telemedicine and the availability of in person appointments. The patient expressed understanding and agreed to proceed.   I discussed the assessment and treatment plan with the patient. The patient was provided an opportunity to ask questions and all were answered. The patient agreed with the plan and demonstrated an understanding of the instructions.   The patient was advised to call back or seek an in-person evaluation if the symptoms worsen or if the condition fails to improve as anticipated.  I provided 62 minutes of non-face-to-face time during this encounter.   Rossy Virag, [facility]   Session Time: 3:00 PM - 4:02 PM  Participation Level: Active  Behavioral Response: Neat, Alert, Depressed  Type of Therapy: Individual Therapy  Treatment Goals addressed: During the session, treatment goals focused on helping Stacy [name] process grief related to the loss of her husband, as well as identify and express emotions such as anger and sadness. The therapist supported the development of healthy coping strategies for managing symptoms of depression and anxiety. Alpha was encouraged to be open to building new habits and to practice reframing negative thoughts. Self-care was emphasized, and journaling was introduced as a therapeutic tool to aid in emotional processing and overall well-being.  ProgressTowards Goals: Progressing  Interventions: CBT, Supportive, and Reframing  Summary: Stacy [name] is a 59 y.o. female who presents with Major Depressive Disorder and Generalized Anxiety Disorder. She is grieving the loss of her husband, with whom  she shared a 25-year marriage and longstanding emotional security. Her family history includes trauma and emotional neglect, contributing to chronic fears of rejection and difficulty with trust. She identifies as the middle child who received less attention and lived with high expectations.  Currently, she experiences significant sadness, anger, frustration, and feelings of being overwhelmed. She reports excessive sleep, difficulty completing daily tasks, and reliance on alcohol for coping. She describes her grief as being in the anger and depression stages, and expresses a desire to build new habits and reframe her thoughts..   Suicidal/Homicidal: No without intent/plan  Therapist Response: Therapist validated [name]'s feelings of anger and sadness related to her husbands death and acknowledged the impact of her family history on current coping. Therapist encouraged her openness to new habits and reframing thoughts, and supported use of journaling and coping strategies to process grief and improve well-being.  Plan: Return again in 1 weeks.  Diagnosis: Current moderate episode of major depressive disorder without prior episode (HCC)  Generalized anxiety disorder  Collaboration of Care: Other None.  Patient/Guardian was advised Release of Information must be obtained prior to any record release in order to collaborate their care with an outside provider. Patient/Guardian was advised if they have not already done so to contact the registration department to sign all necessary forms in order for us  to release information regarding their care.   Consent: Patient/Guardian gives verbal consent for treatment and assignment of benefits for services provided during this visit. Patient/Guardian expressed understanding and agreed to proceed.   Joeline Freer, [facility] 02/17/2024  "

## 2024-02-24 ENCOUNTER — Ambulatory Visit (HOSPITAL_COMMUNITY)

## 2024-03-08 ENCOUNTER — Ambulatory Visit: Admitting: Orthopedic Surgery

## 2024-03-09 ENCOUNTER — Ambulatory Visit (HOSPITAL_COMMUNITY)

## 2024-03-16 ENCOUNTER — Ambulatory Visit (HOSPITAL_COMMUNITY)

## 2024-03-21 ENCOUNTER — Other Ambulatory Visit (HOSPITAL_BASED_OUTPATIENT_CLINIC_OR_DEPARTMENT_OTHER)

## 2024-03-23 ENCOUNTER — Ambulatory Visit (HOSPITAL_COMMUNITY)

## 2024-03-27 ENCOUNTER — Ambulatory Visit (HOSPITAL_COMMUNITY): Admitting: Family

## 2024-03-28 ENCOUNTER — Inpatient Hospital Stay

## 2024-03-28 ENCOUNTER — Inpatient Hospital Stay: Admitting: Oncology

## 2024-04-10 ENCOUNTER — Ambulatory Visit: Admitting: Family Medicine
